# Patient Record
Sex: Male | Born: 1945
Health system: Southern US, Community
[De-identification: ages and names within clinical notes are randomized; demographics above are authoritative.]

## PROBLEM LIST (undated history)

## (undated) DIAGNOSIS — I619 Nontraumatic intracerebral hemorrhage, unspecified: Secondary | ICD-10-CM

## (undated) DIAGNOSIS — N183 Chronic kidney disease, stage 3 unspecified: Secondary | ICD-10-CM

## (undated) DIAGNOSIS — E785 Hyperlipidemia, unspecified: Secondary | ICD-10-CM

## (undated) DIAGNOSIS — D696 Thrombocytopenia, unspecified: Secondary | ICD-10-CM

## (undated) DIAGNOSIS — I10 Essential (primary) hypertension: Secondary | ICD-10-CM

## (undated) DIAGNOSIS — R002 Palpitations: Secondary | ICD-10-CM

## (undated) DIAGNOSIS — R011 Cardiac murmur, unspecified: Secondary | ICD-10-CM

## (undated) HISTORY — DX: Hyperlipidemia, unspecified: E78.5

## (undated) HISTORY — DX: Thrombocytopenia, unspecified: D69.6

## (undated) HISTORY — DX: Palpitations: R00.2

## (undated) HISTORY — DX: Chronic kidney disease, stage 3 unspecified: N18.30

## (undated) HISTORY — DX: Cardiac murmur, unspecified: R01.1

## (undated) HISTORY — DX: Essential (primary) hypertension: I10

## (undated) HISTORY — DX: Nontraumatic intracerebral hemorrhage, unspecified: I61.9

## (undated) HISTORY — PX: COLONOSCOPY: SHX174

---

## 1981-03-22 HISTORY — PX: LAPAROSCOPIC APPENDECTOMY: SUR753

## 1996-08-19 ENCOUNTER — Encounter: Payer: Self-pay | Admitting: Family Medicine

## 1996-08-19 LAB — CONVERTED CEMR LAB: PSA: 0.9 ng/mL

## 1998-05-21 ENCOUNTER — Encounter: Payer: Self-pay | Admitting: Family Medicine

## 1998-05-21 LAB — CONVERTED CEMR LAB: PSA: 1 ng/mL

## 1999-09-20 ENCOUNTER — Encounter: Payer: Self-pay | Admitting: Family Medicine

## 1999-09-20 LAB — CONVERTED CEMR LAB: PSA: 0.7 ng/mL

## 2000-09-19 ENCOUNTER — Encounter: Payer: Self-pay | Admitting: Family Medicine

## 2000-09-19 LAB — CONVERTED CEMR LAB: PSA: 0.6 ng/mL

## 2001-01-10 ENCOUNTER — Emergency Department (HOSPITAL_COMMUNITY): Admission: EM | Admit: 2001-01-10 | Discharge: 2001-01-10 | Payer: Self-pay | Admitting: Emergency Medicine

## 2001-01-10 ENCOUNTER — Encounter: Payer: Self-pay | Admitting: Emergency Medicine

## 2001-03-20 ENCOUNTER — Encounter: Payer: Self-pay | Admitting: Cardiology

## 2001-03-20 ENCOUNTER — Ambulatory Visit (HOSPITAL_COMMUNITY): Admission: RE | Admit: 2001-03-20 | Discharge: 2001-03-20 | Payer: Self-pay | Admitting: Cardiology

## 2001-03-20 HISTORY — PX: CARDIAC CATHETERIZATION: SHX172

## 2002-11-20 ENCOUNTER — Encounter: Payer: Self-pay | Admitting: Family Medicine

## 2002-11-20 LAB — CONVERTED CEMR LAB: PSA: 0.5 ng/mL

## 2003-07-13 LAB — HM COLONOSCOPY

## 2005-08-02 ENCOUNTER — Ambulatory Visit: Payer: Self-pay | Admitting: Family Medicine

## 2005-12-26 ENCOUNTER — Ambulatory Visit: Payer: Self-pay | Admitting: Family Medicine

## 2005-12-26 LAB — CONVERTED CEMR LAB: PSA: 0.84 ng/mL

## 2006-01-01 ENCOUNTER — Ambulatory Visit: Payer: Self-pay | Admitting: Family Medicine

## 2006-02-01 ENCOUNTER — Ambulatory Visit: Payer: Self-pay | Admitting: Family Medicine

## 2006-04-09 HISTORY — PX: INGUINAL HERNIA REPAIR: SHX194

## 2006-05-28 ENCOUNTER — Ambulatory Visit: Payer: Self-pay | Admitting: Family Medicine

## 2006-08-27 ENCOUNTER — Ambulatory Visit: Payer: Self-pay | Admitting: Family Medicine

## 2006-10-21 LAB — HM SIGMOIDOSCOPY

## 2007-01-08 ENCOUNTER — Encounter: Payer: Self-pay | Admitting: Family Medicine

## 2007-01-08 DIAGNOSIS — E781 Pure hyperglyceridemia: Secondary | ICD-10-CM

## 2007-01-08 DIAGNOSIS — K5190 Ulcerative colitis, unspecified, without complications: Secondary | ICD-10-CM | POA: Insufficient documentation

## 2007-01-09 ENCOUNTER — Ambulatory Visit: Payer: Self-pay | Admitting: Family Medicine

## 2007-01-09 LAB — CONVERTED CEMR LAB
ALT: 20 units/L (ref 0–53)
AST: 21 units/L (ref 0–37)
Albumin: 3.8 g/dL (ref 3.5–5.2)
Alkaline Phosphatase: 72 units/L (ref 39–117)
BUN: 13 mg/dL (ref 6–23)
Bilirubin, Direct: 0.2 mg/dL (ref 0.0–0.3)
CO2: 28 meq/L (ref 19–32)
Calcium: 8.9 mg/dL (ref 8.4–10.5)
Chloride: 109 meq/L (ref 96–112)
Cholesterol: 140 mg/dL (ref 0–200)
Creatinine, Ser: 1.1 mg/dL (ref 0.4–1.5)
GFR calc Af Amer: 87 mL/min
GFR calc non Af Amer: 72 mL/min
Glucose, Bld: 103 mg/dL — ABNORMAL HIGH (ref 70–99)
HDL: 25.3 mg/dL — ABNORMAL LOW (ref >39.0)
LDL Cholesterol: 83 mg/dL (ref 0–99)
PSA: 2.04 ng/mL (ref 0.10–4.00)
Potassium: 3.8 meq/L (ref 3.5–5.1)
Sodium: 141 meq/L (ref 135–145)
TSH: 4.03 microintl units/mL (ref 0.35–5.50)
Total Bilirubin: 4.2 mg/dL — ABNORMAL HIGH (ref 0.3–1.2)
Total CHOL/HDL Ratio: 5.5
Total Protein: 6.7 g/dL (ref 6.0–8.3)
Triglycerides: 157 mg/dL — ABNORMAL HIGH (ref 0–149)
VLDL: 31 mg/dL (ref 0–40)

## 2007-01-14 ENCOUNTER — Ambulatory Visit: Payer: Self-pay | Admitting: Family Medicine

## 2007-01-24 ENCOUNTER — Ambulatory Visit: Payer: Self-pay | Admitting: Family Medicine

## 2007-01-27 ENCOUNTER — Encounter (INDEPENDENT_AMBULATORY_CARE_PROVIDER_SITE_OTHER): Payer: Self-pay | Admitting: *Deleted

## 2007-02-27 ENCOUNTER — Ambulatory Visit: Payer: Self-pay | Admitting: Family Medicine

## 2007-03-03 ENCOUNTER — Ambulatory Visit: Payer: Self-pay | Admitting: Family Medicine

## 2007-04-08 ENCOUNTER — Ambulatory Visit: Payer: Self-pay | Admitting: Family Medicine

## 2007-04-08 DIAGNOSIS — I10 Essential (primary) hypertension: Secondary | ICD-10-CM | POA: Insufficient documentation

## 2007-05-22 ENCOUNTER — Ambulatory Visit: Payer: Self-pay | Admitting: Family Medicine

## 2007-06-02 ENCOUNTER — Encounter: Payer: Self-pay | Admitting: Family Medicine

## 2007-12-23 ENCOUNTER — Ambulatory Visit: Payer: Self-pay | Admitting: Family Medicine

## 2007-12-23 LAB — CONVERTED CEMR LAB
ALT: 18 units/L (ref 0–53)
AST: 21 units/L (ref 0–37)
Albumin: 3.7 g/dL (ref 3.5–5.2)
Alkaline Phosphatase: 60 units/L (ref 39–117)
BUN: 12 mg/dL (ref 6–23)
Basophils Absolute: 0 10*3/uL (ref 0.0–0.1)
Basophils Relative: 0.1 % (ref 0.0–3.0)
Bilirubin, Direct: 0.2 mg/dL (ref 0.0–0.3)
CO2: 31 meq/L (ref 19–32)
Calcium: 8.5 mg/dL (ref 8.4–10.5)
Chloride: 104 meq/L (ref 96–112)
Cholesterol: 151 mg/dL (ref 0–200)
Creatinine, Ser: 1.1 mg/dL (ref 0.4–1.5)
Direct LDL: 66.2 mg/dL
Eosinophils Absolute: 0.2 10*3/uL (ref 0.0–0.7)
Eosinophils Relative: 2.8 % (ref 0.0–5.0)
GFR calc Af Amer: 87 mL/min
GFR calc non Af Amer: 72 mL/min
Glucose, Bld: 82 mg/dL (ref 70–99)
HCT: 44.4 % (ref 39.0–52.0)
HDL: 25.4 mg/dL — ABNORMAL LOW (ref >39.0)
Hemoglobin: 15.8 g/dL (ref 13.0–17.0)
Lymphocytes Relative: 17.1 % (ref 12.0–46.0)
MCHC: 35.7 g/dL (ref 30.0–36.0)
MCV: 90.2 fL (ref 78.0–100.0)
Monocytes Absolute: 0.6 10*3/uL (ref 0.1–1.0)
Monocytes Relative: 9.3 % (ref 3.0–12.0)
Neutro Abs: 4.3 10*3/uL (ref 1.4–7.7)
Neutrophils Relative %: 70.7 % (ref 43.0–77.0)
PSA: 1.17 ng/mL (ref 0.10–4.00)
Platelets: 136 10*3/uL — ABNORMAL LOW (ref 150–400)
Potassium: 3.9 meq/L (ref 3.5–5.1)
RBC: 4.92 M/uL (ref 4.22–5.81)
RDW: 12.8 % (ref 11.5–14.6)
Sodium: 140 meq/L (ref 135–145)
TSH: 5.45 microintl units/mL (ref 0.35–5.50)
Total Bilirubin: 2.7 mg/dL — ABNORMAL HIGH (ref 0.3–1.2)
Total CHOL/HDL Ratio: 5.9
Total Protein: 6.6 g/dL (ref 6.0–8.3)
Triglycerides: 289 mg/dL (ref 0–149)
VLDL: 58 mg/dL — ABNORMAL HIGH (ref 0–40)
WBC: 6.2 10*3/uL (ref 4.5–10.5)

## 2007-12-29 ENCOUNTER — Ambulatory Visit: Payer: Self-pay | Admitting: Family Medicine

## 2008-02-10 ENCOUNTER — Encounter: Payer: Self-pay | Admitting: Family Medicine

## 2008-11-18 ENCOUNTER — Telehealth: Payer: Self-pay | Admitting: Family Medicine

## 2008-12-29 ENCOUNTER — Ambulatory Visit: Payer: Self-pay | Admitting: Family Medicine

## 2008-12-30 LAB — CONVERTED CEMR LAB
ALT: 28 units/L (ref 0–53)
BUN: 12 mg/dL (ref 6–23)
CO2: 28 meq/L (ref 19–32)
Chloride: 105 meq/L (ref 96–112)
Cholesterol: 135 mg/dL (ref 0–200)
Creatinine, Ser: 1 mg/dL (ref 0.4–1.5)
Direct LDL: 64.2 mg/dL
Eosinophils Absolute: 0.2 10*3/uL (ref 0.0–0.7)
Eosinophils Relative: 4.1 % (ref 0.0–5.0)
Glucose, Bld: 98 mg/dL (ref 70–99)
HCT: 42.6 % (ref 39.0–52.0)
Lymphs Abs: 1.1 10*3/uL (ref 0.7–4.0)
MCHC: 34.7 g/dL (ref 30.0–36.0)
MCV: 92 fL (ref 78.0–100.0)
Monocytes Absolute: 0.4 10*3/uL (ref 0.1–1.0)
PSA: 0.94 ng/mL (ref 0.10–4.00)
Platelets: 135 10*3/uL — ABNORMAL LOW (ref 150.0–400.0)
Potassium: 4.1 meq/L (ref 3.5–5.1)
TSH: 2.55 microintl units/mL (ref 0.35–5.50)
Total Bilirubin: 1.9 mg/dL — ABNORMAL HIGH (ref 0.3–1.2)
Total Protein: 6.3 g/dL (ref 6.0–8.3)
WBC: 5.1 10*3/uL (ref 4.5–10.5)

## 2009-01-21 ENCOUNTER — Ambulatory Visit: Payer: Self-pay | Admitting: Family Medicine

## 2009-01-21 LAB — CONVERTED CEMR LAB: OCCULT 2: NEGATIVE

## 2009-01-24 ENCOUNTER — Encounter (INDEPENDENT_AMBULATORY_CARE_PROVIDER_SITE_OTHER): Payer: Self-pay | Admitting: *Deleted

## 2009-09-27 ENCOUNTER — Encounter (INDEPENDENT_AMBULATORY_CARE_PROVIDER_SITE_OTHER): Payer: Self-pay | Admitting: *Deleted

## 2009-12-23 ENCOUNTER — Telehealth: Payer: Self-pay | Admitting: Family Medicine

## 2009-12-30 ENCOUNTER — Encounter: Payer: Self-pay | Admitting: Family Medicine

## 2010-01-05 ENCOUNTER — Encounter: Payer: Self-pay | Admitting: Family Medicine

## 2010-01-05 ENCOUNTER — Ambulatory Visit: Payer: Self-pay | Admitting: Family Medicine

## 2010-01-05 DIAGNOSIS — R0789 Other chest pain: Secondary | ICD-10-CM

## 2010-01-05 DIAGNOSIS — R079 Chest pain, unspecified: Secondary | ICD-10-CM | POA: Insufficient documentation

## 2010-02-09 ENCOUNTER — Ambulatory Visit: Payer: Self-pay | Admitting: Family Medicine

## 2010-03-21 NOTE — Assessment & Plan Note (Signed)
Summary: CPX/CLE   Vital Signs:  Patient profile:   65 year old male Height:      67.5 inches Weight:      176.25 pounds BMI:     27.30 Temp:     97.5 degrees F oral Pulse rate:   60 / minute Pulse rhythm:   regular BP sitting:   122 / 82  (left arm) Cuff size:   regular  Vitals Entered By: Sydell Axon LPN (January 05, 2010 8:26 AM) CC: 30 Minute checkup   History of Present Illness: Pt here for Comp Exam. He feels well except his breathing is painful. It is not particularly associated with exertion , he does not think altho he hasn't been doing much lately, and is definitely worse with lying down. He denies wheezing, coughing. he feels some discomfort sitting in front of me now. He ran into the office this morning due tpo being late and had no apparent symptoms. he has rhinitis in the AM, otherwise no sneezing, itchiness, cough. These sxs have been going on for a few months. He had stopoped his regular exercise earlier due to being busy with work, home, remodelling, marriage, etc. Chest discomfort was noticed later and again more intense at night while laying down.  Preventive Screening-Counseling & Management  Alcohol-Tobacco     Alcohol drinks/day: 0     Smoking Status: never     Passive Smoke Exposure: no  Caffeine-Diet-Exercise     Caffeine use/day: 0     Does Patient Exercise: no     Type of exercise: walks, exercises.     Times/week: 1  Problems Prior to Update: 1)  Hypertension, Benign Essential  (ICD-401.1) 2)  Special Screening Malig Neoplasms Other Sites  (ICD-V76.49) 3)  Health Maintenance Exam  (ICD-V70.0) 4)  Cancer, Family Hx  (ICD-V16.9) 5)  Hypertriglyceridemia  (ICD-272.1) 6)  Psa, Increased  (ICD-790.93) 7)  Prostatitis, Hx Of, 03/84 --02/83  (ICD-V13.09) 8)  Ulcerative Colitis  (ICD-556.9) 9)  Gilbert's Syndrome  (ICD-277.4) 10)  Liver Function Tests, Abnormal, Hx of  (ICD-V12.2)  Medications Prior to Update: 1)  Coreg 6.25 Mg Tabs (Carvedilol)  .... One Tab By Mouth Two Times A Day 2)  Niaspan 500 Mg Cr-Tabs (Niacin (Antihyperlipidemic)) .... One Tab By Mouth At Night With 81mg  Easa 1/2 Hr Prior  Current Medications (verified): 1)  Coreg 6.25 Mg Tabs (Carvedilol) .... One Tab By Mouth Two Times A Day 2)  Excedrin Extra Strength 250-250-65 Mg Tabs (Aspirin-Acetaminophen-Caffeine) .... As Needed 3)  Coq10 100 Mg Caps (Coenzyme Q10) .... Take One By Mouth Daily 4)  Vitamin C 1000 Mg Tabs (Ascorbic Acid) .... Take One By Mouth Daily  Allergies: 1)  ! Sulfa 2)  ! Amoxicillin (Amoxicillin) 3)  ! Morphine Sulfate Cr (Morphine Sulfate)  Past History:  Past Surgical History: Last updated: 12/29/2007 Episode of Ulcerative Colitis 5/81 Remission for years Appendectomy (exp. Lap) 03/1981 Flex sig. 09/98, 05/81. 10/97 Cardiolite EF 55% 02/10/02 CATH  03/20/01 Colonoscopy, int & Ext hemms  (Dr Corinda Gubler) 07/13/03     5 years Bilat Lap inguinal repair 04/09/06  Family History: Last updated: Jan 13, 2007 Father: Died at age 1 of a stroke Mother: Died at age of 52 with gynecological surgery, presumably cancer Siblings: 5 brothers; the oldest has had a catheterization, but otherwise is alive and well.  Two sisters are alive and well.  Social History: Last updated: 01/05/2010 Marital Status: Wife left him...  Remarried Jun 18, 2008 Children: One son Occupation: Is  in the heating and air conditioning business  Risk Factors: Alcohol Use: 0 (01/05/2010) Caffeine Use: 0 (01/05/2010) Exercise: no (01/05/2010)  Risk Factors: Smoking Status: never (01/05/2010) Passive Smoke Exposure: no (01/05/2010)  Social History: Marital Status: Wife left him...  Remarried Jun 18, 2008 Children: One son Occupation: Is in the heating and air conditioning business  Review of Systems General:  Denies chills, fatigue, fever, sweats, weakness, and weight loss. Eyes:  Denies blurring, discharge, eye irritation, and eye pain. ENT:  Denies decreased  hearing, ear discharge, earache, and ringing in ears. CV:  Complains of chest pain or discomfort and difficulty breathing at night; denies fainting, fatigue, leg cramps with exertion, lightheadness, near fainting, palpitations, shortness of breath with exertion, swelling of feet, and swelling of hands. Resp:  Complains of chest discomfort and pleuritic; denies chest pain with inspiration, cough, coughing up blood, excessive snoring, hypersomnolence, morning headaches, shortness of breath, sputum productive, and wheezing. GI:  Complains of constipation and hemorrhoids; denies abdominal pain, bloody stools, change in bowel habits, dark tarry stools, diarrhea, indigestion, loss of appetite, nausea, vomiting, vomiting blood, and yellowish skin color. GU:  Denies discharge, dysuria, incontinence, nocturia, and urinary frequency. MS:  Denies joint pain, low back pain, muscle aches, cramps, and stiffness. Derm:  Denies dryness, itching, and rash. Neuro:  Denies numbness, poor balance, tingling, and tremors.  Physical Exam  General:  Well-developed,well-nourished,in no acute distress; alert,appropriate and cooperative throughout examination, relaxed and comfortable. Head:  Normocephalic and atraumatic without obvious abnormalities. No apparent alopecia but classic male pattern  balding. Eyes:  Conjunctiva clear bilaterally.  Ears:  External ear exam shows no significant lesions or deformities.  Otoscopic examination reveals clear canals, tympanic membranes are intact bilaterally without bulging, retraction, inflammation or discharge. Hearing is grossly normal bilaterally. Nose:  External nasal examination shows no deformity or inflammation. Nasal mucosa are pink and moist without lesions or exudates. Mouth:  Oral mucosa and oropharynx without lesions or exudates.  Teeth in good repair. Neck:  No deformities, masses, or tenderness noted. Chest Wall:  No deformities, masses, tenderness or gynecomastia  noted. Breasts:  No masses or gynecomastia noted Lungs:  Normal respiratory effort, chest expands symmetrically. Lungs are clear to auscultation, no crackles or wheezes. Good air flow apparent. Heart:  Normal rate and regular rhythm. S1 and S2 normal without gallop, murmur, click, rub or other extra sounds. Abdomen:  Bowel sounds positive,abdomen soft and non-tender without masses, organomegaly or hernias noted. Rectal:  No external abnormalities noted. Normal sphincter tone. No rectal masses or tenderness. Deflated ext hemms circumf. G neg. Genitalia:  Testes bilaterally descended without nodularity, tenderness or masses. No scrotal masses or lesions. No penis lesions or urethral discharge. Prostate:  Prostate gland firm and smooth, no enlargement, nodularity, tenderness, mass, asymmetry or induration. 10-20 gms. Msk:  No deformity or scoliosis noted of thoracic or lumbar spine.   Pulses:  R and L carotid,radial,femoral,dorsalis pedis and posterior tibial pulses are full and equal bilaterally Extremities:  No clubbing, cyanosis, edema, or deformity noted with normal full range of motion of all joints.   Neurologic:  No cranial nerve deficits noted. Station and gait are normal. Sensory, motor and coordinative functions appear intact. Skin:  Intact without suspicious lesions or rashes. multiple benign moles and hemangiomas on trunk. Cervical Nodes:  No lymphadenopathy noted Inguinal Nodes:  No significant adenopathy Psych:  Cognition and judgment appear intact. Alert and cooperative with normal attention span and concentration. No apparent delusions, illusions, hallucinations   Impression &  Recommendations:  Problem # 1:  HEALTH MAINTENANCE EXAM (ICD-V70.0)  Reviewed preventive care protocols, scheduled due services, and updated immunizations. Will give pneumovax today and discussed Zostavax.  Problem # 2:  CHEST DISCOMFORT (GUY-403.47) Assessment: New Sounds pulmonary initially not  cardiac. EKG signif for bradycardia and inverted T in III, unable to compare with old EKG as it is faded. Lungs sound perfectly nml with good air exchange. CXR does not seem important at this point. Possibly get one in the future. Will decrease Coreg to 1/2 two times a day to see if increasing heartrate may help. Will see back in one month. Orders: EKG w/ Interpretation (93000)  Problem # 3:  HYPERTENSION, BENIGN ESSENTIAL (ICD-401.1) Assessment: Improved Cont curr meds. His updated medication list for this problem includes:    Coreg 6.25 Mg Tabs (Carvedilol) ..... One tab by mouth two times a day  BP today: 122/82 Prior BP: 150/84 (12/29/2008)  Labs Reviewed: K+: 4.1 (12/29/2008) Creat: : 1.0 (12/29/2008)   Chol: 135 (12/29/2008)   HDL: 26.40 (12/29/2008)   LDL: DEL (12/23/2007)   TG: 219.0 (12/29/2008)  Problem # 4:  HYPERTRIGLYCERIDEMIA (ICD-272.1) Assessment: Unchanged  Continues...discussed avoiding sweets and carbs. Hasn't been doing this lately. The following medications were removed from the medication list:    Niaspan 500 Mg Cr-tabs (Niacin (antihyperlipidemic)) ..... One tab by mouth at night with 81mg  easa 1/2 hr prior  Labs Reviewed: SGOT: 25 (12/29/2008)   SGPT: 28 (12/29/2008)   HDL:26.40 (12/29/2008), 25.4 (12/23/2007)  LDL:DEL (12/23/2007), 83 (42/59/5638)  Chol:135 (12/29/2008), 151 (12/23/2007)  Trig:219.0 (12/29/2008), 289 (12/23/2007)  Problem # 5:  PSA, INCREASED (ICD-790.93) Assessment: Improved Nml this year.  Problem # 6:  ULCERATIVE COLITIS (ICD-556.9) Assessment: Unchanged Stable, actually constipated at this point. Discussed intake.  Problem # 7:  GILBERT'S SYNDROME (ICD-277.4) Assessment: Unchanged Stable.  Complete Medication List: 1)  Coreg 6.25 Mg Tabs (Carvedilol) .... One tab by mouth two times a day 2)  Excedrin Extra Strength 250-250-65 Mg Tabs (Aspirin-acetaminophen-caffeine) .... As needed 3)  Coq10 100 Mg Caps (Coenzyme q10) .... Take  one by mouth daily 4)  Vitamin C 1000 Mg Tabs (Ascorbic acid) .... Take one by mouth daily  Patient Instructions: 1)  Take 1/2 Coreg for one month and then return for reassessment. 2)  RTC one month. Check breathing and chest pressure.  3)  Also check bowel function. Prescriptions: COREG 6.25 MG TABS (CARVEDILOL) one tab by mouth two times a day  #180 x 3   Entered and Authorized by:   Shaune Leeks MD   Signed by:   Shaune Leeks MD on 01/05/2010   Method used:   Print then Give to Patient   RxID:   [id number] COREG 6.25 MG TABS (CARVEDILOL) one tab by mouth two times a day  #180 x 3   Entered and Authorized by:   Shaune Leeks MD   Signed by:   Shaune Leeks MD on 01/05/2010   Method used:   Electronically to        [facility] [id number]       [address]       [place], [place]  [address]       Ph: [phone]       Fax: [phone]   RxID:   [id number]    Orders Added: 1)  EKG w/ Interpretation [93000] 2)  Est. Patient 65& > [99397] 3)  Est. Patient Level III [[address]]    Current Allergies (reviewed today): ! SULFA ! AMOXICILLIN (AMOXICILLIN) ! MORPHINE SULFATE CR (MORPHINE SULFATE)  Appended Document: CPX/CLE Flu Vaccine Consent Questions     Do you have a history of severe allergic reactions to this vaccine? no    Any prior history of allergic reactions to egg and/or gelatin? no    Do you have a sensitivity to the preservative Thimersol? no    Do you have a past history of Guillan-Barre Syndrome? no    Do you currently have an acute febrile illness? no    Have you ever had a severe reaction to latex? no    Vaccine information given and explained to patient? yes    Are you currently pregnant? no    Lot Number:[id number]   Exp Date:08/19/2010   Site Given  Left Deltoid IM   Pneumovax Vaccine    Vaccine Type: Pneumovax    Site: right deltoid    Mfr: Merck    Dose: 0.5 ml    Route:  IM    Given by: Sydell Axon LPN    Exp. Date: 06/16/2011    Lot #: [id number]    VIS given: 01/24/09 version given January 05, 2010.

## 2010-03-21 NOTE — Letter (Signed)
Summary: Results Follow up Letter  [facility]  [address] [place], [place] [address]   Phone: [phone]  Fax: [phone]    01/24/2009 MRN: [id number]    [name] [address] [place], [place]  [address]    Dear Billy Kim,  The following are the results of your recent test(s):  Test         Result    Pap Smear:        Normal _____  Not Normal _____ Comments: ______________________________________________________ Cholesterol: LDL(Bad cholesterol):         Your goal is less than:         HDL (Good cholesterol):       Your goal is more than: Comments:  ______________________________________________________ Mammogram:        Normal _____  Not Normal _____ Comments:  ___________________________________________________________________ Hemoccult:        Normal __X___  Not normal _______ Comments:  Yearly follow up is recommended.   _____________________________________________________________________ Other Tests:    We routinely do not discuss normal results over the telephone.  If you desire a copy of the results, or you have any questions about this information we can discuss them at your next office visit.   Sincerely,    Arta Silence, M.D.  RNS:lsf

## 2010-03-21 NOTE — Letter (Signed)
Summary: Nadara Eaton letter  [facility]  [address] [place], [place] [address]   Phone: [phone]  Fax: [phone]       09/27/2009 MRN: [id number]  [facility] [address] [place], [place]  [address]  Dear Mr. PALMERI,  [facility] [place], and [facility] announce the retirement of Arta Silence, M.D., from full-time practice at the [facility] office effective August 18, 2009 and his plans of returning part-time.  It is important to Dr. Hetty Ely and to our practice that you understand that [facility] has seven physicians in our office for your health care needs.  We will continue to offer the same exceptional care that you have today.    Dr. Hetty Ely has spoken to many of you about his plans for retirement and returning part-time in the fall.   We will continue to work with you through the transition to schedule appointments for you in the office and meet the high standards that [facility] is committed to.   Again, it is with great pleasure that we share the news that Dr. Hetty Ely will return to [facility] in October of 2011 with a reduced schedule.    If you have any questions, or would like to request an appointment with one of our physicians, please call us at [phone] and press the option for Scheduling an appointment.  We take pleasure in providing you with excellent patient care and look forward to seeing you at your next office visit.  Our [facility] Physicians are:  Tillman Abide, M.D. Laurita Quint, M.D. Roxy Manns, M.D. Kerby Nora, M.D. Hannah Beat, M.D. Ruthe Mannan, M.D. We proudly welcomed Raechel Ache, M.D. and Eustaquio Boyden, M.D. to the practice in July/August 2011.  Sincerely,  [facility]

## 2010-03-21 NOTE — Progress Notes (Signed)
Summary: wants written lab order for [facility]  Phone Note Call from Patient Call back at [phone]   Caller: Patient Call For: Shaune Leeks MD Summary of Call: Pt has CPX appt already scheduled for 01/05/10 with Dr Hetty Ely. Pt wants lab order written to take to a [facility] draw station prior to visit. Pt understands Dr Hetty Ely will not be back in office until 12/28/09. When lab order is ready for pick up please call pt at [phone]. Thank you.Lewanda Rife LPN  December 23, 2009 11:22 AM   Follow-up for Phone Call        In my out box. Follow-up by: Shaune Leeks MD,  December 28, 2009 7:11 AM  Additional Follow-up for Phone Call Additional follow up Details #1::        Left message on machine for patient to call back. Sydell Axon LPN  December 28, 2009 8:43 AM  Patient notified that rx is up front and ready for pickup. Additional Follow-up by: Sydell Axon LPN,  December 28, 2009 9:26 AM

## 2010-03-23 NOTE — Assessment & Plan Note (Signed)
Summary: 1 MONTH FOLLOW UP/[facility]   Vital Signs:  Patient profile:   65 year old male Weight:      177.75 pounds Temp:     98.7 degrees F oral Pulse rate:   64 / minute Pulse rhythm:   regular BP sitting:   122 / 80 Cuff size:   regular  Vitals Entered By: Sydell Axon LPN (February 09, 2010 8:20 AM) CC: One month follow-up   History of Present Illness: Pt here to recheck breathing which he complained last time being labored with laying down...his pulse was 60 when seen and I thought we were probably overcontrolling him so decreased his Coreg. Then wanted to see him to check BP as well. He is feeling better with some improvement in the breathing and chest pressure in bed. He also relates today again having some difficulty with regular bowel movements with needing a stool softener fairly often. He denies abd pain, N/V, etc.  Problems Prior to Update: 1)  Chest Discomfort  (ICD-786.59) 2)  Hypertension, Benign Essential  (ICD-401.1) 3)  Special Screening Malig Neoplasms Other Sites  (ICD-V76.49) 4)  Health Maintenance Exam  (ICD-V70.0) 5)  Cancer, Family Hx  (ICD-V16.9) 6)  Hypertriglyceridemia  (ICD-272.1) 7)  Ulcerative Colitis  (ICD-556.9) 8)  Gilbert's Syndrome  (ICD-277.4)  Medications Prior to Update: 1)  Coreg 6.25 Mg Tabs (Carvedilol) .... One Tab By Mouth Two Times A Day 2)  Excedrin Extra Strength 250-250-65 Mg Tabs (Aspirin-Acetaminophen-Caffeine) .... As Needed 3)  Coq10 100 Mg Caps (Coenzyme Q10) .... Take One By Mouth Daily 4)  Vitamin C 1000 Mg Tabs (Ascorbic Acid) .... Take One By Mouth Daily  Allergies: 1)  ! Sulfa 2)  ! Amoxicillin (Amoxicillin) 3)  ! Morphine Sulfate Cr (Morphine Sulfate)  Physical Exam  General:  Well-developed,well-nourished,in no acute distress; alert,appropriate and cooperative throughout examination, relaxed and comfortable. Head:  Normocephalic and atraumatic without obvious abnormalities. No apparent alopecia but classic male pattern   balding. Eyes:  Conjunctiva clear bilaterally.  Ears:  External ear exam shows no significant lesions or deformities.  Otoscopic examination reveals clear canals, tympanic membranes are intact bilaterally without bulging, retraction, inflammation or discharge. Hearing is grossly normal bilaterally. Nose:  External nasal examination shows no deformity or inflammation. Nasal mucosa are pink and moist without lesions or exudates. Mouth:  Oral mucosa and oropharynx without lesions or exudates.  Teeth in good repair. Neck:  No deformities, masses, or tenderness noted. Lungs:  Normal respiratory effort, chest expands symmetrically. Lungs are clear to auscultation, no crackles or wheezes. Good air flow apparent. Heart:  Normal rate and regular rhythm. S1 and S2 normal without gallop, murmur, click, rub or other extra sounds.   Impression & Recommendations:  Problem # 1:  CHEST DISCOMFORT (ICD-786.59) Assessment Improved Better, cont lower dose Coreg.  Problem # 2:  HYPERTENSION, BENIGN ESSENTIAL (ICD-401.1) Assessment: Unchanged Stable on lower dose. His updated medication list for this problem includes:    Coreg 3.125 Mg Tabs (Carvedilol) ..... One tab by mouth two times a day  BP today: 122/80 Prior BP: 122/82 (01/05/2010)  Labs Reviewed: K+: 4.1 (12/29/2008) Creat: : 1.0 (12/29/2008)   Chol: 135 (12/29/2008)   HDL: 26.40 (12/29/2008)   LDL: DEL (12/23/2007)   TG: 219.0 (12/29/2008)  Problem # 3:  ULCERATIVE COLITIS (ICD-556.9) Assessment: Unchanged Start Citrucel regularly.  Complete Medication List: 1)  Coreg 3.125 Mg Tabs (Carvedilol) .... One tab by mouth two times a day 2)  Excedrin Extra Strength [phone] Mg  Tabs (Aspirin-acetaminophen-caffeine) .... As needed 3)  Coq10 100 Mg Caps (Coenzyme q10) .... Take one by mouth daily 4)  Vitamin C 1000 Mg Tabs (Ascorbic acid) .... Take one by mouth daily  Patient Instructions: 1)  RTC 3 mos for BP, breathing  and bowel check 2)   Cnx [address] script for Coreg. 3)  Start Citrucel 1 tsp in 8 oz water every AM, first thing. Prescriptions: COREG 3.125 MG TABS (CARVEDILOL) one tab by mouth two times a day  #180 x 3   Entered and Authorized by:   Shaune Leeks MD   Signed by:   Shaune Leeks MD on 02/09/2010   Method used:   Print then Give to Patient   RxID:   [id number] COREG 3.125 MG TABS (CARVEDILOL) one tab by mouth two times a day  #60 x 12   Entered and Authorized by:   Shaune Leeks MD   Signed by:   Shaune Leeks MD on 02/09/2010   Method used:   [facility]  [address] [id number]       [address]       [place], [place]  [address]       Ph: [phone]       Fax: [phone]   RxID:   [id number]    Orders Added: 1)  Est. Patient Level III [956[address]]    Current Allergies (reviewed today): ! SULFA ! AMOXICILLIN (AMOXICILLIN) ! MORPHINE SULFATE CR (MORPHINE SULFATE)

## 2010-03-28 ENCOUNTER — Encounter: Payer: Self-pay | Admitting: Family Medicine

## 2010-05-17 ENCOUNTER — Ambulatory Visit (INDEPENDENT_AMBULATORY_CARE_PROVIDER_SITE_OTHER): Payer: 59 | Admitting: Family Medicine

## 2010-05-17 ENCOUNTER — Encounter: Payer: Self-pay | Admitting: Family Medicine

## 2010-05-17 VITALS — BP 150/100 | HR 60 | Temp 98.4°F | Ht 67.75 in | Wt 179.8 lb

## 2010-05-17 DIAGNOSIS — K5190 Ulcerative colitis, unspecified, without complications: Secondary | ICD-10-CM

## 2010-05-17 DIAGNOSIS — I10 Essential (primary) hypertension: Secondary | ICD-10-CM

## 2010-05-17 MED ORDER — LISINOPRIL 10 MG PO TABS: 10.0000 mg | ORAL_TABLET | Freq: Every day | ORAL | Status: DC

## 2010-05-17 NOTE — Patient Instructions (Signed)
RTC 3 mos for BP check.  Labs 6 weeks and 3 mos, BMET 401.9

## 2010-05-17 NOTE — Assessment & Plan Note (Signed)
Pt's pressure high today. Will add Lisinopril 10mg , 1/2 tab daily for two weeks and then one a day.

## 2010-05-17 NOTE — Assessment & Plan Note (Signed)
BMs and constipation better. Not taking Citrucel daily because of forgetfulness. Encouraged to take daily.

## 2010-05-17 NOTE — Progress Notes (Signed)
  Subjective:    Patient ID: Billy Kim, male    DOB: 1945-12-31, 65 y.o.   MRN: [id number]  HPI Pt here to recheck  BP and breathing. He also was counseled to start Citrucel regularly in the AM to improve regularity and BMs. His breathing is better. He has forgotten some doses of Citrucel but sees improvement with what he has remembered.    Review of Systems  Constitutional: Negative for fever, chills, diaphoresis, activity change, appetite change and fatigue.  HENT: Negative for hearing loss, ear pain, congestion, sore throat, rhinorrhea, neck pain, neck stiffness, postnasal drip, sinus pressure, tinnitus and ear discharge.   Eyes: Negative for pain, discharge and visual disturbance.  Respiratory: Negative for cough, shortness of breath and wheezing.   Cardiovascular: Negative for chest pain and palpitations.       [No SOB w/ exertion Gastrointestinal: Negative for constipation (improved.) and abdominal distention.  [all other systems reviewed and are negative       Objective:   Physical Exam  Constitutional: He appears well-developed and well-nourished. No distress.  HENT:  Head: Normocephalic and atraumatic.  Right Ear: External ear normal.  Left Ear: External ear normal.  Nose: Nose normal.  Mouth/Throat: Oropharynx is clear and moist.  Eyes: Conjunctivae and EOM are normal. Pupils are equal, round, and reactive to light. Right eye exhibits no discharge. Left eye exhibits no discharge.  Neck: Normal range of motion. Neck supple.  Cardiovascular: Normal rate and regular rhythm.   Pulmonary/Chest: Effort normal and breath sounds normal. He has no wheezes.  Lymphadenopathy:    He has no cervical adenopathy.  Skin: He is not diaphoretic.          Assessment & Plan:

## 2010-07-07 NOTE — Letter (Signed)
January 01, 2006    Billy Sportsman, MD  [address] [place] [address]   RE:  Billy Kim, Billy Kim  MRN:  [id number]  /  DOB:  [birth year]   Dear Billy Kim:   This is to introduce Billy Kim, a 65 year old, very healthy white male  on no medications, who has what appears to be a left direct inguinal hernia  through the abdominal wall just to the superior medial side of the inguinal  ring. The patient has had discomfort for some time now. He feels a bulge  fairly routinely which he is able to reduce on his own after seeing me  previously. It used to happen very infrequently and now happens one or two  times a week, no matter what he is doing. He gets discomfort and then  reduces it.  I felt that he probably ought to be evaluated and possibly  rectified.   His past medical history is significant only for elevated triglycerides,  which he has controlled reasonably well at this point, an elevated PSA of 94  which has normalized now, and a history of elevated liver functions which  now are shown only as a bilirubin with Gilbert's syndrome. He also has a  history of ulcerative colitis followed by Dr. Victorino Kim.   He is allergic to SULFA, AMOXICILLIN and MORPHINE.   On exam today, his physical exam today was done with no problems, consistent  with a mild bulge in the left inguinal area as above.   I appreciate your seeing him and look forward to your evaluation.    Sincerely,      Billy Silence, MD  Electronically Signed    RNS/MedQ  DD: 01/01/2006  DT: 01/02/2006  Job #: [id number]

## 2010-07-07 NOTE — Cardiovascular Report (Signed)
[place]  Patient:    Billy Kim, Billy Kim Visit Number: [id number] MRN: [id number]          Service Type: CAT Location: [facility] 2857 01 Attending Physician:  Silvestre Mesi Dictated by:   Aram Candela Aleen Campi, M.D. Proc. Date: 03/20/01 Admit Date:  03/20/2001 Discharge Date: 03/20/2001   CC:         Laurita Quint, M.D.   Cardiac Catheterization  REFERRING PHYSICIAN:  Laurita Quint, M.D.  PROCEDURE: 1. Left heart catheterization. 2. Coronary cineangiography. 3. Left ventricular cineangiography. 4. Abdominal aortogram. 5. Perclose of the right femoral artery.  INDICATIONS:  This 65 year old male was referred for evaluation of recent onset of chest pain and a treadmill done in the office caused pain in his left elbow and a Cardiolite study done showed evidence for ischemia in his inferior myocardium.  He was then scheduled for cardiac catheterization.  He also has a remote history of elevated blood pressure.  DESCRIPTION OF PROCEDURE:  After signing an informed consent, the patient premedicated with 50 mg of Benadryl intravenously and brought to the cardiac catheterization lab.  His right groin was prepped and draped in the usual sterile fashion and anesthetized locally with 1% lidocaine.  A 6 French introducer sheath was inserted percutaneously into the right femoral artery. A 6 French #4 Judkins coronary catheters were used to make injections into the coronary arteries.  A 6 French pigtail catheter was used to measure pressures in the left ventricle and aorta and to make midstream injections into the left ventricle and abdominal aorta.  The patient tolerated the procedure well and no complications were noted.  At the end of the procedure, the catheter and sheath were removed from the right femoral artery and hemostasis was easily obtained with a Perclose closure system.  MEDICATIONS:  None.  HEMODYNAMIC DATA:  Left ventricular pressure  154/5-17, aortic pressure 153/81 with a mean of 108.  Left ventricular ejection fraction was measured at 74%.  CINE FINDINGS:  Left coronary artery.  The ostium and left main appear normal.  Left anterior descending appears normal.  Circumflex coronary artery is a large, dominant vessel which supplies the posterior descending and posterlateral branches to the left ventricle.  It appears normal throughout its proximal and middle segment.  There is a short area of ectasia in the distal segment just prior to the takeoff of the posterolateral branches and causes a mild decrease in antegrade flow with slower flow in the posterolateral and posterior descending branches.  There is no evidence for atherosclerotic plaque or narrowing in the vessel.  Right coronary artery.  The right coronary artery is a small nondominant vessel which terminates with right ventricular branches.  The right coronary artery appears normal.  Left ventricular cineangiogram.  The left ventricular chamber size, wall thickness, and contractility appear normal.  The ejection fraction was measured at 74%.  The mitral and aortic valves appear normal.  Abdominal aortogram.  The abdominal aorta and renal arteries appear normal.  FINAL DIAGNOSES: 1. 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 2. Normal coronary arteries otherwise with no evidence for significant    atherosclerotic plaque. 3. Normal left ventricular function. 4. Normal mitral and aortic valves. 5. Normal abdominal aorta and renal arteries. 6. Successful Perclose of the right femoral artery.  DISPOSITION:  Will transfer back  to the shortstay unit for further monitoring. Prior to discharge today, will give instructions to remain on aspirin as a preventative from clotting due to his mild ectasia in his distal  circumflex. Will follow up in the office in several weeks. Dictated by:   Aram Candela. Aleen Campi, M.D. Attending Physician:  Silvestre Mesi DD:  03/20/01 TD:  03/20/01 Job: [id number]

## 2010-07-21 ENCOUNTER — Encounter: Payer: Self-pay | Admitting: Family Medicine

## 2010-08-24 ENCOUNTER — Encounter: Payer: Self-pay | Admitting: Family Medicine

## 2010-08-24 ENCOUNTER — Ambulatory Visit (INDEPENDENT_AMBULATORY_CARE_PROVIDER_SITE_OTHER): Payer: 59 | Admitting: Family Medicine

## 2010-08-24 VITALS — BP 122/84 | HR 60 | Temp 97.7°F | Ht 67.75 in | Wt 174.8 lb

## 2010-08-24 DIAGNOSIS — I10 Essential (primary) hypertension: Secondary | ICD-10-CM

## 2010-08-24 MED ORDER — LISINOPRIL 10 MG PO TABS: 10.0000 mg | ORAL_TABLET | Freq: Every day | ORAL | Status: DC

## 2010-08-24 NOTE — Patient Instructions (Signed)
RTC 11/12 for Comp Exam, labs prior.

## 2010-08-24 NOTE — Progress Notes (Signed)
  Subjective:    Patient ID: Billy Kim, male    DOB: 02-02-1946, 65 y.o.   MRN: [id number]  HPI Pt here for followup of BP after adding Lisinopril. He has tolerated it welkl and seen no changes physically. He has no complaints today.     Review of Systems  Constitutional: Negative for fever, chills, diaphoresis, activity change, appetite change and fatigue.  HENT: Negative for hearing loss, ear pain, congestion, sore throat, rhinorrhea, neck pain, neck stiffness, postnasal drip, sinus pressure, tinnitus and ear discharge.   Eyes: Negative for pain, discharge and visual disturbance.  Respiratory: Negative for cough, shortness of breath and wheezing.   Cardiovascular: Negative for chest pain and palpitations.       No SOB w/ exertion  Gastrointestinal:       No heartburn or swallowing problems.  Genitourinary:       No nocturia  Skin:       No itching or dryness.  Neurological:       No tingling or balance problems.  All other systems reviewed and are negative.       Objective:   Physical Exam  Constitutional: He appears well-developed and well-nourished. No distress.  HENT:  Head: Normocephalic and atraumatic.  Right Ear: External ear normal.  Left Ear: External ear normal.  Nose: Nose normal.  Mouth/Throat: Oropharynx is clear and moist.  Eyes: Conjunctivae and EOM are normal. Pupils are equal, round, and reactive to light. Right eye exhibits no discharge. Left eye exhibits no discharge.  Neck: Normal range of motion. Neck supple.  Cardiovascular: Normal rate and regular rhythm.   Pulmonary/Chest: Effort normal and breath sounds normal. He has no wheezes.  Lymphadenopathy:    He has no cervical adenopathy.  Skin: He is not diaphoretic.          Assessment & Plan:

## 2010-08-24 NOTE — Assessment & Plan Note (Signed)
Good response. Cont curr meds.  Scripts written,

## 2010-12-27 ENCOUNTER — Ambulatory Visit (INDEPENDENT_AMBULATORY_CARE_PROVIDER_SITE_OTHER): Payer: 59 | Admitting: Family Medicine

## 2010-12-27 ENCOUNTER — Encounter: Payer: Self-pay | Admitting: Family Medicine

## 2010-12-27 DIAGNOSIS — D485 Neoplasm of uncertain behavior of skin: Secondary | ICD-10-CM | POA: Insufficient documentation

## 2010-12-27 DIAGNOSIS — R0789 Other chest pain: Secondary | ICD-10-CM

## 2010-12-27 DIAGNOSIS — K5190 Ulcerative colitis, unspecified, without complications: Secondary | ICD-10-CM

## 2010-12-27 DIAGNOSIS — E781 Pure hyperglyceridemia: Secondary | ICD-10-CM

## 2010-12-27 DIAGNOSIS — I10 Essential (primary) hypertension: Secondary | ICD-10-CM

## 2010-12-27 MED ORDER — TADALAFIL 5 MG PO TABS: 5.0000 mg | ORAL_TABLET | Freq: Every day | ORAL | Status: AC | PRN

## 2010-12-27 NOTE — Assessment & Plan Note (Signed)
Adequate in the pasty. Not checked this year. Needs check again next year. Lab Results  Component Value Date   CHOL 135 12/29/2008   HDL 26.40* 12/29/2008   LDLCALC 83 01/09/2007   LDLDIRECT 64.2 12/29/2008   TRIG 219.0* 12/29/2008   CHOLHDL 5 12/29/2008

## 2010-12-27 NOTE — Assessment & Plan Note (Signed)
Left ear lesion needs attention of dermatologist. He'll schedule.

## 2010-12-27 NOTE — Patient Instructions (Addendum)
RTC one year for Comp Exam, labs prior with Dr Para March.  Possibly needs colonoscopy next year. Look into Zostavax.  See dermatology for left ear lesion.

## 2010-12-27 NOTE — Assessment & Plan Note (Signed)
Will need GI followup next year and probable colonoscopy. Is stable and has been for some time.

## 2010-12-27 NOTE — Progress Notes (Signed)
  Subjective:    Patient ID: Billy Kim, male    DOB: 21-Jun-1945, 65 y.o.   MRN: [id number]  HPI Pt here for Comp Exam. He had lab work done in followup in May which was nml after starting ACEI. Hew had not had other lab work done since last PE last year. He has nml [facility] healthcare.  He feels well and has no complaints except his belly is getting bigger.     Review of Systems  Constitutional: Negative for fever, chills, diaphoresis, appetite change, fatigue and unexpected weight change.  HENT: Negative for hearing loss, ear pain, tinnitus and ear discharge.   Eyes: Negative for pain, discharge and visual disturbance.  Respiratory: Negative for cough, shortness of breath and wheezing.   Cardiovascular: Negative for chest pain and palpitations.       No SOB w/ exertion  Gastrointestinal: Positive for abdominal pain (minimal and doesn't long.). Negative for nausea, vomiting, diarrhea, constipation and blood in stool.       No heartburn or swallowing problems.  Genitourinary: Negative for dysuria, frequency and difficulty urinating.       No nocturia  Musculoskeletal: Negative for myalgias, back pain and arthralgias.  Skin: Negative for rash.       No itching or dryness.  Neurological: Negative for tremors and numbness.       No tingling or balance problems.  Hematological: Negative for adenopathy. Does not bruise/bleed easily.  Psychiatric/Behavioral: Negative for dysphoric mood and agitation.       Objective:   Physical Exam  Constitutional: He is oriented to person, place, and time. He appears well-developed and well-nourished. No distress.  HENT:  Head: Normocephalic and atraumatic.  Right Ear: External ear normal.  Left Ear: External ear normal.  Nose: Nose normal.  Mouth/Throat: Oropharynx is clear and moist.  Eyes: Conjunctivae and EOM are normal. Pupils are equal, round, and reactive to light. Right eye exhibits no discharge. Left eye exhibits no discharge. No scleral  icterus.  Neck: Normal range of motion. Neck supple. No thyromegaly present.  Cardiovascular: Normal rate, regular rhythm, normal heart sounds and intact distal pulses.   No murmur heard. Pulmonary/Chest: Effort normal and breath sounds normal. No respiratory distress. He has no wheezes.  Abdominal: Soft. Bowel sounds are normal. He exhibits no distension and no mass. There is no tenderness. There is no rebound and no guarding.  Genitourinary: Rectum normal, prostate normal and penis normal. Guaiac negative stool.       Rectal nml tone, no no mass. Stool soft and regular.  Musculoskeletal: Normal range of motion. He exhibits no edema.  Lymphadenopathy:    He has no cervical adenopathy.  Neurological: He is alert and oriented to person, place, and time. Coordination normal.  Skin: Skin is warm and dry. No rash noted. He is not diaphoretic.       Small 5mm umbilicated lesion on apex of left ear lobe.  Psychiatric: He has a normal mood and affect. His behavior is normal. Judgment and thought content normal.          Assessment & Plan:  HMPE

## 2010-12-27 NOTE — Assessment & Plan Note (Signed)
Has been stable. Cont to monitor.

## 2010-12-27 NOTE — Assessment & Plan Note (Signed)
Good control. Cont curr meds. BP: 124/74 mmHg

## 2010-12-27 NOTE — Assessment & Plan Note (Signed)
Non recently. Cont to follow.

## 2011-04-17 ENCOUNTER — Other Ambulatory Visit: Payer: Self-pay | Admitting: *Deleted

## 2011-04-17 MED ORDER — CARVEDILOL 3.125 MG PO TABS: 3.1250 mg | ORAL_TABLET | Freq: Two times a day (BID) | ORAL | Status: DC

## 2011-04-17 NOTE — Telephone Encounter (Signed)
Rx in your IN box. 

## 2011-04-17 NOTE — Telephone Encounter (Signed)
Patient would like Rx for 90 day supply mailed to his home address.

## 2011-04-17 NOTE — Telephone Encounter (Signed)
Signed, please send to pt.  Thanks.

## 2011-04-18 NOTE — Telephone Encounter (Signed)
Patient notified that script is ready and will be sent to his address. Patient stated that he would rather come by the office and pick it up.

## 2011-05-17 ENCOUNTER — Other Ambulatory Visit: Payer: Self-pay

## 2011-05-17 MED ORDER — CARVEDILOL 3.125 MG PO TABS: 3.1250 mg | ORAL_TABLET | Freq: Two times a day (BID) | ORAL | Status: DC

## 2011-05-17 NOTE — Telephone Encounter (Signed)
Pt request Carvedilol 3.125 mg #60 x 0 sent to [facility] while pt waits on mail order rx. Rx sent to [facility].

## 2011-05-21 ENCOUNTER — Telehealth: Payer: Self-pay | Admitting: Family Medicine

## 2011-05-21 NOTE — Telephone Encounter (Signed)
Call pt. Make sure this was resolved. Thanks.

## 2011-05-21 NOTE — Telephone Encounter (Signed)
Patient says issues are resolved.

## 2011-05-21 NOTE — Telephone Encounter (Signed)
Triage Record Num: [id number] Operator: Karenann Cai Patient Name: [facility] Call Date & Time: 05/18/2011 1:11:37PM Patient Phone: [phone] PCP: Crawford Givens Patient Gender: Male PCP Fax : Patient DOB: 01/15/1946 Practice Name: [facility] Reason for Call: Caller: Vinay/Pharmacist; PCP: Crawford Givens Clelia Croft); CB#: [phone]; call regarding medication issue; medication(s): BP med: Coreg. RN accessed EMR: Coreg 3.125 mg 1 po twice a day (#60 with NR). This prescription information shared with Pharmacist. Protocol(s) Used: Medication Questions - Adult Recommended Outcome per Protocol: Call Dispensing Pharmacy or Provider Immediately Reason for Outcome: Unable to obtain prescribed medication related to available resources AND situation poses immediate clinical risk Care Advice: ~ 05/18/2011 1:21:00PM Page 1 of 1 [email or web address]

## 2011-11-13 ENCOUNTER — Other Ambulatory Visit: Payer: Self-pay | Admitting: *Deleted

## 2011-11-13 DIAGNOSIS — I10 Essential (primary) hypertension: Secondary | ICD-10-CM

## 2011-11-13 MED ORDER — LISINOPRIL 10 MG PO TABS: 10.0000 mg | ORAL_TABLET | Freq: Every day | ORAL | Status: DC

## 2011-11-13 NOTE — Telephone Encounter (Signed)
Requesting Lisinopril Rx but it looks like it had been removed from the meds list.  Please advise.

## 2011-11-13 NOTE — Telephone Encounter (Signed)
LMOVM to call for CPE and labs.

## 2011-11-13 NOTE — Telephone Encounter (Signed)
Hetty Ely noted to continue.  Needs CPE this fall, labs ahead.  rx sent.

## 2011-11-15 ENCOUNTER — Other Ambulatory Visit: Payer: Self-pay | Admitting: *Deleted

## 2011-11-15 DIAGNOSIS — I10 Essential (primary) hypertension: Secondary | ICD-10-CM

## 2011-11-15 MED ORDER — LISINOPRIL 10 MG PO TABS: 10.0000 mg | ORAL_TABLET | Freq: Every day | ORAL | Status: DC

## 2011-11-21 ENCOUNTER — Telehealth: Payer: Self-pay | Admitting: Family Medicine

## 2011-11-21 NOTE — Telephone Encounter (Signed)
Patient has an appointment for a cpx on 12/28/11.  Patient has his labs done at [facility].  Can an order be faxed to patient,so he can have his lab work done before his appointment?

## 2011-11-21 NOTE — Telephone Encounter (Signed)
Print a copy of this note and send to patient.  That should cover the order.  Thanks.   Cmet/lipid- dx 401.1 psa- dx v76.44

## 2011-11-22 ENCOUNTER — Encounter: Payer: Self-pay | Admitting: *Deleted

## 2011-12-21 ENCOUNTER — Other Ambulatory Visit: Payer: 59

## 2011-12-25 ENCOUNTER — Encounter: Payer: Self-pay | Admitting: Family Medicine

## 2011-12-28 ENCOUNTER — Ambulatory Visit (INDEPENDENT_AMBULATORY_CARE_PROVIDER_SITE_OTHER): Payer: 59 | Admitting: Family Medicine

## 2011-12-28 ENCOUNTER — Encounter: Payer: Self-pay | Admitting: Family Medicine

## 2011-12-28 VITALS — BP 126/70 | HR 70 | Temp 98.3°F | Ht 68.0 in | Wt 177.0 lb

## 2011-12-28 DIAGNOSIS — Z23 Encounter for immunization: Secondary | ICD-10-CM

## 2011-12-28 DIAGNOSIS — E781 Pure hyperglyceridemia: Secondary | ICD-10-CM

## 2011-12-28 DIAGNOSIS — Z0000 Encounter for general adult medical examination without abnormal findings: Secondary | ICD-10-CM | POA: Insufficient documentation

## 2011-12-28 DIAGNOSIS — R109 Unspecified abdominal pain: Secondary | ICD-10-CM

## 2011-12-28 DIAGNOSIS — N529 Male erectile dysfunction, unspecified: Secondary | ICD-10-CM | POA: Insufficient documentation

## 2011-12-28 DIAGNOSIS — I10 Essential (primary) hypertension: Secondary | ICD-10-CM

## 2011-12-28 MED ORDER — SILDENAFIL CITRATE 100 MG PO TABS: 50.0000 mg | ORAL_TABLET | Freq: Every day | ORAL | Status: DC | PRN

## 2011-12-28 MED ORDER — CARVEDILOL 3.125 MG PO TABS: 3.1250 mg | ORAL_TABLET | Freq: Two times a day (BID) | ORAL | Status: DC

## 2011-12-28 MED ORDER — LISINOPRIL 10 MG PO TABS: 10.0000 mg | ORAL_TABLET | Freq: Every day | ORAL | Status: DC

## 2011-12-28 NOTE — Assessment & Plan Note (Signed)
Will try viagra with routine cautions and f/u prn.  He agrees.

## 2011-12-28 NOTE — Assessment & Plan Note (Signed)
Controlled, continue to work on diet and exercise.

## 2011-12-28 NOTE — Assessment & Plan Note (Signed)
Going on for 10 years, episodic, no blood in stool.  Reassuring exam.  Could be due to fasting status at time of sx.  Would follow, if worse notify clinic.  He agrees.

## 2011-12-28 NOTE — Assessment & Plan Note (Signed)
D/w pt, continue to work on diet and exercise.  

## 2011-12-28 NOTE — Progress Notes (Signed)
I have personally reviewed the Medicare Annual Wellness questionnaire and have noted 1. The patient's medical and social history 2. Their use of alcohol, tobacco or illicit drugs 3. Their current medications and supplements 4. The patient's functional ability including ADL's, fall risks, home safety risks and hearing or visual             impairment. 5. Diet and physical activities 6. Evidence for depression or mood disorders  The patients weight, height, BMI have been recorded in the chart and visual acuity is per eye clinic.  I have made referrals, counseling and provided education to the patient based review of the above and I have provided the pt with a written personalized care plan for preventive services.  See scanned forms.  Routine anticipatory guidance given to patient.  See health maintenance. Tetanus  2008 Flu 2013 Shingles encouraged PNA 2011 Colonoscopy 2005 Prostate cancer screening- PSA wnl Advance directive- as a living will.  Wife would be designated if incapacitated.   D/w pt about exercise for elevated TGs.   Occ pain under the L ribs.  Will happen and then remit, brief duration.  Worse if standing up.  More likely to happen if fasting. Better when he eats.  Happens about 1x/month.  Going on for 10 years w/o worsening.   ED.  Longstanding.  occ AM erections.  D/w pt about med options.   Hypertension:    Using medication without problems or lightheadedness: yes Chest pain with exertion:no Edema:no Short of breath:no  PMH and SH reviewed  Meds, vitals, and allergies reviewed.   ROS: See HPI.  Otherwise negative.    GEN: nad, alert and oriented HEENT: mucous membranes moist NECK: supple w/o LA CV: rrr. PULM: ctab, no inc wob ABD: soft, +bs EXT: no edema SKIN: no acute rash

## 2011-12-28 NOTE — Patient Instructions (Addendum)
Try the viagra, start with 1/2 pill at a time.   Notify the clinic with concerns.  Don't change your BP medicine.  Take care.  Glad to see you.  Try to walk more for exercise.  Physical in 1 year.

## 2012-05-21 ENCOUNTER — Other Ambulatory Visit: Payer: Self-pay | Admitting: Family Medicine

## 2012-06-16 ENCOUNTER — Other Ambulatory Visit: Payer: Self-pay | Admitting: Family Medicine

## 2013-03-05 ENCOUNTER — Ambulatory Visit (INDEPENDENT_AMBULATORY_CARE_PROVIDER_SITE_OTHER): Payer: 59 | Admitting: Internal Medicine

## 2013-03-05 ENCOUNTER — Encounter: Payer: Self-pay | Admitting: Internal Medicine

## 2013-03-05 VITALS — BP 126/80 | HR 63 | Temp 97.8°F | Wt 182.8 lb

## 2013-03-05 DIAGNOSIS — I10 Essential (primary) hypertension: Secondary | ICD-10-CM

## 2013-03-05 DIAGNOSIS — Z13220 Encounter for screening for lipoid disorders: Secondary | ICD-10-CM

## 2013-03-05 DIAGNOSIS — Z125 Encounter for screening for malignant neoplasm of prostate: Secondary | ICD-10-CM

## 2013-03-05 DIAGNOSIS — J209 Acute bronchitis, unspecified: Secondary | ICD-10-CM

## 2013-03-05 MED ORDER — AZITHROMYCIN 250 MG PO TABS: ORAL_TABLET | ORAL | Status: DC

## 2013-03-05 NOTE — Progress Notes (Signed)
HPI  Pt presents to the clinic today with c/o cough, chest congestion and sore throat. This started about 4 days ago. The cough is productive is thick chalky/burgandy mucous. He denies fevers, but has had chills and body aches. He has tried Mucinex D and nyquil. He did not get his flu shot. He has had sick contacts. He does not smoke. He denies history of allergies or breathing problems.  Review of Systems      Past Medical History  Diagnosis Date  . Ulcerative colitis 5/81    Remission for years    Family History  Problem Relation Age of Onset  . Cancer Mother     ? GYN, died during hysterectomy  . Stroke Father   . Cancer Brother 70    Bladder  . Colon cancer Neg Hx   . Prostate cancer Neg Hx     History   Social History  . Marital Status: Married    Spouse Name: N/A    Number of Children: 1  . Years of Education: N/A   Occupational History  . OWNER     Information systems manager business   Social History Main Topics  . Smoking status: Never Smoker   . Smokeless tobacco: Never Used  . Alcohol Use: No  . Drug Use: No  . Sexual Activity: No   Other Topics Concern  . 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      Allergies  Allergen Reactions  . Amoxicillin     REACTION: Headache  . Morphine Sulfate     REACTION: Headache  . Sulfonamide Derivatives     REACTION: Headache     Constitutional: Positive headache, fatigue and fever. Denies fever or abrupt weight changes.  HEENT:  Positive sore throat. Denies eye redness, eye pain, pressure behind the eyes, facial pain, nasal congestion, ear pain, ringing in the ears, wax buildup, runny nose or bloody nose. Respiratory: Positive cough. Denies difficulty breathing or shortness of breath.  Cardiovascular: Denies chest pain, chest tightness, palpitations or swelling in the hands or feet.   No other specific complaints in a  complete review of systems (except as listed in HPI above).  Objective:   BP 126/80  Pulse 63  Temp(Src) 97.8 F (36.6 C) (Oral)  Wt 182 lb 12 oz (82.895 kg)  SpO2 99% Wt Readings from Last 3 Encounters:  03/05/13 182 lb 12 oz (82.895 kg)  12/28/11 177 lb (80.287 kg)  12/27/10 181 lb 12 oz (82.441 kg)     General: Appears his stated age, well developed, well nourished in NAD. HEENT: Head: normal shape and size; Eyes: sclera white, no icterus, conjunctiva pink, PERRLA and EOMs intact; Ears: Tm's gray and intact, normal light reflex; Nose: mucosa pink and moist, septum midline; Throat/Mouth: + PND. Teeth present, mucosa erythematous and moist, no exudate noted, no lesions or ulcerations noted.  Neck: Mild cervical lymphadenopathy. Neck supple, trachea midline. No massses, lumps or thyromegaly present.  Cardiovascular: Normal rate and rhythm. S1,S2 noted.  No murmur, rubs or gallops noted. No JVD or BLE edema. No carotid bruits noted. Pulmonary/Chest: Normal effort and scattered rhonchi throughout. No respiratory distress. No wheezes, rales noted.      Assessment & Plan:   Acute Bronchtis:  Get some rest and drink plenty of water Do salt water gargles for the sore throat eRx for Azithromax x 5 days  Continue Nyquil  RTC as needed or if symptoms persist.

## 2013-03-05 NOTE — Progress Notes (Signed)
Pre-visit discussion using our clinic review tool. No additional management support is needed unless otherwise documented below in the visit note.  

## 2013-03-05 NOTE — Patient Instructions (Signed)
Acute Bronchitis Bronchitis is inflammation of the airways that extend from the windpipe into the lungs (bronchi). The inflammation often causes mucus to develop. This leads to a cough, which is the most common symptom of bronchitis.  In acute bronchitis, the condition usually develops suddenly and goes away over time, usually in a couple weeks. Smoking, allergies, and asthma can make bronchitis worse. Repeated episodes of bronchitis may cause further lung problems.  CAUSES Acute bronchitis is most often caused by the same virus that causes a cold. The virus can spread from person to person (contagious).  SIGNS AND SYMPTOMS   Cough.   Fever.   Coughing up mucus.   Body aches.   Chest congestion.   Chills.   Shortness of breath.   Sore throat.  DIAGNOSIS  Acute bronchitis is usually diagnosed through a physical exam. Tests, such as chest X-rays, are sometimes done to rule out other conditions.  TREATMENT  Acute bronchitis usually goes away in a couple weeks. Often times, no medical treatment is necessary. Medicines are sometimes given for relief of fever or cough. Antibiotics are usually not needed but may be prescribed in certain situations. In some cases, an inhaler may be recommended to help reduce shortness of breath and control the cough. A cool mist vaporizer may also be used to help thin bronchial secretions and make it easier to clear the chest.  HOME CARE INSTRUCTIONS  Get plenty of rest.   Drink enough fluids to keep your urine clear or pale yellow (unless you have a medical condition that requires fluid restriction). Increasing fluids may help thin your secretions and will prevent dehydration.   Only take over-the-counter or prescription medicines as directed by your health care provider.   Avoid smoking and secondhand smoke. Exposure to cigarette smoke or irritating chemicals will make bronchitis worse. If you are a smoker, consider using nicotine gum or skin  patches to help control withdrawal symptoms. Quitting smoking will help your lungs heal faster.   Reduce the chances of another bout of acute bronchitis by washing your hands frequently, avoiding people with cold symptoms, and trying not to touch your hands to your mouth, nose, or eyes.   Follow up with your health care provider as directed.  SEEK MEDICAL CARE IF: Your symptoms do not improve after 1 week of treatment.  SEEK IMMEDIATE MEDICAL CARE IF:  You develop an increased fever or chills.   You have chest pain.   You have severe shortness of breath.  You have bloody sputum.   You develop dehydration.  You develop fainting.  You develop repeated vomiting.  You develop a severe headache. MAKE SURE YOU:   Understand these instructions.  Will watch your condition.  Will get help right away if you are not doing well or get worse. Document Released: 03/15/2004 Document Revised: 10/08/2012 Document Reviewed: 07/29/2012 ExitCare Patient Information 2014 [employer].  

## 2013-03-06 ENCOUNTER — Telehealth: Payer: Self-pay | Admitting: Family Medicine

## 2013-03-06 NOTE — Telephone Encounter (Signed)
Relevant patient education assigned to patient using Emmi. ° °

## 2013-03-11 ENCOUNTER — Telehealth: Payer: Self-pay

## 2013-03-11 NOTE — Telephone Encounter (Signed)
Pt is aware and will keep appt for tomorrow

## 2013-03-11 NOTE — Telephone Encounter (Signed)
[name] pts wife called; pt was seen 03/05/13 and pt continues with prod cough with small amt of phlegm( wife not sue of color of phlegm), chest congestion and ? Fever ( has not taken temp). [name] request refill on antibiotic to [facility]. Advised pt from 03/05/13 note if symptoms continue  Pt to be rechecked; Scheduled appt with Dr Damita Dunnings 03/12/13 but Vickii Chafe wanted note sent to Webb Silversmith NP to see if she would refill antibiotic. [name] request cb.

## 2013-03-11 NOTE — Telephone Encounter (Signed)
She needs to be seen first. We typically don't refill abx

## 2013-03-12 ENCOUNTER — Ambulatory Visit (INDEPENDENT_AMBULATORY_CARE_PROVIDER_SITE_OTHER): Payer: 59 | Admitting: Family Medicine

## 2013-03-12 ENCOUNTER — Encounter: Payer: Self-pay | Admitting: Family Medicine

## 2013-03-12 VITALS — BP 122/74 | HR 71 | Temp 98.0°F | Wt 181.5 lb

## 2013-03-12 DIAGNOSIS — R059 Cough, unspecified: Secondary | ICD-10-CM

## 2013-03-12 DIAGNOSIS — R05 Cough: Secondary | ICD-10-CM | POA: Insufficient documentation

## 2013-03-12 DIAGNOSIS — R051 Acute cough: Secondary | ICD-10-CM | POA: Insufficient documentation

## 2013-03-12 NOTE — Assessment & Plan Note (Signed)
Likely post infectious, should resolve slowly.  D/w pt.  See instructions.

## 2013-03-12 NOTE — Patient Instructions (Signed)
Drink plenty of fluids and take delsym.  The cough should slowly improve.  If you get worse then let me know.  Take care.

## 2013-03-12 NOTE — Progress Notes (Signed)
Pre-visit discussion using our clinic review tool. No additional management support is needed unless otherwise documented below in the visit note.  Last OV note reviewed. Done with abx.  Feels better. Still with cough and "rattle but it's not as deep as it was."  No fevers.  Aches are better.  Sputum is clear or white now.  No vomiting, no diarrhea.  No rash.  Some ST, likely from the cough.  No other URI sx, ie rhinorrhea.  Taking delsym for the cough, with some help.    Meds, vitals, and allergies reviewed.   ROS: See HPI.  Otherwise, noncontributory.  GEN: nad, alert and oriented HEENT: mucous membranes moist, tm w/o erythema, nasal exam w/o erythema, scant clear discharge noted,  OP wnl NECK: supple w/o LA CV: rrr.   PULM: ctab except for faint intermittent rhonchi that clear with a cough, no inc wob EXT: no edema SKIN: no acute rash

## 2013-03-25 ENCOUNTER — Encounter: Payer: Self-pay | Admitting: Family Medicine

## 2013-03-26 ENCOUNTER — Encounter: Payer: Self-pay | Admitting: Family Medicine

## 2013-03-26 ENCOUNTER — Ambulatory Visit (INDEPENDENT_AMBULATORY_CARE_PROVIDER_SITE_OTHER): Payer: 59 | Admitting: Family Medicine

## 2013-03-26 VITALS — BP 130/80 | HR 70 | Temp 98.1°F | Ht 68.0 in | Wt 181.2 lb

## 2013-03-26 DIAGNOSIS — E781 Pure hyperglyceridemia: Secondary | ICD-10-CM

## 2013-03-26 DIAGNOSIS — R972 Elevated prostate specific antigen [PSA]: Secondary | ICD-10-CM

## 2013-03-26 DIAGNOSIS — M79609 Pain in unspecified limb: Secondary | ICD-10-CM

## 2013-03-26 DIAGNOSIS — Z0000 Encounter for general adult medical examination without abnormal findings: Secondary | ICD-10-CM

## 2013-03-26 DIAGNOSIS — D696 Thrombocytopenia, unspecified: Secondary | ICD-10-CM

## 2013-03-26 DIAGNOSIS — Z1211 Encounter for screening for malignant neoplasm of colon: Secondary | ICD-10-CM

## 2013-03-26 DIAGNOSIS — I10 Essential (primary) hypertension: Secondary | ICD-10-CM

## 2013-03-26 DIAGNOSIS — M79602 Pain in left arm: Secondary | ICD-10-CM

## 2013-03-26 NOTE — Patient Instructions (Addendum)
Check with your insurance to see if they will cover the shingles shot. Billy Kim will call about your referral. Work on your diet and try to get more exercise.   Recheck PSA in about 6 months.  If you have more left arm pain, then let me know.  Take care.  Glad to see you.

## 2013-03-26 NOTE — Progress Notes (Signed)
Pre-visit discussion using our clinic review tool. No additional management support is needed unless otherwise documented below in the visit note.  CPE- See plan.  Routine anticipatory guidance given to patient.  See health maintenance. Tetanus 2008 PNA 2011 Shingles shot d/w pt.   Flu shot encouraged.  Colonoscopy due this year.  D/w pt.  Will refer.  Diet and exercise d/w pt.  Encouraged both. "Not good on either one."  TG up, d/w pt.  PSA d/w pt.  Prev variable level. occ with LUTS, not every time.  Some caffeine.  D/w pt.  LUTS not bothersome enough to want to take meds.  Living will d/w pt.  Would have wife designated if incapacitated.    Mild dec in platelets noted.  No bleeding.  D/w pt.    Hypertension:    Using medication without problems or lightheadedness: yes Chest pain with exertion:no Edema:no Short of breath:no  L arm pain. Can happen at rest.  Can be positional.  Flexor side. No exertional sx.  No R sided sx.  Going on intermittently for unclear length of time.    PMH and SH reviewed  Meds, vitals, and allergies reviewed.   ROS: See HPI.  Otherwise negative.    GEN: nad, alert and oriented HEENT: mucous membranes moist NECK: supple w/o LA CV: rrr. PULM: ctab, no inc wob ABD: soft, +bs EXT: no edema SKIN: no acute rash Normal L shoulder, arm, elbow ROM. Not ttp, no rash. No bruising or edema.  Distally nv intact.  Grip wnl.  Prostate gland firm and smooth, no enlargement, nodularity, tenderness, mass, asymmetry or induration.

## 2013-03-27 ENCOUNTER — Telehealth: Payer: Self-pay | Admitting: Family Medicine

## 2013-03-27 DIAGNOSIS — R972 Elevated prostate specific antigen [PSA]: Secondary | ICD-10-CM | POA: Insufficient documentation

## 2013-03-27 DIAGNOSIS — D696 Thrombocytopenia, unspecified: Secondary | ICD-10-CM | POA: Insufficient documentation

## 2013-03-27 DIAGNOSIS — M79602 Pain in left arm: Secondary | ICD-10-CM | POA: Insufficient documentation

## 2013-03-27 NOTE — Assessment & Plan Note (Signed)
D/w pt.  Needs to work on diet. He agrees and he'll start. Discussed.

## 2013-03-27 NOTE — Assessment & Plan Note (Signed)
Minimally low, no bleeding, likely not significant, d/w pt.  Will hold off w/u for now.

## 2013-03-27 NOTE — Assessment & Plan Note (Signed)
Controlled, continue current meds. D/w pt about diet and exercise.

## 2013-03-27 NOTE — Telephone Encounter (Signed)
Relevant patient education assigned to patient using Emmi. ° °

## 2013-03-27 NOTE — Assessment & Plan Note (Signed)
Unclear source, not exertional, would follow for now. Normal cardiac exam and no alarming sx.  He'll notify me if continued, worse. He agrees.

## 2013-03-27 NOTE — Assessment & Plan Note (Signed)
Routine anticipatory guidance given to patient.  See health maintenance. Tetanus 2008 PNA 2011 Shingles shot d/w pt.   Flu shot encouraged.  Colonoscopy due this year.  D/w pt.  Will refer.  Diet and exercise d/w pt.  Encouraged both. "Not good on either one."  TG up, d/w pt.  PSA d/w pt.  Prev variable level. occ with LUTS, not every time.  Some caffeine.  D/w pt.  LUTS not bothersome enough to want to take meds.  Living will d/w pt.  Would have wife designated if incapacitated.

## 2013-03-27 NOTE — Assessment & Plan Note (Signed)
Still less than 4, h/o prev rise (also <4) that resolved.  Will recheck in 6 months.  Order written and given to patient. Benign DRE.

## 2013-04-08 ENCOUNTER — Encounter: Payer: Self-pay | Admitting: Internal Medicine

## 2013-05-18 ENCOUNTER — Other Ambulatory Visit: Payer: Self-pay | Admitting: Family Medicine

## 2013-05-26 ENCOUNTER — Ambulatory Visit (AMBULATORY_SURGERY_CENTER): Payer: Self-pay | Admitting: *Deleted

## 2013-05-26 VITALS — Ht 68.0 in | Wt 178.6 lb

## 2013-05-26 DIAGNOSIS — Z1211 Encounter for screening for malignant neoplasm of colon: Secondary | ICD-10-CM

## 2013-05-26 MED ORDER — NA SULFATE-K SULFATE-MG SULF 17.5-3.13-1.6 GM/177ML PO SOLN: 1.0000 | Freq: Once | ORAL | Status: DC

## 2013-05-26 NOTE — Progress Notes (Signed)
Denies allergies to eggs or soy products. Denies complications with sedation or anesthesia. 

## 2013-06-01 ENCOUNTER — Encounter: Payer: Self-pay | Admitting: Internal Medicine

## 2013-06-09 ENCOUNTER — Encounter: Payer: Self-pay | Admitting: Internal Medicine

## 2013-06-09 ENCOUNTER — Ambulatory Visit (AMBULATORY_SURGERY_CENTER): Payer: 59 | Admitting: Internal Medicine

## 2013-06-09 VITALS — BP 128/78 | HR 55 | Temp 98.6°F | Resp 14 | Ht 68.0 in | Wt 178.0 lb

## 2013-06-09 DIAGNOSIS — K5730 Diverticulosis of large intestine without perforation or abscess without bleeding: Secondary | ICD-10-CM

## 2013-06-09 DIAGNOSIS — K648 Other hemorrhoids: Secondary | ICD-10-CM

## 2013-06-09 DIAGNOSIS — Z1211 Encounter for screening for malignant neoplasm of colon: Secondary | ICD-10-CM

## 2013-06-09 MED ORDER — SODIUM CHLORIDE 0.9 % IV SOLN: 500.0000 mL | INTRAVENOUS | Status: DC

## 2013-06-09 NOTE — Patient Instructions (Addendum)
You have diverticulosis - thickened muscle rings and pouches in the colon wall. Please read the handout about this condition. I also saw hemorrhoids. If you have hemorrhoid problems (swelling, itching, bleeding) I am able to treat those with an in-office procedure. If you like, please call my office at [phone] to schedule an appointment and I can evaluate you further.  Next routine colonoscopy in 10 years - 2025 - if still going strong at 67. Will let you and your primary doctor decide then.  I appreciate the opportunity to care for you. Gatha Mayer, MD, FACG  YOU HAD AN ENDOSCOPIC PROCEDURE TODAY AT [facility]: Refer to the procedure report that was given to you for any specific questions about what was found during the examination.  If the procedure report does not answer your questions, please call your gastroenterologist to clarify.  If you requested that your care partner not be given the details of your procedure findings, then the procedure report has been included in a sealed envelope for you to review at your convenience later.  YOU SHOULD EXPECT: Some feelings of bloating in the abdomen. Passage of more gas than usual.  Walking can help get rid of the air that was put into your GI tract during the procedure and reduce the bloating. If you had a lower endoscopy (such as a colonoscopy or flexible sigmoidoscopy) you may notice spotting of blood in your stool or on the toilet paper. If you underwent a bowel prep for your procedure, then you may not have a normal bowel movement for a few days.  DIET: Your first meal following the procedure should be a light meal and then it is ok to progress to your normal diet.  A half-sandwich or bowl of soup is an example of a good first meal.  Heavy or fried foods are harder to digest and may make you feel nauseous or bloated.  Likewise meals heavy in dairy and vegetables can cause extra gas to form and this can also increase the bloating.   Drink plenty of fluids but you should avoid alcoholic beverages for 24 hours.  ACTIVITY: Your care partner should take you home directly after the procedure.  You should plan to take it easy, moving slowly for the rest of the day.  You can resume normal activity the day after the procedure however you should NOT DRIVE or use heavy machinery for 24 hours (because of the sedation medicines used during the test).    SYMPTOMS TO REPORT IMMEDIATELY: A gastroenterologist can be reached at any hour.  During normal business hours, 8:30 AM to 5:00 PM Monday through Friday, call [phone].  After hours and on weekends, please call the GI answering service at [phone] who will take a message and have the physician on call contact you.   Following lower endoscopy (colonoscopy or flexible sigmoidoscopy):  Excessive amounts of blood in the stool  Significant tenderness or worsening of abdominal pains  Swelling of the abdomen that is new, acute  Fever of 100F or higher  FOLLOW UP: If any biopsies were taken you will be contacted by phone or by letter within the next 1-3 weeks.  Call your gastroenterologist if you have not heard about the biopsies in 3 weeks.  Our staff will call the home number listed on your records the next business day following your procedure to check on you and address any questions or concerns that you may have at that time regarding  the information given to you following your procedure. This is a courtesy call and so if there is no answer at the home number and we have not heard from you through the emergency physician on call, we will assume that you have returned to your regular daily activities without incident.  SIGNATURES/CONFIDENTIALITY: You and/or your care partner have signed paperwork which will be entered into your electronic medical record.  These signatures attest to the fact that that the information above on your After Visit Summary has been reviewed and is  understood.  Full responsibility of the confidentiality of this discharge information lies with you and/or your care-partner.

## 2013-06-09 NOTE — Op Note (Signed)
[place]  [address] [place], [address]   COLONOSCOPY PROCEDURE REPORT  PATIENT: Billy Kim, Billy Kim  MR#: [id number] BIRTHDATE: August 16, 1945 , 68  yrs. old GENDER: Male ENDOSCOPIST: Gatha Mayer, MD, [facility] PROCEDURE DATE:  06/09/2013 PROCEDURE:   Colonoscopy, screening First Screening Colonoscopy - Avg.  risk and is 50 yrs.  old or older - No.  Prior Negative Screening - Now for repeat screening. 10 or more years since last screening  History of Adenoma - Now for follow-up colonoscopy & has been > or = to 3 yrs.  N/A  Polyps Removed Today? No.  Recommend repeat exam, <10 yrs? No. ASA CLASS:   Class II INDICATIONS:average risk screening and Last colonoscopy performed 10 years ago. MEDICATIONS: propofol (Diprivan) 150mg  IV, MAC sedation, administered by CRNA, and These medications were titrated to patient response per physician's verbal order  DESCRIPTION OF PROCEDURE:   After the risks benefits and alternatives of the procedure were thoroughly explained, informed consent was obtained.  A digital rectal exam revealed no abnormalities of the rectum, A digital rectal exam revealed no prostatic nodules, and A digital rectal exam revealed the prostate was not enlarged.   The LB [id number]  endoscope was introduced through the anus and advanced to the cecum, which was identified by both the appendix and ileocecal valve. No adverse events experienced.   The quality of the prep was excellent using Suprep  The instrument was then slowly withdrawn as the colon was fully examined.  COLON FINDINGS: Moderate diverticulosis was noted in the sigmoid colon.   The colon mucosa was otherwise normal.   A right colon retroflexion was performed.   Moderate sized internal hemorrhoids were found.  Retroflexed views revealed internal hemorrhoids. The time to cecum=4 minutes 02 seconds.  Withdrawal time=8 minutes 22 seconds.  The scope was withdrawn and the procedure  completed. COMPLICATIONS: There were no complications.  ENDOSCOPIC IMPRESSION: 1.   Moderate diverticulosis was noted in the sigmoid colon 2.   The colon mucosa was otherwise normal - excellent prep 3.   Moderate sized internal hemorrhoids  RECOMMENDATIONS: consider repeating colonoscopy at age 36 - per patient and PCP may schedule office follow-up for hemorrhoids if desired by calling   eSigned:  Gatha Mayer, MD, [facility] 06/09/2013 10:39 AM  cc: Elsie Stain, MD and The Patient

## 2013-06-09 NOTE — Progress Notes (Signed)
Lidocaine-40mg IV prior to Propofol InductionPropofol given over incremental dosages 

## 2013-06-10 ENCOUNTER — Telehealth: Payer: Self-pay

## 2013-06-10 NOTE — Telephone Encounter (Signed)
  Follow up Call-  Call back number 06/09/2013  Post procedure Call Back phone  # [phone]  Permission to leave phone message Yes     Patient questions:  Do you have a fever, pain , or abdominal swelling? no Pain Score  0 *  Have you tolerated food without any problems? yes  Have you been able to return to your normal activities? yes  Do you have any questions about your discharge instructions: Diet   no Medications  no Follow up visit  no  Do you have questions or concerns about your Care? no  Actions: * If pain score is 4 or above: No action needed, pain <4.  Per the pt no problems noted.  "Everyone did a great job", per the pt. Maw

## 2013-10-15 ENCOUNTER — Encounter: Payer: Self-pay | Admitting: Family Medicine

## 2014-02-10 ENCOUNTER — Ambulatory Visit (INDEPENDENT_AMBULATORY_CARE_PROVIDER_SITE_OTHER): Payer: 59 | Admitting: Family Medicine

## 2014-02-10 ENCOUNTER — Encounter: Payer: Self-pay | Admitting: Family Medicine

## 2014-02-10 VITALS — BP 110/78 | HR 67 | Temp 98.6°F | Ht 68.0 in | Wt 182.8 lb

## 2014-02-10 DIAGNOSIS — R059 Cough, unspecified: Secondary | ICD-10-CM

## 2014-02-10 DIAGNOSIS — C44209 Unspecified malignant neoplasm of skin of left ear and external auricular canal: Secondary | ICD-10-CM

## 2014-02-10 DIAGNOSIS — R05 Cough: Secondary | ICD-10-CM

## 2014-02-10 MED ORDER — AZITHROMYCIN 250 MG PO TABS: ORAL_TABLET | ORAL | Status: DC

## 2014-02-10 NOTE — Progress Notes (Signed)
Pre visit review using our clinic review tool, if applicable. No additional management support is needed unless otherwise documented below in the visit note. 

## 2014-02-10 NOTE — Progress Notes (Signed)
Dr. Frederico Hamman T. Stephanee Barcomb, MD, [facility] [place], [address] Phone: [phone] Fax: [phone]  02/10/2014  Patient: Billy Kim, MRN: [id number], DOB: December 12, 1945, 68 y.o.  Primary Physician:  Elsie Stain, MD  Chief Complaint: Cough  Subjective:   Billy Kim is a 68 y.o. very pleasant male patient who presents with the following:  Cough - 6-8 weeks of sometimes will get a productive cough. Never had lung problems. Been on ace for 10 years.  The patient has never been a smoker.  He has never had any asthma, emphysema, or other lung conditions.  His wife did have a illness that lasted for a month or 2, ultimately requiring some antibiotics, and she resolved.  Other significant history is being on lisinopril, which he has been on for approximately 10 years without any significant problems.  ?ace, treat with zpak first, wife had  ? L ear. To derm - sees Dr> Allyson Sabal. Repetitively bleeding lesion approximately 5 mm across, this is previously been frozen with cryotherapy, but has been worsening according to the patient  Past Medical History, Surgical History, Social History, Family History, Problem List, Medications, and Allergies have been reviewed and updated if relevant.   GEN: as above GI: No n/v/d, eating normally Pulm: No SOB Interactive and getting along well at home.  Otherwise, ROS is as per the HPI.  Objective:   BP 110/78 mmHg  Pulse 67  Temp(Src) 98.6 F (37 C) (Oral)  Ht 5\' 8"  (1.727 m)  Wt 182 lb 12 oz (82.895 kg)  BMI 27.79 kg/m2  SpO2 97%  GEN: WDWN, NAD, Non-toxic, A & O x 3 HEENT: Atraumatic, Normocephalic. Neck supple. No masses, No LAD. Ears and Nose: 5 mm ulcerated lesion, LEFT ear CV: RRR, No M/G/R. No JVD. No thrill. No extra heart sounds. PULM: CTA B, no wheezes, crackles, rhonchi. No retractions. No resp. distress. No accessory muscle use. EXTR: No c/c/e NEURO Normal gait.  PSYCH:  Normally interactive. Conversant. Not depressed or anxious appearing.  Calm demeanor.   Laboratory and Imaging Data:  Assessment and Plan:   Cough  Cancer of skin of left ear  Nontoxic in appearance.  Initially, I think it makes sense to treat the patient for atypical pneumonia, so I am going to give him a course of Zithromax.  If he does not improve after this, I would 1st think of an ACE inhibitor cough and discontinue this medication.  I asked him to call us if he is not improved in 3 or 4 weeks.  I am worried about the lesion on his ear, and I think it is likely a basal cell carcinoma versus squamous cell carcinoma.  He has good routine dermatological care, and he is going to follow-up with Dr. Allyson Sabal about this.  New Prescriptions   AZITHROMYCIN (ZITHROMAX) 250 MG TABLET    2 tabs po on day 1, then 1 tab po for 4 days   No orders of the defined types were placed in this encounter.    Signed,  Maud Deed. Clete Kuch, MD   Patient's Medications  New Prescriptions   AZITHROMYCIN (ZITHROMAX) 250 MG TABLET    2 tabs po on day 1, then 1 tab po for 4 days  Previous Medications   ASCORBIC ACID (VITAMIN C) 1000 MG TABLET    Take 1,000 mg by mouth daily.     ASPIRIN-ACETAMINOPHEN-CAFFEINE (EXCEDRIN EXTRA STRENGTH) 250-250-65 MG PER TABLET  Take 1 tablet by mouth as directed. As needed    CARVEDILOL (COREG) 3.125 MG TABLET    Take 1 tablet by mouth    twice a day   COENZYME Q10 (COQ10) 100 MG CAPSULE    Take 100 mg by mouth daily.     GARLIC PO    Take by mouth daily.     LISINOPRIL (PRINIVIL,ZESTRIL) 10 MG TABLET    Take 1 tablet by mouth   daily  Modified Medications   No medications on file  Discontinued Medications   No medications on file

## 2014-03-04 ENCOUNTER — Encounter: Payer: Self-pay | Admitting: *Deleted

## 2014-03-04 ENCOUNTER — Ambulatory Visit (INDEPENDENT_AMBULATORY_CARE_PROVIDER_SITE_OTHER): Payer: Self-pay | Admitting: Family Medicine

## 2014-03-04 ENCOUNTER — Encounter: Payer: Self-pay | Admitting: Family Medicine

## 2014-03-04 VITALS — BP 140/80 | HR 79 | Temp 98.7°F | Wt 179.5 lb

## 2014-03-04 DIAGNOSIS — H66002 Acute suppurative otitis media without spontaneous rupture of ear drum, left ear: Secondary | ICD-10-CM

## 2014-03-04 MED ORDER — CEFDINIR 300 MG PO CAPS: 300.0000 mg | ORAL_CAPSULE | Freq: Two times a day (BID) | ORAL | Status: DC

## 2014-03-04 NOTE — Progress Notes (Signed)
Pre visit review using our clinic review tool, if applicable. No additional management support is needed unless otherwise documented below in the visit note.  Sx started back in the fall.  Had a cough initially, was seen 02/10/14.  Given zmax per Dr. Lorelei Pont. Was improved.  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  Sx restarted at that point.  That was about 10 days ago.  In meantime, cough, sputum, L sided nose bleeding with rhinorrhea.  L max sinus and L ear pain.  No R sided sx.  Eyes watering.  Some ST.  No fevers but some chills noted.   L ear lesion noted, he'll f/u with Dr. Allyson Sabal.   Going to [place] in early 03/2014.  I told him I would check on recs and notify him.   Meds, vitals, and allergies reviewed.   ROS: See HPI.  Otherwise, noncontributory.  GEN: nad, alert and oriented HEENT: mucous membranes moist, R tm w/o erythema, L TM red and bulging, nasal exam w/o erythema, clear discharge noted,  OP with cobblestoning, L max sinus ttp NECK: supple w/o LA CV: rrr.   PULM: ctab, no inc wob EXT: no edema SKIN: no acute rash but small ulcerated lesion on L pinna noted.

## 2014-03-04 NOTE — Patient Instructions (Signed)
Start cefdinir and that should help your ear.  You should be able to tolerate that but it is partially similar to amoxil.   If not better, then notify me.  I'll check on travel recs for [place].  Glad to see you.

## 2014-03-05 DIAGNOSIS — H6690 Otitis media, unspecified, unspecified ear: Secondary | ICD-10-CM | POA: Insufficient documentation

## 2014-03-05 NOTE — Assessment & Plan Note (Signed)
Should be able to tolerate cefdinir, d/w pt.  Start that now and I'll check on travel recs in the meantime. He agrees.  Okay for outpatient f/u.

## 2014-03-07 ENCOUNTER — Telehealth: Payer: Self-pay | Admitting: Family Medicine

## 2014-03-07 NOTE — Telephone Encounter (Signed)
Please call pt.   Travelling to [place] in 2/16.  If he'll be in [place] only, then he doesn't need malaria proph meds.  If he'll be outside of those towns, then he'll need rx.  Let me know if he needs the rx and the duration of his trip.  Should get a flu shot and should start the HAV/HAB series before leaving.  Can get flu/HAV/HBV done at RN visit when done when done with antibiotics.  He would ideally get the typhoid vaccine but will likely not have time to get that done before the trip- the abx he is current on and the abx he'll likely need for malaria proph would prevent that vaccine (but not the HAV/HBV/flu) from working well.   Thanks.

## 2014-03-08 NOTE — Telephone Encounter (Signed)
Patient advised.   Patient states he will get the flu shot as soon as he finishes the ABX.  His trip may be postponed and once he finds out more about it, he will schedule the HAV/HBV if needed.  He also doesn't know yet how long he would be gone or if he would be outside of those areas listed.

## 2014-03-08 NOTE — Telephone Encounter (Signed)
Left message on patient's voicemail to return call

## 2014-03-08 NOTE — Telephone Encounter (Signed)
Patient returned your call.  Please call him back at [phone].

## 2014-03-09 NOTE — Telephone Encounter (Signed)
Noted, I'll await update from patient.

## 2014-03-29 ENCOUNTER — Encounter: Payer: Self-pay | Admitting: Family Medicine

## 2014-03-29 ENCOUNTER — Ambulatory Visit (INDEPENDENT_AMBULATORY_CARE_PROVIDER_SITE_OTHER): Payer: 59 | Admitting: Family Medicine

## 2014-03-29 VITALS — BP 126/78 | HR 67 | Temp 98.5°F | Ht 68.0 in | Wt 182.8 lb

## 2014-03-29 DIAGNOSIS — I10 Essential (primary) hypertension: Secondary | ICD-10-CM

## 2014-03-29 DIAGNOSIS — Z0000 Encounter for general adult medical examination without abnormal findings: Secondary | ICD-10-CM

## 2014-03-29 DIAGNOSIS — Z23 Encounter for immunization: Secondary | ICD-10-CM

## 2014-03-29 DIAGNOSIS — Z7189 Other specified counseling: Secondary | ICD-10-CM

## 2014-03-29 MED ORDER — LISINOPRIL 10 MG PO TABS: ORAL_TABLET | ORAL | Status: DC

## 2014-03-29 MED ORDER — CARVEDILOL 3.125 MG PO TABS: ORAL_TABLET | ORAL | Status: DC

## 2014-03-29 NOTE — Progress Notes (Signed)
CPE.  Routine anticipatory guidance given to patient.  See health maintenance.  Flu today Shingles d/ wpt.  PNA 2011 Tetanus 2008 Colonoscopy 2015 Prostate cancer screening- PSA wnl 2015 Advance directive- wife designated if patient were incapacitated.  Cognitive function addressed- see scanned forms- and if abnormal then additional documentation follows.   Hypertension:    Using medication without problems or lightheadedness: yes Chest pain with exertion:no Edema:no Short of breath:no  PMH and SH reviewed  Meds, vitals, and allergies reviewed.   ROS: See HPI.  Otherwise negative.    GEN: nad, alert and oriented HEENT: mucous membranes moist NECK: supple w/o LA CV: rrr. PULM: ctab, no inc wob ABD: soft, +bs EXT: no edema SKIN: no acute rash

## 2014-03-29 NOTE — Patient Instructions (Addendum)
Check with your insurance to see if they will cover the shingles shot. We'll contact you with your lab report. Glad to see you.  Take care.  Don't change your meds.

## 2014-03-30 LAB — COMPREHENSIVE METABOLIC PANEL
ALBUMIN: 3.7 g/dL (ref 3.6–4.8)
ALT: 17 IU/L (ref 0–44)
AST: 24 IU/L (ref 0–40)
Albumin/Globulin Ratio: 1.4 (ref 1.1–2.5)
Alkaline Phosphatase: 72 IU/L (ref 39–117)
BUN / CREAT RATIO: 16 (ref 10–22)
BUN: 16 mg/dL (ref 8–27)
Bilirubin Total: 1 mg/dL (ref 0.0–1.2)
CHLORIDE: 106 mmol/L (ref 97–108)
CO2: 22 mmol/L (ref 18–29)
Calcium: 9 mg/dL (ref 8.6–10.2)
Creatinine, Ser: 0.99 mg/dL (ref 0.76–1.27)
GFR calc Af Amer: 89 mL/min/{1.73_m2} (ref >59)
GFR calc non Af Amer: 77 mL/min/{1.73_m2} (ref >59)
Globulin, Total: 2.6 g/dL (ref 1.5–4.5)
Glucose: 116 mg/dL — ABNORMAL HIGH (ref 65–99)
Potassium: 4.7 mmol/L (ref 3.5–5.2)
Sodium: 143 mmol/L (ref 134–144)
Total Protein: 6.3 g/dL (ref 6.0–8.5)

## 2014-03-30 LAB — LIPID PANEL
CHOLESTEROL TOTAL: 130 mg/dL (ref 100–199)
Chol/HDL Ratio: 4.5 ratio units (ref 0.0–5.0)
HDL: 29 mg/dL — ABNORMAL LOW (ref >39)
LDL Calculated: 48 mg/dL (ref 0–99)
TRIGLYCERIDES: 266 mg/dL — AB (ref 0–149)
VLDL Cholesterol Cal: 53 mg/dL — ABNORMAL HIGH (ref 5–40)

## 2014-03-31 DIAGNOSIS — Z7189 Other specified counseling: Secondary | ICD-10-CM | POA: Insufficient documentation

## 2014-03-31 NOTE — Assessment & Plan Note (Signed)
Controlled, see notes on labs.  No change in meds.

## 2014-03-31 NOTE — Assessment & Plan Note (Signed)
Flu today Shingles d/ wpt.  PNA 2011 Tetanus 2008 Colonoscopy 2015 Prostate cancer screening- PSA wnl 2015 Advance directive- wife designated if patient were incapacitated.  Cognitive function addressed- see scanned forms- and if abnormal then additional documentation follows.

## 2014-04-02 ENCOUNTER — Encounter: Payer: Self-pay | Admitting: *Deleted

## 2014-06-17 ENCOUNTER — Other Ambulatory Visit: Payer: Self-pay | Admitting: Dermatology

## 2014-10-18 ENCOUNTER — Other Ambulatory Visit: Payer: Self-pay | Admitting: Family Medicine

## 2015-03-24 ENCOUNTER — Telehealth: Payer: Self-pay | Admitting: Family Medicine

## 2015-03-24 NOTE — Telephone Encounter (Signed)
Patient's wife called to ask for a lab order for patient's physical on 04/01/15.  Patient's wife works for [employer].  Patient's wife said patient will pick up order tomorrow.

## 2015-03-24 NOTE — Telephone Encounter (Signed)
Order for cmet lipid psa cbc done.  Thanks.

## 2015-03-25 NOTE — Telephone Encounter (Signed)
Order left at front desk for pick up.

## 2015-03-28 ENCOUNTER — Other Ambulatory Visit: Payer: Self-pay | Admitting: Family Medicine

## 2015-03-29 LAB — COMPREHENSIVE METABOLIC PANEL
ALBUMIN: 3.9 g/dL (ref 3.5–4.8)
ALT: 20 IU/L (ref 0–44)
AST: 22 IU/L (ref 0–40)
Albumin/Globulin Ratio: 1.7 (ref 1.1–2.5)
Alkaline Phosphatase: 74 IU/L (ref 39–117)
BUN / CREAT RATIO: 18 (ref 10–22)
BUN: 17 mg/dL (ref 8–27)
Bilirubin Total: 1.6 mg/dL — ABNORMAL HIGH (ref 0.0–1.2)
CALCIUM: 8.3 mg/dL — AB (ref 8.6–10.2)
CO2: 22 mmol/L (ref 18–29)
CREATININE: 0.93 mg/dL (ref 0.76–1.27)
Chloride: 108 mmol/L — ABNORMAL HIGH (ref 96–106)
GFR calc Af Amer: 96 mL/min/{1.73_m2} (ref >59)
GFR, EST NON AFRICAN AMERICAN: 83 mL/min/{1.73_m2} (ref >59)
GLOBULIN, TOTAL: 2.3 g/dL (ref 1.5–4.5)
GLUCOSE: 104 mg/dL — AB (ref 65–99)
Potassium: 4.4 mmol/L (ref 3.5–5.2)
Sodium: 145 mmol/L — ABNORMAL HIGH (ref 134–144)
Total Protein: 6.2 g/dL (ref 6.0–8.5)

## 2015-03-29 LAB — CBC WITH DIFFERENTIAL/PLATELET
BASOS ABS: 0 10*3/uL (ref 0.0–0.2)
Basos: 0 %
EOS (ABSOLUTE): 0.2 10*3/uL (ref 0.0–0.4)
EOS: 4 %
HEMATOCRIT: 44.1 % (ref 37.5–51.0)
HEMOGLOBIN: 15.4 g/dL (ref 12.6–17.7)
IMMATURE GRANS (ABS): 0 10*3/uL (ref 0.0–0.1)
IMMATURE GRANULOCYTES: 0 %
LYMPHS: 22 %
Lymphocytes Absolute: 1.1 10*3/uL (ref 0.7–3.1)
MCH: 30.9 pg (ref 26.6–33.0)
MCHC: 34.9 g/dL (ref 31.5–35.7)
MCV: 89 fL (ref 79–97)
MONOCYTES: 8 %
Monocytes Absolute: 0.4 10*3/uL (ref 0.1–0.9)
NEUTROS PCT: 66 %
Neutrophils Absolute: 3.3 10*3/uL (ref 1.4–7.0)
Platelets: 114 10*3/uL — ABNORMAL LOW (ref 150–379)
RBC: 4.98 x10E6/uL (ref 4.14–5.80)
RDW: 13.9 % (ref 12.3–15.4)
WBC: 5.1 10*3/uL (ref 3.4–10.8)

## 2015-03-29 LAB — LIPID PANEL W/O CHOL/HDL RATIO
Cholesterol, Total: 139 mg/dL (ref 100–199)
HDL: 27 mg/dL — AB (ref >39)
LDL Calculated: 51 mg/dL (ref 0–99)
TRIGLYCERIDES: 304 mg/dL — AB (ref 0–149)
VLDL CHOLESTEROL CAL: 61 mg/dL — AB (ref 5–40)

## 2015-03-29 LAB — PSA: PROSTATE SPECIFIC AG, SERUM: 1.5 ng/mL (ref 0.0–4.0)

## 2015-04-01 ENCOUNTER — Ambulatory Visit (INDEPENDENT_AMBULATORY_CARE_PROVIDER_SITE_OTHER): Payer: 59 | Admitting: Family Medicine

## 2015-04-01 ENCOUNTER — Encounter: Payer: Self-pay | Admitting: Family Medicine

## 2015-04-01 VITALS — BP 138/74 | HR 57 | Temp 97.8°F | Ht 67.0 in | Wt 180.8 lb

## 2015-04-01 DIAGNOSIS — E781 Pure hyperglyceridemia: Secondary | ICD-10-CM

## 2015-04-01 DIAGNOSIS — I10 Essential (primary) hypertension: Secondary | ICD-10-CM

## 2015-04-01 DIAGNOSIS — Z0000 Encounter for general adult medical examination without abnormal findings: Secondary | ICD-10-CM | POA: Diagnosis not present

## 2015-04-01 DIAGNOSIS — Z23 Encounter for immunization: Secondary | ICD-10-CM | POA: Diagnosis not present

## 2015-04-01 DIAGNOSIS — Z119 Encounter for screening for infectious and parasitic diseases, unspecified: Secondary | ICD-10-CM

## 2015-04-01 DIAGNOSIS — D696 Thrombocytopenia, unspecified: Secondary | ICD-10-CM

## 2015-04-01 MED ORDER — CVS FISH OIL 1000 MG PO CAPS: 1.0000 | ORAL_CAPSULE | Freq: Every day | ORAL | Status: DC

## 2015-04-01 MED ORDER — CARVEDILOL 3.125 MG PO TABS: ORAL_TABLET | ORAL | Status: DC

## 2015-04-01 NOTE — Progress Notes (Signed)
Pre visit review using our clinic review tool, if applicable. No additional management support is needed unless otherwise documented below in the visit note.  CPE- See plan.  Routine anticipatory guidance given to patient.  See health maintenance. Flu today 2017 Shingles d/w pt.  PNA 2011 Tetanus 2008 Colonoscopy 2015 Prostate cancer screening- PSA wnl 2017 Advance directive- wife designated if patient were incapacitated.  Pt opts in for HCV screening.  D/w pt re: routine screening.    He noted some memory changes.  Wife has noted some changes.  Unclear if from his work demands, which are high.  He can still do his job but he'll occ forget a needed tool.  He'll have to use reminders some at work.  He can still do all of the work, and it is noted that the work is complicated.  See testing below.   He has noted some fatigue.  He can take exceedrin with caffeine and he'll feel better.  This has been going on for months, but not needing the med daily.  He's been working more hours recently- his nephew has cancer and has been out of work.  I offered my condolences.  Fortunately his nephew is doing better in the meantime.  No FCNAVD.    HLD.  TG elevation d/w pt.  Old finding.     Low PLT, d/w pt.  Stable.  No bleeding.    Hypertension:    Using medication without problems or lightheadedness: yes Chest pain with exertion:no Edema:no Short of breath:no  He had some indigestion and upper abd sx that came on with eating peanuts and resolved off peanuts.  No sx in the meantime, back to baseline for several months now.    PMH and SH reviewed  Meds, vitals, and allergies reviewed.   ROS: See HPI.  Otherwise negative.    GEN: nad, alert and oriented HEENT: mucous membranes moist NECK: supple w/o LA CV: rrr. PULM: ctab, no inc wob ABD: soft, +bs EXT: no edema SKIN: no acute rash  Year month and day normal 3/3 attention 3/3 recall EARTH--->HTRAE Normal clock reading and math

## 2015-04-01 NOTE — Patient Instructions (Addendum)
Check with your insurance to see if they will cover the shingles shot. I would get another pneumonia shot later one.  Prevnar 13.   You can call for a nurse visit.   Your memory testing was normal today.  If you have more trouble, then let me know.  We can recheck you later in the spring (you may need some extra labs or imaging at that point).  Add on fish oil (OTC) to help with your triglycerides.  Take care.  Glad to see you.

## 2015-04-03 NOTE — Assessment & Plan Note (Signed)
Stable, continue as is.  He agrees.   

## 2015-04-03 NOTE — Assessment & Plan Note (Signed)
He'll add on fish oil.  No other change in meds.  He'll work on diet and exercise.

## 2015-04-03 NOTE — Assessment & Plan Note (Signed)
Stable

## 2015-04-03 NOTE — Assessment & Plan Note (Addendum)
Flu today 2017 Shingles d/w pt.  PNA 2011 Tetanus 2008 Colonoscopy 2015 Prostate cancer screening- PSA wnl 2017 Advance directive- wife designated if patient were incapacitated.  Pt opts in for HCV screening.  D/w pt re: routine screening.   He'll monitor memory changes and fatigue and update me as needed.  Normal testing today.  See AVS.

## 2015-05-19 ENCOUNTER — Ambulatory Visit: Payer: 59 | Admitting: Family Medicine

## 2015-05-20 ENCOUNTER — Ambulatory Visit
Admission: RE | Admit: 2015-05-20 | Discharge: 2015-05-20 | Disposition: A | Discharge status: Home / Self Care | Payer: 59 | Source: Ambulatory Visit | Attending: Internal Medicine | Admitting: Internal Medicine

## 2015-05-20 ENCOUNTER — Ambulatory Visit: Payer: 59 | Admitting: Family Medicine

## 2015-05-20 ENCOUNTER — Encounter: Payer: Self-pay | Admitting: Internal Medicine

## 2015-05-20 ENCOUNTER — Ambulatory Visit (INDEPENDENT_AMBULATORY_CARE_PROVIDER_SITE_OTHER): Payer: 59 | Admitting: Internal Medicine

## 2015-05-20 VITALS — BP 132/76 | HR 62 | Temp 98.7°F | Wt 183.0 lb

## 2015-05-20 DIAGNOSIS — J189 Pneumonia, unspecified organism: Secondary | ICD-10-CM

## 2015-05-20 DIAGNOSIS — R05 Cough: Secondary | ICD-10-CM | POA: Diagnosis not present

## 2015-05-20 DIAGNOSIS — J984 Other disorders of lung: Secondary | ICD-10-CM | POA: Insufficient documentation

## 2015-05-20 MED ORDER — HYDROCODONE-HOMATROPINE 5-1.5 MG/5ML PO SYRP: 5.0000 mL | ORAL_SOLUTION | Freq: Three times a day (TID) | ORAL | Status: DC | PRN

## 2015-05-20 NOTE — Progress Notes (Signed)
Subjective:    Patient ID: Minard Borjas Offenberger, male    DOB: 04/18/1945, 70 y.o.   MRN: [id number]  HPI  70YO male presents for acute visit.  Cough - Started last Friday when he had burning broken light bulb in his home and inhaled some of the fumes. He developed sore throat and chest tightness at that time. He did not seek care. The bulb was an energy efficient bulb, unsure brand. Since that time, he has had mostly dry cough. Small amount of nasal drainage.  Occasional dyspnea. No fever. Few chills.   Wt Readings from Last 3 Encounters:  05/20/15 183 lb (83.008 kg)  04/01/15 180 lb 12 oz (81.988 kg)  03/29/14 182 lb 12 oz (82.895 kg)   BP Readings from Last 3 Encounters:  05/20/15 132/76  04/01/15 138/74  03/29/14 126/78    Past Medical History  Diagnosis Date  . Ulcerative colitis 5/81    Remission for years  . Hypertension    Family History  Problem Relation Age of Onset  . Cancer Mother     ? GYN, died during hysterectomy  . Stroke Father   . Cancer Brother 70    Bladder  . Colon cancer Neg Hx   . Prostate cancer Neg Hx   . Esophageal cancer Neg Hx   . Rectal cancer Neg Hx   . Stomach cancer Neg Hx    Past Surgical History  Procedure Laterality Date  . Laparoscopic appendectomy  03/1981  . Inguinal hernia repair  04/09/06    Bilateral  . Cardiac catheterization  03/20/01    Cardiolite EF 55% 02/10/02  . Colonoscopy  multiple   Social History   Social History  . Marital Status: Married    Spouse Name: N/A  . Number of Children: 1  . Years of Education: N/A   Occupational History  . OWNER     Information systems manager business   Social History Main Topics  . Smoking status: Never Smoker   . Smokeless tobacco: Never Used  . Alcohol Use: No  . Drug Use: No  . Sexual Activity: No   Other Topics Concern  . 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      Review of  Systems  Constitutional: Positive for chills. Negative for fever, activity change and fatigue.  HENT: Negative for congestion, ear discharge, ear pain, hearing loss, nosebleeds, postnasal drip, rhinorrhea, sinus pressure, sneezing, sore throat, tinnitus, trouble swallowing and voice change.   Eyes: Negative for discharge, redness, itching and visual disturbance.  Respiratory: Positive for cough, chest tightness and shortness of breath. Negative for wheezing and stridor.   Cardiovascular: Negative for chest pain and leg swelling.  Musculoskeletal: Negative for myalgias, arthralgias, neck pain and neck stiffness.  Skin: Negative for color change and rash.  Neurological: Negative for dizziness, facial asymmetry and headaches.  Psychiatric/Behavioral: Negative for sleep disturbance.       Objective:    BP 132/76 mmHg  Pulse 62  Temp(Src) 98.7 F (37.1 C) (Oral)  Wt 183 lb (83.008 kg)  SpO2 98% Physical Exam  Constitutional: He is oriented to person, place, and time. He appears well-developed and well-nourished. No distress.  HENT:  Head: Normocephalic and atraumatic.  Right Ear: External ear normal.  Left Ear: External ear normal.  Nose: Nose normal.  Mouth/Throat: Oropharynx is clear and moist. No oropharyngeal exudate.  Eyes:  Conjunctivae and EOM are normal. Pupils are equal, round, and reactive to light. Right eye exhibits no discharge. Left eye exhibits no discharge. No scleral icterus.  Neck: Normal range of motion. Neck supple. No tracheal deviation present. No thyromegaly present.  Cardiovascular: Normal rate, regular rhythm and normal heart sounds.  Exam reveals no gallop and no friction rub.   No murmur heard. Pulmonary/Chest: Effort normal and breath sounds normal. No accessory muscle usage. No tachypnea. No respiratory distress. He has no decreased breath sounds. He has no wheezes. He has no rhonchi. He has no rales. He exhibits no tenderness.  Musculoskeletal: Normal range  of motion. He exhibits no edema.  Lymphadenopathy:    He has no cervical adenopathy.  Neurological: He is alert and oriented to person, place, and time. No cranial nerve deficit. Coordination normal.  Skin: Skin is warm and dry. No rash noted. He is not diaphoretic. No erythema. No pallor.  Psychiatric: He has a normal mood and affect. His behavior is normal. Judgment and thought content normal.          Assessment & Plan:   Problem List Items Addressed This Visit      Unprioritized   Pneumonitis - Primary    Symptoms and exam consistent with pneumonitis from inhalation exposure. Called Poison Control. They agreed with plan for CXR. CXR showed no acute injury. Per Poison Control, no need for lab monitoring given amount of potential mercury exposure. Follow up recheck on Monday.      Relevant Orders   DG Chest 2 View (Completed)       Return in about 3 days (around 05/23/2015) for Recheck.  Ronette Deter, MD Internal Medicine [facility]

## 2015-05-20 NOTE — Assessment & Plan Note (Signed)
Symptoms and exam consistent with pneumonitis from inhalation exposure. Called Poison Control. They agreed with plan for CXR. CXR showed no acute injury. Per Poison Control, no need for lab monitoring given amount of potential mercury exposure. Follow up recheck on Monday.

## 2015-05-20 NOTE — Progress Notes (Signed)
Pre visit review using our clinic review tool, if applicable. No additional management support is needed unless otherwise documented below in the visit note. 

## 2015-05-20 NOTE — Patient Instructions (Signed)
Please go to [facility] for a chest xray.  Follow up for recheck on Monday.

## 2015-05-23 ENCOUNTER — Encounter: Payer: Self-pay | Admitting: Internal Medicine

## 2015-05-23 ENCOUNTER — Ambulatory Visit (INDEPENDENT_AMBULATORY_CARE_PROVIDER_SITE_OTHER): Payer: 59 | Admitting: Internal Medicine

## 2015-05-23 VITALS — BP 135/77 | HR 60 | Temp 98.2°F | Ht 67.0 in | Wt 186.5 lb

## 2015-05-23 DIAGNOSIS — J189 Pneumonia, unspecified organism: Secondary | ICD-10-CM | POA: Diagnosis not present

## 2015-05-23 MED ORDER — AZITHROMYCIN 250 MG PO TABS: ORAL_TABLET | ORAL | Status: DC

## 2015-05-23 NOTE — Progress Notes (Signed)
Pre visit review using our clinic review tool, if applicable. No additional management support is needed unless otherwise documented below in the visit note. 

## 2015-05-23 NOTE — Progress Notes (Signed)
Subjective:    Patient ID: Billy Kim, male    DOB: Jul 01, 1945, 70 y.o.   MRN: [id number]  HPI  70YO male presents for follow up.  Seen Friday for cough after exposure to broken burning lightbulb.  Cough improved some, however continues to have some productive cough with green sputum. Some persistent chest tightness and irritation. No dyspnea.    Wt Readings from Last 3 Encounters:  05/23/15 186 lb 8 oz (84.596 kg)  05/20/15 183 lb (83.008 kg)  04/01/15 180 lb 12 oz (81.988 kg)   BP Readings from Last 3 Encounters:  05/23/15 135/77  05/20/15 132/76  04/01/15 138/74    Past Medical History  Diagnosis Date  . Ulcerative colitis 5/81    Remission for years  . Hypertension    Family History  Problem Relation Age of Onset  . Cancer Mother     ? GYN, died during hysterectomy  . Stroke Father   . Cancer Brother 70    Bladder  . Colon cancer Neg Hx   . Prostate cancer Neg Hx   . Esophageal cancer Neg Hx   . Rectal cancer Neg Hx   . Stomach cancer Neg Hx    Past Surgical History  Procedure Laterality Date  . Laparoscopic appendectomy  03/1981  . Inguinal hernia repair  04/09/06    Bilateral  . Cardiac catheterization  03/20/01    Cardiolite EF 55% 02/10/02  . Colonoscopy  multiple   Social History   Social History  . Marital Status: Married    Spouse Name: N/A  . Number of Children: 1  . Years of Education: N/A   Occupational History  . OWNER     Information systems manager business   Social History Main Topics  . Smoking status: Never Smoker   . Smokeless tobacco: Never Used  . Alcohol Use: No  . Drug Use: No  . Sexual Activity: No   Other Topics Concern  . 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      Review of Systems  Constitutional: Negative for fever, chills, activity change and fatigue.  HENT: Negative for congestion, ear discharge, ear pain, hearing loss,  nosebleeds, postnasal drip, rhinorrhea, sinus pressure, sneezing, sore throat, tinnitus, trouble swallowing and voice change.   Eyes: Negative for discharge, redness, itching and visual disturbance.  Respiratory: Positive for cough and chest tightness. Negative for shortness of breath, wheezing and stridor.   Cardiovascular: Negative for chest pain and leg swelling.  Musculoskeletal: Negative for myalgias, arthralgias, neck pain and neck stiffness.  Skin: Negative for color change and rash.  Neurological: Negative for dizziness, facial asymmetry and headaches.  Psychiatric/Behavioral: Negative for sleep disturbance.       Objective:    BP 135/77 mmHg  Pulse 60  Temp(Src) 98.2 F (36.8 C) (Oral)  Ht 5\' 7"  (1.702 m)  Wt 186 lb 8 oz (84.596 kg)  BMI 29.20 kg/m2  SpO2 98% Physical Exam  Constitutional: He is oriented to person, place, and time. He appears well-developed and well-nourished. No distress.  HENT:  Head: Normocephalic and atraumatic.  Right Ear: External ear normal.  Left Ear: External ear normal.  Nose: Nose normal.  Mouth/Throat: Oropharynx is clear and moist. No oropharyngeal exudate.  Eyes: Conjunctivae and EOM are normal. Pupils are equal, round, and reactive to light. Right eye exhibits no discharge. Left eye exhibits no discharge.  No scleral icterus.  Neck: Normal range of motion. Neck supple. No tracheal deviation present. No thyromegaly present.  Cardiovascular: Normal rate, regular rhythm and normal heart sounds.  Exam reveals no gallop and no friction rub.   No murmur heard. Pulmonary/Chest: Effort normal and breath sounds normal. No accessory muscle usage. No tachypnea. No respiratory distress. He has no decreased breath sounds. He has no wheezes. He has no rhonchi. He has no rales. He exhibits no tenderness.  Musculoskeletal: Normal range of motion. He exhibits no edema.  Lymphadenopathy:    He has no cervical adenopathy.  Neurological: He is alert and  oriented to person, place, and time. No cranial nerve deficit. Coordination normal.  Skin: Skin is warm and dry. No rash noted. He is not diaphoretic. No erythema. No pallor.  Psychiatric: He has a normal mood and affect. His behavior is normal. Judgment and thought content normal.          Assessment & Plan:   Problem List Items Addressed This Visit      Unprioritized   Pneumonitis - Primary    Recent inhalation injury after mercury exposure. Exam normal today, however having some productive cough. Discussed that this may be secondary to inhalation injury or other cause, such as viral infection with secondary bronchitis. Start Azithromycin. Continue prn Hydrocodone. Follow up if symptoms do not continue to improve this week.          Return if symptoms worsen or fail to improve.  Ronette Deter, MD Internal Medicine [facility]

## 2015-05-23 NOTE — Assessment & Plan Note (Signed)
Recent inhalation injury after mercury exposure. Exam normal today, however having some productive cough. Discussed that this may be secondary to inhalation injury or other cause, such as viral infection with secondary bronchitis. Start Azithromycin. Continue prn Hydrocodone. Follow up if symptoms do not continue to improve this week.

## 2015-05-23 NOTE — Patient Instructions (Signed)
Start Azithromycin.  Follow up if symptoms not improving this week.

## 2015-07-14 ENCOUNTER — Encounter: Payer: Self-pay | Admitting: Family Medicine

## 2015-07-14 ENCOUNTER — Ambulatory Visit (INDEPENDENT_AMBULATORY_CARE_PROVIDER_SITE_OTHER): Payer: 59 | Admitting: Family Medicine

## 2015-07-14 VITALS — BP 124/80 | HR 72 | Temp 98.5°F | Wt 180.5 lb

## 2015-07-14 DIAGNOSIS — J0100 Acute maxillary sinusitis, unspecified: Secondary | ICD-10-CM | POA: Diagnosis not present

## 2015-07-14 MED ORDER — DOXYCYCLINE HYCLATE 100 MG PO TABS: 100.0000 mg | ORAL_TABLET | Freq: Two times a day (BID) | ORAL | Status: DC

## 2015-07-14 NOTE — Assessment & Plan Note (Signed)
Nontoxic, doxy, supportive care o/w.  F/u prn.  He agrees.  Ctab. Okay for outpatient f/u.

## 2015-07-14 NOTE — Progress Notes (Signed)
Pre visit review using our clinic review tool, if applicable. No additional management support is needed unless otherwise documented below in the visit note.  Initially with sinus congestion and upper tooth pain.  Now with chest congestion.  Some cough.  Not much sputum production usually, some occ rare sputum.   Sx started about 3 weeks ago.  Not better in the meantime with otc meds, cough medicines, etc.  Fatigue.  No fevers.  B upper tooth pain last night.  Some B ear pain, no ST.  No neck pain.  No rash.  No wheeze. No vomiting, no diarrhea.    Meds, vitals, and allergies reviewed.   ROS: Per HPI unless specifically indicated in ROS section   GEN: nad, alert and oriented HEENT: mucous membranes moist, tm w/o erythema, nasal exam w/o erythema, clear discharge noted,  OP with cobblestoning, max sinuses ttp B NECK: supple w/o LA CV: rrr.   PULM: ctab, no inc wob EXT: no edema

## 2015-07-14 NOTE — Patient Instructions (Signed)
Start doxycycline today.  Caution for sunburns.  Drink plenty of fluids, take tylenol as needed, and this should gradually improve.  Take care.  Let us know if you have other concerns.

## 2015-09-27 ENCOUNTER — Ambulatory Visit (INDEPENDENT_AMBULATORY_CARE_PROVIDER_SITE_OTHER): Payer: 59 | Admitting: Family Medicine

## 2015-09-27 ENCOUNTER — Encounter: Payer: Self-pay | Admitting: Family Medicine

## 2015-09-27 DIAGNOSIS — M546 Pain in thoracic spine: Secondary | ICD-10-CM

## 2015-09-27 MED ORDER — IBUPROFEN 200 MG PO TABS: 400.0000 mg | ORAL_TABLET | Freq: Three times a day (TID) | ORAL | Status: DC | PRN

## 2015-09-27 MED ORDER — CYCLOBENZAPRINE HCL 10 MG PO TABS: 5.0000 mg | ORAL_TABLET | Freq: Three times a day (TID) | ORAL | 0 refills | Status: DC | PRN

## 2015-09-27 NOTE — Patient Instructions (Signed)
Try flexeril.  Stop excedrin and use ibuprofen with food.  Continue using heat.  If not better, then let me know.  Take care.  Glad to see you.

## 2015-09-27 NOTE — Progress Notes (Signed)
Pre visit review using our clinic review tool, if applicable. No additional management support is needed unless otherwise documented below in the visit note. 

## 2015-09-27 NOTE — Progress Notes (Signed)
Back pain.  Started about 6 months ago.  Would "get by" some better with excedrin.  Pain starts high in the R side of the back.  No midline or L sided pain.  Pain radiates through to the front of the chest, constant, to the R lower anterior ribs near the lower sternum.  Some better with some positions, worse with movement.  No rash.  No pain with deep breath.  Anterior lower chest wall on R side can be sore to the touch.  No specific injury.  Has been busy at work, some lifting, some twisting at work.  Twisting clearly makes the pain worse.  No FCNAVD.  No leg weakness. No numbness.  Prev x rayed at chiropractor clinic, but I don't have those films today.   R hip pain better with chiropractor tx.    Meds, vitals, and allergies reviewed.   ROS: Per HPI unless specifically indicated in ROS section   GEN: nad, alert and oriented HEENT: mucous membranes moist NECK: supple w/o LA CV: rrr.  Chest wall slightly ttp at lower anterior medial ribs, just to R of sternum.  No bruising.  Pain in mid thoracic back with twisting motion, clearly reproducible  PULM: ctab, no inc wob ABD: soft, +bs EXT: no edema SKIN: no acute rash

## 2015-09-28 DIAGNOSIS — M549 Dorsalgia, unspecified: Secondary | ICD-10-CM | POA: Insufficient documentation

## 2015-09-28 NOTE — Assessment & Plan Note (Signed)
D/w pt.  Likely MSK strain.  Try flexeril.  Stop excedrin and use ibuprofen with food.  Continue using heat.  If not better, then he'll let me know.  This doesn't appear to be an intrathoracic or intraabdominal or ominous process.  He doesn't have exertional sx.  Okay for outpatient f/u.

## 2015-11-23 ENCOUNTER — Other Ambulatory Visit: Payer: Self-pay | Admitting: Family Medicine

## 2015-11-23 NOTE — Telephone Encounter (Signed)
Received refill electronically Last office visit 09/27/15/acute Medication is no longer on med list. Okay to refill?

## 2015-11-24 NOTE — Telephone Encounter (Signed)
Check with pharmacy or patient.  Per EMR had been off for a while now.  Thanks.

## 2016-02-13 ENCOUNTER — Other Ambulatory Visit: Payer: Self-pay | Admitting: Family Medicine

## 2016-04-09 ENCOUNTER — Telehealth: Payer: Self-pay | Admitting: *Deleted

## 2016-04-09 NOTE — Telephone Encounter (Signed)
PT needs orders for labs to be drawn at [facility]. Please call him when ready [phone].

## 2016-04-10 ENCOUNTER — Other Ambulatory Visit: Payer: Self-pay | Admitting: Family Medicine

## 2016-04-10 NOTE — Telephone Encounter (Signed)
Order done for PSA, hepatitis C screening, lipid panel, CMET.  Written.  Thanks.

## 2016-04-10 NOTE — Telephone Encounter (Signed)
Patient notified by telephone that order is up front ready for pickup.

## 2016-04-11 LAB — HEPATITIS C ANTIBODY: Hep C Virus Ab: <0.1 s/co ratio (ref 0.0–0.9)

## 2016-04-11 LAB — COMPREHENSIVE METABOLIC PANEL
ALK PHOS: 82 IU/L (ref 39–117)
ALT: 23 IU/L (ref 0–44)
AST: 28 IU/L (ref 0–40)
Albumin/Globulin Ratio: 1.8 (ref 1.2–2.2)
Albumin: 4.3 g/dL (ref 3.5–4.8)
BUN/Creatinine Ratio: 15 (ref 10–24)
BUN: 17 mg/dL (ref 8–27)
Bilirubin Total: 2.8 mg/dL — ABNORMAL HIGH (ref 0.0–1.2)
CALCIUM: 9.3 mg/dL (ref 8.6–10.2)
CO2: 22 mmol/L (ref 18–29)
CREATININE: 1.12 mg/dL (ref 0.76–1.27)
Chloride: 103 mmol/L (ref 96–106)
GFR calc Af Amer: 76 (ref >59)
GFR calc non Af Amer: 66 (ref >59)
GLUCOSE: 88 mg/dL (ref 65–99)
Globulin, Total: 2.4 (ref 1.5–4.5)
Potassium: 4.5 mmol/L (ref 3.5–5.2)
SODIUM: 144 mmol/L (ref 134–144)
Total Protein: 6.7 g/dL (ref 6.0–8.5)

## 2016-04-11 LAB — LIPID PANEL W/O CHOL/HDL RATIO
Cholesterol, Total: 137 mg/dL (ref 100–199)
HDL: 30 mg/dL — AB (ref >39)
LDL CALC: 48 (ref 0–99)
TRIGLYCERIDES: 296 mg/dL — AB (ref 0–149)
VLDL CHOLESTEROL CAL: 59 — AB (ref 5–40)

## 2016-04-11 LAB — PSA: Prostate Specific Ag, Serum: 2.1 ng/mL (ref 0.0–4.0)

## 2016-04-12 ENCOUNTER — Encounter: Payer: 59 | Admitting: Family Medicine

## 2016-04-23 ENCOUNTER — Encounter (INDEPENDENT_AMBULATORY_CARE_PROVIDER_SITE_OTHER): Payer: Self-pay

## 2016-04-23 ENCOUNTER — Ambulatory Visit (INDEPENDENT_AMBULATORY_CARE_PROVIDER_SITE_OTHER): Payer: 59 | Admitting: Family Medicine

## 2016-04-23 ENCOUNTER — Encounter: Payer: Self-pay | Admitting: Family Medicine

## 2016-04-23 VITALS — BP 122/80 | HR 65 | Temp 98.7°F | Ht 67.5 in | Wt 182.0 lb

## 2016-04-23 DIAGNOSIS — I10 Essential (primary) hypertension: Secondary | ICD-10-CM

## 2016-04-23 DIAGNOSIS — Z7189 Other specified counseling: Secondary | ICD-10-CM

## 2016-04-23 DIAGNOSIS — M25512 Pain in left shoulder: Secondary | ICD-10-CM | POA: Insufficient documentation

## 2016-04-23 DIAGNOSIS — E781 Pure hyperglyceridemia: Secondary | ICD-10-CM

## 2016-04-23 DIAGNOSIS — Z0000 Encounter for general adult medical examination without abnormal findings: Secondary | ICD-10-CM

## 2016-04-23 MED ORDER — CARVEDILOL 3.125 MG PO TABS: 3.1250 mg | ORAL_TABLET | Freq: Two times a day (BID) | ORAL | 3 refills | Status: DC

## 2016-04-23 NOTE — Assessment & Plan Note (Signed)
Likely cuff irritation, d/w pt about home exercises and handout given, may improve with home exercises.

## 2016-04-23 NOTE — Assessment & Plan Note (Signed)
Flu encouraged Shingles d/w pt.  PNA encouraged  Tetanus 2008 Colonoscopy 2015 Prostate cancer screening- PSA wnl 2018, d/w pt.  Advance directive- wife designated if patient were incapacitated.  HCV screening neg, d/w pt.   Memory status d/w pt.  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  He has trouble with recalling names of people.  He can still function with work.  "I know what to do and how to do it."  He wants to transition some, start slowing down with work.  He is considering options with work.  His nephew is still out of work, dealing with cancer dx.  This is the nephew that prev helped patient at work.  Knows the year, month, date.  3/3 attention.  Can do math.  3/3 recall.  Normal testing today.   See AVS.

## 2016-04-23 NOTE — Assessment & Plan Note (Signed)
Advance directive- wife designated if patient were incapacitated.  

## 2016-04-23 NOTE — Progress Notes (Signed)
CPE- See plan.  Routine anticipatory guidance given to patient.  See health maintenance.  Flu encouraged Shingles d/w pt.  PNA encouraged  Tetanus 2008 Colonoscopy 2015 Prostate cancer screening- PSA wnl 2018, d/w pt.  Advance directive- wife designated if patient were incapacitated.  HCV screening neg, d/w pt.   Memory status d/w pt.  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  He has trouble with recalling names of people.  He can still function with work.  "I know what to do and how to do it."  He wants to transition some, start slowing down with work.  He is considering options with work.  His nephew is still out of work, dealing with cancer dx.  This is the nephew that prev helped patient at work.  Knows the year, month, date.  3/3 attention.  Can do math.  3/3 recall.  Normal testing today.     HLD.  TG elevation d/w pt.  Old finding, similar to prev.  D/w pt about diet and exercise, d/w pt about labs in general.    Hypertension:               Using medication without problems or lightheadedness: yes Chest pain with exertion:no Edema:no Short of breath:no  L shoulder pain at night and with ext rotation.  May be disrupting sleep, which may affect his memory.  D/w pt.  No injury.    PMH and SH reviewed  Meds, vitals, and allergies reviewed.   ROS: Per HPI.  Unless specifically indicated otherwise in HPI, the patient denies:  General: fever. Eyes: acute vision changes ENT: sore throat Cardiovascular: chest pain Respiratory: SOB GI: vomiting GU: dysuria Musculoskeletal: acute back pain Derm: acute rash Neuro: acute motor dysfunction Psych: worsening mood Endocrine: polydipsia Heme: bleeding Allergy: hayfever  GEN: nad, alert and oriented HEENT: mucous membranes moist NECK: supple w/o LA CV: rrr. PULM: ctab, no inc wob ABD: soft, +bs EXT: no edema SKIN: no acute rash, SKs noted.  L shoulder with normal ROM, no arm drop, pain with ext rotation but not int  rotation, + scap assist.  AC not ttp.  Normal abduction.

## 2016-04-23 NOTE — Assessment & Plan Note (Signed)
D/w pt about labs and diet and exercise.

## 2016-04-23 NOTE — Patient Instructions (Addendum)
Check with your insurance to see if they will cover the shingles shot. Check to see if the tetanus shot is cheaper at the pharmacy.   If you or you wife think your memory is getting worse, then please have both of you come in for a 30 minute visit.   Take care.  Glad to see you.

## 2016-04-23 NOTE — Progress Notes (Signed)
Pre visit review using our clinic review tool, if applicable. No additional management support is needed unless otherwise documented below in the visit note. 

## 2016-04-23 NOTE — Assessment & Plan Note (Signed)
Controlled, continue as is.  Labs d/w pt.  

## 2016-06-13 ENCOUNTER — Emergency Department
Admission: EM | Admit: 2016-06-13 | Discharge: 2016-06-13 | Disposition: A | Discharge status: Home / Self Care | Payer: 59 | Attending: Emergency Medicine | Admitting: Emergency Medicine

## 2016-06-13 DIAGNOSIS — I10 Essential (primary) hypertension: Secondary | ICD-10-CM | POA: Insufficient documentation

## 2016-06-13 DIAGNOSIS — R040 Epistaxis: Secondary | ICD-10-CM | POA: Insufficient documentation

## 2016-06-13 DIAGNOSIS — Z79899 Other long term (current) drug therapy: Secondary | ICD-10-CM | POA: Diagnosis not present

## 2016-06-13 LAB — CBC WITH DIFFERENTIAL/PLATELET
BASOS ABS: 0.1 10*3/uL (ref 0–0.1)
BASOS PCT: 1 %
EOS PCT: 3 %
Eosinophils Absolute: 0.3 10*3/uL (ref 0–0.7)
HCT: 43.6 % (ref 40.0–52.0)
Hemoglobin: 15.5 g/dL (ref 13.0–18.0)
LYMPHS PCT: 15 %
Lymphs Abs: 1.5 10*3/uL (ref 1.0–3.6)
MCH: 30.8 pg (ref 26.0–34.0)
MCHC: 35.5 g/dL (ref 32.0–36.0)
MCV: 86.7 fL (ref 80.0–100.0)
MONO ABS: 0.9 10*3/uL (ref 0.2–1.0)
MONOS PCT: 8 %
Neutro Abs: 7.5 10*3/uL — ABNORMAL HIGH (ref 1.4–6.5)
Neutrophils Relative %: 73 %
PLATELETS: 155 10*3/uL (ref 150–440)
RBC: 5.03 MIL/uL (ref 4.40–5.90)
RDW: 13.8 % (ref 11.5–14.5)
WBC: 10.2 10*3/uL (ref 3.8–10.6)

## 2016-06-13 LAB — BASIC METABOLIC PANEL
Anion gap: 7 (ref 5–15)
BUN: 23 mg/dL — ABNORMAL HIGH (ref 6–20)
CALCIUM: 8.5 mg/dL — AB (ref 8.9–10.3)
CO2: 24 mmol/L (ref 22–32)
Chloride: 106 mmol/L (ref 101–111)
Creatinine, Ser: 0.97 mg/dL (ref 0.61–1.24)
GFR calc Af Amer: >60 mL/min (ref >60)
GLUCOSE: 151 mg/dL — AB (ref 65–99)
Potassium: 4 mmol/L (ref 3.5–5.1)
Sodium: 137 mmol/L (ref 135–145)

## 2016-06-13 LAB — APTT: aPTT: 30 seconds (ref 24–36)

## 2016-06-13 LAB — PROTIME-INR
INR: 1.22
Prothrombin Time: 15.5 seconds — ABNORMAL HIGH (ref 11.4–15.2)

## 2016-06-13 MED ORDER — ONDANSETRON 4 MG PO TBDP
ORAL_TABLET | ORAL | Status: AC
  Filled 2016-06-13: qty 1

## 2016-06-13 MED ORDER — CLONIDINE HCL 0.1 MG PO TABS
0.1000 mg | ORAL_TABLET | Freq: Once | ORAL | Status: AC
  Administered 2016-06-13: 0.1 mg via ORAL
  Filled 2016-06-13: qty 1

## 2016-06-13 MED ORDER — OXYMETAZOLINE HCL 0.05 % NA SOLN
2.0000 | Freq: Once | NASAL | Status: AC
  Administered 2016-06-13: 2 via NASAL
  Filled 2016-06-13: qty 15

## 2016-06-13 MED ORDER — TRANEXAMIC ACID 1000 MG/10ML IV SOLN
500.0000 mg | INTRAVENOUS | Status: AC
  Administered 2016-06-13: 500 mg via TOPICAL
  Filled 2016-06-13: qty 10

## 2016-06-13 MED ORDER — ONDANSETRON HCL 4 MG/2ML IJ SOLN
4.0000 mg | Freq: Once | INTRAMUSCULAR | Status: AC
  Administered 2016-06-13: 4 mg via INTRAVENOUS
  Filled 2016-06-13: qty 2

## 2016-06-13 MED ORDER — SODIUM CHLORIDE 0.9 % IV BOLUS (SEPSIS)
1000.0000 mL | Freq: Once | INTRAVENOUS | Status: AC
  Administered 2016-06-13: 1000 mL via INTRAVENOUS

## 2016-06-13 NOTE — ED Triage Notes (Signed)
Patient ambulatory to triage with steady gait, without difficulty or distress noted; pt reports left sided nosebleed since 430am with no accomp symptoms; denies hx of same

## 2016-06-13 NOTE — ED Provider Notes (Signed)
[facility] Emergency Department Provider Note  ____________________________________________  Time seen: Approximately 7:49 AM  I have reviewed the triage vital signs and the nursing notes.   HISTORY  Chief Complaint Epistaxis    HPI [name] is a 71 y.o. male woke up at 4:30 AM with nose bleed. No trauma. No headache. No anticoagulant use. No history of recurrent nosebleeds. No recent illness. Denies any other complaints.  In addition to bleeding out of the left nostril, which she has packed with toilet paper, he is also noticing some blood running down the back of his throat. No hematemesis.     Past Medical History:  Diagnosis Date  . Hypertension   . Ulcerative colitis 5/81   Remission for years     Patient Active Problem List   Diagnosis Date Noted  . Left shoulder pain 04/23/2016  . Pneumonitis 05/20/2015  . Advance care planning 03/31/2014  . Thrombocytopenia, unspecified ([place]) 03/27/2013  . Left arm pain 03/27/2013  . Routine general medical examination at a health care facility 12/28/2011  . ED (erectile dysfunction) 12/28/2011  . Neoplasm of uncertain behavior of skin 12/27/2010  . HYPERTENSION, BENIGN ESSENTIAL 04/08/2007  . HYPERTRIGLYCERIDEMIA 01/08/2007  . GILBERT'S SYNDROME 01/08/2007  . ULCERATIVE COLITIS 01/08/2007     Past Surgical History:  Procedure Laterality Date  . CARDIAC CATHETERIZATION  03/20/01   Cardiolite EF 55% 02/10/02  . COLONOSCOPY  multiple  . INGUINAL HERNIA REPAIR  04/09/06   Bilateral  . LAPAROSCOPIC APPENDECTOMY  03/1981     Prior to Admission medications   Medication Sig Start Date End Date Taking? Authorizing Provider  Ascorbic Acid (VITAMIN C) 1000 MG tablet Take 1,000 mg by mouth daily.      Historical Provider, MD  aspirin-acetaminophen-caffeine ([place]) [phone] MG per tablet Take 1 tablet by mouth as directed. As needed     Historical Provider, MD  carvedilol  (COREG) 3.125 MG tablet Take 1 tablet (3.125 mg total) by mouth 2 (two) times daily. 04/23/16   Tonia Ghent, MD  Coenzyme Q10 (COQ10) 100 MG capsule Take 100 mg by mouth as needed.     Historical Provider, MD  GARLIC PO Take by mouth daily.      Historical Provider, MD  ibuprofen (ADVIL) 200 MG tablet Take 2 tablets (400 mg total) by mouth every 8 (eight) hours as needed (with food). 09/27/15   Tonia Ghent, MD  Omega-3 Fatty Acids ([facility] FISH OIL) 1000 MG CAPS Take 1 capsule by mouth daily. Patient taking differently: Take 1 capsule by mouth as needed.  04/01/15   Tonia Ghent, MD     Allergies Amoxicillin; Morphine sulfate; and Sulfonamide derivatives   Family History  Problem Relation Age of Onset  . Cancer Mother     ? GYN, died after hysterectomy  . Stroke Father   . Cancer Brother 70    Bladder  . Prostate cancer Brother   . Prostate cancer Brother   . Colon cancer Neg Hx   . Esophageal cancer Neg Hx   . Rectal cancer Neg Hx   . Stomach cancer Neg Hx     Social History Social History  Substance Use Topics  . Smoking status: Never Smoker  . Smokeless tobacco: Never Used  . Alcohol use No    Review of Systems Constitutional:   No fever or chills.  ENT:   No sore throat. No rhinorrhea. +epistaxis Lymphatic: No swollen glands, No extremity swelling Endocrine:  No hot/cold flashes. No significant weight change. No neck swelling. Cardiovascular:   No chest pain or syncope. Respiratory:   No dyspnea or cough. Gastrointestinal:   Negative for abdominal pain, vomiting and diarrhea.  Genitourinary:   Negative for dysuria or difficulty urinating. Musculoskeletal:   Negative for focal pain or swelling Neurological:   Negative for headaches or weakness. All other systems reviewed and are negative except as documented above in ROS and HPI.  ____________________________________________   PHYSICAL EXAM:  VITAL SIGNS: ED Triage Vitals  Enc Vitals Group     BP 06/13/16  0646 (!) 192/103     Pulse Rate 06/13/16 0646 76     Resp 06/13/16 0646 18     Temp --      Temp src --      SpO2 06/13/16 0646 97 %     Weight 06/13/16 0647 178 lb (80.7 kg)     Height 06/13/16 0647 5\' 8"  (1.727 m)     Head Circumference --      Peak Flow --      Pain Score --      Pain Loc --      Pain Edu? --      Excl. in [place]? --     Vital signs reviewed, nursing assessments reviewed.   Constitutional:   Alert and oriented. Well appearing and in no distress. Eyes:   No scleral icterus. No conjunctival pallor. PERRL. EOMI.  No nystagmus. ENT   Head:   Normocephalic and atraumatic.   Nose:   Epistaxis from left naris. Right naris is clean.   Mouth/Throat:   MMM, fresh blood in the oropharynx    Neck:   No stridor. No SubQ emphysema. No meningismus. Hematological/Lymphatic/Immunilogical:   No cervical lymphadenopathy. Cardiovascular:   RRR. Symmetric bilateral radial and DP pulses.  No murmurs.  Respiratory:   Normal respiratory effort without tachypnea nor retractions. Breath sounds are clear and equal bilaterally. No wheezes/rales/rhonchi. Gastrointestinal:   Soft and nontender. Non distended. There is no CVA tenderness.  No rebound, rigidity, or guarding. Genitourinary:   deferred Musculoskeletal:   Normal range of motion in all extremities. No joint effusions.  No lower extremity tenderness.  No edema. Neurologic:   Normal speech and language.  CN 2-10 normal. Motor grossly intact. No gross focal neurologic deficits are appreciated.  Skin:    Skin is warm, dry and intact. No rash noted.  No petechiae, purpura, or bullae.  ____________________________________________    LABS (pertinent positives/negatives) (all labs ordered are listed, but only abnormal results are displayed) Labs Reviewed  BASIC METABOLIC PANEL - Abnormal; Notable for the following:       Result Value   Glucose, Bld 151 (*)    BUN 23 (*)    Calcium 8.5 (*)    All other components  within normal limits  CBC WITH DIFFERENTIAL/PLATELET - Abnormal; Notable for the following:    Neutro Abs 7.5 (*)    All other components within normal limits  PROTIME-INR - Abnormal; Notable for the following:    Prothrombin Time 15.5 (*)    All other components within normal limits  APTT   ____________________________________________   EKG    ____________________________________________    RADIOLOGY  No results found.  ____________________________________________   PROCEDURES .Epistaxis Management Date/Time: 06/13/2016 8:06 AM Performed by: Carrie Mew Authorized by: Carrie Mew   Consent:    Consent obtained:  Verbal   Consent given by:  Patient   Risks discussed:  Bleeding, infection, nasal injury and pain   Alternatives discussed:  Observation and no treatment Anesthesia (see MAR for exact dosages):    Anesthesia method:  None Procedure details:    Treatment site:  L anterior   Repair method: clamping for >31minutes, then Afrin + TXA topical with clamping.   Treatment complexity:  Extensive   Treatment episode: initial   Post-procedure details:    Assessment:  Bleeding stopped   Patient tolerance of procedure:  Tolerated well, no immediate complications Comments:     See progress updates below     ____________________________________________   INITIAL IMPRESSION / ASSESSMENT AND PLAN / ED COURSE  Pertinent labs & imaging results that were available during my care of the patient were reviewed by me and considered in my medical decision making (see chart for details).    Clinical Course as of Jun 13 1041  Wed Jun 13, 2016  0735 P/w epistaxis.  Likely anterior/venous. Failed 20+ minutes of clamping. Will give afrin + TXA  [PS]  0833 Bleeding stopped. Removed all gauze. Reexamined, finding clear area, about 33mm diameter, of mucosal abrasion in left anterolateral naris. Likely nose picking injury.  [PS]  1040 Bleeding still stopped. Will  DC.   [PS]    Clinical Course User Index [PS] Carrie Mew, MD     ____________________________________________   FINAL CLINICAL IMPRESSION(S) / ED DIAGNOSES  Final diagnoses:  Anterior epistaxis      New Prescriptions   No medications on file     Portions of this note were generated with dragon dictation software. Dictation errors may occur despite best attempts at proofreading.    Carrie Mew, MD 06/13/16 1043

## 2016-06-13 NOTE — ED Notes (Signed)
Pt verbalized understanding of discharge instructions. NAD at this time. 

## 2017-02-20 ENCOUNTER — Telehealth: Payer: Self-pay | Admitting: Family Medicine

## 2017-02-20 DIAGNOSIS — Z8042 Family history of malignant neoplasm of prostate: Secondary | ICD-10-CM

## 2017-02-20 NOTE — Telephone Encounter (Signed)
Copied from [place] [id number]. Topic: Quick Communication - See Telephone Encounter >> Feb 20, 2017 11:45 AM Ivar Drape wrote: CRM for notification. See Telephone encounter for:  02/20/17. Patient is having a CPE on 04/25/17 and he needs a prescription for his labs.

## 2017-02-21 ENCOUNTER — Encounter: Payer: Self-pay | Admitting: *Deleted

## 2017-02-21 DIAGNOSIS — Z8042 Family history of malignant neoplasm of prostate: Secondary | ICD-10-CM | POA: Insufficient documentation

## 2017-02-21 NOTE — Telephone Encounter (Signed)
rx done for cmet/lipid and PSA (dx E78.1 and Z12.5 respectively).  Thanks.

## 2017-02-21 NOTE — Telephone Encounter (Signed)
Written and mailed to patient.

## 2017-04-18 ENCOUNTER — Telehealth: Payer: Self-pay | Admitting: Family Medicine

## 2017-04-18 NOTE — Telephone Encounter (Signed)
Copied from [place]. Topic: Inquiry >> Apr 18, 2017 10:51 AM Cecelia Byars, NT wrote: Reason for CRM: Patient called and said he  has misplaced his paperwork for getting his labs done prior to his 04/25/17 visit and would like a copy for ready fo pickup

## 2017-04-18 NOTE — Telephone Encounter (Signed)
Wrong office

## 2017-04-19 ENCOUNTER — Telehealth: Payer: Self-pay | Admitting: Family Medicine

## 2017-04-19 NOTE — Telephone Encounter (Signed)
Spoke to pt and apologized for the inconvenience this may have caused him. He asked that I fax the letter to [phone].

## 2017-04-19 NOTE — Telephone Encounter (Signed)
[place]- Letter printed.    Erin- please check as to why this was routed to [employer] and why it wasn't routed back to me from [employer].  Thanks.

## 2017-04-19 NOTE — Telephone Encounter (Signed)
Pt called on 2/28 requesting a copy of the labs he needs to have done. Per the telephone encounter with the [facility] the request was routed to the [place] office instead of here. Pt came by to pick up the papers this morning and informed him that they were not ready, and we would call him when it is.

## 2017-04-23 ENCOUNTER — Other Ambulatory Visit: Payer: Self-pay | Admitting: Family Medicine

## 2017-04-24 LAB — COMPREHENSIVE METABOLIC PANEL
A/G RATIO: 1.7 (ref 1.2–2.2)
ALK PHOS: 78 IU/L (ref 39–117)
ALT: 23 IU/L (ref 0–44)
AST: 27 IU/L (ref 0–40)
Albumin: 3.8 g/dL (ref 3.5–4.8)
BILIRUBIN TOTAL: 1.8 mg/dL — AB (ref 0.0–1.2)
BUN/Creatinine Ratio: 12 (ref 10–24)
BUN: 13 mg/dL (ref 8–27)
CHLORIDE: 105 mmol/L (ref 96–106)
CO2: 24 mmol/L (ref 20–29)
Calcium: 8.5 mg/dL — ABNORMAL LOW (ref 8.6–10.2)
Creatinine, Ser: 1.07 mg/dL (ref 0.76–1.27)
GFR calc Af Amer: 80 mL/min/{1.73_m2} (ref >59)
GFR calc non Af Amer: 69 mL/min/{1.73_m2} (ref >59)
GLOBULIN, TOTAL: 2.3 g/dL (ref 1.5–4.5)
Glucose: 99 mg/dL (ref 65–99)
POTASSIUM: 4 mmol/L (ref 3.5–5.2)
SODIUM: 142 mmol/L (ref 134–144)
Total Protein: 6.1 g/dL (ref 6.0–8.5)

## 2017-04-24 LAB — PSA: Prostate Specific Ag, Serum: 1.8 ng/mL (ref 0.0–4.0)

## 2017-04-24 LAB — LIPID PANEL W/O CHOL/HDL RATIO
CHOLESTEROL TOTAL: 137 mg/dL (ref 100–199)
HDL: 30 mg/dL — AB (ref >39)
LDL Calculated: 48 mg/dL (ref 0–99)
TRIGLYCERIDES: 294 mg/dL — AB (ref 0–149)
VLDL Cholesterol Cal: 59 mg/dL — ABNORMAL HIGH (ref 5–40)

## 2017-04-24 LAB — SPECIMEN STATUS REPORT

## 2017-04-25 ENCOUNTER — Ambulatory Visit (INDEPENDENT_AMBULATORY_CARE_PROVIDER_SITE_OTHER): Payer: Managed Care, Other (non HMO) | Admitting: Family Medicine

## 2017-04-25 ENCOUNTER — Encounter: Payer: Self-pay | Admitting: Family Medicine

## 2017-04-25 VITALS — BP 142/76 | HR 66 | Temp 98.6°F | Ht 68.0 in | Wt 182.0 lb

## 2017-04-25 DIAGNOSIS — Z0000 Encounter for general adult medical examination without abnormal findings: Secondary | ICD-10-CM | POA: Diagnosis not present

## 2017-04-25 DIAGNOSIS — E781 Pure hyperglyceridemia: Secondary | ICD-10-CM

## 2017-04-25 DIAGNOSIS — I10 Essential (primary) hypertension: Secondary | ICD-10-CM

## 2017-04-25 DIAGNOSIS — Z7189 Other specified counseling: Secondary | ICD-10-CM

## 2017-04-25 MED ORDER — CVS FISH OIL 1000 MG PO CAPS: 1.0000 | ORAL_CAPSULE | Freq: Every day | ORAL | Status: DC

## 2017-04-25 MED ORDER — CARVEDILOL 3.125 MG PO TABS: 3.1250 mg | ORAL_TABLET | Freq: Two times a day (BID) | ORAL | 3 refills | Status: DC

## 2017-04-25 NOTE — Patient Instructions (Addendum)
Ask your wife about your memory and if she notes a change then update me.  Get back on the fish oil.   Don't change your other meds.  Take care.  Glad to see you.  I would get a flu and pneumonia shot.  The tetanus shot may be cheaper at the pharmacy.

## 2017-04-25 NOTE — Progress Notes (Signed)
CPE- See plan.  Routine anticipatory guidance given to patient.  See health maintenance.  The possibility exists that previously documented standard health maintenance information may have been brought forward from a previous encounter into this note.  If needed, that same information has been updated to reflect the current situation based on today's encounter.    Flu encouraged Shingles d/w pt.  PNA encouraged  Tetanus 2008.   Colonoscopy 2015 Prostate cancer screening- PSA wnl 2019, d/w pt.  Advance directive- wife designated if patient were incapacitated.  HCV screening prev neg, d/w pt.   Hypertension:    Using medication without problems or lightheadedness: yes Chest pain with exertion:no Edema:no Short of breath:no  D/w pt about getting back on fish oil for TG. He had been off med.  No ADE on med prev. Labs d/w pt.    Memory status d/w pt.  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  He has had trouble with recalling names of people.  He can still function with work.  He wants to slow down with work.  He is considering options. Knows the year, month, date.  3/3 attention.  Can do math.  3/3 recall.  WORLD---->DLROW.  Normal testing today. He doesn't have red flag events or sx.  His wife thought the changes were not severe, according to patient report.    No falls.    PMH and SH reviewed  Meds, vitals, and allergies reviewed.   ROS: Per HPI.  Unless specifically indicated otherwise in HPI, the patient denies:  General: fever. Eyes: acute vision changes ENT: sore throat Cardiovascular: chest pain Respiratory: SOB GI: vomiting GU: dysuria Musculoskeletal: acute back pain Derm: acute rash Neuro: acute motor dysfunction Psych: worsening mood Endocrine: polydipsia Heme: bleeding Allergy: hayfever  GEN: nad, alert and oriented HEENT: mucous membranes moist NECK: supple w/o LA CV: rrr. PULM: ctab, no inc wob ABD: soft, +bs EXT: no edema SKIN: no acute rash

## 2017-04-28 NOTE — Assessment & Plan Note (Signed)
Reasonable control.  Continue as is.  Labs discussed with patient.  He agrees.

## 2017-04-28 NOTE — Assessment & Plan Note (Signed)
D/w pt about getting back on fish oil for TG. Labs d/w pt.

## 2017-04-28 NOTE — Assessment & Plan Note (Signed)
Advance directive- wife designated if patient were incapacitated.  

## 2017-04-28 NOTE — Assessment & Plan Note (Addendum)
Flu encouraged Shingles d/w pt.  PNA encouraged  Tetanus 2008.   Colonoscopy 2015 Prostate cancer screening- PSA wnl 2019, d/w pt.  Advance directive- wife designated if patient were incapacitated.  HCV screening prev neg, d/w pt.   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  He has had trouble with recalling names of people.  He can still function with work.  He wants to slow down with work.  He is considering options. Knows the year, month, date.  3/3 attention.  Can do math.  3/3 recall.  WORLD---->[place].  Normal testing today. He doesn't have red flag events or sx.  His wife thought the changes were not severe, according to patient report.   He'll monitor and update me as needed.  See avs.

## 2017-06-21 ENCOUNTER — Encounter: Payer: Self-pay | Admitting: *Deleted

## 2017-06-21 NOTE — Telephone Encounter (Signed)
This encounter was created in error - please disregard.

## 2017-06-26 ENCOUNTER — Encounter: Payer: Self-pay | Admitting: Family Medicine

## 2017-06-26 ENCOUNTER — Ambulatory Visit (INDEPENDENT_AMBULATORY_CARE_PROVIDER_SITE_OTHER): Payer: Managed Care, Other (non HMO) | Admitting: Family Medicine

## 2017-06-26 ENCOUNTER — Telehealth: Payer: Self-pay

## 2017-06-26 VITALS — BP 128/84 | HR 62 | Temp 98.7°F | Ht 68.0 in | Wt 185.8 lb

## 2017-06-26 DIAGNOSIS — R1013 Epigastric pain: Secondary | ICD-10-CM

## 2017-06-26 DIAGNOSIS — R1084 Generalized abdominal pain: Secondary | ICD-10-CM | POA: Diagnosis not present

## 2017-06-26 LAB — POC URINALSYSI DIPSTICK (AUTOMATED)
BILIRUBIN UA: NEGATIVE
Blood, UA: NEGATIVE
GLUCOSE UA: NEGATIVE
Ketones, UA: NEGATIVE
Leukocytes, UA: NEGATIVE
NITRITE UA: NEGATIVE
PH UA: 6 (ref 5.0–8.0)
Protein, UA: NEGATIVE
Spec Grav, UA: 1.025 (ref 1.010–1.025)
Urobilinogen, UA: 0.2 E.U./dL

## 2017-06-26 LAB — CBC WITH DIFFERENTIAL/PLATELET
BASOS PCT: 0.8 % (ref 0.0–3.0)
Basophils Absolute: 0 10*3/uL (ref 0.0–0.1)
EOS PCT: 4.5 % (ref 0.0–5.0)
Eosinophils Absolute: 0.3 10*3/uL (ref 0.0–0.7)
HEMATOCRIT: 43 % (ref 39.0–52.0)
HEMOGLOBIN: 15.4 g/dL (ref 13.0–17.0)
Lymphocytes Relative: 18.9 % (ref 12.0–46.0)
Lymphs Abs: 1.1 10*3/uL (ref 0.7–4.0)
MCHC: 35.7 g/dL (ref 30.0–36.0)
MCV: 86.7 fl (ref 78.0–100.0)
MONOS PCT: 9.4 % (ref 3.0–12.0)
Monocytes Absolute: 0.5 10*3/uL (ref 0.1–1.0)
Neutro Abs: 3.8 10*3/uL (ref 1.4–7.7)
Neutrophils Relative %: 66.4 % (ref 43.0–77.0)
Platelets: 135 10*3/uL — ABNORMAL LOW (ref 150.0–400.0)
RBC: 4.96 Mil/uL (ref 4.22–5.81)
RDW: 13.7 % (ref 11.5–15.5)
WBC: 5.7 10*3/uL (ref 4.0–10.5)

## 2017-06-26 LAB — COMPREHENSIVE METABOLIC PANEL
ALBUMIN: 3.9 g/dL (ref 3.5–5.2)
ALK PHOS: 69 U/L (ref 39–117)
ALT: 16 U/L (ref 0–53)
AST: 21 U/L (ref 0–37)
BUN: 15 mg/dL (ref 6–23)
CHLORIDE: 105 meq/L (ref 96–112)
CO2: 31 mEq/L (ref 19–32)
Calcium: 9.1 mg/dL (ref 8.4–10.5)
Creatinine, Ser: 1.05 mg/dL (ref 0.40–1.50)
GFR: 73.55 mL/min (ref >60.00)
Glucose, Bld: 91 mg/dL (ref 70–99)
POTASSIUM: 4.3 meq/L (ref 3.5–5.1)
Sodium: 141 mEq/L (ref 135–145)
TOTAL PROTEIN: 6.6 g/dL (ref 6.0–8.3)
Total Bilirubin: 2 mg/dL — ABNORMAL HIGH (ref 0.2–1.2)

## 2017-06-26 LAB — LIPASE: Lipase: 43 U/L (ref 11.0–59.0)

## 2017-06-26 NOTE — Telephone Encounter (Signed)
Copied from [place] [phone]. Topic: General - Other >> Jun 26, 2017  3:26 PM Neva Seat wrote: Pt wife call regarding he husband's lab that was done for him today.  She states the test samples will need to go to [facility] because there is no charge for labs through her insurance.  [name] Edison at [phone]

## 2017-06-26 NOTE — Assessment & Plan Note (Addendum)
That can radiate to the RUQ.  Benign exam now.  Prev colonoscopy d/w pt.  H/o appendectomy.  D/w pt about relevant anatomy.  Check labs, check u/s re: gall bladder.  May end up needing HIDA scan vs GI eval.  Okay for outpatient f/u.  He agrees.  No concern for cardiac source.  He can have sx at rest and then he can exert w/o any sx at all.  >25 minutes spent in face to face time with patient, >50% spent in counselling or coordination of care.

## 2017-06-26 NOTE — Progress Notes (Signed)
abd pain. Center of upper abd to RUQ.  Episodic pain.  No position or med helps.  Last 4-5 hours.  Has been going on for a few months.  Sig pain when it happens.  Happens once about every 2 weeks.  No clear triggers with foods.  No fevers or chills.  He'll get nauseated and vomit with the episodes.  Bloated during the episodes.  No blood in urine or stools.  He noted urinary frequency during the episodes but not o/w.  No other rash, no bleeding.  Some fatigue, less motivated recently.  Still sleeping well.  He feels well right now.  This didn't feel like prev UC flare, d/w pt. No smoking, no etoh. Episodes happen at rest- he clearly doesn't have exertional sx.    H/o appendectomy.    Prev colonoscopy 2015.  1. Moderate diverticulosis was noted in the sigmoid colon 2. The colon mucosa was otherwise normal - excellent prep 3. Moderate sized internal hemorrhoids  PMH and SH reviewed  ROS: Per HPI unless specifically indicated in ROS section   Meds, vitals, and allergies reviewed.   GEN: nad, alert and oriented HEENT: mucous membranes moist NECK: supple w/o LA CV: rrr. PULM: ctab, no inc wob ABD: soft, +bs, not ttp, no rebound.  EXT: no edema SKIN: no acute rash, no jaundice.

## 2017-06-26 NOTE — Telephone Encounter (Signed)
Labs have already been done.

## 2017-06-26 NOTE — Patient Instructions (Signed)
Go to the lab on the way out.  We'll contact you with your lab report. We will call about your referral.  Rosaria Ferries or Azalee Course will call you if you don't see one of them on the way out.  Take care.  Glad to see you.  Update me as needed.

## 2017-07-03 ENCOUNTER — Ambulatory Visit (HOSPITAL_COMMUNITY)
Admission: RE | Admit: 2017-07-03 | Discharge: 2017-07-03 | Disposition: A | Discharge status: Home / Self Care | Payer: Managed Care, Other (non HMO) | Source: Ambulatory Visit | Attending: Family Medicine | Admitting: Family Medicine

## 2017-07-03 DIAGNOSIS — K8020 Calculus of gallbladder without cholecystitis without obstruction: Secondary | ICD-10-CM

## 2017-07-03 DIAGNOSIS — K828 Other specified diseases of gallbladder: Secondary | ICD-10-CM | POA: Insufficient documentation

## 2017-07-03 DIAGNOSIS — K760 Fatty (change of) liver, not elsewhere classified: Secondary | ICD-10-CM | POA: Insufficient documentation

## 2017-07-03 DIAGNOSIS — N281 Cyst of kidney, acquired: Secondary | ICD-10-CM

## 2017-07-03 DIAGNOSIS — K8066 Calculus of gallbladder and bile duct with acute and chronic cholecystitis without obstruction: Secondary | ICD-10-CM | POA: Diagnosis not present

## 2017-07-03 DIAGNOSIS — R1013 Epigastric pain: Secondary | ICD-10-CM

## 2017-07-03 DIAGNOSIS — K8080 Other cholelithiasis without obstruction: Secondary | ICD-10-CM | POA: Diagnosis not present

## 2017-07-04 ENCOUNTER — Other Ambulatory Visit: Payer: Self-pay | Admitting: Family Medicine

## 2017-07-04 ENCOUNTER — Telehealth: Payer: Self-pay

## 2017-07-04 ENCOUNTER — Telehealth: Payer: Self-pay | Admitting: Family Medicine

## 2017-07-04 DIAGNOSIS — K8020 Calculus of gallbladder without cholecystitis without obstruction: Secondary | ICD-10-CM

## 2017-07-04 NOTE — Telephone Encounter (Signed)
Patient advised.

## 2017-07-04 NOTE — Telephone Encounter (Signed)
ER.  This is a stoic gentleman with a likely high pain tolerance.  If it is bad enough to need meds other than OTC meds, then I would have him go to the ER.  If his GB/situation is in the process of getting worse, then I wouldn't try to mask it with pain meds at home.

## 2017-07-04 NOTE — Telephone Encounter (Signed)
Left detailed message on voicemail.  

## 2017-07-04 NOTE — Telephone Encounter (Signed)
Copied from [place] [phone]. Topic: General - Other >> Jul 04, 2017  9:18 AM Billy Kim wrote: Reason for CRM: patient wife Billy Kim [phone] calling stating that all labs need to go to [facility] because they get it done for free they do not want any in house labs done in the office  also pt doesn't have [facility]  >> Jul 04, 2017  9:27 AM Billy Kim wrote: Pt wife Billy Kim also states that her husband been having abd pain still and would like something for pain called into his pharmacy [facility] [phone]

## 2017-07-04 NOTE — Telephone Encounter (Signed)
Patient needs to tell the lab tech at the time of draw that lab work needs to go to [facility]. Nowhere to store that information.

## 2017-07-04 NOTE — Telephone Encounter (Signed)
Patient states that he needs something for pain while awaiting the appointment with surgery.  Patient states the OTC meds are doing nothing for it.

## 2017-07-04 NOTE — Telephone Encounter (Signed)
If sig pain, then to ER.  See u/s report notes.  Thanks.

## 2017-07-04 NOTE — Telephone Encounter (Signed)
Copied from [place] [id number]. Topic: General - Other >> Jul 04, 2017  9:23 AM Yvette Rack wrote: Reason for CRM: pt wife Vickii Chafe calling about U/S results

## 2017-07-05 ENCOUNTER — Inpatient Hospital Stay (HOSPITAL_COMMUNITY)
Admission: EM | Admit: 2017-07-05 | Discharge: 2017-07-10 | DRG: 418 | Disposition: A | Discharge status: Home / Self Care | Payer: Managed Care, Other (non HMO) | Attending: Surgery | Admitting: Surgery

## 2017-07-05 ENCOUNTER — Encounter (HOSPITAL_COMMUNITY): Payer: Self-pay

## 2017-07-05 ENCOUNTER — Emergency Department (HOSPITAL_COMMUNITY): Payer: Managed Care, Other (non HMO)

## 2017-07-05 ENCOUNTER — Other Ambulatory Visit: Payer: Self-pay

## 2017-07-05 DIAGNOSIS — Z823 Family history of stroke: Secondary | ICD-10-CM

## 2017-07-05 DIAGNOSIS — K8050 Calculus of bile duct without cholangitis or cholecystitis without obstruction: Secondary | ICD-10-CM | POA: Diagnosis not present

## 2017-07-05 DIAGNOSIS — K8066 Calculus of gallbladder and bile duct with acute and chronic cholecystitis without obstruction: Secondary | ICD-10-CM | POA: Diagnosis present

## 2017-07-05 DIAGNOSIS — Z885 Allergy status to narcotic agent status: Secondary | ICD-10-CM | POA: Diagnosis not present

## 2017-07-05 DIAGNOSIS — K8080 Other cholelithiasis without obstruction: Secondary | ICD-10-CM

## 2017-07-05 DIAGNOSIS — I10 Essential (primary) hypertension: Secondary | ICD-10-CM | POA: Diagnosis present

## 2017-07-05 DIAGNOSIS — E781 Pure hyperglyceridemia: Secondary | ICD-10-CM | POA: Diagnosis present

## 2017-07-05 DIAGNOSIS — R945 Abnormal results of liver function studies: Secondary | ICD-10-CM

## 2017-07-05 DIAGNOSIS — K660 Peritoneal adhesions (postprocedural) (postinfection): Secondary | ICD-10-CM | POA: Diagnosis present

## 2017-07-05 DIAGNOSIS — Z882 Allergy status to sulfonamides status: Secondary | ICD-10-CM

## 2017-07-05 DIAGNOSIS — Z8042 Family history of malignant neoplasm of prostate: Secondary | ICD-10-CM | POA: Diagnosis not present

## 2017-07-05 DIAGNOSIS — R7989 Other specified abnormal findings of blood chemistry: Secondary | ICD-10-CM

## 2017-07-05 DIAGNOSIS — Z881 Allergy status to other antibiotic agents status: Secondary | ICD-10-CM

## 2017-07-05 DIAGNOSIS — N179 Acute kidney failure, unspecified: Secondary | ICD-10-CM | POA: Diagnosis present

## 2017-07-05 DIAGNOSIS — Z8249 Family history of ischemic heart disease and other diseases of the circulatory system: Secondary | ICD-10-CM

## 2017-07-05 DIAGNOSIS — R402412 Glasgow coma scale score 13-15, at arrival to emergency department: Secondary | ICD-10-CM | POA: Diagnosis present

## 2017-07-05 DIAGNOSIS — K8020 Calculus of gallbladder without cholecystitis without obstruction: Secondary | ICD-10-CM

## 2017-07-05 LAB — CBC WITH DIFFERENTIAL/PLATELET
Abs Immature Granulocytes: 0 10*3/uL (ref 0.0–0.1)
BASOS ABS: 0.1 10*3/uL (ref 0.0–0.1)
Basophils Relative: 1 %
EOS ABS: 0.3 10*3/uL (ref 0.0–0.7)
EOS PCT: 3 %
HCT: 45 % (ref 39.0–52.0)
Hemoglobin: 16 g/dL (ref 13.0–17.0)
Immature Granulocytes: 0 %
Lymphocytes Relative: 10 %
Lymphs Abs: 1 10*3/uL (ref 0.7–4.0)
MCH: 30.4 pg (ref 26.0–34.0)
MCHC: 35.6 g/dL (ref 30.0–36.0)
MCV: 85.6 fL (ref 78.0–100.0)
Monocytes Absolute: 0.8 10*3/uL (ref 0.1–1.0)
Monocytes Relative: 8 %
Neutro Abs: 7.9 10*3/uL — ABNORMAL HIGH (ref 1.7–7.7)
Neutrophils Relative %: 78 %
Platelets: 138 10*3/uL — ABNORMAL LOW (ref 150–400)
RBC: 5.26 MIL/uL (ref 4.22–5.81)
RDW: 13.2 % (ref 11.5–15.5)
WBC: 10 10*3/uL (ref 4.0–10.5)

## 2017-07-05 LAB — COMPREHENSIVE METABOLIC PANEL
ALT: 40 U/L (ref 17–63)
AST: 78 U/L — AB (ref 15–41)
Albumin: 3.9 g/dL (ref 3.5–5.0)
Alkaline Phosphatase: 95 U/L (ref 38–126)
Anion gap: 7 (ref 5–15)
BUN: 18 mg/dL (ref 6–20)
CALCIUM: 9.1 mg/dL (ref 8.9–10.3)
CHLORIDE: 109 mmol/L (ref 101–111)
CO2: 26 mmol/L (ref 22–32)
CREATININE: 1.29 mg/dL — AB (ref 0.61–1.24)
GFR calc Af Amer: >60 mL/min (ref >60)
GFR, EST NON AFRICAN AMERICAN: 53 mL/min — AB (ref >60)
Glucose, Bld: 122 mg/dL — ABNORMAL HIGH (ref 65–99)
POTASSIUM: 4.2 mmol/L (ref 3.5–5.1)
SODIUM: 142 mmol/L (ref 135–145)
Total Bilirubin: 2.7 mg/dL — ABNORMAL HIGH (ref 0.3–1.2)
Total Protein: 7 g/dL (ref 6.5–8.1)

## 2017-07-05 LAB — URINALYSIS, ROUTINE W REFLEX MICROSCOPIC
BILIRUBIN URINE: NEGATIVE
Glucose, UA: NEGATIVE mg/dL
HGB URINE DIPSTICK: NEGATIVE
KETONES UR: NEGATIVE mg/dL
Leukocytes, UA: NEGATIVE
NITRITE: NEGATIVE
PH: 7 (ref 5.0–8.0)
Protein, ur: NEGATIVE mg/dL
Specific Gravity, Urine: 1.012 (ref 1.005–1.030)

## 2017-07-05 LAB — LIPASE, BLOOD: LIPASE: 46 U/L (ref 11–51)

## 2017-07-05 MED ORDER — PIPERACILLIN-TAZOBACTAM 3.375 G IVPB 30 MIN
3.3750 g | Freq: Once | INTRAVENOUS | Status: AC
  Administered 2017-07-05: 3.375 g via INTRAVENOUS
  Filled 2017-07-05: qty 50

## 2017-07-05 MED ORDER — FENTANYL CITRATE (PF) 100 MCG/2ML IJ SOLN
50.0000 ug | Freq: Once | INTRAMUSCULAR | Status: AC
  Administered 2017-07-05: 50 ug via NASAL
  Filled 2017-07-05: qty 2

## 2017-07-05 MED ORDER — FENTANYL CITRATE (PF) 100 MCG/2ML IJ SOLN
50.0000 ug | Freq: Once | INTRAMUSCULAR | Status: AC
Start: 2017-07-05 — End: 2017-07-05
  Administered 2017-07-05: 50 ug via INTRAVENOUS
  Filled 2017-07-05: qty 2

## 2017-07-05 MED ORDER — PIPERACILLIN-TAZOBACTAM 3.375 G IVPB
3.3750 g | Freq: Three times a day (TID) | INTRAVENOUS | Status: DC
  Administered 2017-07-06 – 2017-07-10 (×13): 3.375 g via INTRAVENOUS
  Filled 2017-07-05 (×17): qty 50

## 2017-07-05 NOTE — Progress Notes (Signed)
Pharmacy Antibiotic Note  Elchanan Bob Isensee is a 72 y.o. male admitted on 07/05/2017 with abdominal pain.  Pharmacy has been consulted for zosyn dosing. Pt is afebrile and WBC Is WNL. Noted amoxicillin allergy but reaction is only an intolerance.   Plan: Zosyn 3.375gm IV Q8H (4 hr inf) F/u renal fxn, C&S, clinical status *Pharmacy will sign off as no dose adjustments are anticipated. Thank you for the consult!  Height: 5\' 8"  (172.7 cm) Weight: 185 lb (83.9 kg) IBW/kg (Calculated) : 68.4  Temp (24hrs), Avg:97.5 F (36.4 C), Min:97.5 F (36.4 C), Max:97.5 F (36.4 C)  Recent Labs  Lab 07/05/17 1905  WBC 10.0  CREATININE 1.29*    Estimated Creatinine Clearance: 53.8 mL/min (A) (by C-G formula based on SCr of 1.29 mg/dL (H)).    Allergies  Allergen Reactions  . Amoxicillin Other (See Comments)    Caused headache Has patient had a PCN reaction causing immediate rash, facial/tongue/throat swelling, SOB or lightheadedness with hypotension: No Has patient had a PCN reaction causing severe rash involving mucus membranes or skin necrosis: No Has patient had a PCN reaction that required hospitalization: No Has patient had a PCN reaction occurring within the last 10 years: No If all of the above answers are "NO", then may proceed with Cephalosporin use.  Marland Kitchen Morphine Sulfate Other (See Comments)    headache  . Sulfonamide Derivatives Other (See Comments)    headache    Arif Amendola, Rande Lawman 07/05/2017 10:18 PM

## 2017-07-05 NOTE — ED Provider Notes (Signed)
[place]anes of intermittent right upper quadrant pain for the past 2 months.  He is feeling well since treatment here.  Patient alert Glasgow Coma Score 15 and appears in no distress.   Billy Dakin, MD 07/05/17 [phone]

## 2017-07-05 NOTE — ED Triage Notes (Signed)
Pt reports he is having gallstones. ED PA at bedside.

## 2017-07-05 NOTE — ED Notes (Signed)
Pt complaining of pain  

## 2017-07-05 NOTE — ED Provider Notes (Signed)
[facility] EMERGENCY DEPARTMENT Provider Note   CSN: [id number] Arrival date & time: 07/05/17  1742     History   Chief Complaint Chief Complaint  Patient presents with  . Abdominal Pain    HPI Billy Kim is a 72 y.o. male.  HPI  Patient is a 72 year old male with known cholelithiasis via ultrasound conducted 2 days ago who comes today complaining of right upper quadrant abdominal pain that is intermittent and cramping in nature.  Patient denies any fevers or chills endorses some nausea without vomiting.  Patient was told that if his pain was to recur he was to come to the emergency department for further evaluation.  Previous ultrasound showed gallbladder wall thickening with sludge and stones but no evidence of cholecystitis.  Patient has been afebrile.  Patient was scheduled for outpatient evaluation for surgery later this week.  Past Medical History:  Diagnosis Date  . Hypertension   . Ulcerative colitis 5/81   Remission for years    Patient Active Problem List   Diagnosis Date Noted  . Choledocholithiasis 07/05/2017  . Epigastric pain 06/26/2017  . FH: prostate cancer 02/21/2017  . Left shoulder pain 04/23/2016  . Pneumonitis 05/20/2015  . Advance care planning 03/31/2014  . Thrombocytopenia, unspecified ([place]) 03/27/2013  . Left arm pain 03/27/2013  . Routine general medical examination at a health care facility 12/28/2011  . ED (erectile dysfunction) 12/28/2011  . Neoplasm of uncertain behavior of skin 12/27/2010  . HYPERTENSION, BENIGN ESSENTIAL 04/08/2007  . HYPERTRIGLYCERIDEMIA 01/08/2007  . GILBERT'S SYNDROME 01/08/2007  . ULCERATIVE COLITIS 01/08/2007    Past Surgical History:  Procedure Laterality Date  . CARDIAC CATHETERIZATION  03/20/01   Cardiolite EF 55% 02/10/02  . COLONOSCOPY  multiple  . INGUINAL HERNIA REPAIR  04/09/06   Bilateral  . LAPAROSCOPIC APPENDECTOMY  03/1981        Home Medications    Prior to Admission  medications   Medication Sig Start Date End Date Taking? Authorizing Provider  Ascorbic Acid (VITAMIN C) 1000 MG tablet Take 1,000 mg by mouth daily.     Yes [provider]  aspirin-acetaminophen-caffeine (EXCEDRIN EXTRA STRENGTH) [id number] MG per tablet Take 1 tablet by mouth daily as needed (pain). As needed    Yes [provider]  carvedilol (COREG) 3.125 MG tablet Take 1 tablet (3.125 mg total) by mouth 2 (two) times daily. 04/25/17  Yes Tonia Ghent, MD  Coenzyme Q10 (COQ10) 100 MG capsule Take 100 mg by mouth daily.    Yes [provider]  Garlic 149 MG TABS Take 500 mg by mouth daily.   Yes [provider]  ibuprofen (ADVIL) 200 MG tablet Take 2 tablets (400 mg total) by mouth every 8 (eight) hours as needed (with food). Patient taking differently: Take 400 mg by mouth daily as needed (pain (take with food)).  09/27/15  Yes Tonia Ghent, MD  Omega-3 Fatty Acids ([employer] FISH OIL) 1000 MG CAPS Take 1 capsule by mouth daily. Patient taking differently: Take 1,000 mg by mouth daily.  04/25/17  Yes Tonia Ghent, MD    Family History Family History  Problem Relation Age of Onset  . Cancer Mother        ? GYN, died after hysterectomy  . Stroke Father   . Cancer Brother 70       Bladder  . Prostate cancer Brother   . Prostate cancer Brother   . Colon cancer Neg Hx   .  Esophageal cancer Neg Hx   . Rectal cancer Neg Hx   . Stomach cancer Neg Hx     Social History Social History   Tobacco Use  . Smoking status: Never Smoker  . Smokeless tobacco: Never Used  Substance Use Topics  . Alcohol use: No  . Drug use: No     Allergies   Amoxicillin; Morphine sulfate; and Sulfonamide derivatives   Review of Systems Review of Systems  Constitutional: Negative for chills, fatigue and fever.  Gastrointestinal: Positive for abdominal pain and nausea. Negative for blood in stool, diarrhea and vomiting.  All other systems reviewed and are  negative.    Physical Exam Updated Vital Signs BP (!) 156/84   Pulse 72   Temp (!) 97.5 F (36.4 C) (Oral)   Resp 15   Ht 5\' 8"  (1.727 m)   Wt 83.9 kg (185 lb)   SpO2 99%   BMI 28.13 kg/m   Physical Exam  Constitutional: He appears well-developed and well-nourished.  HENT:  Head: Normocephalic and atraumatic.  Eyes: Conjunctivae are normal.  Neck: Neck supple.  Cardiovascular: Normal rate and regular rhythm.  No murmur heard. Pulmonary/Chest: Effort normal and breath sounds normal. No respiratory distress.  Abdominal: Soft. There is tenderness in the right upper quadrant. There is positive Murphy's sign.  Musculoskeletal: He exhibits no edema.  Neurological: He is alert.  Skin: Skin is warm and dry.  Psychiatric: He has a normal mood and affect.  Nursing note and vitals reviewed.    ED Treatments / Results  Labs (all labs ordered are listed, but only abnormal results are displayed) Labs Reviewed  COMPREHENSIVE METABOLIC PANEL - Abnormal; Notable for the following components:      Result Value   Glucose, Bld 122 (*)    Creatinine, Ser 1.29 (*)    AST 78 (*)    Total Bilirubin 2.7 (*)    GFR calc non Af Amer 53 (*)    All other components within normal limits  CBC WITH DIFFERENTIAL/PLATELET - Abnormal; Notable for the following components:   Platelets 138 (*)    Neutro Abs 7.9 (*)    All other components within normal limits  LIPASE, BLOOD  URINALYSIS, ROUTINE W REFLEX MICROSCOPIC    EKG None  Radiology Dg Chest 2 View  Result Date: 07/05/2017 CLINICAL DATA:  Preoperative chest x-ray. EXAM: CHEST - 2 VIEW COMPARISON:  Chest x-ray dated May 20, 2015. FINDINGS: The heart size and mediastinal contours are within normal limits. Normal pulmonary vascularity. New nodular density in the left lower lobe. No focal consolidation, pleural effusion, or pneumothorax. No acute osseous abnormality. IMPRESSION: 1. New nodular density in the left lower lobe may represent  focal atelectasis, however a lung nodule is not excluded. Recommend noncontrast chest CT for further evaluation. Electronically Signed   By: Titus Dubin M.D.   On: 07/05/2017 19:47    Procedures Procedures (including critical care time)  Medications Ordered in ED Medications  piperacillin-tazobactam (ZOSYN) IVPB 3.375 g (has no administration in time range)  piperacillin-tazobactam (ZOSYN) IVPB 3.375 g (has no administration in time range)  fentaNYL (SUBLIMAZE) injection 50 mcg (50 mcg Nasal Given 07/05/17 1855)  fentaNYL (SUBLIMAZE) injection 50 mcg (50 mcg Intravenous Given 07/05/17 2151)     Initial Impression / Assessment and Plan / ED Course  I have reviewed the triage vital signs and the nursing notes.  Pertinent labs & imaging results that were available during my care of the patient were reviewed by  me and considered in my medical decision making (see chart for details).     Laboratory work-up pertinent for minor AKI as well as elevated AST and T bili.  I have spoken to general surgery who have agreed to come and evaluate the patient further.  General surgery stated they will keep the patient overnight for observation and possible ERCP.  Final Clinical Impressions(s) / ED Diagnoses   Final diagnoses:  None    ED Discharge Orders    None       Chapman Moss, MD 07/05/17 0093    Orlie Dakin, MD 07/06/17 [phone]

## 2017-07-05 NOTE — H&P (Signed)
CC: RUQ pain; consult by Chapman Moss  HPI: Billy Kim is an 72 y.o. male with hx of HTN and UC presents for evaluation of recurrent episodes of RUQ pain. The pain is episodic but this episode seems constant. It is sharp and unremitting. He has associated n/v with the pain attacks. Pain doesn't radiate. Nothing makes it better or worse. Pain is 8-9/10. Denies f/c.   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 Hypertension   . Ulcerative colitis 5/81   Remission for years    Past Surgical History:  Procedure Laterality Date  . CARDIAC CATHETERIZATION  03/20/01   Cardiolite EF 55% 02/10/02  . COLONOSCOPY  multiple  . INGUINAL HERNIA REPAIR  04/09/06   Bilateral  . LAPAROSCOPIC APPENDECTOMY  03/1981   Bilateral laparoscopic inguinal hernia repair, Dr. Johney Maine 2008 Diagnostic laparoscopy, appendectomy many years ago  Family History  Problem Relation Age of Onset  . Cancer Mother        ? GYN, died after hysterectomy  . Stroke Father   . Cancer Brother 70       Bladder  . Prostate cancer Brother   . Prostate cancer Brother   . Colon cancer Neg Hx   . Esophageal cancer Neg Hx   . Rectal cancer Neg Hx   . Stomach cancer Neg Hx     Social:  reports that he has never smoked. He has never used smokeless tobacco. He reports that he does not drink alcohol or use drugs.  Allergies:  Allergies  Allergen Reactions  . Amoxicillin Other (See Comments)    Caused headache Has patient had a PCN reaction causing immediate rash, facial/tongue/throat swelling, SOB or lightheadedness with hypotension: No Has patient had a PCN reaction causing severe rash involving mucus membranes or skin necrosis: No Has patient had a PCN reaction that required hospitalization: No Has patient had a PCN reaction occurring within the last 10 years: No If all of the above answers are "NO", then may  proceed with Cephalosporin use.  Marland Kitchen Morphine Sulfate Other (See Comments)    headache  . Sulfonamide Derivatives Other (See Comments)    headache    Medications: I have reviewed the patient's current medications.  Results for orders placed or performed during the hospital encounter of 07/05/17 (from the past 48 hour(s))  Comprehensive metabolic panel     Status: Abnormal   Collection Time: 07/05/17  7:05 PM  Result Value Ref Range   Sodium 142 135 - 145 mmol/L   Potassium 4.2 3.5 - 5.1 mmol/L   Chloride 109 101 - 111 mmol/L   CO2 26 22 - 32 mmol/L   Glucose, Bld 122 (H) 65 - 99 mg/dL   BUN 18 6 - 20 mg/dL   Creatinine, Ser 1.29 (H) 0.61 - 1.24 mg/dL   Calcium 9.1 8.9 - 10.3 mg/dL   Total Protein 7.0 6.5 - 8.1 g/dL   Albumin 3.9 3.5 - 5.0 g/dL   AST 78 (H) 15 - 41 U/L   ALT 40 17 - 63 U/L   Alkaline Phosphatase 95 38 - 126 U/L   Total Bilirubin 2.7 (H) 0.3 - 1.2 mg/dL   GFR calc non Af Amer 53 (L) >60 mL/min   GFR calc Af Amer >60 >60 mL/min    Comment: (NOTE) The eGFR has been calculated using the CKD EPI equation. This calculation has not  been validated in all clinical situations. eGFR's persistently <60 mL/min signify possible Chronic Kidney Disease.    Anion gap 7 5 - 15    Comment: Performed at [facility] [address] [place], [place] [address]  Lipase, blood     Status: None   Collection Time: 07/05/17  7:05 PM  Result Value Ref Range   Lipase 46 11 - 51 U/L    Comment: Performed at [facility] [address], [place], [place] [address]  CBC with Differential     Status: Abnormal   Collection Time: 07/05/17  7:05 PM  Result Value Ref Range   WBC 10.0 4.0 - 10.5 K/uL   RBC 5.26 4.22 - 5.81 MIL/uL   Hemoglobin 16.0 13.0 - 17.0 g/dL   HCT 45.0 39.0 - 52.0 %   MCV 85.6 78.0 - 100.0 fL   MCH 30.4 26.0 - 34.0 pg   MCHC 35.6 30.0 - 36.0 g/dL   RDW 13.2 11.5 - 15.5 %   Platelets 138 (L) 150 - 400 K/uL   Neutrophils Relative % 78 %   Neutro Abs  7.9 (H) 1.7 - 7.7 K/uL   Lymphocytes Relative 10 %   Lymphs Abs 1.0 0.7 - 4.0 K/uL   Monocytes Relative 8 %   Monocytes Absolute 0.8 0.1 - 1.0 K/uL   Eosinophils Relative 3 %   Eosinophils Absolute 0.3 0.0 - 0.7 K/uL   Basophils Relative 1 %   Basophils Absolute 0.1 0.0 - 0.1 K/uL   Immature Granulocytes 0 %   Abs Immature Granulocytes 0.0 0.0 - 0.1 K/uL    Comment: Performed at [facility] [address], [place],  [address]    Dg Chest 2 View  Result Date: 07/05/2017 CLINICAL DATA:  Preoperative chest x-ray. EXAM: CHEST - 2 VIEW COMPARISON:  Chest x-ray dated May 20, 2015. FINDINGS: The heart size and mediastinal contours are within normal limits. Normal pulmonary vascularity. New nodular density in the left lower lobe. No focal consolidation, pleural effusion, or pneumothorax. No acute osseous abnormality. IMPRESSION: 1. New nodular density in the left lower lobe may represent focal atelectasis, however a lung nodule is not excluded. Recommend noncontrast chest CT for further evaluation. Electronically Signed   By: Titus Dubin M.D.   On: 07/05/2017 19:47    ROS - all of the below systems have been reviewed with the patient and positives are indicated with bold text General: chills, fever or night sweats Eyes: blurry vision or double vision ENT: epistaxis or sore throat Allergy/Immunology: itchy/watery eyes or nasal congestion Hematologic/Lymphatic: bleeding problems, blood clots or swollen lymph nodes Endocrine: temperature intolerance or unexpected weight changes Breast: new or changing breast lumps or nipple discharge Resp: cough, shortness of breath, or wheezing CV: chest pain or dyspnea on exertion GI: as per HPI GU: dysuria, trouble voiding, or hematuria MSK: joint pain or joint stiffness Neuro: TIA or stroke symptoms Derm: pruritus and skin lesion changes Psych: anxiety and depression  PE Blood pressure (!) 170/90, pulse 73, temperature (!) 97.5 F  (36.4 C), temperature source Oral, resp. rate 17, height '5\' 8"'  (1.727 m), weight 83.9 kg (185 lb), SpO2 99 %. Constitutional: NAD; conversant; no deformities Eyes: Moist conjunctiva; no lid lag; anicteric; PERRL Neck: Trachea midline; no thyromegaly Lungs: Normal respiratory effort; no tactile fremitus CV: RRR; no palpable thrills; no pitting edema GI: Abd soft, ttp in RUQ; no rebound/guarding; no palpable hepatosplenomegaly MSK: Normal gait; no clubbing/cyanosis Psychiatric:  Appropriate affect; alert and oriented x3 Lymphatic: No palpable cervical or axillary lymphadenopathy  Results for orders placed or performed during the hospital encounter of 07/05/17 (from the past 48 hour(s))  Comprehensive metabolic panel     Status: Abnormal   Collection Time: 07/05/17  7:05 PM  Result Value Ref Range   Sodium 142 135 - 145 mmol/L   Potassium 4.2 3.5 - 5.1 mmol/L   Chloride 109 101 - 111 mmol/L   CO2 26 22 - 32 mmol/L   Glucose, Bld 122 (H) 65 - 99 mg/dL   BUN 18 6 - 20 mg/dL   Creatinine, Ser 1.29 (H) 0.61 - 1.24 mg/dL   Calcium 9.1 8.9 - 10.3 mg/dL   Total Protein 7.0 6.5 - 8.1 g/dL   Albumin 3.9 3.5 - 5.0 g/dL   AST 78 (H) 15 - 41 U/L   ALT 40 17 - 63 U/L   Alkaline Phosphatase 95 38 - 126 U/L   Total Bilirubin 2.7 (H) 0.3 - 1.2 mg/dL   GFR calc non Af Amer 53 (L) >60 mL/min   GFR calc Af Amer >60 >60 mL/min    Comment: (NOTE) The eGFR has been calculated using the CKD EPI equation. This calculation has not been validated in all clinical situations. eGFR's persistently <60 mL/min signify possible Chronic Kidney Disease.    Anion gap 7 5 - 15    Comment: Performed at [facility] [address], [place], [place] [address]  Lipase, blood     Status: None   Collection Time: 07/05/17  7:05 PM  Result Value Ref Range   Lipase 46 11 - 51 U/L    Comment: Performed at [facility] [address], [place], [place] [address]  CBC with Differential     Status: Abnormal    Collection Time: 07/05/17  7:05 PM  Result Value Ref Range   WBC 10.0 4.0 - 10.5 K/uL   RBC 5.26 4.22 - 5.81 MIL/uL   Hemoglobin 16.0 13.0 - 17.0 g/dL   HCT 45.0 39.0 - 52.0 %   MCV 85.6 78.0 - 100.0 fL   MCH 30.4 26.0 - 34.0 pg   MCHC 35.6 30.0 - 36.0 g/dL   RDW 13.2 11.5 - 15.5 %   Platelets 138 (L) 150 - 400 K/uL   Neutrophils Relative % 78 %   Neutro Abs 7.9 (H) 1.7 - 7.7 K/uL   Lymphocytes Relative 10 %   Lymphs Abs 1.0 0.7 - 4.0 K/uL   Monocytes Relative 8 %   Monocytes Absolute 0.8 0.1 - 1.0 K/uL   Eosinophils Relative 3 %   Eosinophils Absolute 0.3 0.0 - 0.7 K/uL   Basophils Relative 1 %   Basophils Absolute 0.1 0.0 - 0.1 K/uL   Immature Granulocytes 0 %   Abs Immature Granulocytes 0.0 0.0 - 0.1 K/uL    Comment: Performed at [facility] [address], [place], [place] [address]    Dg Chest 2 View  Result Date: 07/05/2017 CLINICAL DATA:  Preoperative chest x-ray. EXAM: CHEST - 2 VIEW COMPARISON:  Chest x-ray dated May 20, 2015. FINDINGS: The heart size and mediastinal contours are within normal limits. Normal pulmonary vascularity. New nodular density in the left lower lobe. No focal consolidation, pleural effusion, or pneumothorax. No acute osseous abnormality. IMPRESSION: 1. New nodular density in the left lower lobe may represent focal atelectasis, however a lung nodule is not excluded. Recommend noncontrast chest CT for further evaluation.  Electronically Signed   By: Titus Dubin M.D.   On: 07/05/2017 19:47   A/P: Billy Kim is an 72 y.o. male with possible acute on chronic cholecystitis and possibly choledocholithiasis  -Admit to surgery -NPO after midnight -Repeat LFTs in AM -Empiric IV zosyn as GBW was mildly thickened and he has persistent RUQ pain -If LFTs are not down-trending, will plan GI consultation; if they are down-trending, will plan OR for laparoscopic vs open cholecystectomy, possible IOC - timing this admission -The anatomy and  physiology of the hepatobiliary system was discussed at length with the patient with associated pictures. The pathophysiology of gallbladder disease was discussed at length with associated pictures. -The options for treatment were discussed including ongoing observation which may result in subsequent gallbladder complications (infection, pancreatitis, recurrent choledocholithiasis, etc). -The planned procedure, material risks (including, but not limited to, pain, bleeding, infection, scarring, need for blood transfusion, damage to surrounding structures- blood vessels/nerves/viscus/organs, damage to bile duct, bile leak, need for additional procedures, hernia, pancreatitis, worsening of pre-existing medical conditions, pneumonia, heart attack, stroke, death) benefits and alternatives to surgery were discussed at length. I noted a good probability that the procedure would help improve his symptoms. The patient's questions were answered to his satisfaction, he voiced understanding and he elected to proceed with surgery. Additionally, we discussed typical postoperative expectations and the recovery process.  Sharon Mt. Dema Severin, M.D. [facility]

## 2017-07-06 ENCOUNTER — Inpatient Hospital Stay (HOSPITAL_COMMUNITY): Payer: Managed Care, Other (non HMO)

## 2017-07-06 LAB — COMPREHENSIVE METABOLIC PANEL
ALK PHOS: 131 U/L — AB (ref 38–126)
ALT: 370 U/L — AB (ref 17–63)
AST: 625 U/L — ABNORMAL HIGH (ref 15–41)
Albumin: 3.2 g/dL — ABNORMAL LOW (ref 3.5–5.0)
Anion gap: 8 (ref 5–15)
BILIRUBIN TOTAL: 4.3 mg/dL — AB (ref 0.3–1.2)
BUN: 15 mg/dL (ref 6–20)
CO2: 26 mmol/L (ref 22–32)
CREATININE: 1.12 mg/dL (ref 0.61–1.24)
Calcium: 8.2 mg/dL — ABNORMAL LOW (ref 8.9–10.3)
Chloride: 108 mmol/L (ref 101–111)
GFR calc Af Amer: >60 mL/min (ref >60)
GLUCOSE: 90 mg/dL (ref 65–99)
Potassium: 4 mmol/L (ref 3.5–5.1)
SODIUM: 142 mmol/L (ref 135–145)
TOTAL PROTEIN: 6.1 g/dL — AB (ref 6.5–8.1)

## 2017-07-06 LAB — CBC WITH DIFFERENTIAL/PLATELET
Abs Immature Granulocytes: 0 10*3/uL (ref 0.0–0.1)
BASOS ABS: 0 10*3/uL (ref 0.0–0.1)
BASOS PCT: 1 %
EOS PCT: 2 %
Eosinophils Absolute: 0.1 10*3/uL (ref 0.0–0.7)
HCT: 40.9 % (ref 39.0–52.0)
Hemoglobin: 14.2 g/dL (ref 13.0–17.0)
Immature Granulocytes: 0 %
Lymphocytes Relative: 17 %
Lymphs Abs: 0.8 10*3/uL (ref 0.7–4.0)
MCH: 29.8 pg (ref 26.0–34.0)
MCHC: 34.7 g/dL (ref 30.0–36.0)
MCV: 85.7 fL (ref 78.0–100.0)
MONO ABS: 0.7 10*3/uL (ref 0.1–1.0)
Monocytes Relative: 13 %
Neutro Abs: 3.3 10*3/uL (ref 1.7–7.7)
Neutrophils Relative %: 67 %
Platelets: 109 10*3/uL — ABNORMAL LOW (ref 150–400)
RBC: 4.77 MIL/uL (ref 4.22–5.81)
RDW: 13.2 % (ref 11.5–15.5)
WBC: 5 10*3/uL (ref 4.0–10.5)

## 2017-07-06 LAB — SURGICAL PCR SCREEN
MRSA, PCR: NEGATIVE
STAPHYLOCOCCUS AUREUS: NEGATIVE

## 2017-07-06 MED ORDER — DOCUSATE SODIUM 100 MG PO CAPS
200.0000 mg | ORAL_CAPSULE | Freq: Two times a day (BID) | ORAL | Status: DC
  Administered 2017-07-06 – 2017-07-07 (×4): 200 mg via ORAL
  Filled 2017-07-06 (×5): qty 2

## 2017-07-06 MED ORDER — DIPHENHYDRAMINE HCL 50 MG/ML IJ SOLN: 12.5000 mg | Freq: Four times a day (QID) | INTRAMUSCULAR | Status: DC | PRN

## 2017-07-06 MED ORDER — CARVEDILOL 3.125 MG PO TABS
3.1250 mg | ORAL_TABLET | Freq: Two times a day (BID) | ORAL | Status: DC
  Administered 2017-07-06 – 2017-07-08 (×5): 3.125 mg via ORAL
  Filled 2017-07-06 (×5): qty 1

## 2017-07-06 MED ORDER — ONDANSETRON HCL 4 MG/2ML IJ SOLN: 4.0000 mg | Freq: Four times a day (QID) | INTRAMUSCULAR | Status: DC | PRN

## 2017-07-06 MED ORDER — HYDROMORPHONE HCL 2 MG/ML IJ SOLN: 0.5000 mg | INTRAMUSCULAR | Status: DC | PRN

## 2017-07-06 MED ORDER — GADOBENATE DIMEGLUMINE 529 MG/ML IV SOLN
18.0000 mL | Freq: Once | INTRAVENOUS | Status: AC
  Administered 2017-07-06: 18 mL via INTRAVENOUS

## 2017-07-06 MED ORDER — LACTATED RINGERS IV SOLN
INTRAVENOUS | Status: DC
  Administered 2017-07-06 – 2017-07-07 (×3): via INTRAVENOUS

## 2017-07-06 MED ORDER — ACETAMINOPHEN 500 MG PO TABS
1000.0000 mg | ORAL_TABLET | Freq: Four times a day (QID) | ORAL | Status: DC
  Administered 2017-07-06 – 2017-07-07 (×3): 1000 mg via ORAL
  Filled 2017-07-06 (×3): qty 2

## 2017-07-06 MED ORDER — TRAMADOL HCL 50 MG PO TABS: 50.0000 mg | ORAL_TABLET | Freq: Four times a day (QID) | ORAL | Status: DC | PRN

## 2017-07-06 MED ORDER — HEPARIN SODIUM (PORCINE) 5000 UNIT/ML IJ SOLN
5000.0000 [IU] | Freq: Three times a day (TID) | INTRAMUSCULAR | Status: DC
  Administered 2017-07-06 – 2017-07-07 (×3): 5000 [IU] via SUBCUTANEOUS
  Filled 2017-07-06 (×3): qty 1

## 2017-07-06 MED ORDER — ONDANSETRON 4 MG PO TBDP: 4.0000 mg | ORAL_TABLET | Freq: Four times a day (QID) | ORAL | Status: DC | PRN

## 2017-07-06 MED ORDER — SIMETHICONE 80 MG PO CHEW: 40.0000 mg | CHEWABLE_TABLET | Freq: Four times a day (QID) | ORAL | Status: DC | PRN

## 2017-07-06 MED ORDER — HYDRALAZINE HCL 20 MG/ML IJ SOLN: 10.0000 mg | INTRAMUSCULAR | Status: DC | PRN

## 2017-07-06 MED ORDER — DIPHENHYDRAMINE HCL 12.5 MG/5ML PO ELIX: 12.5000 mg | ORAL_SOLUTION | Freq: Four times a day (QID) | ORAL | Status: DC | PRN

## 2017-07-06 NOTE — Progress Notes (Signed)
Received patient from ED accompanied by wife and son.  Patient AOx4, ambulatory, VS stable with slightly elevated BP at 154/89 d/t epigastric pain at 4/10 and O2Sat at 99% on RA.  Explained plan of care with patient and family members and they verbalized understanding.  Administered scheduled tylenol 1G per order and preop procedure done.  Clarified with Dr. Dema Severin patient's current diet, NPO vs Regular since reconciled release orders indicated "regular diet" but progress notes indicated NPO after midnight.  Dr. Dema Severin called back and confirmed NPO diet and diet order changed accordingly. Patient now resting comfortably with both eyes closed.  Will monitor.Marland Kitchen

## 2017-07-06 NOTE — Progress Notes (Signed)
[facility] Progress Note     Subjective: CC- epigastric pain Patient states that he is feeling much better than last night. He is having mild RUQ/epigastric discomfort but no true pain. Denies n/v. No appetite. Bilirubin up to 4.3 today from 2.7.  Objective: Vital signs in last 24 hours: Temp:  [97.5 F (36.4 C)-98 F (36.7 C)] 98 F (36.7 C) (05/18 0904) Pulse Rate:  [53-82] 53 (05/18 0904) Resp:  [11-19] 18 (05/18 0536) BP: (142-231)/(81-131) 147/89 (05/18 0904) SpO2:  [96 %-100 %] 100 % (05/18 0904) Weight:  [83.9 kg (185 lb)] 83.9 kg (185 lb) (05/18 0232) Last BM Date: 07/05/17  Intake/Output from previous day: 05/17 0701 - 05/18 0700 In: 675 [P.O.:120; I.V.:405; IV Piggyback:150] Out: -  Intake/Output this shift: No intake/output data recorded.  PE: Gen:  Alert, NAD, pleasant HEENT: EOM's intact, pupils equal and round Card:  RRR, no M/G/R heard Pulm:  CTAB, no W/R/R, effort normal Abd: well healed midline incision, soft, NT/ND, +BS, no HSM, no hernia Ext:  Calves soft and nontender Psych: A&Ox3  Skin: no rashes noted, warm and dry  Lab Results:  Recent Labs    07/05/17 1905 07/06/17 0642  WBC 10.0 5.0  HGB 16.0 14.2  HCT 45.0 40.9  PLT 138* 109*   BMET Recent Labs    07/05/17 1905 07/06/17 0642  NA 142 142  K 4.2 4.0  CL 109 108  CO2 26 26  GLUCOSE 122* 90  BUN 18 15  CREATININE 1.29* 1.12  CALCIUM 9.1 8.2*   PT/INR No results for input(s): LABPROT, INR in the last 72 hours. CMP     Component Value Date/Time   NA 142 07/06/2017 0642   NA 142 04/23/2017 0747   K 4.0 07/06/2017 0642   CL 108 07/06/2017 0642   CO2 26 07/06/2017 0642   GLUCOSE 90 07/06/2017 0642   BUN 15 07/06/2017 0642   BUN 13 04/23/2017 0747   CREATININE 1.12 07/06/2017 0642   CALCIUM 8.2 (L) 07/06/2017 0642   PROT 6.1 (L) 07/06/2017 0642   PROT 6.1 04/23/2017 0747   ALBUMIN 3.2 (L) 07/06/2017 0642   ALBUMIN 3.8 04/23/2017 0747   AST 625 (H)  07/06/2017 0642   ALT 370 (H) 07/06/2017 0642   ALKPHOS 131 (H) 07/06/2017 0642   BILITOT 4.3 (H) 07/06/2017 0642   BILITOT 1.8 (H) 04/23/2017 0747   GFRNONAA >60 07/06/2017 0642   GFRAA >60 07/06/2017 0642   Lipase     Component Value Date/Time   LIPASE 46 07/05/2017 1905       Studies/Results: Dg Chest 2 View  Result Date: 07/05/2017 CLINICAL DATA:  Preoperative chest x-ray. EXAM: CHEST - 2 VIEW COMPARISON:  Chest x-ray dated May 20, 2015. FINDINGS: The heart size and mediastinal contours are within normal limits. Normal pulmonary vascularity. New nodular density in the left lower lobe. No focal consolidation, pleural effusion, or pneumothorax. No acute osseous abnormality. IMPRESSION: 1. New nodular density in the left lower lobe may represent focal atelectasis, however a lung nodule is not excluded. Recommend noncontrast chest CT for further evaluation. Electronically Signed   By: Titus Dubin M.D.   On: 07/05/2017 19:47    Anti-infectives: Anti-infectives (From admission, onward)   Start     Dose/Rate Route Frequency Ordered Stop   07/06/17 0500  piperacillin-tazobactam (ZOSYN) IVPB 3.375 g     3.375 g 12.5 mL/hr over 240 Minutes Intravenous Every 8 hours 07/05/17 2217     07/05/17 2230  piperacillin-tazobactam (ZOSYN) IVPB 3.375 g     3.375 g 100 mL/hr over 30 Minutes Intravenous  Once 07/05/17 2216 07/06/17 0102       Assessment/Plan HTN - home med UC  Acute on chronic cholecystitis ?Choledocholithiasis - recurrent RUQ/epigastric pain - u/s 5/15 showed gallbladder distention with mild wall thickening and gallbladder sludge and gallstones - elevated LFTs on admission, WBC WNL  ID - zosyn 5/17>> FEN - IVF, NPO VTE - SCDs, heparin Foley - none Follow up - TBD  Plan - LFTs trending up. Will order MRCP, plan to consult GI if positive for choledocholithiasis. Ok to have clear liquids after MRCP, NPO after MN.   LOS: 1 day    Wellington Hampshire ,  [facility] 07/06/2017, 10:59 AM Pager: [phone] Consults: [phone] Mon-Fri 7:00 am-4:30 pm Sat-Sun 7:00 am-11:30 am

## 2017-07-07 DIAGNOSIS — K8050 Calculus of bile duct without cholangitis or cholecystitis without obstruction: Secondary | ICD-10-CM

## 2017-07-07 LAB — CBC
HEMATOCRIT: 41.7 % (ref 39.0–52.0)
Hemoglobin: 14.5 g/dL (ref 13.0–17.0)
MCH: 29.8 pg (ref 26.0–34.0)
MCHC: 34.8 g/dL (ref 30.0–36.0)
MCV: 85.6 fL (ref 78.0–100.0)
Platelets: 118 10*3/uL — ABNORMAL LOW (ref 150–400)
RBC: 4.87 MIL/uL (ref 4.22–5.81)
RDW: 13.4 % (ref 11.5–15.5)
WBC: 5.2 10*3/uL (ref 4.0–10.5)

## 2017-07-07 LAB — COMPREHENSIVE METABOLIC PANEL
ALT: 281 U/L — ABNORMAL HIGH (ref 17–63)
AST: 199 U/L — ABNORMAL HIGH (ref 15–41)
Albumin: 3.3 g/dL — ABNORMAL LOW (ref 3.5–5.0)
Alkaline Phosphatase: 137 U/L — ABNORMAL HIGH (ref 38–126)
Anion gap: 9 (ref 5–15)
BUN: 11 mg/dL (ref 6–20)
CO2: 25 mmol/L (ref 22–32)
Calcium: 8.4 mg/dL — ABNORMAL LOW (ref 8.9–10.3)
Chloride: 106 mmol/L (ref 101–111)
Creatinine, Ser: 1.28 mg/dL — ABNORMAL HIGH (ref 0.61–1.24)
GFR calc Af Amer: >60 mL/min (ref >60)
GFR calc non Af Amer: 54 mL/min — ABNORMAL LOW (ref >60)
Glucose, Bld: 84 mg/dL (ref 65–99)
Potassium: 3.7 mmol/L (ref 3.5–5.1)
Sodium: 140 mmol/L (ref 135–145)
Total Bilirubin: 3.7 mg/dL — ABNORMAL HIGH (ref 0.3–1.2)
Total Protein: 6.1 g/dL — ABNORMAL LOW (ref 6.5–8.1)

## 2017-07-07 MED ORDER — SODIUM CHLORIDE 0.45 % IV SOLN
INTRAVENOUS | Status: DC
  Administered 2017-07-07 – 2017-07-09 (×5): via INTRAVENOUS
  Filled 2017-07-07 (×11): qty 1000

## 2017-07-07 NOTE — Consult Note (Signed)
[facility] Referring Provider: Dr. Brantley Stage Primary Care Physician:  Tonia Ghent, MD Primary Gastroenterologist:  Dr. Carlean Purl  Reason for Consultation: Bile duct stone on MRI   HPI:  Billy Kim is a 72 y.o. male who has had intermittent epigastric pains for about 3 months.  These would be a severe pain, lasting 3-4 hours, radiating a bit to the right, associated with mild nausea. Eventually he sought medical attention for the pains, had out patient [place] last week and was scheduled to meet a surgery next week to consider elective cholecystectomy for symptomatic gallstones, chronic cholecystitis. Unfortunately the pains worsened and he presented to the ER this weekend.    He has been afebrile and WBC was normal at admission. LFTs a bit elevated and he underwent MRI last night that showed CBD stone.     Lab review (Epic many years): Chronic elevation of TBili dating back at least 10 years ago (usually around 2.0, no direct bili vs indirect bili fractionation).  ?underlying Gilbert's  [place] 06/2017: GB distention with mild wall thickening, sludge and gallstones, neg murphy's.  MRI with MRCP 06/2017: CBD 39mm, + CBD stone measuring 13mm, GB thickened with minimal pericholecystic fluid  Colonoscopy Dr. Carlean Purl 05/2013 for routine risk screening: diverticulosis, hemorrhoids; no polyps or cancers.    Past Medical History:  Diagnosis Date  . Hypertension   . Ulcerative colitis 5/81   Remission for years    Past Surgical History:  Procedure Laterality Date  . CARDIAC CATHETERIZATION  03/20/01   Cardiolite EF 55% 02/10/02  . COLONOSCOPY  multiple  . INGUINAL HERNIA REPAIR  04/09/06   Bilateral  . LAPAROSCOPIC APPENDECTOMY  03/1981    Prior to Admission medications   Medication Sig Start Date End Date Taking? Authorizing Provider  Ascorbic Acid (VITAMIN C) 1000 MG tablet Take 1,000 mg by mouth daily.     Yes [provider]  aspirin-acetaminophen-caffeine (EXCEDRIN  EXTRA STRENGTH) [phone] MG per tablet Take 1 tablet by mouth daily as needed (pain). As needed    Yes [provider]  carvedilol (COREG) 3.125 MG tablet Take 1 tablet (3.125 mg total) by mouth 2 (two) times daily. 04/25/17  Yes Tonia Ghent, MD  Coenzyme Q10 (COQ10) 100 MG capsule Take 100 mg by mouth daily.    Yes [provider]  Garlic 154 MG TABS Take 500 mg by mouth daily.   Yes [provider]  ibuprofen (ADVIL) 200 MG tablet Take 2 tablets (400 mg total) by mouth every 8 (eight) hours as needed (with food). Patient taking differently: Take 400 mg by mouth daily as needed (pain (take with food)).  09/27/15  Yes Tonia Ghent, MD  Omega-3 Fatty Acids ([employer] FISH OIL) 1000 MG CAPS Take 1 capsule by mouth daily. Patient taking differently: Take 1,000 mg by mouth daily.  04/25/17  Yes Tonia Ghent, MD    Current Facility-Administered Medications  Medication Dose Route Frequency Provider Last Rate Last Dose  . acetaminophen (TYLENOL) tablet 1,000 mg  1,000 mg Oral Q6H Ileana Roup, MD   1,000 mg at 07/06/17 0232  . carvedilol (COREG) tablet 3.125 mg  3.125 mg Oral BID WC Ileana Roup, MD   3.125 mg at 07/07/17 0835  . diphenhydrAMINE (BENADRYL) 12.5 MG/5ML elixir 12.5 mg  12.5 mg Oral Q6H PRN Ileana Roup, MD       Or  . diphenhydrAMINE (BENADRYL) injection 12.5 mg  12.5 mg Intravenous Q6H PRN Ileana Roup,  MD      . docusate sodium (COLACE) capsule 200 mg  200 mg Oral BID Ileana Roup, MD   200 mg at 07/07/17 0835  . heparin injection 5,000 Units  5,000 Units Subcutaneous Q8H Ileana Roup, MD   5,000 Units at 07/07/17 0556  . hydrALAZINE (APRESOLINE) injection 10 mg  10 mg Intravenous Q2H PRN Ileana Roup, MD      . HYDROmorphone (DILAUDID) injection 0.5 mg  0.5 mg Intravenous Q3H PRN Ileana Roup, MD      . ondansetron (ZOFRAN-ODT) disintegrating tablet 4 mg  4 mg Oral Q6H PRN Ileana Roup, MD       Or  . ondansetron [facility]) injection 4 mg  4 mg Intravenous Q6H PRN Ileana Roup, MD      . piperacillin-tazobactam (ZOSYN) IVPB 3.375 g  3.375 g Intravenous Q8H Rumbarger, Valeda Malm, RPH   Stopped at 07/07/17 0956  . simethicone (MYLICON) chewable tablet 40 mg  40 mg Oral Q6H PRN Ileana Roup, MD      . sodium chloride 0.45 % 1,000 mL with potassium chloride 10 mEq infusion   Intravenous Continuous Meuth, Brooke A, PA-C      . traMADol (ULTRAM) tablet 50 mg  50 mg Oral Q6H PRN Ileana Roup, MD        Allergies as of 07/05/2017 - Review Complete 07/05/2017  Allergen Reaction Noted  . Amoxicillin Other (See Comments)   . Morphine sulfate Other (See Comments)   . Sulfonamide derivatives Other (See Comments)     Family History  Problem Relation Age of Onset  . Cancer Mother        ? GYN, died after hysterectomy  . Stroke Father   . Cancer Brother 70       Bladder  . Prostate cancer Brother   . Prostate cancer Brother   . Colon cancer Neg Hx   . Esophageal cancer Neg Hx   . Rectal cancer Neg Hx   . Stomach cancer Neg Hx     Social History   Socioeconomic History  . Marital status: Married    Spouse name: Not on file  . Number of children: 1  . Years of education: Not on file  . Highest education level: Not on file  Occupational History  . Occupation: Information systems manager: SELF    Comment: Information systems manager business  Social Needs  . Financial resource strain: Not on file  . Food insecurity:    Worry: Not on file    Inability: Not on file  . Transportation needs:    Medical: Not on file    Non-medical: Not on file  Tobacco Use  . Smoking status: Never Smoker  . Smokeless tobacco: Never Used  Substance and Sexual Activity  . Alcohol use: No  . Drug use: No  . Sexual activity: Never  Lifestyle  . Physical activity:    Days per week: Not on file    Minutes per session: Not on file  . Stress: Not on file  Relationships   . Social connections:    Talks on phone: Not on file    Gets together: Not on file    Attends religious service: Not on file    Active member of club or organization: Not on file    Attends meetings of clubs or organizations: Not on file    Relationship status: Not on file  . Intimate partner violence:  Fear of current or ex partner: Not on file    Emotionally abused: Not on file    Physically abused: Not on file    Forced sexual activity: Not on file  Other Topics Concern  . 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    Review of Systems: Pertinent positive and negative review of systems were noted in the above HPI section. Complete review of systems was performed and was otherwise normal.   Physical Exam: Vital signs in last 24 hours: Temp:  [97.7 F (36.5 C)-98.2 F (36.8 C)] 98.2 F (36.8 C) (05/19 0557) Pulse Rate:  [50-62] 62 (05/19 0557) Resp:  [14] 14 (05/18 1259) BP: (153-166)/(84-104) 153/84 (05/19 0557) SpO2:  [97 %-100 %] 97 % (05/19 0557) Last BM Date: 07/06/17 Constitutional: generally well-appearing Psychiatric: alert and oriented x3 Eyes: extraocular movements intact Mouth: oral pharynx moist, no lesions Neck: supple no lymphadenopathy Cardiovascular: heart regular rate and rhythm Lungs: clear to auscultation bilaterally Abdomen: soft, nontender, nondistended, no obvious ascites, no peritoneal signs, normal bowel sounds Extremities: no lower extremity edema bilaterally Skin: no lesions on visible extremities   Lab Results: Recent Labs    07/05/17 1905 07/06/17 0642 07/07/17 0521  WBC 10.0 5.0 5.2  HGB 16.0 14.2 14.5  HCT 45.0 40.9 41.7  PLT 138* 109* 118*  MCV 85.6 85.7 85.6   BMET Recent Labs    07/05/17 1905 07/06/17 0642 07/07/17 0521  NA 142 142 140  K 4.2 4.0 3.7  CL 109 108 106  CO2 26 26 25   GLUCOSE 122* 90 84  BUN 18 15 11   CREATININE  1.29* 1.12 1.28*  CALCIUM 9.1 8.2* 8.4*   LFT Recent Labs    07/07/17 0521  BILITOT 3.7*  AST 199*  ALT 281*  ALKPHOS 137*  PROT 6.1*  ALBUMIN 3.3*     Impression/Plan: 72 y.o. male with gallstone disease  Cholelithiasis, chronic cholecystitis and choledocholithiasis confirmed by MRI last night.  We discussed risks, benefits to ERCP (including pancreatitis, perforation, bleeding) and he and his wife understand, agree to proceed.  ERCP tomorrow with Dr.Gupta, NPO after MN tonight, hold [place] heparin for now (lower risk for DVT given that he ambulates and has SCDs in place)   Milus Banister, MD  07/07/2017, 10:40 AM [facility] Pager [phone]

## 2017-07-07 NOTE — Progress Notes (Signed)
[facility] Progress Note     Subjective: CC- hungry No complaints this morning. Abdominal pain resolved. Denies n/v. Ambulated yesterday without any issues. States that he is starting to get his appetite back. MRCP last night showed choledocholithiasis.  Objective: Vital signs in last 24 hours: Temp:  [97.7 F (36.5 C)-98.2 F (36.8 C)] 98.2 F (36.8 C) (05/19 0557) Pulse Rate:  [50-62] 62 (05/19 0557) Resp:  [14] 14 (05/18 1259) BP: (153-166)/(84-104) 153/84 (05/19 0557) SpO2:  [97 %-100 %] 97 % (05/19 0557) Last BM Date: 07/05/17  Intake/Output from previous day: No intake/output data recorded. Intake/Output this shift: No intake/output data recorded.  PE: Gen:  Alert, NAD, pleasant HEENT: EOM's intact, pupils equal and round Card:  RRR, no M/G/R heard Pulm:  CTAB, no W/R/R, effort normal Abd: well healed midline incision, soft, NT/ND, +BS, no HSM, no hernia Ext:  Calves soft and nontender Psych: A&Ox3  Skin: no rashes noted, warm and dry  Lab Results:  Recent Labs    07/06/17 0642 07/07/17 0521  WBC 5.0 5.2  HGB 14.2 14.5  HCT 40.9 41.7  PLT 109* 118*   BMET Recent Labs    07/06/17 0642 07/07/17 0521  NA 142 140  K 4.0 3.7  CL 108 106  CO2 26 25  GLUCOSE 90 84  BUN 15 11  CREATININE 1.12 1.28*  CALCIUM 8.2* 8.4*   PT/INR No results for input(s): LABPROT, INR in the last 72 hours. CMP     Component Value Date/Time   NA 140 07/07/2017 0521   NA 142 04/23/2017 0747   K 3.7 07/07/2017 0521   CL 106 07/07/2017 0521   CO2 25 07/07/2017 0521   GLUCOSE 84 07/07/2017 0521   BUN 11 07/07/2017 0521   BUN 13 04/23/2017 0747   CREATININE 1.28 (H) 07/07/2017 0521   CALCIUM 8.4 (L) 07/07/2017 0521   PROT 6.1 (L) 07/07/2017 0521   PROT 6.1 04/23/2017 0747   ALBUMIN 3.3 (L) 07/07/2017 0521   ALBUMIN 3.8 04/23/2017 0747   AST 199 (H) 07/07/2017 0521   ALT 281 (H) 07/07/2017 0521   ALKPHOS 137 (H) 07/07/2017 0521   BILITOT 3.7 (H)  07/07/2017 0521   BILITOT 1.8 (H) 04/23/2017 0747   GFRNONAA 54 (L) 07/07/2017 0521   GFRAA >60 07/07/2017 0521   Lipase     Component Value Date/Time   LIPASE 46 07/05/2017 1905       Studies/Results: Dg Chest 2 View  Result Date: 07/05/2017 CLINICAL DATA:  Preoperative chest x-ray. EXAM: CHEST - 2 VIEW COMPARISON:  Chest x-ray dated May 20, 2015. FINDINGS: The heart size and mediastinal contours are within normal limits. Normal pulmonary vascularity. New nodular density in the left lower lobe. No focal consolidation, pleural effusion, or pneumothorax. No acute osseous abnormality. IMPRESSION: 1. New nodular density in the left lower lobe may represent focal atelectasis, however a lung nodule is not excluded. Recommend noncontrast chest CT for further evaluation. Electronically Signed   By: Titus Dubin M.D.   On: 07/05/2017 19:47   Mr 3d Recon At Scanner  Result Date: 07/07/2017 CLINICAL DATA:  Abnormal liver function tests.  Cholelithiasis. EXAM: MRI ABDOMEN WITHOUT AND WITH CONTRAST (INCLUDING MRCP) TECHNIQUE: Multiplanar multisequence MR imaging of the abdomen was performed both before and after the administration of intravenous contrast. Heavily T2-weighted images of the biliary and pancreatic ducts were obtained, and three-dimensional MRCP images were rendered by post processing. CONTRAST:  70mL MULTIHANCE GADOBENATE DIMEGLUMINE 529 MG/ML IV  SOLN COMPARISON:  Ultrasound 07/03/2017 FINDINGS: Lower chest:  Lung bases are clear. Hepatobiliary: No intrahepatic biliary duct dilatation. Common bile duct is normal caliber. There is however, a filling defect within distal common bile duct measuring 5 mm (image 27/8). This filling defect/stone is also seen on axial image 28/7 The gallbladder wall is thickened. Minimal pericholecystic fluid. There is sludge within the the thickened gallbladder. At least 1 stone is noted. The gallbladder is not distended measuring 3.7 cm which is similar to  ultrasound. Within the RIGHT hepatic lobe there is a oblong lesion which is mixed signal intensity on T2 weighted imaging and low signal intensity on T1 weighted imaging measuring 4.7 by 2.6 cm (image 37/1300). This lesion demonstrates avid postcontrast enhancement and communicates with the RIGHT hepatic vein (series 1302) and consistent with a portal venous shunt. A small hemangioma in the more superior RIGHT hepatic lobe measures 9 mm (image 16/01/04/1998 Pancreas: Normal pancreatic parenchymal intensity. No ductal dilatation or inflammation. Spleen: Several small enhancing lesions in the spleen likely represent hemangiomas. Adrenals/urinary tract: Adrenal glands and kidneys are normal. Stomach/Bowel: Stomach and limited of the small bowel is unremarkable Vascular/Lymphatic: Abdominal aortic normal caliber. No retroperitoneal periportal lymphadenopathy. Musculoskeletal: No aggressive osseous lesion IMPRESSION: 1. Distal common bile duct stone (choledocholithiasis). Minimal duct dilatation. 2. Gallbladder wall thickening, sludge, and gallstone. Findings are consistent with acute or chronic cholecystitis. 3. Portal venous shunt in the RIGHT hepatic lobe with large venous varix. 4. Small liver and splenic hemangiomas. Electronically Signed   By: Suzy Bouchard M.D.   On: 07/07/2017 07:22   Mr Abdomen Mrcp Moise Boring Contast  Result Date: 07/07/2017 CLINICAL DATA:  Abnormal liver function tests.  Cholelithiasis. EXAM: MRI ABDOMEN WITHOUT AND WITH CONTRAST (INCLUDING MRCP) TECHNIQUE: Multiplanar multisequence MR imaging of the abdomen was performed both before and after the administration of intravenous contrast. Heavily T2-weighted images of the biliary and pancreatic ducts were obtained, and three-dimensional MRCP images were rendered by post processing. CONTRAST:  29mL MULTIHANCE GADOBENATE DIMEGLUMINE 529 MG/ML IV SOLN COMPARISON:  Ultrasound 07/03/2017 FINDINGS: Lower chest:  Lung bases are clear. Hepatobiliary:  No intrahepatic biliary duct dilatation. Common bile duct is normal caliber. There is however, a filling defect within distal common bile duct measuring 5 mm (image 27/8). This filling defect/stone is also seen on axial image 28/7 The gallbladder wall is thickened. Minimal pericholecystic fluid. There is sludge within the the thickened gallbladder. At least 1 stone is noted. The gallbladder is not distended measuring 3.7 cm which is similar to ultrasound. Within the RIGHT hepatic lobe there is a oblong lesion which is mixed signal intensity on T2 weighted imaging and low signal intensity on T1 weighted imaging measuring 4.7 by 2.6 cm (image 37/1300). This lesion demonstrates avid postcontrast enhancement and communicates with the RIGHT hepatic vein (series 1302) and consistent with a portal venous shunt. A small hemangioma in the more superior RIGHT hepatic lobe measures 9 mm (image 16/01/04/1998 Pancreas: Normal pancreatic parenchymal intensity. No ductal dilatation or inflammation. Spleen: Several small enhancing lesions in the spleen likely represent hemangiomas. Adrenals/urinary tract: Adrenal glands and kidneys are normal. Stomach/Bowel: Stomach and limited of the small bowel is unremarkable Vascular/Lymphatic: Abdominal aortic normal caliber. No retroperitoneal periportal lymphadenopathy. Musculoskeletal: No aggressive osseous lesion IMPRESSION: 1. Distal common bile duct stone (choledocholithiasis). Minimal duct dilatation. 2. Gallbladder wall thickening, sludge, and gallstone. Findings are consistent with acute or chronic cholecystitis. 3. Portal venous shunt in the RIGHT hepatic lobe with large venous varix. 4. Small  liver and splenic hemangiomas. Electronically Signed   By: Suzy Bouchard M.D.   On: 07/07/2017 07:22    Anti-infectives: Anti-infectives (From admission, onward)   Start     Dose/Rate Route Frequency Ordered Stop   07/06/17 0500  piperacillin-tazobactam (ZOSYN) IVPB 3.375 g     3.375  g 12.5 mL/hr over 240 Minutes Intravenous Every 8 hours 07/05/17 2217     07/05/17 2230  piperacillin-tazobactam (ZOSYN) IVPB 3.375 g     3.375 g 100 mL/hr over 30 Minutes Intravenous  Once 07/05/17 2216 07/06/17 0102       Assessment/Plan HTN - home med UC AKI - Cr up to 1.28, increase IVF and recheck in AM  Acute on chronic cholecystitis Choledocholithiasis - recurrent RUQ/epigastric pain - u/s 5/15 showed gallbladder distention with mild wall thickening and gallbladder sludge and gallstones - elevated LFTs on admission, WBC WNL - MRCP positive for choledocholithiasis  ID - zosyn 5/17>> FEN - IVF, NPO VTE - SCDs, heparin Foley - none Follow up - TBD  Plan - MRCP+ for choledocholithiasis, GI consult called. Keep NPO for now; if he is not having a procedure today for clear liquids and NPO after midnight.   LOS: 2 days    Wellington Hampshire , [facility] 07/07/2017, 9:21 AM Pager: [phone] Consults: [phone] Mon-Fri 7:00 am-4:30 pm Sat-Sun 7:00 am-11:30 am

## 2017-07-07 NOTE — H&P (View-Only) (Signed)
[facility] Referring Provider: Dr. Brantley Stage Primary Care Physician:  Tonia Ghent, MD Primary Gastroenterologist:  Dr. Carlean Purl  Reason for Consultation: Bile duct stone on MRI   HPI:  Billy Kim is a 72 y.o. male who has had intermittent epigastric pains for about 3 months.  These would be a severe pain, lasting 3-4 hours, radiating a bit to the right, associated with mild nausea. Eventually he sought medical attention for the pains, had out patient [place] last week and was scheduled to meet a surgery next week to consider elective cholecystectomy for symptomatic gallstones, chronic cholecystitis. Unfortunately the pains worsened and he presented to the ER this weekend.    He has been afebrile and WBC was normal at admission. LFTs a bit elevated and he underwent MRI last night that showed CBD stone.     Lab review (Epic many years): Chronic elevation of TBili dating back at least 10 years ago (usually around 2.0, no direct bili vs indirect bili fractionation).  ?underlying Gilbert's  [place] 06/2017: GB distention with mild wall thickening, sludge and gallstones, neg murphy's.  MRI with MRCP 06/2017: CBD 44mm, + CBD stone measuring 93mm, GB thickened with minimal pericholecystic fluid  Colonoscopy Dr. Carlean Purl 05/2013 for routine risk screening: diverticulosis, hemorrhoids; no polyps or cancers.    Past Medical History:  Diagnosis Date  . Hypertension   . Ulcerative colitis 5/81   Remission for years    Past Surgical History:  Procedure Laterality Date  . CARDIAC CATHETERIZATION  03/20/01   Cardiolite EF 55% 02/10/02  . COLONOSCOPY  multiple  . INGUINAL HERNIA REPAIR  04/09/06   Bilateral  . LAPAROSCOPIC APPENDECTOMY  03/1981    Prior to Admission medications   Medication Sig Start Date End Date Taking? Authorizing Provider  Ascorbic Acid (VITAMIN C) 1000 MG tablet Take 1,000 mg by mouth daily.     Yes [provider]  aspirin-acetaminophen-caffeine (EXCEDRIN  EXTRA STRENGTH) 6206902571 MG per tablet Take 1 tablet by mouth daily as needed (pain). As needed    Yes [provider]  carvedilol (COREG) 3.125 MG tablet Take 1 tablet (3.125 mg total) by mouth 2 (two) times daily. 04/25/17  Yes Tonia Ghent, MD  Coenzyme Q10 (COQ10) 100 MG capsule Take 100 mg by mouth daily.    Yes [provider]  Garlic 725 MG TABS Take 500 mg by mouth daily.   Yes [provider]  ibuprofen (ADVIL) 200 MG tablet Take 2 tablets (400 mg total) by mouth every 8 (eight) hours as needed (with food). Patient taking differently: Take 400 mg by mouth daily as needed (pain (take with food)).  09/27/15  Yes Tonia Ghent, MD  Omega-3 Fatty Acids ([employer] FISH OIL) 1000 MG CAPS Take 1 capsule by mouth daily. Patient taking differently: Take 1,000 mg by mouth daily.  04/25/17  Yes Tonia Ghent, MD    Current Facility-Administered Medications  Medication Dose Route Frequency Provider Last Rate Last Dose  . acetaminophen (TYLENOL) tablet 1,000 mg  1,000 mg Oral Q6H Ileana Roup, MD   1,000 mg at 07/06/17 0232  . carvedilol (COREG) tablet 3.125 mg  3.125 mg Oral BID WC Ileana Roup, MD   3.125 mg at 07/07/17 0835  . diphenhydrAMINE (BENADRYL) 12.5 MG/5ML elixir 12.5 mg  12.5 mg Oral Q6H PRN Ileana Roup, MD       Or  . diphenhydrAMINE (BENADRYL) injection 12.5 mg  12.5 mg Intravenous Q6H PRN Ileana Roup,  MD      . docusate sodium (COLACE) capsule 200 mg  200 mg Oral BID Ileana Roup, MD   200 mg at 07/07/17 0835  . heparin injection 5,000 Units  5,000 Units Subcutaneous Q8H Ileana Roup, MD   5,000 Units at 07/07/17 0556  . hydrALAZINE (APRESOLINE) injection 10 mg  10 mg Intravenous Q2H PRN Ileana Roup, MD      . HYDROmorphone (DILAUDID) injection 0.5 mg  0.5 mg Intravenous Q3H PRN Ileana Roup, MD      . ondansetron (ZOFRAN-ODT) disintegrating tablet 4 mg  4 mg Oral Q6H PRN Ileana Roup, MD       Or  . ondansetron [facility]) injection 4 mg  4 mg Intravenous Q6H PRN Ileana Roup, MD      . piperacillin-tazobactam (ZOSYN) IVPB 3.375 g  3.375 g Intravenous Q8H Rumbarger, Valeda Malm, RPH   Stopped at 07/07/17 0956  . simethicone (MYLICON) chewable tablet 40 mg  40 mg Oral Q6H PRN Ileana Roup, MD      . sodium chloride 0.45 % 1,000 mL with potassium chloride 10 mEq infusion   Intravenous Continuous Meuth, Brooke A, PA-C      . traMADol (ULTRAM) tablet 50 mg  50 mg Oral Q6H PRN Ileana Roup, MD        Allergies as of 07/05/2017 - Review Complete 07/05/2017  Allergen Reaction Noted  . Amoxicillin Other (See Comments)   . Morphine sulfate Other (See Comments)   . Sulfonamide derivatives Other (See Comments)     Family History  Problem Relation Age of Onset  . Cancer Mother        ? GYN, died after hysterectomy  . Stroke Father   . Cancer Brother 70       Bladder  . Prostate cancer Brother   . Prostate cancer Brother   . Colon cancer Neg Hx   . Esophageal cancer Neg Hx   . Rectal cancer Neg Hx   . Stomach cancer Neg Hx     Social History   Socioeconomic History  . Marital status: Married    Spouse name: Not on file  . Number of children: 1  . Years of education: Not on file  . Highest education level: Not on file  Occupational History  . Occupation: Information systems manager: SELF    Comment: Information systems manager business  Social Needs  . Financial resource strain: Not on file  . Food insecurity:    Worry: Not on file    Inability: Not on file  . Transportation needs:    Medical: Not on file    Non-medical: Not on file  Tobacco Use  . Smoking status: Never Smoker  . Smokeless tobacco: Never Used  Substance and Sexual Activity  . Alcohol use: No  . Drug use: No  . Sexual activity: Never  Lifestyle  . Physical activity:    Days per week: Not on file    Minutes per session: Not on file  . Stress: Not on file  Relationships   . Social connections:    Talks on phone: Not on file    Gets together: Not on file    Attends religious service: Not on file    Active member of club or organization: Not on file    Attends meetings of clubs or organizations: Not on file    Relationship status: Not on file  . Intimate partner violence:  Fear of current or ex partner: Not on file    Emotionally abused: Not on file    Physically abused: Not on file    Forced sexual activity: Not on file  Other Topics Concern  . 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    Review of Systems: Pertinent positive and negative review of systems were noted in the above HPI section. Complete review of systems was performed and was otherwise normal.   Physical Exam: Vital signs in last 24 hours: Temp:  [97.7 F (36.5 C)-98.2 F (36.8 C)] 98.2 F (36.8 C) (05/19 0557) Pulse Rate:  [50-62] 62 (05/19 0557) Resp:  [14] 14 (05/18 1259) BP: (153-166)/(84-104) 153/84 (05/19 0557) SpO2:  [97 %-100 %] 97 % (05/19 0557) Last BM Date: 07/06/17 Constitutional: generally well-appearing Psychiatric: alert and oriented x3 Eyes: extraocular movements intact Mouth: oral pharynx moist, no lesions Neck: supple no lymphadenopathy Cardiovascular: heart regular rate and rhythm Lungs: clear to auscultation bilaterally Abdomen: soft, nontender, nondistended, no obvious ascites, no peritoneal signs, normal bowel sounds Extremities: no lower extremity edema bilaterally Skin: no lesions on visible extremities   Lab Results: Recent Labs    07/05/17 1905 07/06/17 0642 07/07/17 0521  WBC 10.0 5.0 5.2  HGB 16.0 14.2 14.5  HCT 45.0 40.9 41.7  PLT 138* 109* 118*  MCV 85.6 85.7 85.6   BMET Recent Labs    07/05/17 1905 07/06/17 0642 07/07/17 0521  NA 142 142 140  K 4.2 4.0 3.7  CL 109 108 106  CO2 26 26 25   GLUCOSE 122* 90 84  BUN 18 15 11   CREATININE  1.29* 1.12 1.28*  CALCIUM 9.1 8.2* 8.4*   LFT Recent Labs    07/07/17 0521  BILITOT 3.7*  AST 199*  ALT 281*  ALKPHOS 137*  PROT 6.1*  ALBUMIN 3.3*     Impression/Plan: 72 y.o. male with gallstone disease  Cholelithiasis, chronic cholecystitis and choledocholithiasis confirmed by MRI last night.  We discussed risks, benefits to ERCP (including pancreatitis, perforation, bleeding) and he and his wife understand, agree to proceed.  ERCP tomorrow with Dr.Gupta, NPO after MN tonight, hold [place] heparin for now (lower risk for DVT given that he ambulates and has SCDs in place)   Milus Banister, MD  07/07/2017, 10:40 AM [facility] Pager [phone]

## 2017-07-08 ENCOUNTER — Inpatient Hospital Stay (HOSPITAL_COMMUNITY): Payer: Managed Care, Other (non HMO) | Admitting: Anesthesiology

## 2017-07-08 ENCOUNTER — Inpatient Hospital Stay (HOSPITAL_COMMUNITY): Payer: Managed Care, Other (non HMO)

## 2017-07-08 ENCOUNTER — Encounter (HOSPITAL_COMMUNITY): Admission: EM | Disposition: A | Discharge status: Home / Self Care | Payer: Self-pay | Source: Home / Self Care

## 2017-07-08 ENCOUNTER — Encounter (HOSPITAL_COMMUNITY): Payer: Self-pay

## 2017-07-08 DIAGNOSIS — K8020 Calculus of gallbladder without cholecystitis without obstruction: Secondary | ICD-10-CM

## 2017-07-08 DIAGNOSIS — R945 Abnormal results of liver function studies: Secondary | ICD-10-CM

## 2017-07-08 DIAGNOSIS — R7989 Other specified abnormal findings of blood chemistry: Secondary | ICD-10-CM

## 2017-07-08 HISTORY — PX: REMOVAL OF STONES: SHX5545

## 2017-07-08 HISTORY — PX: ENDOSCOPIC RETROGRADE CHOLANGIOPANCREATOGRAPHY (ERCP) WITH PROPOFOL: SHX5810

## 2017-07-08 HISTORY — PX: SPHINCTEROTOMY: SHX5544

## 2017-07-08 LAB — COMPREHENSIVE METABOLIC PANEL
ALT: 179 U/L — ABNORMAL HIGH (ref 17–63)
ANION GAP: 8 (ref 5–15)
AST: 85 U/L — ABNORMAL HIGH (ref 15–41)
Albumin: 3 g/dL — ABNORMAL LOW (ref 3.5–5.0)
Alkaline Phosphatase: 120 U/L (ref 38–126)
BUN: 10 mg/dL (ref 6–20)
CO2: 24 mmol/L (ref 22–32)
Calcium: 8.1 mg/dL — ABNORMAL LOW (ref 8.9–10.3)
Chloride: 108 mmol/L (ref 101–111)
Creatinine, Ser: 1.29 mg/dL — ABNORMAL HIGH (ref 0.61–1.24)
GFR calc Af Amer: >60 mL/min (ref >60)
GFR calc non Af Amer: 53 mL/min — ABNORMAL LOW (ref >60)
Glucose, Bld: 92 mg/dL (ref 65–99)
POTASSIUM: 3.8 mmol/L (ref 3.5–5.1)
Sodium: 140 mmol/L (ref 135–145)
TOTAL PROTEIN: 5.8 g/dL — AB (ref 6.5–8.1)
Total Bilirubin: 2.8 mg/dL — ABNORMAL HIGH (ref 0.3–1.2)

## 2017-07-08 LAB — CBC
HEMATOCRIT: 40.7 % (ref 39.0–52.0)
HEMOGLOBIN: 14.4 g/dL (ref 13.0–17.0)
MCH: 30.7 pg (ref 26.0–34.0)
MCHC: 35.4 g/dL (ref 30.0–36.0)
MCV: 86.8 fL (ref 78.0–100.0)
Platelets: 115 10*3/uL — ABNORMAL LOW (ref 150–400)
RBC: 4.69 MIL/uL (ref 4.22–5.81)
RDW: 13.5 % (ref 11.5–15.5)
WBC: 5 10*3/uL (ref 4.0–10.5)

## 2017-07-08 SURGERY — ENDOSCOPIC RETROGRADE CHOLANGIOPANCREATOGRAPHY (ERCP) WITH PROPOFOL
Anesthesia: General

## 2017-07-08 MED ORDER — LIDOCAINE 2% (20 MG/ML) 5 ML SYRINGE
INTRAMUSCULAR | Status: DC | PRN
  Administered 2017-07-08: 100 mg via INTRAVENOUS

## 2017-07-08 MED ORDER — ROCURONIUM BROMIDE 10 MG/ML (PF) SYRINGE
PREFILLED_SYRINGE | INTRAVENOUS | Status: DC | PRN
  Administered 2017-07-08: 50 mg via INTRAVENOUS

## 2017-07-08 MED ORDER — FENTANYL CITRATE (PF) 250 MCG/5ML IJ SOLN
INTRAMUSCULAR | Status: DC | PRN
  Administered 2017-07-08: 100 ug via INTRAVENOUS

## 2017-07-08 MED ORDER — LACTATED RINGERS IV SOLN
INTRAVENOUS | Status: DC | PRN
  Administered 2017-07-08: 11:00:00 via INTRAVENOUS

## 2017-07-08 MED ORDER — GLUCAGON HCL RDNA (DIAGNOSTIC) 1 MG IJ SOLR
INTRAMUSCULAR | Status: DC | PRN
  Administered 2017-07-08 (×2): .2 mg via INTRAVENOUS

## 2017-07-08 MED ORDER — ONDANSETRON HCL 4 MG/2ML IJ SOLN
INTRAMUSCULAR | Status: DC | PRN
  Administered 2017-07-08: 4 mg via INTRAVENOUS

## 2017-07-08 MED ORDER — SODIUM CHLORIDE 0.9 % IV SOLN
INTRAVENOUS | Status: DC | PRN
  Administered 2017-07-08: 10 mL

## 2017-07-08 MED ORDER — IOPAMIDOL (ISOVUE-300) INJECTION 61%
INTRAVENOUS | Status: AC
  Filled 2017-07-08: qty 50

## 2017-07-08 MED ORDER — FENTANYL CITRATE (PF) 100 MCG/2ML IJ SOLN
INTRAMUSCULAR | Status: AC
  Filled 2017-07-08: qty 2

## 2017-07-08 MED ORDER — GLUCAGON HCL RDNA (DIAGNOSTIC) 1 MG IJ SOLR
INTRAMUSCULAR | Status: AC
  Filled 2017-07-08: qty 1

## 2017-07-08 MED ORDER — SUGAMMADEX SODIUM 200 MG/2ML IV SOLN
INTRAVENOUS | Status: DC | PRN
  Administered 2017-07-08: 335.6 mg via INTRAVENOUS

## 2017-07-08 MED ORDER — INDOMETHACIN 50 MG RE SUPP
RECTAL | Status: AC
  Filled 2017-07-08: qty 2

## 2017-07-08 MED ORDER — GLYCOPYRROLATE 0.2 MG/ML IV SOSY
PREFILLED_SYRINGE | INTRAVENOUS | Status: DC | PRN
  Administered 2017-07-08: .2 mg via INTRAVENOUS

## 2017-07-08 MED ORDER — MENTHOL 3 MG MT LOZG: 1.0000 | LOZENGE | OROMUCOSAL | Status: DC | PRN

## 2017-07-08 MED ORDER — INDOMETHACIN 50 MG RE SUPP
RECTAL | Status: DC | PRN
  Administered 2017-07-08: 100 mg via RECTAL

## 2017-07-08 MED ORDER — PHENOL 1.4 % MT LIQD
1.0000 | OROMUCOSAL | Status: DC | PRN
  Filled 2017-07-08: qty 177

## 2017-07-08 MED ORDER — DEXAMETHASONE SODIUM PHOSPHATE 10 MG/ML IJ SOLN
INTRAMUSCULAR | Status: DC | PRN
  Administered 2017-07-08: 4 mg via INTRAVENOUS

## 2017-07-08 MED ORDER — PROPOFOL 10 MG/ML IV BOLUS
INTRAVENOUS | Status: DC | PRN
  Administered 2017-07-08: 100 mg via INTRAVENOUS

## 2017-07-08 MED ORDER — PHENYLEPHRINE 40 MCG/ML (10ML) SYRINGE FOR IV PUSH (FOR BLOOD PRESSURE SUPPORT)
PREFILLED_SYRINGE | INTRAVENOUS | Status: DC | PRN
  Administered 2017-07-08: 40 ug via INTRAVENOUS
  Administered 2017-07-08 (×2): 80 ug via INTRAVENOUS

## 2017-07-08 NOTE — Progress Notes (Signed)
[facility] Progress Note  Day of Surgery  Subjective: CC: no complaints Patient states abdominal pain is no longer present. Denies nausea or vomiting. No flatus, but having bowel movements. ERCP planned for today. Discussed laparoscopic cholecystectomy planned for tomorrow provided labs are ok.  Mildly HTN  Objective: Vital signs in last 24 hours: Temp:  [97.5 F (36.4 C)-97.8 F (36.6 C)] 97.5 F (36.4 C) (05/20 0432) Pulse Rate:  [56-59] 57 (05/20 0432) Resp:  [18] 18 (05/20 0432) BP: (142-165)/(80-91) 152/80 (05/20 0432) SpO2:  [95 %-98 %] 97 % (05/20 0432) Last BM Date: 07/06/17  Intake/Output from previous day: 05/19 0701 - 05/20 0700 In: 1500 [I.V.:1250; IV Piggyback:250] Out: -  Intake/Output this shift: No intake/output data recorded.  PE: Gen:  Alert, NAD, pleasant Card:  Regular rate and rhythm, pedal pulses 2+ BL Pulm:  Normal effort, clear to auscultation bilaterally Abd: Soft, non-tender, non-distended, bowel sounds present, no HSM Skin: warm and dry, no rashes  Psych: A&Ox3   Lab Results:  Recent Labs    07/07/17 0521 07/08/17 0429  WBC 5.2 5.0  HGB 14.5 14.4  HCT 41.7 40.7  PLT 118* 115*   BMET Recent Labs    07/07/17 0521 07/08/17 0429  NA 140 140  K 3.7 3.8  CL 106 108  CO2 25 24  GLUCOSE 84 92  BUN 11 10  CREATININE 1.28* 1.29*  CALCIUM 8.4* 8.1*   PT/INR No results for input(s): LABPROT, INR in the last 72 hours. CMP     Component Value Date/Time   NA 140 07/08/2017 0429   NA 142 04/23/2017 0747   K 3.8 07/08/2017 0429   CL 108 07/08/2017 0429   CO2 24 07/08/2017 0429   GLUCOSE 92 07/08/2017 0429   BUN 10 07/08/2017 0429   BUN 13 04/23/2017 0747   CREATININE 1.29 (H) 07/08/2017 0429   CALCIUM 8.1 (L) 07/08/2017 0429   PROT 5.8 (L) 07/08/2017 0429   PROT 6.1 04/23/2017 0747   ALBUMIN 3.0 (L) 07/08/2017 0429   ALBUMIN 3.8 04/23/2017 0747   AST 85 (H) 07/08/2017 0429   ALT 179 (H) 07/08/2017 0429   ALKPHOS  120 07/08/2017 0429   BILITOT 2.8 (H) 07/08/2017 0429   BILITOT 1.8 (H) 04/23/2017 0747   GFRNONAA 53 (L) 07/08/2017 0429   GFRAA >60 07/08/2017 0429   Lipase     Component Value Date/Time   LIPASE 46 07/05/2017 1905       Studies/Results: Mr 3d Recon At Scanner  Result Date: 07/07/2017 CLINICAL DATA:  Abnormal liver function tests.  Cholelithiasis. EXAM: MRI ABDOMEN WITHOUT AND WITH CONTRAST (INCLUDING MRCP) TECHNIQUE: Multiplanar multisequence MR imaging of the abdomen was performed both before and after the administration of intravenous contrast. Heavily T2-weighted images of the biliary and pancreatic ducts were obtained, and three-dimensional MRCP images were rendered by post processing. CONTRAST:  77mL MULTIHANCE GADOBENATE DIMEGLUMINE 529 MG/ML IV SOLN COMPARISON:  Ultrasound 07/03/2017 FINDINGS: Lower chest:  Lung bases are clear. Hepatobiliary: No intrahepatic biliary duct dilatation. Common bile duct is normal caliber. There is however, a filling defect within distal common bile duct measuring 5 mm (image 27/8). This filling defect/stone is also seen on axial image 28/7 The gallbladder wall is thickened. Minimal pericholecystic fluid. There is sludge within the the thickened gallbladder. At least 1 stone is noted. The gallbladder is not distended measuring 3.7 cm which is similar to ultrasound. Within the RIGHT hepatic lobe there is a oblong lesion which is mixed  signal intensity on T2 weighted imaging and low signal intensity on T1 weighted imaging measuring 4.7 by 2.6 cm (image 37/1300). This lesion demonstrates avid postcontrast enhancement and communicates with the RIGHT hepatic vein (series 1302) and consistent with a portal venous shunt. A small hemangioma in the more superior RIGHT hepatic lobe measures 9 mm (image 16/01/04/1998 Pancreas: Normal pancreatic parenchymal intensity. No ductal dilatation or inflammation. Spleen: Several small enhancing lesions in the spleen likely  represent hemangiomas. Adrenals/urinary tract: Adrenal glands and kidneys are normal. Stomach/Bowel: Stomach and limited of the small bowel is unremarkable Vascular/Lymphatic: Abdominal aortic normal caliber. No retroperitoneal periportal lymphadenopathy. Musculoskeletal: No aggressive osseous lesion IMPRESSION: 1. Distal common bile duct stone (choledocholithiasis). Minimal duct dilatation. 2. Gallbladder wall thickening, sludge, and gallstone. Findings are consistent with acute or chronic cholecystitis. 3. Portal venous shunt in the RIGHT hepatic lobe with large venous varix. 4. Small liver and splenic hemangiomas. Electronically Signed   By: Suzy Bouchard M.D.   On: 07/07/2017 07:22   Mr Abdomen Mrcp Moise Boring Contast  Result Date: 07/07/2017 CLINICAL DATA:  Abnormal liver function tests.  Cholelithiasis. EXAM: MRI ABDOMEN WITHOUT AND WITH CONTRAST (INCLUDING MRCP) TECHNIQUE: Multiplanar multisequence MR imaging of the abdomen was performed both before and after the administration of intravenous contrast. Heavily T2-weighted images of the biliary and pancreatic ducts were obtained, and three-dimensional MRCP images were rendered by post processing. CONTRAST:  31mL MULTIHANCE GADOBENATE DIMEGLUMINE 529 MG/ML IV SOLN COMPARISON:  Ultrasound 07/03/2017 FINDINGS: Lower chest:  Lung bases are clear. Hepatobiliary: No intrahepatic biliary duct dilatation. Common bile duct is normal caliber. There is however, a filling defect within distal common bile duct measuring 5 mm (image 27/8). This filling defect/stone is also seen on axial image 28/7 The gallbladder wall is thickened. Minimal pericholecystic fluid. There is sludge within the the thickened gallbladder. At least 1 stone is noted. The gallbladder is not distended measuring 3.7 cm which is similar to ultrasound. Within the RIGHT hepatic lobe there is a oblong lesion which is mixed signal intensity on T2 weighted imaging and low signal intensity on T1 weighted  imaging measuring 4.7 by 2.6 cm (image 37/1300). This lesion demonstrates avid postcontrast enhancement and communicates with the RIGHT hepatic vein (series 1302) and consistent with a portal venous shunt. A small hemangioma in the more superior RIGHT hepatic lobe measures 9 mm (image 16/01/04/1998 Pancreas: Normal pancreatic parenchymal intensity. No ductal dilatation or inflammation. Spleen: Several small enhancing lesions in the spleen likely represent hemangiomas. Adrenals/urinary tract: Adrenal glands and kidneys are normal. Stomach/Bowel: Stomach and limited of the small bowel is unremarkable Vascular/Lymphatic: Abdominal aortic normal caliber. No retroperitoneal periportal lymphadenopathy. Musculoskeletal: No aggressive osseous lesion IMPRESSION: 1. Distal common bile duct stone (choledocholithiasis). Minimal duct dilatation. 2. Gallbladder wall thickening, sludge, and gallstone. Findings are consistent with acute or chronic cholecystitis. 3. Portal venous shunt in the RIGHT hepatic lobe with large venous varix. 4. Small liver and splenic hemangiomas. Electronically Signed   By: Suzy Bouchard M.D.   On: 07/07/2017 07:22    Anti-infectives: Anti-infectives (From admission, onward)   Start     Dose/Rate Route Frequency Ordered Stop   07/06/17 0500  piperacillin-tazobactam (ZOSYN) IVPB 3.375 g     3.375 g 12.5 mL/hr over 240 Minutes Intravenous Every 8 hours 07/05/17 2217     07/05/17 2230  piperacillin-tazobactam (ZOSYN) IVPB 3.375 g     3.375 g 100 mL/hr over 30 Minutes Intravenous  Once 07/05/17 2216 07/06/17 0102  Assessment/Plan HTN - home med UC AKI - Cr 1.29, continue IVF  Acute on chronic cholecystitis Choledocholithiasis -recurrent RUQ/epigastric pain - u/s 5/15 showed gallbladder distention with mild wall thickening and gallbladder sludge and gallstones - elevated LFTs on admission, WBC WNL - MRCP positive for choledocholithiasis - GI consulted and planning ERCP  today   ID -zosyn 5/17>> FEN -IVF, NPO VTE -SCDs Foley -none Follow up -TBD  Plan- MRCP+ for choledocholithiasis, GI planning ERCP today. Will plan for lap chole tomorrow.    LOS: 3 days    Brigid Re , [facility] 07/08/2017, 9:40 AM Pager: [phone] Consults: [phone] Mon-Fri 7:00 am-4:30 pm Sat-Sun 7:00 am-11:30 am

## 2017-07-08 NOTE — Anesthesia Procedure Notes (Signed)
Procedure Name: Intubation Date/Time: 07/08/2017 10:54 AM Performed by: Renato Shin, CRNA Pre-anesthesia Checklist: Patient identified, Emergency Drugs available, Suction available and Patient being monitored Patient Re-evaluated:Patient Re-evaluated prior to induction Oxygen Delivery Method: Circle system utilized Preoxygenation: Pre-oxygenation with 100% oxygen Induction Type: IV induction Ventilation: Mask ventilation without difficulty Laryngoscope Size: Miller and 3 Grade View: Grade I Tube type: Oral Tube size: 7.5 mm Number of attempts: 1 Airway Equipment and Method: Stylet Placement Confirmation: ETT inserted through vocal cords under direct vision,  positive ETCO2,  CO2 detector and breath sounds checked- equal and bilateral Secured at: 21 cm Tube secured with: Tape Dental Injury: Teeth and Oropharynx as per pre-operative assessment

## 2017-07-08 NOTE — Interval H&P Note (Signed)
History and Physical Interval Note:  07/08/2017 10:37 AM  Billy Kim  has presented today for surgery, with the diagnosis of choledocholithiasis  The various methods of treatment have been discussed with the patient and family. After consideration of risks, benefits and other options for treatment, the patient has consented to  Procedure(s): ENDOSCOPIC RETROGRADE CHOLANGIOPANCREATOGRAPHY (ERCP) WITH PROPOFOL (N/A) as a surgical intervention .  The patient's history has been reviewed, patient examined, no change in status, stable for surgery.  I have reviewed the patient's chart and labs.  Questions were answered to the patient's satisfaction.     Jackquline Denmark

## 2017-07-08 NOTE — Anesthesia Postprocedure Evaluation (Signed)
Anesthesia Post Note  Patient: Billy Kim  Procedure(s) Performed: ENDOSCOPIC RETROGRADE CHOLANGIOPANCREATOGRAPHY (ERCP) WITH PROPOFOL (N/A ) SPHINCTEROTOMY REMOVAL OF STONES     Patient location during evaluation: Endoscopy Anesthesia Type: General Level of consciousness: sedated and awake Pain management: pain level controlled Vital Signs Assessment: post-procedure vital signs reviewed and stable Respiratory status: spontaneous breathing, nonlabored ventilation, respiratory function stable and patient connected to nasal cannula oxygen Cardiovascular status: blood pressure returned to baseline and stable Postop Assessment: no apparent nausea or vomiting Anesthetic complications: no    Last Vitals:  Vitals:   07/08/17 1210 07/08/17 1349  BP: (!) 176/93 (!) 190/93  Pulse: 68 (!) 54  Resp: 10 20  Temp:  (!) 36.3 C  SpO2: 96% 100%    Last Pain:  Vitals:   07/08/17 1349  TempSrc: Oral  PainSc:                  Nancie Bocanegra,JAMES TERRILL

## 2017-07-08 NOTE — Anesthesia Preprocedure Evaluation (Signed)
Anesthesia Evaluation  Patient identified by MRN, date of birth, ID band Patient awake    Reviewed: Allergy & Precautions, NPO status , Patient's Chart, lab work & pertinent test results  Airway Mallampati: I  TM Distance: >3 FB Neck ROM: Full    Dental   Pulmonary    Pulmonary exam normal        Cardiovascular hypertension, Pt. on medications Normal cardiovascular exam     Neuro/Psych    GI/Hepatic GERD  Medicated and Controlled,  Endo/Other    Renal/GU      Musculoskeletal   Abdominal   Peds  Hematology   Anesthesia Other Findings   Reproductive/Obstetrics                             Anesthesia Physical Anesthesia Plan  ASA: III  Anesthesia Plan: General   Post-op Pain Management:    Induction:   PONV Risk Score and Plan: 2 and Ondansetron and Treatment may vary due to age or medical condition  Airway Management Planned: Oral ETT  Additional Equipment:   Intra-op Plan:   Post-operative Plan: Extubation in OR  Informed Consent: I have reviewed the patients History and Physical, chart, labs and discussed the procedure including the risks, benefits and alternatives for the proposed anesthesia with the patient or authorized representative who has indicated his/her understanding and acceptance.     Plan Discussed with: CRNA and Surgeon  Anesthesia Plan Comments:         Anesthesia Quick Evaluation

## 2017-07-08 NOTE — Transfer of Care (Signed)
Immediate Anesthesia Transfer of Care Note  Patient: Billy Kim  Procedure(s) Performed: ENDOSCOPIC RETROGRADE CHOLANGIOPANCREATOGRAPHY (ERCP) WITH PROPOFOL (N/A ) SPHINCTEROTOMY  Patient Location: Endoscopy Unit  Anesthesia Type:General  Level of Consciousness: awake, alert , oriented and patient cooperative  Airway & Oxygen Therapy: Patient Spontanous Breathing and Patient connected to nasal cannula oxygen  Post-op Assessment: Report given to RN and Post -op Vital signs reviewed and stable  Post vital signs: Reviewed and stable  Last Vitals:  Vitals Value Taken Time  BP 182/85 07/08/2017 11:48 AM  Temp    Pulse 67 07/08/2017 11:48 AM  Resp 15 07/08/2017 11:48 AM  SpO2 100 % 07/08/2017 11:48 AM  Vitals shown include unvalidated device data.  Last Pain:  Vitals:   07/08/17 1023  TempSrc: Oral  PainSc:          Complications: No apparent anesthesia complications

## 2017-07-08 NOTE — Op Note (Addendum)
[facility] Patient Name: Billy Kim Procedure Date : 07/08/2017 MRN: [id number] Attending MD: Jackquline Denmark , MD Date of Birth: [birth year] CSN: [id number] Age: 72 Admit Type: Inpatient Procedure:                ERCP Indications:              Common bile duct stone(s) on MRCP. With abnormal                            liver function tests Providers:                Jackquline Denmark MD, MD, Angus Seller, Waynette Buttery., Technician, Luane School CRNA Referring MD:              Medicines:                General Anesthesia, patient already on Zosyn,                            Indomethacin 462 mg PR Complications:            No immediate complications. Estimated Blood Loss:     Estimated blood loss: none. Procedure:                Pre-Anesthesia Assessment:                           - Prior to the procedure, a History and Physical                            was performed, and patient medications and                            allergies were reviewed. The patient is competent.                            The risks and benefits of the procedure and the                            sedation options and risks were discussed with the                            patient. The risks including the risks of                            pancreatitis requiring prolonged hospitalization,                            bleeding after sphincterotomy requiring blood                            transfusion, rare risks of perforation requiring                            laparotomy were  discussed. The benefits were also                            discussed. He wishes to proceed. All questions were                            answered and informed consent was obtained. Patient                            identification and proposed procedure were verified                            in the pre-procedure area in the procedure room.                            Mental Status  Examination: alert and oriented. ASA                            Grade Assessment: III - A patient with severe                            systemic disease. After reviewing the risks and                            benefits, the patient was deemed in satisfactory                            condition to undergo the procedure. The anesthesia                            plan was to use general anesthesia. Immediately                            prior to administration of medications, the patient                            was re-assessed for adequacy to receive sedatives.                            The heart rate, respiratory rate, oxygen                            saturations, blood pressure, adequacy of pulmonary                            ventilation, and response to care were monitored                            throughout the procedure. The physical status of                            the patient was re-assessed after the procedure.  After obtaining informed consent, the scope was                            passed under direct vision. Throughout the                            procedure, the patient's blood pressure, pulse, and                            oxygen saturations were monitored continuously. The                            [id number] scope was introduced through the                            mouth, and used to inject contrast into and used to                            inject contrast into the bile duct. The ERCP was                            accomplished without difficulty. The patient                            tolerated the procedure well. Scope In: Scope Out: Findings:      The scout film was normal. The esophagus was successfully intubated       under direct vision. The scope was advanced to a normal major papilla in       the descending duodenum without detailed examination of the pharynx,       larynx and associated structures, and upper GI  tract. The upper GI tract       was grossly normal. The bile duct was deeply cannulated with the       short-nosed traction sphincterotome. Contrast was injected. I personally       interpreted the bile duct images. Image quality was excellent.       Choledocholithiasis was found in a mildly dilated duct measuring 8 mm.       The maximum diameter of the ducts was 10 mm. A 8 mm biliary       sphincterotomy was made with a monofilament traction (standard)       sphincterotome using ERBE electrocautery. There was no       post-sphincterotomy bleeding. The biliary tree was swept with a 12 mm       balloon starting at the bifurcation. One stone and sludge was removed.       No stones remained. Thereafter, post occlusion cholangiogram was       performed. residual stones. The cystic duct was patent. There were       multiple filling defects in the gallbladder suggestive of cholelithiasis. Impression:               - Choledocholithiasis s/p biliary sphincterotomy                            and balloon extraction followed by postop occlusion  cholangiogram.                           - Cholelithiasis                           - Pancreaticogram was intentionally not performed -                            to decrease the risk of pancreatitis. Recommendation:           - Transfer patient to [facility].                           - Clear liquid diet at 3 PM.                           - Can proceed with laparoscopic cholecystectomy for                            tomorrow.                           - Watch for pancreatitis, bleeding, perforation,                            and cholangitis. Procedure Code(s):        --- Professional ---                           [phone], Endoscopic retrograde                            cholangiopancreatography (ERCP); with removal of                            calculi/debris from biliary/pancreatic duct(s)                           43262, Endoscopic  retrograde                            cholangiopancreatography (ERCP); with                            sphincterotomy/papillotomy                           74328, 26, Endoscopic catheterization of the                            biliary ductal system, radiological supervision and                            interpretation Diagnosis Code(s):        --- Professional ---                           K80.50, Calculus of bile duct without cholangitis  or cholecystitis without obstruction                           R93.2, Abnormal findings on diagnostic imaging of                            liver and biliary tract CPT copyright 2017 [employer]. All rights reserved. The codes documented in this report are preliminary and upon coder review may  be revised to meet current compliance requirements. Jackquline Denmark, MD 07/08/2017 11:53:29 AM This report has been signed electronically. Number of Addenda: 0

## 2017-07-09 ENCOUNTER — Inpatient Hospital Stay (HOSPITAL_COMMUNITY): Payer: Managed Care, Other (non HMO) | Admitting: Certified Registered"

## 2017-07-09 ENCOUNTER — Encounter (HOSPITAL_COMMUNITY): Admission: EM | Disposition: A | Discharge status: Home / Self Care | Payer: Self-pay | Source: Home / Self Care

## 2017-07-09 HISTORY — PX: CHOLECYSTECTOMY: SHX55

## 2017-07-09 LAB — CBC
HCT: 46.2 % (ref 39.0–52.0)
Hemoglobin: 16.1 g/dL (ref 13.0–17.0)
MCH: 29.7 pg (ref 26.0–34.0)
MCHC: 34.8 g/dL (ref 30.0–36.0)
MCV: 85.2 fL (ref 78.0–100.0)
PLATELETS: 135 10*3/uL — AB (ref 150–400)
RBC: 5.42 MIL/uL (ref 4.22–5.81)
RDW: 13.5 % (ref 11.5–15.5)
WBC: 9.7 10*3/uL (ref 4.0–10.5)

## 2017-07-09 LAB — CREATININE, SERUM
Creatinine, Ser: 1.25 mg/dL — ABNORMAL HIGH (ref 0.61–1.24)
GFR calc non Af Amer: 55 mL/min — ABNORMAL LOW (ref >60)

## 2017-07-09 SURGERY — LAPAROSCOPIC CHOLECYSTECTOMY
Anesthesia: General | Site: Abdomen

## 2017-07-09 MED ORDER — DEXAMETHASONE SODIUM PHOSPHATE 10 MG/ML IJ SOLN
INTRAMUSCULAR | Status: DC | PRN
  Administered 2017-07-09: 10 mg via INTRAVENOUS

## 2017-07-09 MED ORDER — HEMOSTATIC AGENTS (NO CHARGE) OPTIME
TOPICAL | Status: DC | PRN
  Administered 2017-07-09 (×2): 1 via TOPICAL

## 2017-07-09 MED ORDER — PROPOFOL 10 MG/ML IV BOLUS
INTRAVENOUS | Status: DC | PRN
  Administered 2017-07-09: 130 mg via INTRAVENOUS
  Administered 2017-07-09: 30 mg via INTRAVENOUS

## 2017-07-09 MED ORDER — ONDANSETRON HCL 4 MG/2ML IJ SOLN
INTRAMUSCULAR | Status: DC | PRN
  Administered 2017-07-09: 4 mg via INTRAVENOUS

## 2017-07-09 MED ORDER — ROCURONIUM BROMIDE 100 MG/10ML IV SOLN
INTRAVENOUS | Status: DC | PRN
  Administered 2017-07-09: 10 mg via INTRAVENOUS
  Administered 2017-07-09: 40 mg via INTRAVENOUS

## 2017-07-09 MED ORDER — HYDRALAZINE HCL 20 MG/ML IJ SOLN: 10.0000 mg | Freq: Four times a day (QID) | INTRAMUSCULAR | Status: DC | PRN

## 2017-07-09 MED ORDER — OXYCODONE HCL 5 MG PO TABS
5.0000 mg | ORAL_TABLET | ORAL | Status: DC | PRN
  Administered 2017-07-09: 10 mg via ORAL
  Administered 2017-07-09: 5 mg via ORAL
  Administered 2017-07-10 (×2): 10 mg via ORAL
  Filled 2017-07-09: qty 2
  Filled 2017-07-09: qty 1
  Filled 2017-07-09 (×2): qty 2

## 2017-07-09 MED ORDER — ONDANSETRON 4 MG PO TBDP: 4.0000 mg | ORAL_TABLET | Freq: Four times a day (QID) | ORAL | Status: DC | PRN

## 2017-07-09 MED ORDER — LACTATED RINGERS IV SOLN
INTRAVENOUS | Status: DC
  Administered 2017-07-09 (×2): via INTRAVENOUS

## 2017-07-09 MED ORDER — HYDROMORPHONE HCL 2 MG/ML IJ SOLN: 1.0000 mg | INTRAMUSCULAR | Status: DC | PRN

## 2017-07-09 MED ORDER — STERILE WATER FOR IRRIGATION IR SOLN
Status: DC | PRN
  Administered 2017-07-09: 1000 mL

## 2017-07-09 MED ORDER — CARVEDILOL 3.125 MG PO TABS
3.1250 mg | ORAL_TABLET | Freq: Two times a day (BID) | ORAL | Status: DC
  Administered 2017-07-09 – 2017-07-10 (×2): 3.125 mg via ORAL
  Filled 2017-07-09 (×2): qty 1

## 2017-07-09 MED ORDER — LIDOCAINE HCL (CARDIAC) PF 100 MG/5ML IV SOSY
PREFILLED_SYRINGE | INTRAVENOUS | Status: DC | PRN
  Administered 2017-07-09: 60 mg via INTRAVENOUS

## 2017-07-09 MED ORDER — TRAMADOL HCL 50 MG PO TABS: 50.0000 mg | ORAL_TABLET | Freq: Four times a day (QID) | ORAL | Status: DC | PRN

## 2017-07-09 MED ORDER — OXYCODONE HCL 5 MG/5ML PO SOLN: 5.0000 mg | Freq: Once | ORAL | Status: DC | PRN

## 2017-07-09 MED ORDER — PROPOFOL 10 MG/ML IV BOLUS
INTRAVENOUS | Status: AC
  Filled 2017-07-09: qty 20

## 2017-07-09 MED ORDER — LIDOCAINE 2% (20 MG/ML) 5 ML SYRINGE
INTRAMUSCULAR | Status: AC
  Filled 2017-07-09: qty 5

## 2017-07-09 MED ORDER — FENTANYL CITRATE (PF) 250 MCG/5ML IJ SOLN
INTRAMUSCULAR | Status: AC
  Filled 2017-07-09: qty 5

## 2017-07-09 MED ORDER — BUPIVACAINE-EPINEPHRINE 0.25% -1:200000 IJ SOLN
INTRAMUSCULAR | Status: DC | PRN
  Administered 2017-07-09: 20 mL

## 2017-07-09 MED ORDER — ONDANSETRON HCL 4 MG/2ML IJ SOLN
4.0000 mg | Freq: Four times a day (QID) | INTRAMUSCULAR | Status: DC | PRN
Start: 2017-07-09 — End: 2017-07-10

## 2017-07-09 MED ORDER — HYDROMORPHONE HCL 1 MG/ML IJ SOLN: 1.0000 mg | INTRAMUSCULAR | Status: DC | PRN

## 2017-07-09 MED ORDER — FENTANYL CITRATE (PF) 100 MCG/2ML IJ SOLN
INTRAMUSCULAR | Status: DC | PRN
  Administered 2017-07-09: 50 ug via INTRAVENOUS
  Administered 2017-07-09: 100 ug via INTRAVENOUS
  Administered 2017-07-09 (×7): 50 ug via INTRAVENOUS

## 2017-07-09 MED ORDER — HYDROMORPHONE HCL 2 MG/ML IJ SOLN: 0.2500 mg | INTRAMUSCULAR | Status: DC | PRN

## 2017-07-09 MED ORDER — OXYCODONE HCL 5 MG PO TABS: 5.0000 mg | ORAL_TABLET | Freq: Once | ORAL | Status: DC | PRN

## 2017-07-09 MED ORDER — HEPARIN SODIUM (PORCINE) 5000 UNIT/ML IJ SOLN: 5000.0000 [IU] | Freq: Three times a day (TID) | INTRAMUSCULAR | Status: DC

## 2017-07-09 MED ORDER — HYDRALAZINE HCL 20 MG/ML IJ SOLN
INTRAMUSCULAR | Status: AC
  Administered 2017-07-09: 10 mg
  Filled 2017-07-09: qty 1

## 2017-07-09 MED ORDER — ROCURONIUM BROMIDE 10 MG/ML (PF) SYRINGE
PREFILLED_SYRINGE | INTRAVENOUS | Status: AC
  Filled 2017-07-09: qty 5

## 2017-07-09 MED ORDER — ACETAMINOPHEN 500 MG PO TABS
1000.0000 mg | ORAL_TABLET | Freq: Four times a day (QID) | ORAL | Status: DC
  Administered 2017-07-09 – 2017-07-10 (×4): 1000 mg via ORAL
  Filled 2017-07-09 (×4): qty 2

## 2017-07-09 MED ORDER — 0.9 % SODIUM CHLORIDE (POUR BTL) OPTIME
TOPICAL | Status: DC | PRN
  Administered 2017-07-09: 1000 mL

## 2017-07-09 MED ORDER — BUPIVACAINE-EPINEPHRINE (PF) 0.25% -1:200000 IJ SOLN
INTRAMUSCULAR | Status: AC
  Filled 2017-07-09: qty 30

## 2017-07-09 MED ORDER — SODIUM CHLORIDE 0.9 % IR SOLN
Status: DC | PRN
  Administered 2017-07-09 (×2): 1000 mL

## 2017-07-09 MED ORDER — SUGAMMADEX SODIUM 200 MG/2ML IV SOLN
INTRAVENOUS | Status: DC | PRN
  Administered 2017-07-09: 170 mg via INTRAVENOUS

## 2017-07-09 MED ORDER — POTASSIUM CHLORIDE IN NACL 20-0.9 MEQ/L-% IV SOLN
INTRAVENOUS | Status: DC
  Administered 2017-07-09 – 2017-07-10 (×2): via INTRAVENOUS
  Filled 2017-07-09 (×3): qty 1000

## 2017-07-09 SURGICAL SUPPLY — 54 items
ADH SKN CLS APL DERMABOND .7 (GAUZE/BANDAGES/DRESSINGS) ×1
APPLIER CLIP 5 13 M/L LIGAMAX5 (MISCELLANEOUS) ×3
APR CLP MED LRG 5 ANG JAW (MISCELLANEOUS) ×1
BAG SPEC RTRVL 10 TROC 200 (ENDOMECHANICALS) ×1
BLADE CLIPPER SURG (BLADE) ×2 IMPLANT
CANISTER SUCT 3000ML PPV (MISCELLANEOUS) ×3 IMPLANT
CHLORAPREP W/TINT 26ML (MISCELLANEOUS) ×3 IMPLANT
CLIP APPLIE 5 13 M/L LIGAMAX5 (MISCELLANEOUS) ×1 IMPLANT
CLOSURE WOUND 1/2 X4 (GAUZE/BANDAGES/DRESSINGS) ×1
COVER SURGICAL LIGHT HANDLE (MISCELLANEOUS) ×3 IMPLANT
DERMABOND ADVANCED (GAUZE/BANDAGES/DRESSINGS) ×2
DERMABOND ADVANCED .7 DNX12 (GAUZE/BANDAGES/DRESSINGS) ×1 IMPLANT
DRSG TEGADERM 2-3/8X2-3/4 SM (GAUZE/BANDAGES/DRESSINGS) ×6 IMPLANT
ELECT REM PT RETURN 9FT ADLT (ELECTROSURGICAL) ×3
ELECTRODE REM PT RTRN 9FT ADLT (ELECTROSURGICAL) ×1 IMPLANT
ENDOLOOP SUT PDS II  0 18 (SUTURE) ×4
ENDOLOOP SUT PDS II 0 18 (SUTURE) IMPLANT
GLOVE BIO SURGEON STRL SZ8 (GLOVE) ×2 IMPLANT
GLOVE BIOGEL PI IND STRL 6.5 (GLOVE) IMPLANT
GLOVE BIOGEL PI IND STRL 7.0 (GLOVE) IMPLANT
GLOVE BIOGEL PI IND STRL 8 (GLOVE) ×1 IMPLANT
GLOVE BIOGEL PI INDICATOR 6.5 (GLOVE) ×2
GLOVE BIOGEL PI INDICATOR 7.0 (GLOVE) ×4
GLOVE BIOGEL PI INDICATOR 8 (GLOVE) ×4
GLOVE ECLIPSE 7.5 STRL STRAW (GLOVE) ×3 IMPLANT
GLOVE SURG SS PI 6.0 STRL IVOR (GLOVE) ×2 IMPLANT
GLOVE SURG SS PI 6.5 STRL IVOR (GLOVE) ×2 IMPLANT
GOWN STRL REUS W/ TWL LRG LVL3 (GOWN DISPOSABLE) ×3 IMPLANT
GOWN STRL REUS W/ TWL XL LVL3 (GOWN DISPOSABLE) IMPLANT
GOWN STRL REUS W/TWL LRG LVL3 (GOWN DISPOSABLE) ×6
GOWN STRL REUS W/TWL XL LVL3 (GOWN DISPOSABLE) ×6
GRASPER SUT TROCAR 14GX15 (MISCELLANEOUS) ×2 IMPLANT
HEMOSTAT SNOW SURGICEL 2X4 (HEMOSTASIS) ×4 IMPLANT
KIT BASIN OR (CUSTOM PROCEDURE TRAY) ×3 IMPLANT
KIT TURNOVER KIT B (KITS) ×3 IMPLANT
NS IRRIG 1000ML POUR BTL (IV SOLUTION) ×3 IMPLANT
PAD ARMBOARD 7.5X6 YLW CONV (MISCELLANEOUS) ×3 IMPLANT
PENCIL BUTTON HOLSTER BLD 10FT (ELECTRODE) ×2 IMPLANT
POUCH RETRIEVAL ECOSAC 10 (ENDOMECHANICALS) IMPLANT
POUCH RETRIEVAL ECOSAC 10MM (ENDOMECHANICALS) ×2
SCISSORS LAP 5X35 DISP (ENDOMECHANICALS) ×3 IMPLANT
SET IRRIG TUBING LAPAROSCOPIC (IRRIGATION / IRRIGATOR) ×3 IMPLANT
SLEEVE ENDOPATH XCEL 5M (ENDOMECHANICALS) ×6 IMPLANT
SPECIMEN JAR SMALL (MISCELLANEOUS) ×3 IMPLANT
STRIP CLOSURE SKIN 1/2X4 (GAUZE/BANDAGES/DRESSINGS) ×2 IMPLANT
SUT MNCRL AB 4-0 PS2 18 (SUTURE) ×3 IMPLANT
SUT VICRYL 0 UR6 27IN ABS (SUTURE) ×2 IMPLANT
TOWEL GREEN STERILE (TOWEL DISPOSABLE) ×2 IMPLANT
TRAY LAPAROSCOPIC MC (CUSTOM PROCEDURE TRAY) ×3 IMPLANT
TROCAR XCEL BLUNT TIP 100MML (ENDOMECHANICALS) ×3 IMPLANT
TROCAR XCEL NON-BLD 11X100MML (ENDOMECHANICALS) ×2 IMPLANT
TROCAR XCEL NON-BLD 5MMX100MML (ENDOMECHANICALS) ×3 IMPLANT
TUBING INSUFFLATION (TUBING) ×3 IMPLANT
WATER STERILE IRR 1000ML POUR (IV SOLUTION) ×3 IMPLANT

## 2017-07-09 NOTE — Discharge Instructions (Signed)
[facility] ______[facility] °LAPAROSCOPIC SURGERY: POST OP INSTRUCTIONS °Always review your discharge instruction sheet given to you by the facility where your surgery was performed. °IF YOU HAVE DISABILITY OR FAMILY LEAVE FORMS, YOU MUST BRING THEM TO THE OFFICE FOR PROCESSING.   °DO NOT GIVE THEM TO YOUR DOCTOR. ° °1. A prescription for pain medication may be given to you upon discharge.  Take your pain medication as prescribed, if needed.  If narcotic pain medicine is not needed, then you may take acetaminophen (Tylenol) or ibuprofen (Advil) as needed. °2. Take your usually prescribed medications unless otherwise directed. °3. If you need a refill on your pain medication, please contact your pharmacy.  They will contact our office to request authorization. Prescriptions will not be filled after 5pm or on week-ends. °4. You should follow a light diet the first few days after arrival home, such as soup and crackers, etc.  Be sure to include lots of fluids daily. °5. Most patients will experience some swelling and bruising in the area of the incisions.  Ice packs will help.  Swelling and bruising can take several days to resolve.  °6. It is common to experience some constipation if taking pain medication after surgery.  Increasing fluid intake and taking a stool softener (such as Colace) will usually help or prevent this problem from occurring.  A mild laxative (Milk of Magnesia or Miralax) should be taken according to package instructions if there are no bowel movements after 48 hours. °7. Unless discharge instructions indicate otherwise, you may remove your bandages 24-48 hours after surgery, and you may shower at that time.  You may have steri-strips (small skin tapes) in place directly over the incision.  These strips should be left on the skin for 7-10 days.  If your surgeon used skin glue on the incision, you may shower in 24 hours.  The glue will flake off over the next 2-3 weeks.  Any sutures or  staples will be removed at the office during your follow-up visit. °8. ACTIVITIES:  You may resume regular (light) daily activities beginning the next day--such as daily self-care, walking, climbing stairs--gradually increasing activities as tolerated.  You may have sexual intercourse when it is comfortable.  Refrain from any heavy lifting or straining until approved by your doctor. °a. You may drive when you are no longer taking prescription pain medication, you can comfortably wear a seatbelt, and you can safely maneuver your car and apply brakes. °b. RETURN TO WORK:  __________________________________________________________ °9. You should see your doctor in the office for a follow-up appointment approximately 2-3 weeks after your surgery.  Make sure that you call for this appointment within a day or two after you arrive home to insure a convenient appointment time. °10. OTHER INSTRUCTIONS: __________________________________________________________________________________________________________________________ __________________________________________________________________________________________________________________________ °WHEN TO CALL YOUR DOCTOR: °1. Fever over 101.0 °2. Inability to urinate °3. Continued bleeding from incision. °4. Increased pain, redness, or drainage from the incision. °5. Increasing abdominal pain ° °The clinic staff is available to answer your questions during regular business hours.  Please don’t hesitate to call and ask to speak to one of the nurses for clinical concerns.  If you have a medical emergency, go to the nearest emergency room or call 911.  A surgeon from [facility] is always on call at the hospital. °[address], [address], [place], [place]  [address] ? [address] [address], [place],    [address] °[phone] ? [phone] ? FAX [phone] °Web site:   [email or web address] ° ° °Laparoscopic Cholecystectomy, Care After °This sheet  gives you information about how to care for yourself after your procedure. Your health care provider may also give you more specific instructions. If you have problems or questions, contact your health care provider. °What can I expect after the procedure? °After the procedure, it is common to have: °· Pain at your incision sites. You will be given medicines to control this pain. °· Mild nausea or vomiting. °· Bloating and possible shoulder pain from the air-like gas that was used during the procedure. ° °Follow these instructions at home: °Incision care ° °· Follow instructions from your health care provider about how to take care of your incisions. Make sure you: °? Wash your hands with soap and water before you change your bandage (dressing). If soap and water are not available, use hand sanitizer. °? Change your dressing as told by your health care provider. °? Leave stitches (sutures), skin glue, or adhesive strips in place. These skin closures may need to be in place for 2 weeks or longer. If adhesive strip edges start to loosen and curl up, you may trim the loose edges. Do not remove adhesive strips completely unless your health care provider tells you to do that. °· Do not take baths, swim, or use a hot tub until your health care provider approves. Ask your health care provider if you can take showers. You may only be allowed to take sponge baths for bathing. °· Check your incision area every day for signs of infection. Check for: °? More redness, swelling, or pain. °? More fluid or blood. °? Warmth. °? Pus or a bad smell. °Activity °· Do not drive or use heavy machinery while taking prescription pain medicine. °· Do not lift anything that is heavier than 10 lb (4.5 kg) until your health care provider approves. °· Do not play contact sports until your health care provider approves. °· Do not drive for 24 hours if you were given a medicine to help you relax (sedative). °· Rest as needed. Do not return to work  or school until your health care provider approves. °General instructions °· Take over-the-counter and prescription medicines only as told by your health care provider. °· To prevent or treat constipation while you are taking prescription pain medicine, your health care provider may recommend that you: °? Drink enough fluid to keep your urine clear or pale yellow. °? Take over-the-counter or prescription medicines. °? Eat foods that are high in fiber, such as fresh fruits and vegetables, whole grains, and beans. °? Limit foods that are high in fat and processed sugars, such as fried and sweet foods. °Contact a health care provider if: °· You develop a rash. °· You have more redness, swelling, or pain around your incisions. °· You have more fluid or blood coming from your incisions. °· Your incisions feel warm to the touch. °· You have pus or a bad smell coming from your incisions. °· You have a fever. °· One or more of your incisions breaks open. °Get help right away if: °· You have trouble breathing. °· You have chest pain. °· You have increasing pain in your shoulders. °· You faint or feel dizzy when you stand. °· You have severe pain in your abdomen. °· You have nausea or vomiting that lasts for more than one day. °· You have leg pain. °This information is not intended to replace advice given to you by your health care provider. Make sure you   discuss any questions you have with your health care provider. °Document Released: 02/05/2005 Document Revised: 08/27/2015 Document Reviewed: 07/25/2015 °Elsevier Interactive Patient Education © 2018 [employer] ° °

## 2017-07-09 NOTE — Anesthesia Postprocedure Evaluation (Signed)
Anesthesia Post Note  Patient: Billy Kim  Procedure(s) Performed: LAPAROSCOPIC CHOLECYSTECTOMY (N/A Abdomen)     Patient location during evaluation: PACU Anesthesia Type: General Level of consciousness: awake and alert Pain management: pain level controlled Vital Signs Assessment: post-procedure vital signs reviewed and stable Respiratory status: spontaneous breathing, nonlabored ventilation, respiratory function stable and patient connected to nasal cannula oxygen Cardiovascular status: blood pressure returned to baseline and stable Postop Assessment: no apparent nausea or vomiting Anesthetic complications: no    Last Vitals:  Vitals:   07/09/17 1400 07/09/17 1411  BP: (!) 155/79 (!) 146/80  Pulse: 62 74  Resp: 19 17  Temp:  (!) 36.4 C  SpO2: 97% 96%    Last Pain:  Vitals:   07/09/17 1411  TempSrc:   PainSc: Asleep                 Pinchas Reither,JAMES TERRILL

## 2017-07-09 NOTE — Op Note (Signed)
OPERATIVE REPORT  DATE OF OPERATION: 07/09/2017  PATIENT:  Billy Kim  71 y.o. male  PRE-OPERATIVE DIAGNOSIS:  CHOLEDOCHOLITHIASIS  POST-OPERATIVE DIAGNOSIS:  CHOLEDOCHOLITHIASIS, CHOLELITHIASIS AND CHOLECYSTITIS, EXTENSIVE ABDOMINAL ADHESIONS  INDICATION(S) FOR OPERATION:  Status post ERCP, symptomatic gallstones  FINDINGS:  Acute inflammation of the gallbladder and dense postoperative adhesions  PROCEDURE:  Procedure(s): LAPAROSCOPIC CHOLECYSTECTOMY EXTENSIVE ADHESIOLYSIS  SURGEON:  Surgeon(s): Judeth Horn, MD  ASSISTANT: NOne  ANESTHESIA:   general  COMPLICATIONS:  None  EBL: 100 ml  BLOOD ADMINISTERED: none  DRAINS: none   SPECIMEN:  Source of Specimen:  Gallbladder and contents  COUNTS CORRECT:  YES  PROCEDURE DETAILS: The patient was taken to the operating room and placed on the table in the supine position.  After an adequate endotracheal anesthetic was administered, the patient was prepped with ChloroPrep, and then draped in the usual manner exposing the entire abdomen laterally, inferiorly and up  to the costal margins.  After a proper timeout was performed including identifying the patient and the procedure to be performed, because of prior abdominal surgery entrance into the peritoneal cavity was dependent upon getting into the peritoneum in the right upper quadrant after insufflating with the varies needle.  We then used an Optiview 5 mm cannula to enter the peritoneal cavity in the right upper quadrant.  It was noted that the patient had extensive adhesions and therefore a second 5 mm cannula was placed in the right lower quadrant through which laparoscopic scissors, and electrocautery was used in order to take down these adhesions toward the midline.  Once this was done we had the midline free we used a regular trocar 11 mm cannula to come into the peritoneal cavity under direct vision.  We then passed a 5 mm subxiphoid cannula into the peritoneal cavity and  started our dissection.  The gallbladder with some densely adhesed with scar tissue from the acute inflammation.  Should be noted that it took at least 45 minutes to an hour to take down the adhesions order to get adequate entrance into the peritoneal cavity.  With the patient in reverse Trendelenburg the right side down it took a while to dissect out the extensively inflamed gallbladder.  We did get down to a cystic duct pictures which are included on the chart.  A critical window was identified and taken advantage of for the resection safely not injuring the common bile duct.  This is a large cystic duct measuring approximately 10 mm in size.  There is large enough that he did noted to closure.  No cholangiogram was performed.  There was spillage of bile during the dissection however we did get out the complete gallbladder and retrieved using an EcoPouch retrieval bag.  Once this was done we immigrated the gallbladder and then and then used electrocautery to obtain adequate hemostasis.  Surgicel snow was also placed in the gallbladder fossa.  No drains were left in place.  We removed the midline 11 mm cannula and then closed the fascial site using Endo suture device.  All gas and fluid was allowed to escape through the aspirating device is and subsequently closed the incisions.  0.25% Marcaine with epinephrine was injected at all sites.  We then closed the supraumbilical skin site using a running subcuticular stitch of 4-0 Monocryl.  We then used Dermabond Steri-Strips and Tegaderm used to complete all dressings.  Needle counts, sponge counts, and instrument counts were correct.  The patient was awakened from anesthesia and taken to the  PACU in stable condition.  Kathryne Eriksson. Dahlia Bailiff, MD, [place] [phone] [facility]  PATIENT DISPOSITION:  PACU - hemodynamically stable.   Judeth Horn 5/21/20191:36 PM

## 2017-07-09 NOTE — Transfer of Care (Signed)
Immediate Anesthesia Transfer of Care Note  Patient: Billy Kim  Procedure(s) Performed: LAPAROSCOPIC CHOLECYSTECTOMY (N/A Abdomen)  Patient Location: PACU  Anesthesia Type:General  Level of Consciousness: awake, alert , oriented and patient cooperative  Airway & Oxygen Therapy: Patient Spontanous Breathing  Post-op Assessment: Report given to RN, Post -op Vital signs reviewed and stable and Patient moving all extremities X 4  Post vital signs: Reviewed and stable  Last Vitals:  Vitals Value Taken Time  BP 190/100 07/09/2017  1:42 PM  Temp    Pulse 59 07/09/2017  1:44 PM  Resp 21 07/09/2017  1:44 PM  SpO2 98 % 07/09/2017  1:44 PM  Vitals shown include unvalidated device data.  Last Pain:  Vitals:   07/09/17 1012  TempSrc: Oral  PainSc:          Complications: No apparent anesthesia complications

## 2017-07-09 NOTE — Anesthesia Procedure Notes (Signed)
Procedure Name: Intubation Date/Time: 07/09/2017 11:38 AM Performed by: Lance Coon, CRNA Pre-anesthesia Checklist: Emergency Drugs available, Suction available, Patient identified, Patient being monitored and Timeout performed Patient Re-evaluated:Patient Re-evaluated prior to induction Oxygen Delivery Method: Circle system utilized Preoxygenation: Pre-oxygenation with 100% oxygen Induction Type: IV induction Ventilation: Mask ventilation without difficulty and Oral airway inserted - appropriate to patient size Laryngoscope Size: Miller and 3 Grade View: Grade I Tube type: Oral Tube size: 7.5 mm Number of attempts: 1 Airway Equipment and Method: Stylet Placement Confirmation: ETT inserted through vocal cords under direct vision,  positive ETCO2 and breath sounds checked- equal and bilateral Secured at: 21 cm Tube secured with: Tape Dental Injury: Teeth and Oropharynx as per pre-operative assessment

## 2017-07-09 NOTE — Anesthesia Preprocedure Evaluation (Addendum)
Anesthesia Evaluation  Patient identified by MRN, date of birth, ID band Patient awake    Reviewed: Allergy & Precautions, NPO status , Patient's Chart, lab work & pertinent test results  History of Anesthesia Complications Negative for: history of anesthetic complications  Airway Mallampati: II  TM Distance: >3 FB Neck ROM: Full    Dental  (+) Edentulous Upper, Edentulous Lower   Pulmonary neg pulmonary ROS,    breath sounds clear to auscultation       Cardiovascular hypertension,  Rhythm:Regular Rate:Normal     Neuro/Psych negative neurological ROS     GI/Hepatic Neg liver ROS, PUD,   Endo/Other  negative endocrine ROS  Renal/GU negative Renal ROS     Musculoskeletal   Abdominal   Peds  Hematology negative hematology ROS (+)   Anesthesia Other Findings   Reproductive/Obstetrics                            Anesthesia Physical Anesthesia Plan  ASA: II  Anesthesia Plan: General   Post-op Pain Management:    Induction: Intravenous  PONV Risk Score and Plan: 3 and Ondansetron and Dexamethasone  Airway Management Planned: Oral ETT  Additional Equipment:   Intra-op Plan:   Post-operative Plan: Extubation in OR  Informed Consent: I have reviewed the patients History and Physical, chart, labs and discussed the procedure including the risks, benefits and alternatives for the proposed anesthesia with the patient or authorized representative who has indicated his/her understanding and acceptance.   Dental advisory given  Plan Discussed with: CRNA  Anesthesia Plan Comments:        Anesthesia Quick Evaluation

## 2017-07-10 ENCOUNTER — Encounter (HOSPITAL_COMMUNITY): Payer: Self-pay | Admitting: General Surgery

## 2017-07-10 LAB — COMPREHENSIVE METABOLIC PANEL
ALT: 184 U/L — AB (ref 17–63)
AST: 153 U/L — AB (ref 15–41)
Albumin: 2.9 g/dL — ABNORMAL LOW (ref 3.5–5.0)
Alkaline Phosphatase: 94 U/L (ref 38–126)
Anion gap: 9 (ref 5–15)
BUN: 13 mg/dL (ref 6–20)
CALCIUM: 7.9 mg/dL — AB (ref 8.9–10.3)
CO2: 24 mmol/L (ref 22–32)
Chloride: 107 mmol/L (ref 101–111)
Creatinine, Ser: 1.27 mg/dL — ABNORMAL HIGH (ref 0.61–1.24)
GFR calc Af Amer: >60 mL/min (ref >60)
GFR calc non Af Amer: 54 mL/min — ABNORMAL LOW (ref >60)
GLUCOSE: 106 mg/dL — AB (ref 65–99)
POTASSIUM: 4.1 mmol/L (ref 3.5–5.1)
SODIUM: 140 mmol/L (ref 135–145)
TOTAL PROTEIN: 5.5 g/dL — AB (ref 6.5–8.1)
Total Bilirubin: 2.3 mg/dL — ABNORMAL HIGH (ref 0.3–1.2)

## 2017-07-10 LAB — CBC
HCT: 38.7 % — ABNORMAL LOW (ref 39.0–52.0)
HEMOGLOBIN: 13.3 g/dL (ref 13.0–17.0)
MCH: 30 pg (ref 26.0–34.0)
MCHC: 34.4 g/dL (ref 30.0–36.0)
MCV: 87.4 fL (ref 78.0–100.0)
Platelets: 135 10*3/uL — ABNORMAL LOW (ref 150–400)
RBC: 4.43 MIL/uL (ref 4.22–5.81)
RDW: 13.6 % (ref 11.5–15.5)
WBC: 9.3 10*3/uL (ref 4.0–10.5)

## 2017-07-10 MED ORDER — ACETAMINOPHEN 500 MG PO TABS: 500.0000 mg | ORAL_TABLET | Freq: Three times a day (TID) | ORAL | 0 refills | Status: DC | PRN

## 2017-07-10 MED ORDER — OXYCODONE HCL 5 MG PO TABS: 5.0000 mg | ORAL_TABLET | Freq: Four times a day (QID) | ORAL | 0 refills | Status: DC | PRN

## 2017-07-10 NOTE — Discharge Summary (Signed)
[facility] Discharge Summary   Patient ID: Billy Kim MRN: [id number] DOB/AGE: [birth year] 72 y.o.  Admit date: 07/05/2017 Discharge date: 07/10/2017  Admitting Diagnosis: Choledocholithiasis  Discharge Diagnosis Choledocholithiasis  Consultants Gastroenterology  Imaging: Dg Ercp Biliary & Pancreatic Ducts  Result Date: 07/08/2017 CLINICAL DATA:  72 year old male with a history of choledocholithiasis EXAM: ERCP TECHNIQUE: Multiple spot images obtained with the fluoroscopic device and submitted for interpretation post-procedure. FLUOROSCOPY TIME:  Fluoroscopy Time: 1 minutes 9 seconds COMPARISON:  MR 07/06/2017 FINDINGS: Limited intraoperative fluoroscopic spot images during ERCP. Initial image demonstrates endoscope projecting over the upper abdomen with cannulation of the ampulla and retrograde infusion of contrast. There is vague filling defect within the common bile duct. Subsequently there is deployment of a retrieval balloon. IMPRESSION: Limited images during ERCP demonstrates treatment of choledocholithiasis with deployment of a retrieval balloon. Please refer to the dictated operative report for full details of intraoperative findings and procedure. Electronically Signed   By: Corrie Mckusick D.O.   On: 07/08/2017 12:59    Procedures Dr. Hulen Skains (07/09/17) - Laparoscopic Cholecystectomy  Dr. Lyndel Safe (07/08/17) - ERCP  Hospital Course:  Patient is a 72 year old male who presented to [facility] with abdominal pain.  Workup showed acute cholecystitis with concern for choledocholithiasis.  Patient was admitted and GI was consulted. GI recommended ERCP which was done 5/20. Patient taken to OR for laparoscopic cholecystectomy the following day. Tolerated procedure well and was transferred to the floor.  Diet was advanced as tolerated.  On POD#1, the patient was voiding well, tolerating diet, ambulating well, pain well controlled, vital signs stable, incisions c/d/i and felt stable for  discharge home.  Patient will follow up in our office in 2 weeks and knows to call with questions or concerns.  He will call to confirm appointment date/time.    Physical Exam: General:  Alert, NAD, pleasant, comfortable Abd:  Soft, ND, mild tenderness, incisions C/D/I  I attempted to look this patient up in the controlled substances database, but no matching record was found.   Allergies as of 07/10/2017      Reactions   Amoxicillin Other (See Comments)   Caused headache Has patient had a PCN reaction causing immediate rash, facial/tongue/throat swelling, SOB or lightheadedness with hypotension: No Has patient had a PCN reaction causing severe rash involving mucus membranes or skin necrosis: No Has patient had a PCN reaction that required hospitalization: No Has patient had a PCN reaction occurring within the last 10 years: No If all of the above answers are "NO", then may proceed with Cephalosporin use.   Morphine Sulfate Other (See Comments)   headache   Sulfonamide Derivatives Other (See Comments)   headache      Medication List    TAKE these medications   acetaminophen 500 MG tablet Commonly known as:  TYLENOL Take 1 tablet (500 mg total) by mouth every 8 (eight) hours as needed for mild pain.   carvedilol 3.125 MG tablet Commonly known as:  COREG Take 1 tablet (3.125 mg total) by mouth 2 (two) times daily.   Coenzyme Q10 100 MG capsule Take 100 mg by mouth daily.   [employer] FISH OIL 1000 MG Caps Take 1 capsule by mouth daily. What changed:  how much to take   [place] 250-250-65 MG tablet Generic drug:  aspirin-acetaminophen-caffeine Take 1 tablet by mouth daily as needed (pain). As needed   Garlic 694 MG Tabs Take 500 mg by mouth daily.   ibuprofen 200  MG tablet Commonly known as:  ADVIL Take 2 tablets (400 mg total) by mouth every 8 (eight) hours as needed (with food). What changed:    when to take this  reasons to take this   oxyCODONE 5 MG  immediate release tablet Commonly known as:  Oxy IR/ROXICODONE Take 1-2 tablets (5-10 mg total) by mouth every 6 (six) hours as needed for moderate pain.   vitamin C 1000 MG tablet Take 1,000 mg by mouth daily.        Follow-up Information    [facility], [place] Follow up on 07/23/2017.   Specialty:  General Surgery Why:  your appointment is at 11:45AM.  Bring photo ID and insurance information with you.   Be at the office 30 minutes early for check in.   Contact information: [address]  [place] [address] [phone]           Signed: Brigid Re, [facility] 07/10/2017, 9:16 AM Pager: [phone] Consults: [phone] Mon-Fri 7:00 am-4:30 pm Sat-Sun 7:00 am-11:30 am

## 2017-07-10 NOTE — Progress Notes (Addendum)
1600 Pt feeling a lot better today. Tolerating soft diet. Ambulating in the hall, pain is controlled. Discharge instructions given, discharged to home accompanied by spouse.

## 2017-07-17 ENCOUNTER — Other Ambulatory Visit: Payer: Self-pay | Admitting: *Deleted

## 2017-07-17 NOTE — Patient Outreach (Signed)
[facility]) Care Management  07/17/2017  Billy Kim 1945/04/06 [id number]   Subjective: Telephone call to patient's home  / work  number, no answer, left HIPAA compliant voicemail message, and requested call back.     Objective: Per KPN (Knowledge Performance Now, point of care tool), [employer] iCollaborate, and chart review, patient hospitalized 07/05/17 -07/10/17 for Choledocholithiasis,  status post Laparoscopic Cholecystectomy on 07/09/17, and ERCP on 07/08/17.    Patient has a history of hypertension and Ulcerative colitis (remission).    Assessment: Received [employer] Transition of care referral on 07/16/17.   Transition of care follow up pending patient contact.      Plan: RNCM will send unsuccessful outreach  letter, [facility] pamphlet, will call patient for 2nd telephone outreach attempt, transition of care follow up, and proceed with case closure, within 10 business days if no return call.       Billy Kim, BSN, [place] Management [facility] Telephonic CM Phone: [phone] Fax: [phone]

## 2017-07-19 ENCOUNTER — Other Ambulatory Visit: Payer: Self-pay | Admitting: *Deleted

## 2017-07-19 NOTE — Patient Outreach (Signed)
[facility]) Care Management  07/19/2017  Billy Kim 1945/06/15 [id number]   Subjective: Telephone call to patient's home  / work  number, no answer, left HIPAA compliant voicemail message, and requested call back.     Objective: Per KPN (Knowledge Performance Now, point of care tool), [employer] iCollaborate, and chart review, patient hospitalized 07/05/17 -07/10/17 for Choledocholithiasis,  status post Laparoscopic Cholecystectomyon 07/09/17, and ERCP on 07/08/17.    Patient has a history of hypertension and Ulcerative colitis (remission).    Assessment: Received [employer] Transition of care referral on 07/16/17.   Transition of care follow up pending patient contact.      Plan: RNCM has sent unsuccessful outreach  letter, [facility] pamphlet, will call patient for 3rd telephone outreach attempt, transition of care follow up, and proceed with case closure, within 10 business days if no return call.      [name] H. Annia Friendly, BSN, [place] Management [facility] Telephonic CM Phone: [phone] Fax: [phone]

## 2017-07-22 ENCOUNTER — Other Ambulatory Visit: Payer: Self-pay | Admitting: *Deleted

## 2017-07-22 NOTE — Patient Outreach (Signed)
[facility]) Care Management  07/22/2017  Billy Kim 27-Nov-1945 [id number]   Subjective: Telephone call to patient's mobile number, spoke with patient, and HIPAA verified.  Discussed [facility] Care Management [employer] Transition of care follow up, patient voiced understanding, and is in agreement to follow up.   Patient states he is doing pretty, had excellent care throughout his hospital stay, has follow up appointment with surgeon on 07/23/17, and has returned to work with a light duty / modified schedule.   RNCM provided verbal education regarding diet status post LAPAROSCOPIC CHOLECYSTECTOMY, states he will  review with surgeon during follow up appointment, will review discharge instructions again, patient voices understanding, and is very appreciation of this RNCM reviewing information with him.   Patient states he is able to manage self care and has assistance as needed with activities of daily living / home management.  Patient voices understanding of medical diagnosis, surgery, and treatment plan.   States he is accessing her Christella Scheuermann benefits as needed via member services number on back of card or through [email or web address]   Patient states he does not have any education material, transition of care, care coordination, disease management, disease monitoring, transportation, community resource, or pharmacy needs at this time.   States he is very appreciative of the follow up and is in agreement to receive [place] Management information.     Objective:Per KPN (Knowledge Performance Now, point of care tool), [employer] iCollaborate, and chart review, patient hospitalized5/17/19 -07/10/17 forCholedocholithiasis, status post Laparoscopic Cholecystectomyon 07/09/17, andERCP on 07/08/17. Patient has a history of hypertension and Ulcerative colitis(remission).    Assessment:Received [employer] Transition of care referral on 07/16/17. Transition of care follow up completed, no care management needs,  and will proceed with case closure.      Plan:RNCM will send patient successful outreach letter, [facility] pamphlet, and magnet. RNCM will complete case closure due to follow up completed / no care management needs.        Shayde Gervacio H. Annia Friendly, BSN, [place] Management [facility] Telephonic CM Phone: [phone] Fax: [phone]

## 2017-07-23 ENCOUNTER — Telehealth: Payer: Self-pay | Admitting: *Deleted

## 2017-07-23 NOTE — Telephone Encounter (Signed)
Copied from [place] [id number]. Topic: Appointment Scheduling - Scheduling Inquiry for Clinic >> Jul 23, 2017 10:48 AM Synthia Innocent wrote: Reason for CRM: Had gallbladder surgery on 07/09/17, would like to know if Dr Damita Dunnings would like to see him? Or does he just need to follow up with surgeon.

## 2017-07-23 NOTE — Telephone Encounter (Signed)
Left detailed message on voicemail.  

## 2017-07-23 NOTE — Telephone Encounter (Signed)
I want him to follow up with the surgeon.   I am glad to see him and if he wants to come in about this, then please schedule.  I hate that he had trouble/needed surgery in the first place.  Dr. Hulen Skains is a great doc and I'm glad he saw the patient.  Thanks.

## 2018-03-05 ENCOUNTER — Telehealth: Payer: Self-pay | Admitting: Family Medicine

## 2018-03-05 NOTE — Telephone Encounter (Signed)
Best number [phone] Pt schedule cpx  For 3/10 wants to pick up labs order so he can have this [facility]

## 2018-03-07 ENCOUNTER — Encounter: Payer: Self-pay | Admitting: *Deleted

## 2018-03-07 NOTE — Telephone Encounter (Signed)
Letter done for:  CMET, lipid, CBC with diff.  Dx I10.  PSA Z12.5.   Printed.  Thanks.

## 2018-04-24 ENCOUNTER — Other Ambulatory Visit: Payer: Self-pay | Admitting: Family Medicine

## 2018-04-25 LAB — CBC WITH DIFFERENTIAL/PLATELET
Basophils Absolute: 0.1 10*3/uL (ref 0.0–0.2)
Basos: 1 %
EOS (ABSOLUTE): 0.3 10*3/uL (ref 0.0–0.4)
EOS: 5 %
HEMATOCRIT: 47.3 % (ref 37.5–51.0)
Hemoglobin: 16.5 g/dL (ref 13.0–17.7)
Immature Grans (Abs): 0 10*3/uL (ref 0.0–0.1)
Immature Granulocytes: 0 %
Lymphocytes Absolute: 1.2 10*3/uL (ref 0.7–3.1)
Lymphs: 17 %
MCH: 30.2 pg (ref 26.6–33.0)
MCHC: 34.9 g/dL (ref 31.5–35.7)
MCV: 87 fL (ref 79–97)
MONOS ABS: 0.7 10*3/uL (ref 0.1–0.9)
Monocytes: 10 %
NEUTROS ABS: 4.8 10*3/uL (ref 1.4–7.0)
Neutrophils: 67 %
Platelets: 143 10*3/uL — ABNORMAL LOW (ref 150–450)
RBC: 5.46 x10E6/uL (ref 4.14–5.80)
RDW: 13.6 % (ref 11.6–15.4)
WBC: 7.1 10*3/uL (ref 3.4–10.8)

## 2018-04-25 LAB — COMPREHENSIVE METABOLIC PANEL
ALBUMIN: 4.3 g/dL (ref 3.7–4.7)
ALT: 18 IU/L (ref 0–44)
AST: 22 IU/L (ref 0–40)
Albumin/Globulin Ratio: 2 (ref 1.2–2.2)
Alkaline Phosphatase: 90 IU/L (ref 39–117)
BUN/Creatinine Ratio: 15 (ref 10–24)
BUN: 16 mg/dL (ref 8–27)
Bilirubin Total: 2.1 mg/dL — ABNORMAL HIGH (ref 0.0–1.2)
CALCIUM: 9.1 mg/dL (ref 8.6–10.2)
CO2: 25 mmol/L (ref 20–29)
Chloride: 105 mmol/L (ref 96–106)
Creatinine, Ser: 1.05 mg/dL (ref 0.76–1.27)
GFR calc Af Amer: 81 mL/min/{1.73_m2} (ref >59)
GFR, EST NON AFRICAN AMERICAN: 70 mL/min/{1.73_m2} (ref >59)
Globulin, Total: 2.2 g/dL (ref 1.5–4.5)
Glucose: 105 mg/dL — ABNORMAL HIGH (ref 65–99)
Potassium: 4.2 mmol/L (ref 3.5–5.2)
Sodium: 145 mmol/L — ABNORMAL HIGH (ref 134–144)
TOTAL PROTEIN: 6.5 g/dL (ref 6.0–8.5)

## 2018-04-25 LAB — LIPID PANEL W/O CHOL/HDL RATIO
Cholesterol, Total: 135 mg/dL (ref 100–199)
HDL: 31 mg/dL — AB (ref >39)
LDL CALC: 61 mg/dL (ref 0–99)
Triglycerides: 216 mg/dL — ABNORMAL HIGH (ref 0–149)
VLDL CHOLESTEROL CAL: 43 mg/dL — AB (ref 5–40)

## 2018-04-25 LAB — SPECIMEN STATUS REPORT

## 2018-04-25 LAB — PSA: PROSTATE SPECIFIC AG, SERUM: 4.6 ng/mL — AB (ref 0.0–4.0)

## 2018-04-29 ENCOUNTER — Ambulatory Visit (INDEPENDENT_AMBULATORY_CARE_PROVIDER_SITE_OTHER): Payer: Managed Care, Other (non HMO) | Admitting: Family Medicine

## 2018-04-29 ENCOUNTER — Encounter: Payer: Self-pay | Admitting: Family Medicine

## 2018-04-29 VITALS — BP 146/84 | HR 67 | Temp 98.6°F | Ht 68.0 in | Wt 183.4 lb

## 2018-04-29 DIAGNOSIS — Z0000 Encounter for general adult medical examination without abnormal findings: Secondary | ICD-10-CM

## 2018-04-29 DIAGNOSIS — D696 Thrombocytopenia, unspecified: Secondary | ICD-10-CM

## 2018-04-29 DIAGNOSIS — I10 Essential (primary) hypertension: Secondary | ICD-10-CM

## 2018-04-29 DIAGNOSIS — R972 Elevated prostate specific antigen [PSA]: Secondary | ICD-10-CM

## 2018-04-29 DIAGNOSIS — R0789 Other chest pain: Secondary | ICD-10-CM

## 2018-04-29 DIAGNOSIS — Z7189 Other specified counseling: Secondary | ICD-10-CM

## 2018-04-29 MED ORDER — CARVEDILOL 3.125 MG PO TABS: 3.1250 mg | ORAL_TABLET | Freq: Two times a day (BID) | ORAL | 3 refills | Status: DC

## 2018-04-29 MED ORDER — FAMOTIDINE 20 MG PO TABS: 20.0000 mg | ORAL_TABLET | Freq: Two times a day (BID) | ORAL | Status: DC | PRN

## 2018-04-29 NOTE — Progress Notes (Signed)
CPE- See plan.  Routine anticipatory guidance given to patient.  See health maintenance.  The possibility exists that previously documented standard health maintenance information may have been brought forward from a previous encounter into this note.  If needed, that same information has been updated to reflect the current situation based on today's encounter.    Flu encouraged Shingles d/w pt.  PNA encouraged  Tetanus 2008.   Colonoscopy 2015 PSA elevated.  He has some change with intermittent stream/stopping and starting with voiding.  No burning.  No blood in urine.  Referred to urology 04/2018 Advance directive- wife designated if patient were incapacitated.  HCV screening prev neg, d/w pt.  Diet and exercise discussed with patient.  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 He still has some trouble with recalling names of people. He can still function with work. Knows the year, month, date. 3/3 attention. Can do math. 3/3 recall. WORLD---->DLROW.  Normal testing today. He doesn't have red flag events or sx.  We agreed to monitor for now.    History of thrombocytopenia noted.  No bleeding.  Labs discussed with patient.  Platelet level near baseline.  Hypertension:    Using medication without problems or lightheadedness:  Chest pain with exertion: no exertional sx.  He has had two episodes of discomfort while driving, both resolved after taking Excedrin, brief episodes.   He can walk a mile w/o troubles.  He has heartburn occ, with clear triggers from spicy foods.   Edema: only if prolonged sitting, long drives.   Short of breath: no  PMH and SH reviewed  Meds, vitals, and allergies reviewed.   ROS: Per HPI.  Unless specifically indicated otherwise in HPI, the patient denies:  General: fever. Eyes: acute vision changes ENT: sore throat Cardiovascular: chest pain Respiratory: SOB GI: vomiting GU: dysuria Musculoskeletal: acute back pain Derm: acute rash Neuro: acute  motor dysfunction Psych: worsening mood Endocrine: polydipsia Heme: bleeding Allergy: hayfever  GEN: nad, alert and oriented HEENT: mucous membranes moist NECK: supple w/o LA CV: rrr. PULM: ctab, no inc wob ABD: soft, +bs EXT: no edema SKIN: well perfused.  Likely lipoma on the L upper arm.

## 2018-04-29 NOTE — Patient Instructions (Addendum)
We'll call about setting you up with urology.    Take pepcid if needed and update me as needed.   Check with your insurance to see if they will cover the tetanus shot. I would get a flu shot each fall.   Take care.  Glad to see you.

## 2018-04-30 NOTE — Assessment & Plan Note (Signed)
History of thrombocytopenia noted.  No bleeding.  Labs discussed with patient.  Platelet level near baseline.

## 2018-04-30 NOTE — Assessment & Plan Note (Signed)
He has an old right bundle branch block but no acute changes on EKG otherwise.  He has no exertional chest pain.  The pain described above is atypical and sounds to be low risk.  He can walk up to a mile without troubles.  He does have occasional heartburn and that seems to be a more likely issue with episodic symptoms at rest.  Routine cautions given to patient.  He can try taking Pepcid.  If he has worsening of symptoms I want him to let me know/seek evaluation.  He agrees.

## 2018-04-30 NOTE — Assessment & Plan Note (Signed)
PSA elevated discussed with patient.  He has some change with intermittent stream/stopping and starting with voiding.  No burning.  No blood in urine.  Referred to urology 04/2018.  He agrees with plan.  Defer rectal exam at this point as I am going to send him to urology regardless of the findings on rectal exam.  He agrees.

## 2018-04-30 NOTE — Assessment & Plan Note (Signed)
Advance directive- wife designated if patient were incapacitated.  

## 2018-04-30 NOTE — Assessment & Plan Note (Signed)
Flu encouraged Shingles d/w pt.  PNA encouraged  Tetanus 2008.   Colonoscopy 2015 PSA elevated.  He has some change with intermittent stream/stopping and starting with voiding.  No burning.  No blood in urine.  Referred to urology 04/2018 Advance directive- wife designated if patient were incapacitated.  HCV screening prev neg, d/w pt.   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 He still has some trouble with recalling names of people. He can still function with work. Knows the year, month, date. 3/3 attention. Can do math. 3/3 recall. WORLD---->DLROW.  Normal testing today. He doesn't have red flag events or sx.  We agreed to monitor for now.

## 2019-02-27 ENCOUNTER — Ambulatory Visit (INDEPENDENT_AMBULATORY_CARE_PROVIDER_SITE_OTHER): Payer: Managed Care, Other (non HMO) | Admitting: Family Medicine

## 2019-02-27 DIAGNOSIS — B0230 Zoster ocular disease, unspecified: Secondary | ICD-10-CM | POA: Diagnosis not present

## 2019-02-27 MED ORDER — GABAPENTIN 300 MG PO CAPS: 300.0000 mg | ORAL_CAPSULE | Freq: Three times a day (TID) | ORAL | 3 refills | Status: DC

## 2019-02-27 NOTE — Progress Notes (Signed)
Interactive audio and video telecommunications were attempted between this provider and patient, however failed, due to patient having technical difficulties OR patient did not have access to video capability.  We continued and completed visit with audio only.   Virtual Visit via Telephone Note  I connected with patient on 02/27/19  at 3:41 PM  by telephone and verified that I am speaking with the correct person using two identifiers.  Location of patient: home  Location of MD: [place] Name of referring provider (if blank then none associated): Names per persons and role in encounter:  MD: Earlyne Iba, Patient: name listed above.    I discussed the limitations, risks, security and privacy concerns of performing an evaluation and management service by telephone and the availability of in person appointments. I also discussed with the patient that there may be a patient responsible charge related to this service. The patient expressed understanding and agreed to proceed.  CC: shingles  History of Present Illness: dx'd with shingles, "felt like something was in my left eye."  Seen at eye clinic.  Rash along the eyebrow/lid.  dx'd about 4 weeks ago.  He took valtrex, recently completed.  He isn't having new spots erupt.  He still has L sided facial pain, V1/V2 distribution per his description.  He is taking gabapentin 300mg  BID with minimal help.  No ADE on med.  Rash has resolved in the meantime after crusting but skin sensitivity remains.  He has about 10 gabapentin pills left.    He still has gritty sensation in the L eye with blurry vision but "the eye looked good" per patient report about most recent eye exam.   Observations/Objective:  Speech normal. No apparent distress.  Assessment and Plan: Shingles, facial, previous eye clinic evaluation done.  Status post Valtrex.  Discussed options.  He has likely been taking insufficient amounts of gabapentin.  In the meantime, would  increase gabapentin gradually with routine cautions and he'll update me next week.  He agrees with plan.  Follow Up Instructions: See above.   I discussed the assessment and treatment plan with the patient. The patient was provided an opportunity to ask questions and all were answered. The patient agreed with the plan and demonstrated an understanding of the instructions.   The patient was advised to call back or seek an in-person evaluation if the symptoms worsen or if the condition fails to improve as anticipated.  I provided 17 minutes of non-face-to-face time during this encounter.   Elsie Stain, MD

## 2019-03-01 DIAGNOSIS — B029 Zoster without complications: Secondary | ICD-10-CM | POA: Insufficient documentation

## 2019-03-01 NOTE — Addendum Note (Signed)
Addended by: Tonia Ghent on: 03/01/2019 09:47 PM   Modules accepted: Level of Service

## 2019-03-01 NOTE — Assessment & Plan Note (Signed)
Shingles, facial, previous eye clinic evaluation done.  Status post Valtrex.  Discussed options.  He has likely been taking insufficient amounts of gabapentin.  In the meantime, would increase gabapentin gradually with routine cautions and he'll update me next week.  He agrees with plan.

## 2019-03-03 ENCOUNTER — Telehealth: Payer: Self-pay | Admitting: Family Medicine

## 2019-03-03 MED ORDER — PREGABALIN 25 MG PO CAPS: 25.0000 mg | ORAL_CAPSULE | Freq: Two times a day (BID) | ORAL | 1 refills | Status: DC

## 2019-03-03 NOTE — Telephone Encounter (Signed)
Called Pt back with information about new medication Lyrica. Pt stated he understood with No questions.

## 2019-03-03 NOTE — Telephone Encounter (Signed)
Pt called stating he thinks he is allergic to gabapentin.  After taking this he get a really back headache.  Stopped taking this on Friday 1/8.  Regina l wanted me to ask  No sob chest pains.  [facility] [address]

## 2019-03-03 NOTE — Telephone Encounter (Signed)
Noted, med list updated.  Please have him try lyrica with sedation caution.  Start with 25mg  twice a day, see if tolerated and see if that helps with the pain. Please update me as in a few days, sooner if needed.  Thanks.

## 2019-03-05 MED ORDER — PREGABALIN 25 MG PO CAPS: 50.0000 mg | ORAL_CAPSULE | Freq: Two times a day (BID) | ORAL | Status: DC

## 2019-03-05 NOTE — Telephone Encounter (Signed)
Patient called back to give update.  He stated that he is still having contentious headaches  He said he took the new medication as prescribed and still having headaches  Please advise

## 2019-03-05 NOTE — Telephone Encounter (Signed)
If he has tolerated lyrica w/o ADE, but without relief of pain, I would gradually increase the dose to 50mg  twice a day.  If he doesn't tolerate or get relief then let me know.  We may have to gradually increase the dose up to 75mg  BID.  Thanks.

## 2019-03-05 NOTE — Telephone Encounter (Signed)
Called and left a message for the patient to return a call back to discuss medication.  Galilee Pierron,cma

## 2019-03-06 MED ORDER — HYDROCODONE-ACETAMINOPHEN 5-325 MG PO TABS: 1.0000 | ORAL_TABLET | Freq: Four times a day (QID) | ORAL | 0 refills | Status: DC | PRN

## 2019-03-06 NOTE — Telephone Encounter (Signed)
Would stop lyrica.  The next option is hydrocodone.  He didn't tolerate morphine but maybe able to tolerate hydrocodone.  Sedation caution.  rx sent.  Update me after trying that.  Thanks.

## 2019-03-06 NOTE — Telephone Encounter (Signed)
Notified pt Rx Hydrocodone sent to pharmcy and with instructions by Dr. Damita Dunnings.

## 2019-03-06 NOTE — Telephone Encounter (Signed)
Called pt and stated --stop taking Rx Lyrica 50mg  BID last taken yesterday morning due to headache is getting worse. Taking tylenol as needed. Denied vomiting/fever.

## 2019-03-09 ENCOUNTER — Telehealth: Payer: Self-pay

## 2019-03-09 NOTE — Telephone Encounter (Signed)
Pt's wife contacted office to see if prescription was updated. Advised this nurse talked to pharmacist this morning and the script should be ready. Pt's wife appreciative. Advised if the medication does not help to contact this office. Pt's wife verbalized understanding.

## 2019-03-09 NOTE — Telephone Encounter (Signed)
[place] Night - Client TELEPHONE ADVICE RECORD [facility] Patient Name: Billy Kim Gender: Male DOB: 09-03-1945 Age: 74 Y 10 M 7 D Return Phone Number: Address: City/State/Zip: [place] Client [place] Night - Client Client Site [place] Physician Renford Dills - MD Contact Type Call Who Is Calling Pharmacy Call Type Pharmacy Send to RN Chief Complaint Prescription Refill or Medication Request (non symptomatic) Reason for Call Request to clarify medication order Initial Comment Caller states that their is not strength on the Script for Norco that was sent over. Additional Comment Pharmacy Name [facility] Pharmacist Name [place] Number [phone] Translation No Nurse Assessment Guidelines Guideline Title Affirmed Question Affirmed Notes Nurse Date/Time Eilene Ghazi Time) Disp. Time Eilene Ghazi Time) Disposition Final User 03/06/2019 6:08:38 PM Send To RN Personal Pearletha Alfred, RN, Kaila 03/06/2019 6:48:04 PM Pharmacy Call Zayas, RN, [place] Reason: Call to [facility] ph# [phone] with pharmasitt- Informed nurses unable to call for dosages of controlled substances after hours--ppharmacist states she will inform pt if he calls or comes by pharmacy-advised to please inform pt to call Mon during office hours to discuss med issue. Pharmacist verbalizes understanding/ appr of info/pt instruction. 03/06/2019 6:48:10 PM Clinical Call Yes Zayas, RN, Lenna Sciara

## 2019-03-09 NOTE — Telephone Encounter (Signed)
Shireen Quan, pharmacist at [facility], and advised of strength on script. She reports it did not show on the script they received electronically. Advised of dose. Colletta [place] read back for verification.

## 2019-03-09 NOTE — Telephone Encounter (Signed)
[place] Night - Client TELEPHONE ADVICE RECORD [facility] Patient Name: Billy Kim Gender: Male DOB: 1945-07-11 Age: 74 Y 10 M 8 D Return Phone Number: [phone] (Primary), [phone] (Secondary) Address[place] [address] Client [place] Night - Client Client Site [place] Physician Renford Dills - MD Contact Type Call Who Is Calling Patient / Member / Family / Caregiver Call Type Triage / Clinical Caller Name [name] Relationship To Patient Spouse Return Phone Number [phone] (Primary) Chief Complaint Rash - Widespread Reason for Call Medication Question / Request Initial Comment Office called in Rx for her spouse, but no strength listed. Pt has shingles. [facility] [place] Translation No Nurse Assessment Nurse: Chesley Noon, RN, Lattie Haw Date/Time (Eastern Time): 03/06/2019 9:08:15 PM Confirm and document reason for call. If symptomatic, describe symptoms. ---Caller states her husband has shingles, MD has called in the medication but forgot to add the strength. Has the patient had close contact with a person known or suspected to have the novel coronavirus illness OR traveled / lives in area with major community spread (including international travel) in the last 14 days from the onset of symptoms? * If Asymptomatic, screen for exposure and travel within the last 14 days. ---No Does the patient have any new or worsening symptoms? ---No Please document clinical information provided and list any resource used. ---Caller advised to contact the office when open for medication needs for controlled substances. Guidelines Guideline Title Affirmed Question Affirmed Notes Nurse Date/Time (Eastern Time) Disp. Time Eilene Ghazi Time) Disposition Final User 03/06/2019 9:18:07 PM Clinical Call Yes Chesley Noon, RN, Lattie Haw

## 2019-04-24 ENCOUNTER — Telehealth: Payer: Self-pay | Admitting: Family Medicine

## 2019-04-24 NOTE — Telephone Encounter (Signed)
Pt has cpx scheduled 3/12 and would like an rx for lab work so he can get this done at [facility]  Please advise when ready for pick up

## 2019-04-26 NOTE — Telephone Encounter (Signed)
Hard copy done, please notify pt for pick up or send to patient.  Thanks.

## 2019-04-27 NOTE — Telephone Encounter (Signed)
Patient advised. Script for labs placed up front for pick up.

## 2019-04-27 NOTE — Telephone Encounter (Signed)
lugene  Do you have hard copy for rx for labs

## 2019-04-29 ENCOUNTER — Other Ambulatory Visit: Payer: Self-pay | Admitting: Family Medicine

## 2019-04-30 LAB — COMPREHENSIVE METABOLIC PANEL
ALT: 16 IU/L (ref 0–44)
AST: 23 IU/L (ref 0–40)
Albumin/Globulin Ratio: 1.6 (ref 1.2–2.2)
Albumin: 3.9 g/dL (ref 3.7–4.7)
Alkaline Phosphatase: 85 IU/L (ref 39–117)
BUN/Creatinine Ratio: 14 (ref 10–24)
BUN: 15 mg/dL (ref 8–27)
Bilirubin Total: 2.1 mg/dL — ABNORMAL HIGH (ref 0.0–1.2)
CO2: 23 mmol/L (ref 20–29)
Calcium: 8.8 mg/dL (ref 8.6–10.2)
Chloride: 106 mmol/L (ref 96–106)
Creatinine, Ser: 1.07 mg/dL (ref 0.76–1.27)
GFR calc Af Amer: 78 mL/min/{1.73_m2} (ref >59)
GFR calc non Af Amer: 68 mL/min/{1.73_m2} (ref >59)
Globulin, Total: 2.4 g/dL (ref 1.5–4.5)
Glucose: 98 mg/dL (ref 65–99)
Potassium: 3.9 mmol/L (ref 3.5–5.2)
Sodium: 143 mmol/L (ref 134–144)
Total Protein: 6.3 g/dL (ref 6.0–8.5)

## 2019-04-30 LAB — CBC WITH DIFFERENTIAL/PLATELET
Basophils Absolute: 0.1 10*3/uL (ref 0.0–0.2)
Basos: 1 %
EOS (ABSOLUTE): 0.4 10*3/uL (ref 0.0–0.4)
Eos: 6 %
Hematocrit: 44.9 % (ref 37.5–51.0)
Hemoglobin: 16.2 g/dL (ref 13.0–17.7)
Immature Grans (Abs): 0 10*3/uL (ref 0.0–0.1)
Immature Granulocytes: 0 %
Lymphocytes Absolute: 1 10*3/uL (ref 0.7–3.1)
Lymphs: 15 %
MCH: 32.5 pg (ref 26.6–33.0)
MCHC: 36.1 g/dL — ABNORMAL HIGH (ref 31.5–35.7)
MCV: 90 fL (ref 79–97)
Monocytes Absolute: 0.7 10*3/uL (ref 0.1–0.9)
Monocytes: 11 %
Neutrophils Absolute: 4.3 10*3/uL (ref 1.4–7.0)
Neutrophils: 67 %
Platelets: 129 10*3/uL — ABNORMAL LOW (ref 150–450)
RBC: 4.99 x10E6/uL (ref 4.14–5.80)
RDW: 13.2 % (ref 11.6–15.4)
WBC: 6.4 10*3/uL (ref 3.4–10.8)

## 2019-04-30 LAB — SPECIMEN STATUS REPORT

## 2019-04-30 LAB — LIPID PANEL W/O CHOL/HDL RATIO
Cholesterol, Total: 144 mg/dL (ref 100–199)
HDL: 33 mg/dL — ABNORMAL LOW (ref >39)
LDL Chol Calc (NIH): 76 mg/dL (ref 0–99)
Triglycerides: 207 mg/dL — ABNORMAL HIGH (ref 0–149)
VLDL Cholesterol Cal: 35 mg/dL (ref 5–40)

## 2019-05-01 ENCOUNTER — Ambulatory Visit (INDEPENDENT_AMBULATORY_CARE_PROVIDER_SITE_OTHER): Payer: Medicare Other | Admitting: Family Medicine

## 2019-05-01 ENCOUNTER — Other Ambulatory Visit: Payer: Self-pay

## 2019-05-01 ENCOUNTER — Encounter: Payer: Self-pay | Admitting: Family Medicine

## 2019-05-01 VITALS — BP 152/82 | HR 71 | Temp 97.2°F | Ht 68.0 in | Wt 183.0 lb

## 2019-05-01 DIAGNOSIS — D696 Thrombocytopenia, unspecified: Secondary | ICD-10-CM

## 2019-05-01 DIAGNOSIS — Z0000 Encounter for general adult medical examination without abnormal findings: Secondary | ICD-10-CM | POA: Diagnosis not present

## 2019-05-01 DIAGNOSIS — I10 Essential (primary) hypertension: Secondary | ICD-10-CM

## 2019-05-01 DIAGNOSIS — N529 Male erectile dysfunction, unspecified: Secondary | ICD-10-CM

## 2019-05-01 DIAGNOSIS — B0230 Zoster ocular disease, unspecified: Secondary | ICD-10-CM

## 2019-05-01 DIAGNOSIS — Z7189 Other specified counseling: Secondary | ICD-10-CM

## 2019-05-01 MED ORDER — CARVEDILOL 3.125 MG PO TABS: 3.1250 mg | ORAL_TABLET | Freq: Two times a day (BID) | ORAL | 3 refills | Status: DC

## 2019-05-01 NOTE — Progress Notes (Signed)
This visit occurred during the SARS-CoV-2 public health emergency.  Safety protocols were in place, including screening questions prior to the visit, additional usage of staff PPE, and extensive cleaning of exam room while observing appropriate contact time as indicated for disinfecting solutions.  CPE- See plan.  Routine anticipatory guidance given to patient.  See health maintenance.  The possibility exists that previously documented standard health maintenance information may have been brought forward from a previous encounter into this note.  If needed, that same information has been updated to reflect the current situation based on today's encounter.    Flu encouraged, defer until after covid shot done.   Shingles d/w pt. defer with recent shingles case 2021.   PNA encouraged, defer until after covid shot done.   Tetanus 2008. defer until after covid shot done.   covid vaccine d/w pt.   Colonoscopy 2015 PSA prev elevated with f/u with urology pending.  I'll defer, pt agrees.  Advance directive- wife designated if patient were incapacitated.  HCV screening prev neg, d/w pt.  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 He still has some trouble with recalling names of people, d/w pt. He can still function with work. Knows the year, month, date. 3/3 attention. Can do math. 3/3 recall. WORLD---->DLROW. Normal testing today. He doesn't have red flag events or sx.  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  We agreed to monitor for now.    Shingles hx d/w pt.  Still with some discomfort L eye.  Gritty feeling in the eye is resolved.  L V1 and V2 still sore to the touch, but better than prev.  No R sided changes.  Vision is back to normal.  He can tolerate as is.  No pain med recently needed.    Hypertension:    Using medication without problems or lightheadedness: yes Chest pain with  exertion:no Edema:no Short of breath:no I asked him to check his BP at home and update me as needed.    ED noted.  I want him to recheck BP at home and he also as urology at follow up.  D/w pt.   History of thrombocytopenia.  Stable dec in platelets.  Not bleeding.  Cbc d/w pt.    PMH and SH reviewed  Meds, vitals, and allergies reviewed.   ROS: Per HPI.  Unless specifically indicated otherwise in HPI, the patient denies:  General: fever. Eyes: acute vision changes ENT: sore throat Cardiovascular: chest pain Respiratory: SOB GI: vomiting GU: dysuria Musculoskeletal: acute back pain Derm: acute rash Neuro: acute motor dysfunction Psych: worsening mood Endocrine: polydipsia Heme: bleeding Allergy: hayfever  GEN: nad, alert and oriented HEENT: ncat NECK: supple w/o LA CV: rrr. PULM: ctab, no inc wob ABD: soft, +bs EXT: no edema SKIN: no acute rash  Recheck blood pressure right arm 130/90, left arm 136/88, meeting similar readings in each arm and reasonable control at the office visit but I want to know about his readings at home.

## 2019-05-01 NOTE — Patient Instructions (Addendum)
Let me know if your BP stays >140/>90.  If you continue to have troubles otherwise then let me know.   Let me know if your memory is worse after your complete the move.   I would get a covid vaccine when possible.   Take care.  Glad to see you.

## 2019-05-03 NOTE — Assessment & Plan Note (Signed)
Advance directive- wife designated if patient were incapacitated.  

## 2019-05-03 NOTE — Assessment & Plan Note (Addendum)
Flu encouraged, defer until after covid shot done.   Shingles d/w pt. defer with recent shingles case 2021.   PNA encouraged, defer until after covid shot done.   Tetanus 2008. defer until after covid shot done.   covid vaccine d/w pt.   Colonoscopy 2015 PSA prev elevated with f/u with urology pending.  I'll defer, pt agrees.  Advance directive- wife designated if patient were incapacitated.  HCV screening prev neg, d/w pt.  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 He still has some trouble with recalling names of people, d/w pt. He can still function with work. Knows the year, month, date. 3/3 attention. Can do math. 3/3 recall. WORLD---->DLROW. Normal testing today. He doesn't have red flag events or sx.  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  We agreed to monitor for now.

## 2019-05-04 NOTE — Assessment & Plan Note (Signed)
  Shingles hx d/w pt.  Still with some discomfort L eye.  Gritty feeling in the eye is resolved.  L V1 and V2 still sore to the touch, but better than prev.  No R sided changes.  Vision is back to normal.  He can tolerate as is.  No pain med recently needed.

## 2019-05-04 NOTE — Assessment & Plan Note (Signed)
I asked him to check his BP at home and update me as needed.   Continue carvedilol.

## 2019-05-04 NOTE — Assessment & Plan Note (Signed)
ED noted.  I want him to recheck BP at home and he also as urology at follow up.  D/w pt.   If his blood pressure is too low on home check then this could contribute and I want him to update me as needed.

## 2019-05-04 NOTE — Assessment & Plan Note (Signed)
Stable dec in platelets.  Not bleeding.  Cbc d/w pt.

## 2019-05-29 ENCOUNTER — Encounter: Payer: Self-pay | Admitting: Family Medicine

## 2019-05-29 LAB — LAB REPORT - SCANNED
ALT: 16 (ref 3–30)
AST: 23
Cholesterol: 144
Creatinine, Ser: 1.07
Glucose: 98
HDL: 33
Hemoglobin: 16.2
LDL (calc): 76
Platelets: 129
Triglycerides: 207 — AB (ref 40–160)
WBC: 6.4

## 2019-10-01 ENCOUNTER — Emergency Department (HOSPITAL_COMMUNITY): Payer: Managed Care, Other (non HMO)

## 2019-10-01 ENCOUNTER — Encounter (HOSPITAL_COMMUNITY): Payer: Self-pay | Admitting: *Deleted

## 2019-10-01 ENCOUNTER — Other Ambulatory Visit: Payer: Self-pay

## 2019-10-01 ENCOUNTER — Observation Stay (HOSPITAL_COMMUNITY)
Admission: EM | Admit: 2019-10-01 | Discharge: 2019-10-02 | Disposition: A | Discharge status: Home / Self Care | Payer: Managed Care, Other (non HMO) | Attending: Internal Medicine | Admitting: Internal Medicine

## 2019-10-01 DIAGNOSIS — R079 Chest pain, unspecified: Principal | ICD-10-CM | POA: Insufficient documentation

## 2019-10-01 DIAGNOSIS — Z7982 Long term (current) use of aspirin: Secondary | ICD-10-CM | POA: Diagnosis not present

## 2019-10-01 DIAGNOSIS — Z20822 Contact with and (suspected) exposure to covid-19: Secondary | ICD-10-CM | POA: Diagnosis not present

## 2019-10-01 DIAGNOSIS — I10 Essential (primary) hypertension: Secondary | ICD-10-CM | POA: Diagnosis not present

## 2019-10-01 LAB — SARS CORONAVIRUS 2 BY RT PCR (HOSPITAL ORDER, PERFORMED IN ~~LOC~~ HOSPITAL LAB): SARS Coronavirus 2: NEGATIVE

## 2019-10-01 LAB — TROPONIN I (HIGH SENSITIVITY)
Troponin I (High Sensitivity): 4 ng/L (ref <18)
Troponin I (High Sensitivity): 5 ng/L (ref <18)

## 2019-10-01 LAB — CBC
HCT: 43.2 % (ref 39.0–52.0)
Hemoglobin: 15 g/dL (ref 13.0–17.0)
MCH: 30.7 pg (ref 26.0–34.0)
MCHC: 34.7 g/dL (ref 30.0–36.0)
MCV: 88.3 fL (ref 80.0–100.0)
Platelets: 116 10*3/uL — ABNORMAL LOW (ref 150–400)
RBC: 4.89 MIL/uL (ref 4.22–5.81)
RDW: 13.1 % (ref 11.5–15.5)
WBC: 6.4 10*3/uL (ref 4.0–10.5)
nRBC: 0 % (ref 0.0–0.2)

## 2019-10-01 LAB — BASIC METABOLIC PANEL
Anion gap: 9 (ref 5–15)
BUN: 20 mg/dL (ref 8–23)
CO2: 24 mmol/L (ref 22–32)
Calcium: 8.4 mg/dL — ABNORMAL LOW (ref 8.9–10.3)
Chloride: 105 mmol/L (ref 98–111)
Creatinine, Ser: 1.33 mg/dL — ABNORMAL HIGH (ref 0.61–1.24)
GFR calc Af Amer: >60 mL/min (ref >60)
GFR calc non Af Amer: 52 mL/min — ABNORMAL LOW (ref >60)
Glucose, Bld: 95 mg/dL (ref 70–99)
Potassium: 4.4 mmol/L (ref 3.5–5.1)
Sodium: 138 mmol/L (ref 135–145)

## 2019-10-01 LAB — LIPID PANEL
Cholesterol: 124 mg/dL (ref 0–200)
HDL: 23 mg/dL — ABNORMAL LOW (ref >40)
LDL Cholesterol: 56 mg/dL (ref 0–99)
Total CHOL/HDL Ratio: 5.4 RATIO
Triglycerides: 224 mg/dL — ABNORMAL HIGH (ref <150)
VLDL: 45 mg/dL — ABNORMAL HIGH (ref 0–40)

## 2019-10-01 MED ORDER — COENZYME Q10 100 MG PO CAPS: 100.0000 mg | ORAL_CAPSULE | Freq: Every day | ORAL | Status: DC

## 2019-10-01 MED ORDER — ASPIRIN-ACETAMINOPHEN-CAFFEINE 250-250-65 MG PO TABS
1.0000 | ORAL_TABLET | Freq: Every day | ORAL | Status: DC | PRN
  Filled 2019-10-01: qty 1

## 2019-10-01 MED ORDER — ENOXAPARIN SODIUM 40 MG/0.4ML ~~LOC~~ SOLN
40.0000 mg | SUBCUTANEOUS | Status: DC
  Administered 2019-10-01 – 2019-10-02 (×2): 40 mg via SUBCUTANEOUS
  Filled 2019-10-01 (×2): qty 0.4

## 2019-10-01 MED ORDER — ONDANSETRON HCL 4 MG/2ML IJ SOLN: 4.0000 mg | Freq: Four times a day (QID) | INTRAMUSCULAR | Status: DC | PRN

## 2019-10-01 MED ORDER — GARLIC 500 MG PO TABS: 500.0000 mg | ORAL_TABLET | Freq: Every day | ORAL | Status: DC

## 2019-10-01 MED ORDER — ASCORBIC ACID 500 MG PO TABS
1000.0000 mg | ORAL_TABLET | Freq: Every day | ORAL | Status: DC
  Administered 2019-10-02: 1000 mg via ORAL
  Filled 2019-10-01: qty 2

## 2019-10-01 MED ORDER — CARVEDILOL 3.125 MG PO TABS
3.1250 mg | ORAL_TABLET | Freq: Two times a day (BID) | ORAL | Status: DC
  Administered 2019-10-01 – 2019-10-02 (×2): 3.125 mg via ORAL
  Filled 2019-10-01 (×2): qty 1

## 2019-10-01 MED ORDER — FAMOTIDINE 20 MG PO TABS: 20.0000 mg | ORAL_TABLET | Freq: Every day | ORAL | Status: DC | PRN

## 2019-10-01 MED ORDER — ACETAMINOPHEN 325 MG PO TABS: 650.0000 mg | ORAL_TABLET | ORAL | Status: DC | PRN

## 2019-10-01 NOTE — ED Provider Notes (Signed)
[facility] EMERGENCY DEPARTMENT Provider Note   CSN: [id number] Arrival date & time: 10/01/19  1135     History Chief Complaint  Patient presents with  . Chest Pain    Billy Kim is a 74 y.o. male.  74 year old male presents with exertional chest pain times several days.  Pain is worse with physical activity and better with rest.  Starts from the right side the left side of his chest and characterizes pressure.  No associated diaphoresis or dyspnea.  No nausea or vomiting.  Had a catheterization according to him back in 2003 which did not show any acute findings.  Patient called EMS due to his symptoms and was given aspirin as well as nitroglycerin which she set made the pain much better.  Does have a strong family history of coronary disease.  He also has a history of hypertension.          Past Medical History:  Diagnosis Date  . Hypertension   . Ulcerative colitis 5/81   Remission for years    Patient Active Problem List   Diagnosis Date Noted  . Shingles 03/01/2019  . FH: prostate cancer 02/21/2017  . Advance care planning 03/31/2014  . PSA elevation 03/27/2013  . Thrombocytopenia, unspecified ([place]) 03/27/2013  . Routine general medical examination at a health care facility 12/28/2011  . ED (erectile dysfunction) 12/28/2011  . Neoplasm of uncertain behavior of skin 12/27/2010  . HYPERTENSION, BENIGN ESSENTIAL 04/08/2007  . HYPERTRIGLYCERIDEMIA 01/08/2007  . GILBERT'S SYNDROME 01/08/2007  . ULCERATIVE COLITIS 01/08/2007    Past Surgical History:  Procedure Laterality Date  . CARDIAC CATHETERIZATION  03/20/01   Cardiolite EF 55% 02/10/02  . CHOLECYSTECTOMY N/A 07/09/2017   Procedure: LAPAROSCOPIC CHOLECYSTECTOMY;  Surgeon: Judeth Horn, MD;  Location: [facility];  Service: General;  Laterality: N/A;  . COLONOSCOPY  multiple  . ENDOSCOPIC RETROGRADE CHOLANGIOPANCREATOGRAPHY (ERCP) WITH PROPOFOL N/A 07/08/2017   Procedure: ENDOSCOPIC RETROGRADE  CHOLANGIOPANCREATOGRAPHY (ERCP) WITH PROPOFOL;  Surgeon: Jackquline Denmark, MD;  [facility];  Service: Endoscopy;  Laterality: N/A;  . INGUINAL HERNIA REPAIR  04/09/06   Bilateral  . LAPAROSCOPIC APPENDECTOMY  03/1981  . REMOVAL OF STONES  07/08/2017   Procedure: REMOVAL OF STONES;  Surgeon: Jackquline Denmark, MD;  Location: [facility] ENDOSCOPY;  Service: Endoscopy;;  . Joan Mayans  07/08/2017   Procedure: Joan Mayans;  Surgeon: Jackquline Denmark, MD;  Location: [facility] ENDOSCOPY;  Service: Endoscopy;;       Family History  Problem Relation Age of Onset  . Cancer Mother        ? GYN, died after hysterectomy  . Stroke Father   . Cancer Brother 70       Bladder  . Prostate cancer Brother   . Prostate cancer Brother   . Colon cancer Neg Hx   . Esophageal cancer Neg Hx   . Rectal cancer Neg Hx   . Stomach cancer Neg Hx     Social History   Tobacco Use  . Smoking status: Never Smoker  . Smokeless tobacco: Never Used  Substance Use Topics  . Alcohol use: No  . Drug use: No    Home Medications Prior to Admission medications   Medication Sig Start Date End Date Taking? Authorizing Provider  Ascorbic Acid (VITAMIN C) 1000 MG tablet Take 1,000 mg by mouth daily.      [provider]  aspirin-acetaminophen-caffeine (EXCEDRIN EXTRA STRENGTH) [phone] MG per tablet Take 1 tablet by mouth daily as needed (pain). As needed  [provider]  carvedilol (COREG) 3.125 MG tablet Take 1 tablet (3.125 mg total) by mouth 2 (two) times daily. 05/01/19   Tonia Ghent, MD  Coenzyme Q10 (COQ10) 100 MG capsule Take 100 mg by mouth daily.     [provider]  famotidine (PEPCID) 20 MG tablet Take 1 tablet (20 mg total) by mouth 2 (two) times daily as needed for heartburn or indigestion. 04/29/18   Tonia Ghent, MD  Garlic 710 MG TABS Take 500 mg by mouth daily.    [provider]    Allergies    Amoxicillin, Gabapentin, Lyrica [pregabalin], Morphine sulfate,  and Sulfonamide derivatives  Review of Systems   Review of Systems  All other systems reviewed and are negative.   Physical Exam Updated Vital Signs BP 139/78   Pulse 62   Temp 98.7 F (37.1 C) (Oral)   Resp (!) 21   Ht 1.727 m (5\' 8" )   Wt 79.8 kg   SpO2 98%   BMI 26.76 kg/m   Physical Exam Vitals and nursing note reviewed.  Constitutional:      General: He is not in acute distress.    Appearance: Normal appearance. He is well-developed. He is not toxic-appearing.  HENT:     Head: Normocephalic and atraumatic.  Eyes:     General: Lids are normal.     Conjunctiva/sclera: Conjunctivae normal.     Pupils: Pupils are equal, round, and reactive to light.  Neck:     Thyroid: No thyroid mass.     Trachea: No tracheal deviation.  Cardiovascular:     Rate and Rhythm: Normal rate and regular rhythm.     Heart sounds: Normal heart sounds. No murmur heard.  No gallop.   Pulmonary:     Effort: Pulmonary effort is normal. No respiratory distress.     Breath sounds: Normal breath sounds. No stridor. No decreased breath sounds, wheezing, rhonchi or rales.  Abdominal:     General: Bowel sounds are normal. There is no distension.     Palpations: Abdomen is soft.     Tenderness: There is no abdominal tenderness. There is no rebound.  Musculoskeletal:        General: No tenderness. Normal range of motion.     Cervical back: Normal range of motion and neck supple.  Skin:    General: Skin is warm and dry.     Findings: No abrasion or rash.  Neurological:     Mental Status: He is alert and oriented to person, place, and time.     GCS: GCS eye subscore is 4. GCS verbal subscore is 5. GCS motor subscore is 6.     Cranial Nerves: No cranial nerve deficit.     Sensory: No sensory deficit.  Psychiatric:        Speech: Speech normal.        Behavior: Behavior normal.     ED Results / Procedures / Treatments   Labs (all labs ordered are listed, but only abnormal results are  displayed) Labs Reviewed  BASIC METABOLIC PANEL  CBC  TROPONIN I (HIGH SENSITIVITY)    EKG EKG Interpretation  Date/Time:  Thursday October 01 2019 11:54:48 EDT Ventricular Rate:  65 PR Interval:    QRS Duration: 128 QT Interval:  428 QTC Calculation: 445 R Axis:   -34 Text Interpretation: Sinus rhythm Right bundle branch block No significant change since last tracing Confirmed by Lacretia Leigh ([id number]) on 10/01/2019 12:42:07 PM  Radiology No results found.  Procedures Procedures (including critical care time)  Medications Ordered in ED Medications - No data to display  ED Course  I have reviewed the triage vital signs and the nursing notes.  Pertinent labs & imaging results that were available during my care of the patient were reviewed by me and considered in my medical decision making (see chart for details).    MDM Rules/Calculators/A&P                         Patient has heart score of 5  patient received aspirin prior to arrival.  First troponin negative here.  His EKG is without acute changes.  But given his concerning symptoms he will require inpatient admission. Final Clinical Impression(s) / ED Diagnoses Final diagnoses:  None    Rx / DC Orders ED Discharge Orders    None       Lacretia Leigh, MD 10/01/19 1432

## 2019-10-01 NOTE — H&P (Signed)
History and Physical    Billy Kim:[id number] DOB: 1945/11/17 DOA: 10/01/2019  PCP: Tonia Ghent, MD (Confirm with patient/family/NH records and if not entered, this has to be entered at [facility] point of entry) Patient coming from: Home  I have personally briefly reviewed patient's old medical records in [place]  Chief Complaint: Chest pain  HPI: Billy Kim is a 74 y.o. male with medical history significant of HTN, remote history of ulcerative colitis, presented with anginal-like chest pain.  First episode started about 1 week ago, in the morning while doing some exercise, 6-7 over 10, sharp like across the chest, associated with short of breath, took aspirin and sat down, and chest pain subsided in about 1 hour.  None chest pain came back almost every day associated with exertion, severity cardiac getting worse, this morning for the first time chest pain radiated to the left upper arm and the pontine patient came to hospital.  Patient had a normal cardiac cath in 2003 after similar chest pain. ED Course: Troponin II sets negative, EKG chronic RBBB without ST changes.  Review of Systems: As per HPI otherwise 14 point review of systems negative.   Past Medical History:  Diagnosis Date  . Hypertension   . Ulcerative colitis 5/81   Remission for years    Past Surgical History:  Procedure Laterality Date  . CARDIAC CATHETERIZATION  03/20/01   Cardiolite EF 55% 02/10/02  . CHOLECYSTECTOMY N/A 07/09/2017   Procedure: LAPAROSCOPIC CHOLECYSTECTOMY;  Surgeon: Judeth Horn, MD;  Location: [facility];  Service: General;  Laterality: N/A;  . COLONOSCOPY  multiple  . ENDOSCOPIC RETROGRADE CHOLANGIOPANCREATOGRAPHY (ERCP) WITH PROPOFOL N/A 07/08/2017   Procedure: ENDOSCOPIC RETROGRADE CHOLANGIOPANCREATOGRAPHY (ERCP) WITH PROPOFOL;  Surgeon: Jackquline Denmark, MD;  Location: [facility] ENDOSCOPY;  Service: Endoscopy;  Laterality: N/A;  . INGUINAL HERNIA REPAIR  04/09/06   Bilateral  . LAPAROSCOPIC  APPENDECTOMY  03/1981  . REMOVAL OF STONES  07/08/2017   Procedure: REMOVAL OF STONES;  Surgeon: Jackquline Denmark, MD;  Location: [facility] ENDOSCOPY;  Service: Endoscopy;;  . Joan Mayans  07/08/2017   Procedure: Joan Mayans;  Surgeon: Jackquline Denmark, MD;  Location: [facility] ENDOSCOPY;  Service: Endoscopy;;     reports that he has never smoked. He has never used smokeless tobacco. He reports that he does not drink alcohol and does not use drugs.  Allergies  Allergen Reactions  . Amoxicillin Other (See Comments)    Caused headache Has patient had a PCN reaction causing immediate rash, facial/tongue/throat swelling, SOB or lightheadedness with hypotension: No Has patient had a PCN reaction causing severe rash involving mucus membranes or skin necrosis: No Has patient had a PCN reaction that required hospitalization: No Has patient had a PCN reaction occurring within the last 10 years: No If all of the above answers are "NO", then may proceed with Cephalosporin use.  . Gabapentin Other (See Comments)    headache  . Lyrica [Pregabalin]     Intolerant, worsening headache  . Morphine Sulfate Other (See Comments)    headache  . Sulfonamide Derivatives Other (See Comments)    headache    Family History  Problem Relation Age of Onset  . Cancer Mother        ? GYN, died after hysterectomy  . Stroke Father   . Cancer Brother 70       Bladder  . Prostate cancer Brother   . Prostate cancer Brother   . Colon cancer Neg Hx   . Esophageal cancer  Neg Hx   . Rectal cancer Neg Hx   . Stomach cancer Neg Hx      Prior to Admission medications   Medication Sig Start Date End Date Taking? Authorizing Provider  Ascorbic Acid (VITAMIN C) 1000 MG tablet Take 1,000 mg by mouth daily.      [provider]  aspirin-acetaminophen-caffeine (EXCEDRIN EXTRA STRENGTH) [phone] MG per tablet Take 1 tablet by mouth daily as needed (pain). As needed     [provider]  carvedilol (COREG) 3.125 MG  tablet Take 1 tablet (3.125 mg total) by mouth 2 (two) times daily. 05/01/19   Tonia Ghent, MD  Coenzyme Q10 (COQ10) 100 MG capsule Take 100 mg by mouth daily.     [provider]  famotidine (PEPCID) 20 MG tablet Take 1 tablet (20 mg total) by mouth 2 (two) times daily as needed for heartburn or indigestion. 04/29/18   Tonia Ghent, MD  Garlic 275 MG TABS Take 500 mg by mouth daily.    [provider]    Physical Exam: Vitals:   10/01/19 1215 10/01/19 1330 10/01/19 1415 10/01/19 1516  BP: 139/78 120/71 123/73 135/77  Pulse: 62 (!) 56 (!) 58 (!) 59  Resp: (!) 21   18  Temp:      TempSrc:      SpO2: 98% 96% 97% 99%  Weight:      Height:        Constitutional: NAD, calm, comfortable Vitals:   10/01/19 1215 10/01/19 1330 10/01/19 1415 10/01/19 1516  BP: 139/78 120/71 123/73 135/77  Pulse: 62 (!) 56 (!) 58 (!) 59  Resp: (!) 21   18  Temp:      TempSrc:      SpO2: 98% 96% 97% 99%  Weight:      Height:       Eyes: PERRL, lids and conjunctivae normal ENMT: Mucous membranes are moist. Posterior pharynx clear of any exudate or lesions.Normal dentition.  Neck: normal, supple, no masses, no thyromegaly Respiratory: clear to auscultation bilaterally, no wheezing, no crackles. Normal respiratory effort. No accessory muscle use.  Cardiovascular: Regular rate and rhythm, no murmurs / rubs / gallops. No extremity edema. 2+ pedal pulses. No carotid bruits.  Abdomen: no tenderness, no masses palpated. No hepatosplenomegaly. Bowel sounds positive.  Musculoskeletal: no clubbing / cyanosis. No joint deformity upper and lower extremities. Good ROM, no contractures. Normal muscle tone.  Skin: no rashes, lesions, ulcers. No induration Neurologic: CN 2-12 grossly intact. Sensation intact, DTR normal. Strength 5/5 in all 4.  Psychiatric: Normal judgment and insight. Alert and oriented x 3. Normal mood.     Labs on Admission: I have personally reviewed following labs and  imaging studies  CBC: Recent Labs  Lab 10/01/19 1248  WBC 6.4  HGB 15.0  HCT 43.2  MCV 88.3  PLT 170*   Basic Metabolic Panel: Recent Labs  Lab 10/01/19 1248  NA 138  K 4.4  CL 105  CO2 24  GLUCOSE 95  BUN 20  CREATININE 1.33*  CALCIUM 8.4*   GFR: Estimated Creatinine Clearance: 46.4 mL/min (A) (by C-G formula based on SCr of 1.33 mg/dL (H)). Liver Function Tests: No results for input(s): AST, ALT, ALKPHOS, BILITOT, PROT, ALBUMIN in the last 168 hours. No results for input(s): LIPASE, AMYLASE in the last 168 hours. No results for input(s): AMMONIA in the last 168 hours. Coagulation Profile: No results for input(s): INR, PROTIME in the last 168 hours. Cardiac Enzymes: No  results for input(s): CKTOTAL, CKMB, CKMBINDEX, TROPONINI in the last 168 hours. BNP (last 3 results) No results for input(s): PROBNP in the last 8760 hours. HbA1C: No results for input(s): HGBA1C in the last 72 hours. CBG: No results for input(s): GLUCAP in the last 168 hours. Lipid Profile: No results for input(s): CHOL, HDL, LDLCALC, TRIG, CHOLHDL, LDLDIRECT in the last 72 hours. Thyroid Function Tests: No results for input(s): TSH, T4TOTAL, FREET4, T3FREE, THYROIDAB in the last 72 hours. Anemia Panel: No results for input(s): VITAMINB12, FOLATE, FERRITIN, TIBC, IRON, RETICCTPCT in the last 72 hours. Urine analysis:    Component Value Date/Time   COLORURINE YELLOW 07/05/2017 2145   APPEARANCEUR CLEAR 07/05/2017 2145   LABSPEC 1.012 07/05/2017 2145   PHURINE 7.0 07/05/2017 2145   GLUCOSEU NEGATIVE 07/05/2017 2145   HGBUR NEGATIVE 07/05/2017 2145   BILIRUBINUR NEGATIVE 07/05/2017 2145   BILIRUBINUR Neg 06/26/2017 1434   KETONESUR NEGATIVE 07/05/2017 2145   PROTEINUR NEGATIVE 07/05/2017 2145   UROBILINOGEN 0.2 06/26/2017 1434   NITRITE NEGATIVE 07/05/2017 2145   LEUKOCYTESUR NEGATIVE 07/05/2017 2145    Radiological Exams on Admission: DG Chest 2 View  Result Date: 10/01/2019 CLINICAL  DATA:  Chest pain EXAM: CHEST - 2 VIEW COMPARISON:  2019 FINDINGS: The heart size and mediastinal contours are within normal limits. Both lungs are clear. Area of nodularity at the left lung base on the prior study is not visualized. No pleural effusion or pneumothorax. The visualized skeletal structures are unremarkable. IMPRESSION: No acute process in the chest. Electronically Signed   By: Macy Mis M.D.   On: 10/01/2019 12:41    EKG: Independently reviewed.  Sinus rhythm, RBBB  Assessment/Plan Active Problems:   Chest pain  (please populate well all problems here in Problem List. (For example, if patient is on BP meds at home and you resume or decide to hold them, it is a problem that needs to be her. Same for CAD, COPD, HLD and so on)  Chest pain rule out ACS -Has some element of angina character, discussed with cardiology, ordered echo and stress test.  Reconsult cardiology if stress test turn positive -Check lipid panel  HTN -Continue Coreg  DVT prophylaxis: Lovenox Code Status: Full code Family Communication: None at bedside Disposition Plan: Expect discharge in 24 hours after cardiac work-up done Consults called: Curbside consultation with cardiology Admission status: Telemetry obs   Lequita Halt MD [facility] Pager [phone]  10/01/2019, 4:29 PM

## 2019-10-01 NOTE — ED Triage Notes (Signed)
Pt is here with intermittent chest pain for one week, reports SOB with exertion. Pt had radiation to left arm.  Pt has had 325mg  of ASA and one nitro and states pain is just slightly there.

## 2019-10-02 ENCOUNTER — Encounter: Payer: Self-pay | Admitting: Physician Assistant

## 2019-10-02 ENCOUNTER — Observation Stay (HOSPITAL_BASED_OUTPATIENT_CLINIC_OR_DEPARTMENT_OTHER): Payer: Managed Care, Other (non HMO)

## 2019-10-02 DIAGNOSIS — I10 Essential (primary) hypertension: Secondary | ICD-10-CM | POA: Diagnosis not present

## 2019-10-02 DIAGNOSIS — R079 Chest pain, unspecified: Secondary | ICD-10-CM

## 2019-10-02 LAB — ECHOCARDIOGRAM COMPLETE
Area-P 1/2: 2.69 cm2
Height: 68 in
S' Lateral: 3.1 cm
Weight: 2816 oz

## 2019-10-02 LAB — NM MYOCAR MULTI W/SPECT W/WALL MOTION / EF
Peak HR: 93 {beats}/min
Rest HR: 61 {beats}/min

## 2019-10-02 MED ORDER — AMINOPHYLLINE 25 MG/ML IV SOLN
INTRAVENOUS | Status: AC
  Filled 2019-10-02: qty 10

## 2019-10-02 MED ORDER — REGADENOSON 0.4 MG/5ML IV SOLN
INTRAVENOUS | Status: AC
  Filled 2019-10-02: qty 5

## 2019-10-02 MED ORDER — AMINOPHYLLINE 25 MG/ML IV (NUC MED)
75.0000 mg | Freq: Once | INTRAVENOUS | Status: AC
  Administered 2019-10-02: 75 mg via INTRAVENOUS

## 2019-10-02 MED ORDER — TECHNETIUM TC 99M TETROFOSMIN IV KIT
31.0000 | PACK | Freq: Once | INTRAVENOUS | Status: AC | PRN
  Administered 2019-10-02: 31 via INTRAVENOUS

## 2019-10-02 MED ORDER — REGADENOSON 0.4 MG/5ML IV SOLN
0.4000 mg | Freq: Once | INTRAVENOUS | Status: AC
  Administered 2019-10-02: 0.4 mg via INTRAVENOUS
  Filled 2019-10-02: qty 5

## 2019-10-02 MED ORDER — LOSARTAN POTASSIUM 25 MG PO TABS: 25.0000 mg | ORAL_TABLET | Freq: Every day | ORAL | Status: DC

## 2019-10-02 MED ORDER — LOSARTAN POTASSIUM 25 MG PO TABS
25.0000 mg | ORAL_TABLET | Freq: Every day | ORAL | Status: DC
  Administered 2019-10-02: 25 mg via ORAL
  Filled 2019-10-02: qty 1

## 2019-10-02 MED ORDER — TECHNETIUM TC 99M TETROFOSMIN IV KIT
10.3000 | PACK | Freq: Once | INTRAVENOUS | Status: AC | PRN
  Administered 2019-10-02: 10.3 via INTRAVENOUS

## 2019-10-02 MED ORDER — LOSARTAN POTASSIUM 25 MG PO TABS: 25.0000 mg | ORAL_TABLET | Freq: Every day | ORAL | 0 refills | Status: DC

## 2019-10-02 NOTE — Progress Notes (Signed)
  Echocardiogram 2D Echocardiogram has been performed.  Jennette Dubin 10/02/2019, 1:47 PM

## 2019-10-02 NOTE — Discharge Summary (Signed)
Physician Discharge Summary  Linton Stolp Garlitz QJJ:[id number] DOB: Jan 12, 1946 DOA: 10/01/2019  PCP: Tonia Ghent, MD  Admit date: 10/01/2019 Discharge date: 10/03/2019  Admitted From: home Discharge disposition: home   Recommendations for Outpatient Follow-Up:   1. Referral to GI 2. Added losartan for elevated BP-- BMP 1 week, if does not tolerate, could consider norvasc 3. PPI/H2 blocker   Discharge Diagnosis:   Active Problems:   HYPERTENSION, BENIGN ESSENTIAL   Chest pain    Discharge Condition: Improved.  Diet recommendation: Low sodium, heart healthy.   Wound care: None.  Code status: Full.   History of Present Illness:   Chadric Kimberley Shaker is a 74 y.o. male with medical history significant of HTN, remote history of ulcerative colitis, presented with anginal-like chest pain.  First episode started about 1 week ago, in the morning while doing some exercise, 6-7 over 10, sharp like across the chest, associated with short of breath, took aspirin and sat down, and chest pain subsided in about 1 hour.  None chest pain came back almost every day associated with exertion, severity cardiac getting worse, this morning for the first time chest pain radiated to the left upper arm and the pontine patient came to hospital.  Patient had a normal cardiac cath in 2003 after similar chest pain. ED Course: Troponin II sets negative, EKG chronic RBBB without ST changes.   Hospital Course by Problem:   Chest discomfort -echo with preserved EF and grade 2 diastolic dsfn -stress test low risk -CE negative -outpatient referral to cards and GI  HTN -Continue Coreg- unable to titrate due to HR -add losartan (has been on lisinopril in past but stopped due to cough)-- BMP 1-2 weeks     Medical Consultants:      Discharge Exam:   Vitals:   10/02/19 1713 10/02/19 1803  BP: (!) 151/86 (!) 150/79  Pulse: 70 66  Resp: 16 16  Temp: 98.5 F (36.9 C) (!) 97.1 F (36.2 C)    SpO2: 98% 100%   Vitals:   10/02/19 1055 10/02/19 1242 10/02/19 1713 10/02/19 1803  BP: (!) 179/101 (!) 163/86 (!) 151/86 (!) 150/79  Pulse: 70 64 70 66  Resp:  16 16 16   Temp:  98 F (36.7 C) 98.5 F (36.9 C) (!) 97.1 F (36.2 C)  TempSrc:  Oral Oral Oral  SpO2:  100% 98% 100%  Weight:      Height:        General exam: Appears calm and comfortable.   The results of significant diagnostics from this hospitalization (including imaging, microbiology, ancillary and laboratory) are listed below for reference.     Procedures and Diagnostic Studies:   DG Chest 2 View  Result Date: 10/01/2019 CLINICAL DATA:  Chest pain EXAM: CHEST - 2 VIEW COMPARISON:  2019 FINDINGS: The heart size and mediastinal contours are within normal limits. Both lungs are clear. Area of nodularity at the left lung base on the prior study is not visualized. No pleural effusion or pneumothorax. The visualized skeletal structures are unremarkable. IMPRESSION: No acute process in the chest. Electronically Signed   By: Macy Mis M.D.   On: 10/01/2019 12:41   NM Myocar Multi W/Spect W/Wall Motion / EF  Result Date: 10/02/2019 CLINICAL DATA:  Chest pain. EXAM: MYOCARDIAL IMAGING WITH SPECT (REST AND PHARMACOLOGIC-STRESS) GATED LEFT VENTRICULAR WALL MOTION STUDY LEFT VENTRICULAR EJECTION FRACTION TECHNIQUE: Standard myocardial SPECT imaging was performed after resting intravenous injection of 10 mCi  Tc-31m tetrofosmin. Subsequently, intravenous infusion of Lexiscan was performed under the supervision of the Cardiology staff. At peak effect of the drug, 30 mCi Tc-26m tetrofosmin was injected intravenously and standard myocardial SPECT imaging was performed. Quantitative gated imaging was also performed to evaluate left ventricular wall motion, and estimate left ventricular ejection fraction. COMPARISON:  None. FINDINGS: Perfusion: There is some probable diaphragmatic attenuation of the inferior wall on both rest and  stress studies affecting primarily the mid to basilar aspect of the inferior wall. There is no evidence of convincing inducible myocardial ischemia or scar. Wall Motion: Normal left ventricular wall motion. No left ventricular dilation. Left Ventricular Ejection Fraction: 58 % End diastolic volume 89 ml End systolic volume 37 ml IMPRESSION: 1. No evidence of inducible myocardial ischemia. 2. Normal left ventricular wall motion. 3. Left ventricular ejection fraction 58% 4. Non invasive risk stratification*: Low *2012 Appropriate Use Criteria for Coronary Revascularization Focused Update: J Am Coll Cardiol. 2952;84(1):324-401. http://content.airportbarriers.com.aspx?articleid=[email or web address] Electronically Signed   By: Aletta Edouard M.D.   On: 10/02/2019 16:54   ECHOCARDIOGRAM COMPLETE  Result Date: 10/02/2019    ECHOCARDIOGRAM REPORT   Patient Name:   MIKO SIRICO Huntsberry Date of Exam: 10/02/2019 Medical Rec #:  [id number]    Height:       68.0 in Accession #:    [id number]   Weight:       176.0 lb Date of Birth:  Jan 22, 1946     BSA:          1.936 m Patient Age:    74 years     BP:           163/86 mmHg Patient Gender: M            HR:           64 bpm. Exam Location:  Inpatient Procedure: 2D Echo Indications:    Chest pain R07.9  History:        Patient has no prior history of Echocardiogram examinations.                 Risk Factors:Hypertension.  Sonographer:    Mikki Santee RDCS (AE) Referring Phys: [id number] Lequita Halt IMPRESSIONS  1. Left ventricular ejection fraction, by estimation, is 60 to 65%. The left ventricle has normal function. The left ventricle has no regional wall motion abnormalities. Left ventricular diastolic parameters are consistent with Grade II diastolic dysfunction (pseudonormalization). Elevated left atrial pressure.  2. Right ventricular systolic function is normal. The right ventricular size is normal. There is normal pulmonary artery systolic pressure. The estimated right ventricular  systolic pressure is 33.2 mmHg.  3. The mitral valve is normal in structure. Trivial mitral valve regurgitation. No evidence of mitral stenosis.  4. The aortic valve is normal in structure. Aortic valve regurgitation is not visualized. No aortic stenosis is present.  5. The inferior vena cava is normal in size with greater than 50% respiratory variability, suggesting right atrial pressure of 3 mmHg. FINDINGS  Left Ventricle: Left ventricular ejection fraction, by estimation, is 60 to 65%. The left ventricle has normal function. The left ventricle has no regional wall motion abnormalities. The left ventricular internal cavity size was normal in size. There is  no left ventricular hypertrophy. Left ventricular diastolic parameters are consistent with Grade II diastolic dysfunction (pseudonormalization). Elevated left atrial pressure. Right Ventricle: The right ventricular size is normal. No increase in right ventricular wall thickness. Right ventricular systolic function is normal. There is normal pulmonary artery  systolic pressure. The tricuspid regurgitant velocity is 1.98 m/s, and  with an assumed right atrial pressure of 3 mmHg, the estimated right ventricular systolic pressure is 54.6 mmHg. Left Atrium: Left atrial size was normal in size. Right Atrium: Right atrial size was normal in size. Pericardium: There is no evidence of pericardial effusion. Mitral Valve: The mitral valve is normal in structure. Normal mobility of the mitral valve leaflets. Trivial mitral valve regurgitation. No evidence of mitral valve stenosis. Tricuspid Valve: The tricuspid valve is normal in structure. Tricuspid valve regurgitation is not demonstrated. No evidence of tricuspid stenosis. Aortic Valve: The aortic valve is normal in structure. Aortic valve regurgitation is not visualized. No aortic stenosis is present. Pulmonic Valve: The pulmonic valve was not well visualized. Pulmonic valve regurgitation is not visualized. No evidence  of pulmonic stenosis. Aorta: The aortic root is normal in size and structure. Venous: The inferior vena cava is normal in size with greater than 50% respiratory variability, suggesting right atrial pressure of 3 mmHg. IAS/Shunts: No atrial level shunt detected by color flow Doppler.  LEFT VENTRICLE PLAX 2D LVIDd:         4.30 cm  Diastology LVIDs:         3.10 cm  LV e' lateral:   8.59 cm/s LV PW:         1.00 cm  LV E/e' lateral: 10.0 LV IVS:        0.90 cm  LV e' medial:    5.00 cm/s LVOT diam:     2.40 cm  LV E/e' medial:  17.1 LV SV:         124 LV SV Index:   64 LVOT Area:     4.52 cm  RIGHT VENTRICLE RV S prime:     10.60 cm/s TAPSE (M-mode): 1.4 cm LEFT ATRIUM             Index       RIGHT ATRIUM           Index LA diam:        3.30 cm 1.70 cm/m  RA Area:     17.20 cm LA Vol (A2C):   49.9 ml 25.78 ml/m RA Volume:   41.30 ml  21.34 ml/m LA Vol (A4C):   53.0 ml 27.38 ml/m LA Biplane Vol: 54.0 ml 27.90 ml/m  AORTIC VALVE LVOT Vmax:   107.00 cm/s LVOT Vmean:  77.200 cm/s LVOT VTI:    0.274 m  AORTA Ao Root diam: 3.60 cm MITRAL VALVE               TRICUSPID VALVE MV Area (PHT): 2.69 cm    TR Peak grad:   15.7 mmHg MV Decel Time: 282 msec    TR Vmax:        198.00 cm/s MV E velocity: 85.70 cm/s MV A velocity: 98.50 cm/s  SHUNTS MV E/A ratio:  0.87        Systemic VTI:  0.27 m                            Systemic Diam: 2.40 cm Dani Gobble Croitoru MD Electronically signed by Sanda Klein MD Signature Date/Time: 10/02/2019/2:41:38 PM    Final      Labs:   Basic Metabolic Panel: Recent Labs  Lab 10/01/19 1248  NA 138  K 4.4  CL 105  CO2 24  GLUCOSE 95  BUN 20  CREATININE 1.33*  CALCIUM  8.4*   GFR Estimated Creatinine Clearance: 46.4 mL/min (A) (by C-G formula based on SCr of 1.33 mg/dL (H)). Liver Function Tests: No results for input(s): AST, ALT, ALKPHOS, BILITOT, PROT, ALBUMIN in the last 168 hours. No results for input(s): LIPASE, AMYLASE in the last 168 hours. No results for input(s):  AMMONIA in the last 168 hours. Coagulation profile No results for input(s): INR, PROTIME in the last 168 hours.  CBC: Recent Labs  Lab 10/01/19 1248  WBC 6.4  HGB 15.0  HCT 43.2  MCV 88.3  PLT 116*   Cardiac Enzymes: No results for input(s): CKTOTAL, CKMB, CKMBINDEX, TROPONINI in the last 168 hours. BNP: Invalid input(s): POCBNP CBG: No results for input(s): GLUCAP in the last 168 hours. D-Dimer No results for input(s): DDIMER in the last 72 hours. Hgb A1c No results for input(s): HGBA1C in the last 72 hours. Lipid Profile Recent Labs    10/01/19 1622  CHOL 124  HDL 23*  LDLCALC 56  TRIG 224*  CHOLHDL 5.4   Thyroid function studies No results for input(s): TSH, T4TOTAL, T3FREE, THYROIDAB in the last 72 hours.  Invalid input(s): FREET3 Anemia work up No results for input(s): VITAMINB12, FOLATE, FERRITIN, TIBC, IRON, RETICCTPCT in the last 72 hours. Microbiology Recent Results (from the past 240 hour(s))  SARS Coronavirus 2 by RT PCR (hospital order, performed in [facility] hospital lab) Nasopharyngeal Nasopharyngeal Swab     Status: None   Collection Time: 10/01/19  2:26 PM   Specimen: Nasopharyngeal Swab  Result Value Ref Range Status   SARS Coronavirus 2 NEGATIVE NEGATIVE Final    Comment: (NOTE) SARS-CoV-2 target nucleic acids are NOT DETECTED.  The SARS-CoV-2 RNA is generally detectable in upper and lower respiratory specimens during the acute phase of infection. The lowest concentration of SARS-CoV-2 viral copies this assay can detect is 250 copies / mL. A negative result does not preclude SARS-CoV-2 infection and should not be used as the sole basis for treatment or other patient management decisions.  A negative result may occur with improper specimen collection / handling, submission of specimen other than nasopharyngeal swab, presence of viral mutation(s) within the areas targeted by this assay, and inadequate number of viral copies (<250 copies /  mL). A negative result must be combined with clinical observations, patient history, and epidemiological information.  Fact Sheet for Patients:   StrictlyIdeas.no  Fact Sheet for Healthcare Providers: BankingDealers.co.za  This test is not yet approved or  cleared by the [place] FDA and has been authorized for detection and/or diagnosis of SARS-CoV-2 by FDA under an Emergency Use Authorization (EUA).  This EUA will remain in effect (meaning this test can be used) for the duration of the COVID-19 declaration under Section 564(b)(1) of the Act, 21 U.S.C. section 360bbb-3(b)(1), unless the authorization is terminated or revoked sooner.  Performed at [facility], [place] [address] [place], [place] [address]      Discharge Instructions:   Discharge Instructions    Diet - low sodium heart healthy   Complete by: As directed    Discharge instructions   Complete by: As directed    Would recommend GI follow up for consideration of EGD Will need titration of BP medications  BMP 1 week Referral to cardiology (Dr. Saunders Revel in [place] or [facility] in [place]- Dr. Achyra/O'Neal/Christopher) Keep log of blood pressures and bring to PCP AVOID NSAIDS   Increase activity slowly   Complete by: As directed      Allergies  as of 10/02/2019      Reactions   Amoxicillin Other (See Comments)   Caused headache Has patient had a PCN reaction causing immediate rash, facial/tongue/throat swelling, SOB or lightheadedness with hypotension: No Has patient had a PCN reaction causing severe rash involving mucus membranes or skin necrosis: No Has patient had a PCN reaction that required hospitalization: No Has patient had a PCN reaction occurring within the last 10 years: No If all of the above answers are "NO", then may proceed with Cephalosporin use.   Gabapentin Other (See Comments)   headache   Lyrica [pregabalin]    Intolerant, worsening  headache   Morphine Sulfate Other (See Comments)   headache   Sulfonamide Derivatives Other (See Comments)   headache      Medication List    TAKE these medications   carvedilol 3.125 MG tablet Commonly known as: COREG Take 1 tablet (3.125 mg total) by mouth 2 (two) times daily.   Coenzyme Q10 100 MG capsule Take 100 mg by mouth daily.   famotidine 20 MG tablet Commonly known as: Pepcid Take 1 tablet (20 mg total) by mouth 2 (two) times daily as needed for heartburn or indigestion. What changed: when to take this   Garlic 456 MG Tabs Take 500 mg by mouth daily.   losartan 25 MG tablet Commonly known as: COZAAR Take 1 tablet (25 mg total) by mouth daily.   vitamin C 1000 MG tablet Take 1,000 mg by mouth daily.       Follow-up Information    Tonia Ghent, MD Follow up in 1 week(s).   Specialty: Family Medicine Contact information: [place] [address] [phone]        Isaac Bliss, Rayford Halsted, MD Follow up.   Specialty: Internal Medicine Why: if you need a new PCP Contact information: [place] [address] [phone]                Time coordinating discharge: 25 min  Signed:  Geradine Girt DO  [facility] 10/03/2019, 3:08 PM

## 2019-10-02 NOTE — Progress Notes (Addendum)
   Billy Kim presented for a nuclear stress test today. Ordered by IM.  No immediate complications. Did feel chest tightness/dyspnea with Lexiscan so aminophylline administered after 8 minute mark with improvement in symptoms. (Presence of symptoms does not necessarily infer information about outcome of test.)  Stress imaging is pending at this time.  Preliminary EKG findings may be listed in the chart, but the stress test result will not be finalized until perfusion imaging is complete.  Cardiology is not currently involved in patient's care otherwise so primary medical team will be responsible for follow-up of result. Nuclear stress test results are usually read by the mid afternoon. His study is assigned to [facility] Radiology to read. Please call if any needs arise.  Charlie Pitter, PA-C 10/02/2019, 10:33 AM

## 2019-10-27 ENCOUNTER — Other Ambulatory Visit: Payer: Self-pay

## 2019-10-27 DIAGNOSIS — I10 Essential (primary) hypertension: Secondary | ICD-10-CM

## 2019-10-27 NOTE — Telephone Encounter (Signed)
Patient contacted the office and states that he needs a refill on Losartan.  Last refilled 10/02/19 for #30 with 0 refills. Ok to refill?

## 2019-10-28 ENCOUNTER — Other Ambulatory Visit: Payer: Self-pay | Admitting: Family Medicine

## 2019-10-28 MED ORDER — LOSARTAN POTASSIUM 25 MG PO TABS: 25.0000 mg | ORAL_TABLET | Freq: Every day | ORAL | 1 refills | Status: DC

## 2019-10-28 NOTE — Telephone Encounter (Signed)
Rx sent.  Needs office visit with nonfasting labs done at the visit.  I put in the order for bmet.  Thanks.

## 2019-10-29 NOTE — Telephone Encounter (Signed)
Patient advised. Appointment scheduled.  

## 2019-11-26 ENCOUNTER — Other Ambulatory Visit: Payer: Self-pay | Admitting: Family Medicine

## 2019-11-26 ENCOUNTER — Other Ambulatory Visit: Payer: Self-pay | Admitting: *Deleted

## 2019-11-30 ENCOUNTER — Ambulatory Visit: Payer: Managed Care, Other (non HMO) | Admitting: Family Medicine

## 2019-12-08 ENCOUNTER — Other Ambulatory Visit: Payer: Self-pay

## 2019-12-08 ENCOUNTER — Ambulatory Visit (INDEPENDENT_AMBULATORY_CARE_PROVIDER_SITE_OTHER): Payer: Managed Care, Other (non HMO) | Admitting: Family Medicine

## 2019-12-08 ENCOUNTER — Encounter: Payer: Self-pay | Admitting: Family Medicine

## 2019-12-08 VITALS — BP 132/84 | HR 62 | Temp 96.9°F | Ht 68.0 in | Wt 177.1 lb

## 2019-12-08 DIAGNOSIS — Z23 Encounter for immunization: Secondary | ICD-10-CM

## 2019-12-08 DIAGNOSIS — I10 Essential (primary) hypertension: Secondary | ICD-10-CM

## 2019-12-08 DIAGNOSIS — R079 Chest pain, unspecified: Secondary | ICD-10-CM | POA: Diagnosis not present

## 2019-12-08 MED ORDER — LOSARTAN POTASSIUM 25 MG PO TABS
ORAL_TABLET | ORAL | 1 refills | Status: DC
Start: 2019-12-08 — End: 2020-05-30

## 2019-12-08 NOTE — Patient Instructions (Addendum)
Go to the lab on the way out.   If you have mychart we'll likely use that to update you.    We'll call about seeing cardiology.  Limit exertion.  If you have any more chest pain, then go to the ER.  Take care.  Glad to see you.

## 2019-12-08 NOTE — Progress Notes (Signed)
This visit occurred during the SARS-CoV-2 public health emergency.  Safety protocols were in place, including screening questions prior to the visit, additional usage of staff PPE, and extensive cleaning of exam room while observing appropriate contact time as indicated for disinfecting solutions.  Shingles pain is better.  Some residual itching locally.  Recheck Cr pending.  No nsaids.  Prev Cr d/w pt.    Hypertension:    Using medication without problems or lightheadedness:  yes Chest pain with exertion: some upper chest pain with exertion.   Edema:no Short of breath:mild, with exertion  He had exertional sx with carrying a load.  Better with rest.   No CP now.   ================================= Prev inpatient course: Chest discomfort -echo with preserved EF and grade 2 diastolic dsfn -stress test low risk -CE negative -outpatient referral to cards and GI  HTN -Continue Coreg- unable to titrate due to HR -add losartan (has been on lisinopril in past but stopped due to cough)-- BMP 1-2 weeks ================================= Meds, vitals, and allergies reviewed.  ROS: Per HPI unless specifically indicated in ROS section   GEN: nad, alert and oriented HEENT: ncat NECK: supple w/o LA CV: rrr. PULM: ctab, no inc wob ABD: soft, +bs EXT: no edema SKIN: no acute rash

## 2019-12-08 NOTE — Addendum Note (Signed)
Addended by: Ellamae Sia on: 12/08/2019 10:10 AM   Modules accepted: Orders

## 2019-12-09 ENCOUNTER — Ambulatory Visit (INDEPENDENT_AMBULATORY_CARE_PROVIDER_SITE_OTHER): Payer: Managed Care, Other (non HMO) | Admitting: Internal Medicine

## 2019-12-09 ENCOUNTER — Other Ambulatory Visit
Admission: RE | Admit: 2019-12-09 | Discharge: 2019-12-09 | Disposition: A | Discharge status: Home / Self Care | Payer: Managed Care, Other (non HMO) | Source: Ambulatory Visit | Attending: Internal Medicine | Admitting: Internal Medicine

## 2019-12-09 ENCOUNTER — Encounter: Payer: Self-pay | Admitting: Internal Medicine

## 2019-12-09 VITALS — BP 150/92 | HR 60 | Ht 68.0 in | Wt 176.0 lb

## 2019-12-09 DIAGNOSIS — Z20822 Contact with and (suspected) exposure to covid-19: Secondary | ICD-10-CM | POA: Diagnosis not present

## 2019-12-09 DIAGNOSIS — Z01818 Encounter for other preprocedural examination: Secondary | ICD-10-CM | POA: Diagnosis not present

## 2019-12-09 DIAGNOSIS — I10 Essential (primary) hypertension: Secondary | ICD-10-CM

## 2019-12-09 DIAGNOSIS — R079 Chest pain, unspecified: Secondary | ICD-10-CM

## 2019-12-09 LAB — CBC WITH DIFFERENTIAL/PLATELET
Basophils Absolute: 0 10*3/uL (ref 0.0–0.2)
Basos: 1 %
EOS (ABSOLUTE): 0.3 10*3/uL (ref 0.0–0.4)
Eos: 5 %
Hematocrit: 44.8 % (ref 37.5–51.0)
Hemoglobin: 16.1 g/dL (ref 13.0–17.7)
Immature Grans (Abs): 0 10*3/uL (ref 0.0–0.1)
Immature Granulocytes: 0 %
Lymphocytes Absolute: 1.1 10*3/uL (ref 0.7–3.1)
Lymphs: 18 %
MCH: 30.8 pg (ref 26.6–33.0)
MCHC: 35.9 g/dL — ABNORMAL HIGH (ref 31.5–35.7)
MCV: 86 fL (ref 79–97)
Monocytes Absolute: 0.6 10*3/uL (ref 0.1–0.9)
Monocytes: 9 %
Neutrophils Absolute: 4.2 10*3/uL (ref 1.4–7.0)
Neutrophils: 67 %
Platelets: 131 10*3/uL — ABNORMAL LOW (ref 150–450)
RBC: 5.23 x10E6/uL (ref 4.14–5.80)
RDW: 13.3 % (ref 11.6–15.4)
WBC: 6.2 10*3/uL (ref 3.4–10.8)

## 2019-12-09 LAB — BASIC METABOLIC PANEL
BUN/Creatinine Ratio: 11 (ref 10–24)
BUN: 13 mg/dL (ref 8–27)
CO2: 26 mmol/L (ref 20–29)
Calcium: 8.9 mg/dL (ref 8.6–10.2)
Chloride: 106 mmol/L (ref 96–106)
Creatinine, Ser: 1.14 mg/dL (ref 0.76–1.27)
GFR calc Af Amer: 72 mL/min/{1.73_m2} (ref >59)
GFR calc non Af Amer: 63 mL/min/{1.73_m2} (ref >59)
Glucose: 95 mg/dL (ref 65–99)
Potassium: 4.7 mmol/L (ref 3.5–5.2)
Sodium: 144 mmol/L (ref 134–144)

## 2019-12-09 MED ORDER — ASPIRIN EC 81 MG PO TBEC: 81.0000 mg | DELAYED_RELEASE_TABLET | Freq: Every day | ORAL | 3 refills | Status: DC

## 2019-12-09 MED ORDER — ISOSORBIDE MONONITRATE ER 30 MG PO TB24: 15.0000 mg | ORAL_TABLET | Freq: Every day | ORAL | 1 refills | Status: DC

## 2019-12-09 MED ORDER — NITROGLYCERIN 0.4 MG SL SUBL: 0.4000 mg | SUBLINGUAL_TABLET | SUBLINGUAL | 2 refills | Status: DC | PRN

## 2019-12-09 NOTE — H&P (View-Only) (Signed)
New Outpatient Visit Date: 12/09/2019  Referring Provider: Tonia Ghent, MD [place],  [place] [address]  Chief Complaint: Chest pain  HPI:  Billy Kim is a 74 y.o. male who is being seen today for the evaluation of chest pain at the request of Dr. Damita Dunnings. He has a history of hypertension and ulcerative colitis.  He was seen by Dr. Damita Dunnings yesterday and mention exertional chest pain when carrying heavy objects.  He was hospitalized in August with chest pain.  Work-up at that time including pharmacologic myocardial perfusion stress test and echocardiogram, was unremarkable other than grade 2 diastolic dysfunction with elevated left atrial pressure.  Billy Kim reports that he first developed chest tightness in May and June as he was in the process of moving.  When carrying heavy boxes are taking out his trash, he would notice pressure across his chest that resolved with rest.  However, this gradually progressed over the summer to the point where he would have a vague discomfort in his chest all the time.  This has been present since August and led to the aforementioned ED visit and hospitalization in August.  Since then, his mild vague chest discomfort at rest has not changed significantly.  However, he finds that less exertion now brings on the discomfort.  There are no associated symptoms, denying shortness of breath, palpitations, lightheadedness, diaphoresis, and nausea.  Billy Kim has not had any edema.  He occasionally takes Excedrin for his chest pain and feels like it helps.  He also notes that the sublingual nitroglycerin that he received during his hospitalization in August resolved the pain.  Billy Kim reports having undergone a stress test in 2003 for similar discomfort.  He believes it was normal.  Our records reveal a cardiac catheterization in 2003 that showed mild ectasia in the distal LCx but otherwise no significant coronary artery disease.  LVEF was normal.  He  reports that his home blood pressures are frequently elevated even beyond his pressure in the office today.  --------------------------------------------------------------------------------------------------  Cardiovascular History & Procedures: Cardiovascular Problems:  Chest pain  Risk Factors:  Hypertension, male gender, and age greater than 29  Cath/PCI:  LHC (03/20/2001): Mild ectasia of the distal LCx.  Otherwise, no significant coronary artery disease.  Normal LVEF.  CV Surgery:  None  EP Procedures and Devices:  None  Non-Invasive Evaluation(s):  Pharmacologic MPI (10/02/2019): Low risk study without evidence of ischemia or scar.  LVEF 58%.  TTE (10/02/2019): Normal LV size and wall thickness.  LVEF 60-65% with grade 2 diastolic dysfunction and elevated filling pressure.  Normal RV size and function.  Normal PA pressure.  Trivial mitral regurgitation.  Recent CV Pertinent Labs: Lab Results  Component Value Date   CHOL 124 10/01/2019   CHOL 144 04/29/2019   CHOL 144 04/29/2019   HDL 23 (L) 10/01/2019   HDL 33 (L) 04/29/2019   LDLCALC 56 10/01/2019   LDLCALC 76 04/29/2019   LDLCALC 76 04/29/2019   LDLDIRECT 64.2 12/29/2008   TRIG 224 (H) 10/01/2019   TRIG 207 (A) 04/29/2019   CHOLHDL 5.4 10/01/2019   INR 1.22 06/13/2016   K 4.7 12/08/2019   BUN 13 12/08/2019   CREATININE 1.14 12/08/2019   CREATININE 1.07 04/29/2019    --------------------------------------------------------------------------------------------------  Past Medical History:  Diagnosis Date  . Hypertension   . Ulcerative colitis 5/81   Remission for years    Past Surgical History:  Procedure Laterality Date  . CARDIAC CATHETERIZATION  03/20/01   Cardiolite EF 55% 02/10/02  . CHOLECYSTECTOMY N/A 07/09/2017   Procedure: LAPAROSCOPIC CHOLECYSTECTOMY;  Surgeon: Judeth Horn, MD;  Location: [place];  Service: General;  Laterality: N/A;  . COLONOSCOPY  multiple  . ENDOSCOPIC RETROGRADE  CHOLANGIOPANCREATOGRAPHY (ERCP) WITH PROPOFOL N/A 07/08/2017   Procedure: ENDOSCOPIC RETROGRADE CHOLANGIOPANCREATOGRAPHY (ERCP) WITH PROPOFOL;  Surgeon: Jackquline Denmark, MD;  Location: [facility] ENDOSCOPY;  Service: Endoscopy;  Laterality: N/A;  . INGUINAL HERNIA REPAIR  04/09/06   Bilateral  . LAPAROSCOPIC APPENDECTOMY  03/1981  . REMOVAL OF STONES  07/08/2017   Procedure: REMOVAL OF STONES;  Surgeon: Jackquline Denmark, MD;  Location: [facility] ENDOSCOPY;  Service: Endoscopy;;  . Joan Mayans  07/08/2017   Procedure: Joan Mayans;  Surgeon: Jackquline Denmark, MD;  Location: [facility] ENDOSCOPY;  Service: Endoscopy;;    Current Meds  Medication Sig  . Ascorbic Acid (VITAMIN C) 1000 MG tablet Take 1,000 mg by mouth daily.    . carvedilol (COREG) 3.125 MG tablet Take 1 tablet (3.125 mg total) by mouth 2 (two) times daily.  . cholecalciferol (VITAMIN D3) 25 MCG (1000 UNIT) tablet Take 2,000 Units by mouth daily.   . Coenzyme Q10 (COQ10) 100 MG capsule Take 100 mg by mouth daily.   . famotidine (PEPCID) 20 MG tablet Take 1 tablet (20 mg total) by mouth 2 (two) times daily as needed for heartburn or indigestion.  . Garlic 161 MG TABS Take 500 mg by mouth daily.  Marland Kitchen losartan (COZAAR) 25 MG tablet TAKE 1 TABLET(25 MG) BY MOUTH DAILY    Allergies: Amoxicillin, Gabapentin, Lyrica [pregabalin], Morphine sulfate, and Sulfonamide derivatives  Social History   Tobacco Use  . Smoking status: Never Smoker  . Smokeless tobacco: Never Used  Substance Use Topics  . Alcohol use: No  . Drug use: No    Family History  Problem Relation Age of Onset  . Cancer Mother        ? GYN, died after hysterectomy  . Stroke Father   . CAD Father   . Cancer Brother 70       Bladder  . Prostate cancer Brother   . CAD Brother 35       CABG  . Prostate cancer Brother   . Colon cancer Neg Hx   . Esophageal cancer Neg Hx   . Rectal cancer Neg Hx   . Stomach cancer Neg Hx     Review of Systems: A 12-system review of systems was  performed and was negative except as noted in the HPI.  --------------------------------------------------------------------------------------------------  Physical Exam: BP (!) 150/92 (BP Location: Right Arm, Patient Position: Sitting, Cuff Size: Normal)   Pulse 60   Ht 5\' 8"  (1.727 m)   Wt 176 lb (79.8 kg)   SpO2 98%   BMI 26.76 kg/m   General: NAD. HEENT: No conjunctival pallor or scleral icterus. Facemask in place. Neck: Supple without lymphadenopathy, thyromegaly, JVD, or HJR. No carotid bruit. Lungs: Normal work of breathing. Clear to auscultation bilaterally without wheezes or crackles. Heart: Regular rate and rhythm without murmurs, rubs, or gallops. Non-displaced PMI. Abd: Bowel sounds present. Soft, NT/ND without hepatosplenomegaly Ext: No lower extremity edema. Radial, PT, and DP pulses are 2+ bilaterally Skin: Warm and dry without rash. Neuro: CNIII-XII intact. Strength and fine-touch sensation intact in upper and lower extremities bilaterally. Psych: Normal mood and affect.  EKG: Normal sinus rhythm with right bundle branch block.  No significant change from prior tracing on 10/01/2019.  Lab Results  Component Value Date  WBC 6.2 12/08/2019   HGB 16.1 12/08/2019   HCT 44.8 12/08/2019   MCV 86 12/08/2019   PLT 131 (L) 12/08/2019    Lab Results  Component Value Date   NA 144 12/08/2019   K 4.7 12/08/2019   CL 106 12/08/2019   CO2 26 12/08/2019   BUN 13 12/08/2019   CREATININE 1.14 12/08/2019   GLUCOSE 95 12/08/2019   ALT 16 04/29/2019    Lab Results  Component Value Date   CHOL 124 10/01/2019   HDL 23 (L) 10/01/2019   LDLCALC 56 10/01/2019   LDLDIRECT 64.2 12/29/2008   TRIG 224 (H) 10/01/2019   CHOLHDL 5.4 10/01/2019     --------------------------------------------------------------------------------------------------  ASSESSMENT AND PLAN: Chest pain: Billy Kim reports 4 to 5 months of exertional chest pain that is concerning for angina.  He  also notes persistent vague chest discomfort at rest since early August.  ED visit at that time with subsequent hospitalization was unrevealing with low risk stress test and normal echocardiogram other than diastolic dysfunction.  Billy Kim continues to have worsening exertional chest pain concerning for CCS class III-IV angina.  I have personally reviewed his myocardial perfusion stress test images which show a fixed basal and mid inferior defect most consistent with diaphragmatic attenuation.  There appears to be subtle hypoperfusion in the anterior and anterolateral walls on the stress images that may be artifactual though ischemia is certainly a concern.  I have recommended that we proceed with cardiac catheterization and possible PCI as soon as possible, which Billy Kim is in agreement with.  Catheterization is scheduled for 12/11/2019.  I have reviewed the risks, indications, and alternatives to cardiac catheterization, possible angioplasty, and stenting with the patient. Risks include but are not limited to bleeding, infection, vascular injury, stroke, myocardial infection, arrhythmia, kidney injury, radiation-related injury in the case of prolonged fluoroscopy use, emergency cardiac surgery, and death. The patient understands the risks of serious complication is 1-2 in [id number] with diagnostic cardiac cath and 1-2% or less with angioplasty/stenting.  In the meantime, will I have asked Billy Kim to begin taking aspirin 81 mg daily and isosorbide mononitrate 15 mg daily.  I have also provided him with a prescription for sublingual nitroglycerin to be used as needed for chest pain.  I advised him to call 911 if he has any worsening in his baseline symptoms.  Hypertension: Blood pressure suboptimally controlled today and often higher at home.  As above, we will add isosorbide mononitrate 15 mg daily.  Further titration of medications may be necessary based on results of catheterization.  Follow-up: Return to  clinic in 1 to 2 weeks after catheterization. Nelva Bush, MD 12/09/2019 1:39 PM

## 2019-12-09 NOTE — Patient Instructions (Signed)
Medication Instructions:  Your physician has recommended you make the following change in your medication:  1- START Aspirin 81 mg by mouth once a day. 2- START Imdur 15 mg (0.5 tablet) by mouth once a day. 3- Nitroglycerin as needed for chest pain - Dissolve 1 tablet (0.4 mg) under your tongue every 5 minutes as needed for chest pain. Do not take more than 3 doses. If chest pain does not resolve, then call 911 or go to the Emergency Room.   *If you need a refill on your cardiac medications before your next appointment, please call your pharmacy*  Lab Work: COVID PRE- TEST: You will need a COVID TEST prior to the procedure:  LOCATION: [facility].  DATE/TIME:  TODAY, 12/09/19 as you leave. You will need to call the number on the door when you get there and someone will come out to do the test.  If you have labs (blood work) drawn today and your tests are completely normal, you will receive your results only by: Marland Kitchen MyChart Message (if you have MyChart) OR . A paper copy in the mail If you have any lab test that is abnormal or we need to change your treatment, we will call you to review the results.   Testing/Procedures:  You are scheduled for a left  Cardiac Catheterization on Friday, October 22 with Dr. Harrell Gave End.  1. Please arrive at the [facility] at 6:30 AM (This time is one hour before your procedure to ensure your preparation). Free valet parking service is available.   Special note: Every effort is made to have your procedure done on time. Please understand that emergencies sometimes delay scheduled procedures.  2. Diet: Do not eat solid foods after midnight.  The patient may have clear liquids until 5am upon the day of the procedure.  3. Labs: DONE  4. Medication instructions in preparation for your procedure:   Contrast Allergy: No  On the morning of your procedure, take your Aspirin and any morning medicines NOT listed above.   You may use sips of water.  5. Plan for one night stay--bring personal belongings. 6. Bring a current list of your medications and current insurance cards. 7. You MUST have a responsible person to drive you home. 8. Someone MUST be with you the first 24 hours after you arrive home or your discharge will be delayed. 9. Please wear clothes that are easy to get on and off and wear slip-on shoes.  Thank you for allowing [place] to care for you!   -- [facility]    Follow-Up: At [facility], you and your health needs are our priority.  As part of our continuing mission to provide you with exceptional heart care, we have created designated Provider Care Teams.  These Care Teams include your primary Cardiologist (physician) and Advanced Practice Providers (APPs -  Physician Assistants and Nurse Practitioners) who all work together to provide you with the care you need, when you need it.  We recommend signing up for the patient portal called "MyChart".  Sign up information is provided on this After Visit Summary.  MyChart is used to connect with patients for Virtual Visits (Telemedicine).  Patients are able to view lab/test results, encounter notes, upcoming appointments, etc.  Non-urgent messages can be sent to your provider as well.   To learn more about what you can do with MyChart, go to [email or web address].    Your next appointment:  1-2 week(s) after cath  The format for your next appointment:   In Person  Provider:   You may see DR Harrell Gave END or one of the following Advanced Practice Providers on your designated Care Team:    Murray Hodgkins, NP  Christell Faith, PA-C  Marrianne Mood, PA-C  Cadence Kathlen Mody, [place]     Coronary Angiogram With Stent Coronary angiogram with stent placement is a procedure to widen or open a narrow blood vessel of the heart (coronary artery). Arteries may become blocked by cholesterol buildup (plaques) in the  lining of the artery wall. When a coronary artery becomes partially blocked, blood flow to that area decreases. This may lead to chest pain or a heart attack (myocardial infarction). A stent is a small piece of metal that looks like mesh or spring. Stent placement may be done as treatment after a heart attack, or to prevent a heart attack if a blocked artery is found by a coronary angiogram. Let your health care provider know about:  Any allergies you have, including allergies to medicines or contrast dye.  All medicines you are taking, including vitamins, herbs, eye drops, creams, and over-the-counter medicines.  Any problems you or family members have had with anesthetic medicines.  Any blood disorders you have.  Any surgeries you have had.  Any medical conditions you have, including kidney problems or kidney failure.  Whether you are pregnant or may be pregnant.  Whether you are breastfeeding. What are the risks? Generally, this is a safe procedure. However, serious problems may occur, including:  Damage to nearby structures or organs, such as the heart, blood vessels, or kidneys.  A return of blockage.  Bleeding, infection, or bruising at the insertion site.  A collection of blood under the skin (hematoma) at the insertion site.  A blood clot in another part of the body.  Allergic reaction to medicines or dyes.  Bleeding into the abdomen (retroperitoneal bleeding).  Stroke (rare).  Heart attack (rare). What happens before the procedure? Staying hydrated Follow instructions from your health care provider about hydration, which may include:  Up to 2 hours before the procedure - you may continue to drink clear liquids, such as water, clear fruit juice, black coffee, and plain tea.  Eating and drinking restrictions Follow instructions from your health care provider about eating and drinking, which may include:  8 hours before the procedure - stop eating heavy meals or  foods, such as meat, fried foods, or fatty foods.  6 hours before the procedure - stop eating light meals or foods, such as toast or cereal.  2 hours before the procedure - stop drinking clear liquids. Medicines Ask your health care provider about:  Changing or stopping your regular medicines. This is especially important if you are taking diabetes medicines or blood thinners.  Taking medicines such as aspirin and ibuprofen. These medicines can thin your blood. Do not take these medicines unless your health care provider tells you to take them. ? Generally, aspirin is recommended before a thin tube, called a catheter, is passed through a blood vessel and inserted into the heart (cardiac catheterization).  Taking over-the-counter medicines, vitamins, herbs, and supplements. General instructions  Do not use any products that contain nicotine or tobacco for at least 4 weeks before the procedure. These products include cigarettes, e-cigarettes, and chewing tobacco. If you need help quitting, ask your health care provider.  Plan to have someone take you home from the hospital or clinic.  If you  will be going home right after the procedure, plan to have someone with you for 24 hours.  You may have tests and imaging procedures.  Ask your health care provider: ? How your insertion site will be marked. Ask which artery will be used for the procedure. ? What steps will be taken to help prevent infection. These may include:  Removing hair at the insertion site.  Washing skin with a germ-killing soap.  Taking antibiotic medicine. What happens during the procedure?   An IV will be inserted into one of your veins.  Electrodes may be placed on your chest to monitor your heart rate during the procedure.  You will be given one or more of the following: ? A medicine to help you relax (sedative). ? A medicine to numb the area (local anesthetic) for catheter insertion.  A small incision will  be made for catheter insertion.  The catheter will be inserted into an artery using a guide wire. The location may be in your groin, your wrist, or the fold of your arm (near your elbow).  An X-ray procedure (fluoroscopy) will be used to help guide the catheter to the opening of the heart arteries.  A dye will be injected into the catheter. X-rays will be taken. The dye helps to show where any narrowing or blockages are located in the arteries.  Tell your health care provider if you have chest pain or trouble breathing.  A tiny wire will be guided to the blocked spot, and a balloon will be inflated to make the artery wider.  The stent will be expanded to crush the plaques into the wall of the vessel. The stent will hold the area open and improve the blood flow. Most stents have a drug coating to reduce the risk of the stent narrowing over time.  The artery may be made wider using a drill, laser, or other tools that remove plaques.  The catheter will be removed when the blood flow improves. The stent will stay where it was placed, and the lining of the artery will grow over it.  A bandage (dressing) will be placed on the insertion site. Pressure will be applied to stop bleeding.  The IV will be removed. This procedure may vary among health care providers and hospitals. What happens after the procedure?  Your blood pressure, heart rate, breathing rate, and blood oxygen level will be monitored until you leave the hospital or clinic.  If the procedure is done through the leg, you will lie flat in bed for a few hours or for as long as told by your health care provider. You will be instructed not to bend or cross your legs.  The insertion site and the pulse in your foot or wrist will be checked often.  You may have more blood tests, X-rays, and a test that records the electrical activity of your heart (electrocardiogram, or ECG).  Do not drive for 24 hours if you were given a sedative  during your procedure. Summary  Coronary angiogram with stent placement is a procedure to widen or open a narrowed coronary artery. This is done to treat heart problems.  Before the procedure, let your health care provider know about all the medical conditions and surgeries you have or have had.  This is a safe procedure. However, some problems may occur, including damage to nearby structures or organs, bleeding, blood clots, or allergies.  Follow your health care provider's instructions about eating, drinking, medicines, and other lifestyle  changes, such as quitting tobacco use before the procedure. This information is not intended to replace advice given to you by your health care provider. Make sure you discuss any questions you have with your health care provider. Document Revised: 08/27/2018 Document Reviewed: 08/27/2018 Elsevier Patient Education  [place].

## 2019-12-09 NOTE — Assessment & Plan Note (Signed)
The concern is for episodic exertional angina.  No chest pain now.  Limit exertion.  Refer to cardiology, expedited.  EKG discussed with patient at office visit.  No acute changes.  See notes on labs.  Rationale discussed with patient.  Routine cautions regarding chest pain given to patient.  He agrees with plan.  At least 30 minutes were devoted to patient care in this encounter (this can potentially include time spent reviewing the patient's file/history, interviewing and examining the patient, counseling/reviewing plan with patient, ordering referrals, ordering tests, reviewing relevant laboratory or x-ray data, and documenting the encounter).

## 2019-12-09 NOTE — Progress Notes (Signed)
New Outpatient Visit Date: 12/09/2019  Referring Provider: Tonia Ghent, MD [place],  [place] [address]  Chief Complaint: Chest pain  HPI:  [name] is a 74 y.o. male who is being seen today for the evaluation of chest pain at the request of Dr. Damita Dunnings. He has a history of hypertension and ulcerative colitis.  He was seen by Dr. Damita Dunnings yesterday and mention exertional chest pain when carrying heavy objects.  He was hospitalized in August with chest pain.  Work-up at that time including pharmacologic myocardial perfusion stress test and echocardiogram, was unremarkable other than grade 2 diastolic dysfunction with elevated left atrial pressure.  [name] reports that he first developed chest tightness in May and June as he was in the process of moving.  When carrying heavy boxes are taking out his trash, he would notice pressure across his chest that resolved with rest.  However, this gradually progressed over the summer to the point where he would have a vague discomfort in his chest all the time.  This has been present since August and led to the aforementioned ED visit and hospitalization in August.  Since then, his mild vague chest discomfort at rest has not changed significantly.  However, he finds that less exertion now brings on the discomfort.  There are no associated symptoms, denying shortness of breath, palpitations, lightheadedness, diaphoresis, and nausea.  [name] has not had any edema.  He occasionally takes Excedrin for his chest pain and feels like it helps.  He also notes that the sublingual nitroglycerin that he received during his hospitalization in August resolved the pain.  [name] reports having undergone a stress test in 2003 for similar discomfort.  He believes it was normal.  Our records reveal a cardiac catheterization in 2003 that showed mild ectasia in the distal LCx but otherwise no significant coronary artery disease.  LVEF was normal.  He  reports that his home blood pressures are frequently elevated even beyond his pressure in the office today.  --------------------------------------------------------------------------------------------------  Cardiovascular History & Procedures: Cardiovascular Problems:  Chest pain  Risk Factors:  Hypertension, male gender, and age greater than 10  Cath/PCI:  LHC (03/20/2001): Mild ectasia of the distal LCx.  Otherwise, no significant coronary artery disease.  Normal LVEF.  CV Surgery:  None  EP Procedures and Devices:  None  Non-Invasive Evaluation(s):  Pharmacologic MPI (10/02/2019): Low risk study without evidence of ischemia or scar.  LVEF 58%.  TTE (10/02/2019): Normal LV size and wall thickness.  LVEF 60-65% with grade 2 diastolic dysfunction and elevated filling pressure.  Normal RV size and function.  Normal PA pressure.  Trivial mitral regurgitation.  Recent CV Pertinent Labs: Lab Results  Component Value Date   CHOL 124 10/01/2019   CHOL 144 04/29/2019   CHOL 144 04/29/2019   HDL 23 (L) 10/01/2019   HDL 33 (L) 04/29/2019   LDLCALC 56 10/01/2019   LDLCALC 76 04/29/2019   LDLCALC 76 04/29/2019   LDLDIRECT 64.2 12/29/2008   TRIG 224 (H) 10/01/2019   TRIG 207 (A) 04/29/2019   CHOLHDL 5.4 10/01/2019   INR 1.22 06/13/2016   K 4.7 12/08/2019   BUN 13 12/08/2019   CREATININE 1.14 12/08/2019   CREATININE 1.07 04/29/2019    --------------------------------------------------------------------------------------------------  Past Medical History:  Diagnosis Date  . Hypertension   . Ulcerative colitis 5/81   Remission for years    Past Surgical History:  Procedure Laterality Date  . CARDIAC CATHETERIZATION  03/20/01   Cardiolite EF 55% 02/10/02  . CHOLECYSTECTOMY N/A 07/09/2017   Procedure: LAPAROSCOPIC CHOLECYSTECTOMY;  Surgeon: Judeth Horn, MD;  Location: [place];  Service: General;  Laterality: N/A;  . COLONOSCOPY  multiple  . ENDOSCOPIC RETROGRADE  CHOLANGIOPANCREATOGRAPHY (ERCP) WITH PROPOFOL N/A 07/08/2017   Procedure: ENDOSCOPIC RETROGRADE CHOLANGIOPANCREATOGRAPHY (ERCP) WITH PROPOFOL;  Surgeon: Jackquline Denmark, MD;  Location: [facility] ENDOSCOPY;  Service: Endoscopy;  Laterality: N/A;  . INGUINAL HERNIA REPAIR  04/09/06   Bilateral  . LAPAROSCOPIC APPENDECTOMY  03/1981  . REMOVAL OF STONES  07/08/2017   Procedure: REMOVAL OF STONES;  Surgeon: Jackquline Denmark, MD;  Location: [facility] ENDOSCOPY;  Service: Endoscopy;;  . Joan Mayans  07/08/2017   Procedure: Joan Mayans;  Surgeon: Jackquline Denmark, MD;  Location: [facility] ENDOSCOPY;  Service: Endoscopy;;    Current Meds  Medication Sig  . Ascorbic Acid (VITAMIN C) 1000 MG tablet Take 1,000 mg by mouth daily.    . carvedilol (COREG) 3.125 MG tablet Take 1 tablet (3.125 mg total) by mouth 2 (two) times daily.  . cholecalciferol (VITAMIN D3) 25 MCG (1000 UNIT) tablet Take 2,000 Units by mouth daily.   . Coenzyme Q10 (COQ10) 100 MG capsule Take 100 mg by mouth daily.   . famotidine (PEPCID) 20 MG tablet Take 1 tablet (20 mg total) by mouth 2 (two) times daily as needed for heartburn or indigestion.  . Garlic 614 MG TABS Take 500 mg by mouth daily.  Marland Kitchen losartan (COZAAR) 25 MG tablet TAKE 1 TABLET(25 MG) BY MOUTH DAILY    Allergies: Amoxicillin, Gabapentin, Lyrica [pregabalin], Morphine sulfate, and Sulfonamide derivatives  Social History   Tobacco Use  . Smoking status: Never Smoker  . Smokeless tobacco: Never Used  Substance Use Topics  . Alcohol use: No  . Drug use: No    Family History  Problem Relation Age of Onset  . Cancer Mother        ? GYN, died after hysterectomy  . Stroke Father   . CAD Father   . Cancer Brother 70       Bladder  . Prostate cancer Brother   . CAD Brother 9       CABG  . Prostate cancer Brother   . Colon cancer Neg Hx   . Esophageal cancer Neg Hx   . Rectal cancer Neg Hx   . Stomach cancer Neg Hx     Review of Systems: A 12-system review of systems was  performed and was negative except as noted in the HPI.  --------------------------------------------------------------------------------------------------  Physical Exam: BP (!) 150/92 (BP Location: Right Arm, Patient Position: Sitting, Cuff Size: Normal)   Pulse 60   Ht 5\' 8"  (1.727 m)   Wt 176 lb (79.8 kg)   SpO2 98%   BMI 26.76 kg/m   General: NAD. HEENT: No conjunctival pallor or scleral icterus. Facemask in place. Neck: Supple without lymphadenopathy, thyromegaly, JVD, or HJR. No carotid bruit. Lungs: Normal work of breathing. Clear to auscultation bilaterally without wheezes or crackles. Heart: Regular rate and rhythm without murmurs, rubs, or gallops. Non-displaced PMI. Abd: Bowel sounds present. Soft, NT/ND without hepatosplenomegaly Ext: No lower extremity edema. Radial, PT, and DP pulses are 2+ bilaterally Skin: Warm and dry without rash. Neuro: CNIII-XII intact. Strength and fine-touch sensation intact in upper and lower extremities bilaterally. Psych: Normal mood and affect.  EKG: Normal sinus rhythm with right bundle branch block.  No significant change from prior tracing on 10/01/2019.  Lab Results  Component Value Date  WBC 6.2 12/08/2019   HGB 16.1 12/08/2019   HCT 44.8 12/08/2019   MCV 86 12/08/2019   PLT 131 (L) 12/08/2019    Lab Results  Component Value Date   NA 144 12/08/2019   K 4.7 12/08/2019   CL 106 12/08/2019   CO2 26 12/08/2019   BUN 13 12/08/2019   CREATININE 1.14 12/08/2019   GLUCOSE 95 12/08/2019   ALT 16 04/29/2019    Lab Results  Component Value Date   CHOL 124 10/01/2019   HDL 23 (L) 10/01/2019   LDLCALC 56 10/01/2019   LDLDIRECT 64.2 12/29/2008   TRIG 224 (H) 10/01/2019   CHOLHDL 5.4 10/01/2019     --------------------------------------------------------------------------------------------------  ASSESSMENT AND PLAN: Chest pain: [name] reports 4 to 5 months of exertional chest pain that is concerning for angina.  He  also notes persistent vague chest discomfort at rest since early August.  ED visit at that time with subsequent hospitalization was unrevealing with low risk stress test and normal echocardiogram other than diastolic dysfunction.  [name] continues to have worsening exertional chest pain concerning for CCS class III-IV angina.  I have personally reviewed his myocardial perfusion stress test images which show a fixed basal and mid inferior defect most consistent with diaphragmatic attenuation.  There appears to be subtle hypoperfusion in the anterior and anterolateral walls on the stress images that may be artifactual though ischemia is certainly a concern.  I have recommended that we proceed with cardiac catheterization and possible PCI as soon as possible, which [name] is in agreement with.  Catheterization is scheduled for 12/11/2019.  I have reviewed the risks, indications, and alternatives to cardiac catheterization, possible angioplasty, and stenting with the patient. Risks include but are not limited to bleeding, infection, vascular injury, stroke, myocardial infection, arrhythmia, kidney injury, radiation-related injury in the case of prolonged fluoroscopy use, emergency cardiac surgery, and death. The patient understands the risks of serious complication is 1-2 in 8242 with diagnostic cardiac cath and 1-2% or less with angioplasty/stenting.  In the meantime, will I have asked [name] to begin taking aspirin 81 mg daily and isosorbide mononitrate 15 mg daily.  I have also provided him with a prescription for sublingual nitroglycerin to be used as needed for chest pain.  I advised him to call 911 if he has any worsening in his baseline symptoms.  Hypertension: Blood pressure suboptimally controlled today and often higher at home.  As above, we will add isosorbide mononitrate 15 mg daily.  Further titration of medications may be necessary based on results of catheterization.  Follow-up: Return to  clinic in 1 to 2 weeks after catheterization. Nelva Bush, MD 12/09/2019 1:39 PM

## 2019-12-10 ENCOUNTER — Ambulatory Visit: Payer: Managed Care, Other (non HMO) | Admitting: Internal Medicine

## 2019-12-10 ENCOUNTER — Telehealth: Payer: Self-pay | Admitting: *Deleted

## 2019-12-10 LAB — SARS CORONAVIRUS 2 (TAT 6-24 HRS): SARS Coronavirus 2: NEGATIVE

## 2019-12-10 NOTE — Telephone Encounter (Signed)
-----   Message from Nelva Bush, MD sent at 12/10/2019  6:45 AM EDT ----- COVID-19 negative.  Ok to proceed with catheterization as planned.

## 2019-12-10 NOTE — Telephone Encounter (Signed)
No answer. Left detailed message with results, ok per DPR, and to call back if any questions.  

## 2019-12-11 ENCOUNTER — Encounter
Admission: RE | Disposition: A | Discharge status: Home / Self Care | Payer: Self-pay | Source: Home / Self Care | Attending: Internal Medicine

## 2019-12-11 ENCOUNTER — Other Ambulatory Visit: Payer: Self-pay

## 2019-12-11 ENCOUNTER — Encounter: Payer: Self-pay | Admitting: Internal Medicine

## 2019-12-11 ENCOUNTER — Ambulatory Visit
Admission: RE | Admit: 2019-12-11 | Discharge: 2019-12-11 | Disposition: A | Discharge status: Home / Self Care | Payer: Managed Care, Other (non HMO) | Attending: Internal Medicine | Admitting: Internal Medicine

## 2019-12-11 DIAGNOSIS — Z882 Allergy status to sulfonamides status: Secondary | ICD-10-CM | POA: Diagnosis not present

## 2019-12-11 DIAGNOSIS — I25110 Atherosclerotic heart disease of native coronary artery with unstable angina pectoris: Secondary | ICD-10-CM | POA: Insufficient documentation

## 2019-12-11 DIAGNOSIS — I10 Essential (primary) hypertension: Secondary | ICD-10-CM | POA: Diagnosis not present

## 2019-12-11 DIAGNOSIS — Z888 Allergy status to other drugs, medicaments and biological substances status: Secondary | ICD-10-CM | POA: Insufficient documentation

## 2019-12-11 DIAGNOSIS — Z79899 Other long term (current) drug therapy: Secondary | ICD-10-CM | POA: Diagnosis not present

## 2019-12-11 DIAGNOSIS — R079 Chest pain, unspecified: Secondary | ICD-10-CM

## 2019-12-11 DIAGNOSIS — Z880 Allergy status to penicillin: Secondary | ICD-10-CM | POA: Diagnosis not present

## 2019-12-11 DIAGNOSIS — Z885 Allergy status to narcotic agent status: Secondary | ICD-10-CM | POA: Diagnosis not present

## 2019-12-11 DIAGNOSIS — I200 Unstable angina: Secondary | ICD-10-CM | POA: Diagnosis present

## 2019-12-11 HISTORY — PX: LEFT HEART CATH AND CORONARY ANGIOGRAPHY: CATH118249

## 2019-12-11 LAB — POCT ACTIVATED CLOTTING TIME: Activated Clotting Time: 257 seconds

## 2019-12-11 SURGERY — LEFT HEART CATH AND CORONARY ANGIOGRAPHY
Anesthesia: Moderate Sedation | Laterality: Left

## 2019-12-11 MED ORDER — VERAPAMIL HCL 2.5 MG/ML IV SOLN
INTRAVENOUS | Status: AC
  Filled 2019-12-11: qty 2

## 2019-12-11 MED ORDER — SODIUM CHLORIDE 0.9 % IV SOLN: 250.0000 mL | INTRAVENOUS | Status: DC | PRN

## 2019-12-11 MED ORDER — ACETAMINOPHEN 325 MG PO TABS: 650.0000 mg | ORAL_TABLET | ORAL | Status: DC | PRN

## 2019-12-11 MED ORDER — ASPIRIN 81 MG PO CHEW
CHEWABLE_TABLET | ORAL | Status: AC
  Filled 2019-12-11: qty 1

## 2019-12-11 MED ORDER — IOHEXOL 300 MG/ML  SOLN
INTRAMUSCULAR | Status: DC | PRN
  Administered 2019-12-11: 85 mL

## 2019-12-11 MED ORDER — SODIUM CHLORIDE 0.9% FLUSH: 3.0000 mL | INTRAVENOUS | Status: DC | PRN

## 2019-12-11 MED ORDER — FENTANYL CITRATE (PF) 100 MCG/2ML IJ SOLN
INTRAMUSCULAR | Status: AC
  Filled 2019-12-11: qty 2

## 2019-12-11 MED ORDER — ADENOSINE (DIAGNOSTIC) 140MCG/KG/MIN
INTRAVENOUS | Status: DC | PRN
  Administered 2019-12-11: 140 ug/kg/min via INTRAVENOUS

## 2019-12-11 MED ORDER — ADENOSINE 6 MG/2ML IV SOLN
INTRAVENOUS | Status: AC
  Filled 2019-12-11: qty 2

## 2019-12-11 MED ORDER — SODIUM CHLORIDE 0.9% FLUSH: 3.0000 mL | Freq: Two times a day (BID) | INTRAVENOUS | Status: DC

## 2019-12-11 MED ORDER — MIDAZOLAM HCL 2 MG/2ML IJ SOLN
INTRAMUSCULAR | Status: AC
  Filled 2019-12-11: qty 2

## 2019-12-11 MED ORDER — HEPARIN SODIUM (PORCINE) 1000 UNIT/ML IJ SOLN
INTRAMUSCULAR | Status: DC | PRN
  Administered 2019-12-11: 5000 [IU] via INTRAVENOUS
  Administered 2019-12-11: 4000 [IU] via INTRAVENOUS

## 2019-12-11 MED ORDER — FENTANYL CITRATE (PF) 100 MCG/2ML IJ SOLN
INTRAMUSCULAR | Status: DC | PRN
  Administered 2019-12-11: 25 ug via INTRAVENOUS

## 2019-12-11 MED ORDER — NITROGLYCERIN 1 MG/10 ML FOR IR/CATH LAB
INTRA_ARTERIAL | Status: DC | PRN
  Administered 2019-12-11: 200 ug via INTRACORONARY

## 2019-12-11 MED ORDER — SODIUM CHLORIDE 0.9 % IV SOLN: INTRAVENOUS | Status: DC

## 2019-12-11 MED ORDER — ONDANSETRON HCL 4 MG/2ML IJ SOLN: 4.0000 mg | Freq: Four times a day (QID) | INTRAMUSCULAR | Status: DC | PRN

## 2019-12-11 MED ORDER — ASPIRIN 81 MG PO CHEW: 81.0000 mg | CHEWABLE_TABLET | Freq: Every day | ORAL | Status: DC

## 2019-12-11 MED ORDER — HYDRALAZINE HCL 20 MG/ML IJ SOLN: 10.0000 mg | INTRAMUSCULAR | Status: DC | PRN

## 2019-12-11 MED ORDER — LABETALOL HCL 5 MG/ML IV SOLN: 10.0000 mg | INTRAVENOUS | Status: DC | PRN

## 2019-12-11 MED ORDER — HEPARIN (PORCINE) IN NACL 1000-0.9 UT/500ML-% IV SOLN
INTRAVENOUS | Status: DC | PRN
  Administered 2019-12-11: 500 mL

## 2019-12-11 MED ORDER — ASPIRIN 81 MG PO CHEW: 81.0000 mg | CHEWABLE_TABLET | ORAL | Status: DC

## 2019-12-11 MED ORDER — SODIUM CHLORIDE 0.9 % WEIGHT BASED INFUSION: 1.0000 mL/kg/h | INTRAVENOUS | Status: DC

## 2019-12-11 MED ORDER — HEPARIN SODIUM (PORCINE) 1000 UNIT/ML IJ SOLN
INTRAMUSCULAR | Status: AC
  Filled 2019-12-11: qty 1

## 2019-12-11 MED ORDER — HEPARIN (PORCINE) IN NACL 1000-0.9 UT/500ML-% IV SOLN
INTRAVENOUS | Status: AC
  Filled 2019-12-11: qty 1000

## 2019-12-11 MED ORDER — SODIUM CHLORIDE 0.9 % WEIGHT BASED INFUSION: 3.0000 mL/kg/h | INTRAVENOUS | Status: AC

## 2019-12-11 MED ORDER — VERAPAMIL HCL 2.5 MG/ML IV SOLN
INTRAVENOUS | Status: DC | PRN
  Administered 2019-12-11: 2.5 mg via INTRA_ARTERIAL

## 2019-12-11 MED ORDER — ROSUVASTATIN CALCIUM 5 MG PO TABS: 5.0000 mg | ORAL_TABLET | Freq: Every day | ORAL | 11 refills | Status: DC

## 2019-12-11 MED ORDER — LIDOCAINE HCL (PF) 1 % IJ SOLN
INTRAMUSCULAR | Status: AC
  Filled 2019-12-11: qty 30

## 2019-12-11 MED ORDER — MIDAZOLAM HCL 2 MG/2ML IJ SOLN
INTRAMUSCULAR | Status: DC | PRN
  Administered 2019-12-11: 1 mg via INTRAVENOUS

## 2019-12-11 MED ORDER — ADENOSINE (DIAGNOSTIC) 3 MG/ML IV SOLN
INTRAVENOUS | Status: AC
  Filled 2019-12-11: qty 30

## 2019-12-11 SURGICAL SUPPLY — 15 items
CATH INFINITI 5 FR JL3.5 (CATHETERS) ×2 IMPLANT
CATH INFINITI 5FR ANG PIGTAIL (CATHETERS) ×2 IMPLANT
CATH INFINITI JR4 5F (CATHETERS) ×2 IMPLANT
CATH LAUNCHER 5F EBU3.0 (CATHETERS) IMPLANT
CATH VISTA GUIDE 6FR XB3 (CATHETERS) ×2 IMPLANT
CATHETER LAUNCHER 5F EBU3.0 (CATHETERS)
DEVICE RAD TR BAND REGULAR (VASCULAR PRODUCTS) ×2 IMPLANT
GLIDESHEATH SLEND SS 6F .021 (SHEATH) ×2 IMPLANT
GUIDEWIRE INQWIRE 1.5J.035X260 (WIRE) IMPLANT
GUIDEWIRE PRESS OMNI 185 ST (WIRE) ×2 IMPLANT
INQWIRE 1.5J .035X260CM (WIRE) ×3
KIT ENCORE 26 ADVANTAGE (KITS) ×2 IMPLANT
PACK CARDIAC CATH (CUSTOM PROCEDURE TRAY) ×3 IMPLANT
SET ATX SIMPLICITY (MISCELLANEOUS) ×2 IMPLANT
SHEATH 6FR 75 DEST SLENDER (SHEATH) ×2 IMPLANT

## 2019-12-11 NOTE — Progress Notes (Signed)
TR band off, no bleeding, no hematoma noted. Instructions given regarding right radial care for home, pt and wife verbalize understanding.

## 2019-12-11 NOTE — Interval H&P Note (Signed)
History and Physical Interval Note:  12/11/2019 7:53 AM  Billy Kim  has presented today for surgery, with the diagnosis of accelerating angina.  The various methods of treatment have been discussed with the patient and family. After consideration of risks, benefits and other options for treatment, the patient has consented to  Procedure(s): LEFT HEART CATH AND CORONARY ANGIOGRAPHY (Left) as a surgical intervention.  The patient's history has been reviewed, patient examined, no change in status, stable for surgery.  I have reviewed the patient's chart and labs.  Questions were answered to the patient's satisfaction.    Cath Lab Visit (complete for each Cath Lab visit)  Clinical Evaluation Leading to the Procedure:   ACS: No.  Non-ACS:    Anginal Classification: CCS IV  Anti-ischemic medical therapy: Minimal Therapy (1 class of medications)  Non-Invasive Test Results: Low-risk stress test findings: cardiac mortality <1%/year  Prior CABG: No previous CABG  Billy Kim

## 2019-12-11 NOTE — Brief Op Note (Addendum)
BRIEF CARDIAC CATHETERIZATION NOTE  DATE: 12/11/2019  TIME: 9:10 AM  PATIENT:  Billy Kim  74 y.o. male  PRE-OPERATIVE DIAGNOSIS:  Accelerating angina  POST-OPERATIVE DIAGNOSIS:  Same  PROCEDURE:  Procedure(s): LEFT HEART CATH AND CORONARY ANGIOGRAPHY (Left)  SURGEON:  Surgeon(s) and Role:    Nelva Bush, MD - Primary  FINDINGS: 1. Two vessel coronary artery disease with sequential 60% and 40% proximal/mid LAD stenoses that are not hemodynamically significant (iFR 0.92, FFR 0.84), as well as chronic total occlusions of medium-caliber D1 and small, non-dominant RCA.  Mild to moderate plaquing noted in OM branches. 2. Normal left ventricular systolic function with mildly elevated filling pressure.  RECOMMENDATIONS: 1. Escalate antianginal therapy; will have patient start taking isosorbide mononitrate 15 mg daily, as prescribed earlier this week.  If he has refractory symptoms despite maximal tolerated doses of at least 2 antianginal medications, PCI to CTO of D1 could be considered. 2. Aggressive secondary prevention; will add rosuvastatin 5 mg daily.  Nelva Bush, MD [facility]

## 2019-12-11 NOTE — Progress Notes (Signed)
Patient alert/oriented x4, no c/o's. Dr. Saunders Revel in to see patient prior to cath.  Vitals stable, afebrile, SB-SR  Lungs CTA.

## 2019-12-11 NOTE — Progress Notes (Signed)
Dr. Saunders Revel spoke to patient and patient's wife regarding procedure and follow up. Questions asked and answered. Verbalizes understanding.

## 2019-12-14 ENCOUNTER — Encounter: Payer: Self-pay | Admitting: Internal Medicine

## 2019-12-17 ENCOUNTER — Ambulatory Visit (INDEPENDENT_AMBULATORY_CARE_PROVIDER_SITE_OTHER): Payer: Managed Care, Other (non HMO) | Admitting: Internal Medicine

## 2019-12-17 ENCOUNTER — Other Ambulatory Visit: Payer: Self-pay

## 2019-12-17 ENCOUNTER — Encounter: Payer: Self-pay | Admitting: Internal Medicine

## 2019-12-17 VITALS — BP 130/80 | HR 64 | Ht 68.0 in | Wt 182.0 lb

## 2019-12-17 DIAGNOSIS — E785 Hyperlipidemia, unspecified: Secondary | ICD-10-CM | POA: Diagnosis not present

## 2019-12-17 DIAGNOSIS — I25118 Atherosclerotic heart disease of native coronary artery with other forms of angina pectoris: Secondary | ICD-10-CM | POA: Insufficient documentation

## 2019-12-17 DIAGNOSIS — I10 Essential (primary) hypertension: Secondary | ICD-10-CM | POA: Diagnosis not present

## 2019-12-17 DIAGNOSIS — E782 Mixed hyperlipidemia: Secondary | ICD-10-CM | POA: Insufficient documentation

## 2019-12-17 MED ORDER — ISOSORBIDE MONONITRATE ER 30 MG PO TB24: 30.0000 mg | ORAL_TABLET | Freq: Every day | ORAL | 1 refills | Status: DC

## 2019-12-17 NOTE — Progress Notes (Signed)
Follow-up Outpatient Visit Date: 12/17/2019  Primary Care Provider: Tonia Ghent, MD [place] [address]  Chief Complaint: Follow-up chest pain status post catheterization last week  HPI:  Billy Kim is a 74 y.o. male with history of coronary artery disease being managed medically, hypertension, and ulcerative colitis, who presents for follow-up of CAD.  He underwent cardiac catheterization for accelerating angina last week.  He was found to have chronic total occlusions of D1 and nondominant RCA as well as moderate proximal LAD disease that was not hemodynamically significant by IFR/FFR.  Billy Kim was started on carvedilol and isosorbide mononitrate.  He reports that his resting chest pain has completely resolved and that his exertional discomfort is significantly better.  He still feels some mild tightness in his chest when he exerts himself significantly.  He is tolerating his medications well without side effects.  He denies shortness of breath, lightheadedness, headaches, and palpitations.  His right radial catheterization site is healing well..  --------------------------------------------------------------------------------------------------  Past Medical History:  Diagnosis Date  . Hypertension   . Ulcerative colitis 5/81   Remission for years   Past Surgical History:  Procedure Laterality Date  . CARDIAC CATHETERIZATION  03/20/01   Cardiolite EF 55% 02/10/02  . CHOLECYSTECTOMY N/A 07/09/2017   Procedure: LAPAROSCOPIC CHOLECYSTECTOMY;  Surgeon: Judeth Horn, MD;  Location: [place];  Service: General;  Laterality: N/A;  . COLONOSCOPY  multiple  . ENDOSCOPIC RETROGRADE CHOLANGIOPANCREATOGRAPHY (ERCP) WITH PROPOFOL N/A 07/08/2017   Procedure: ENDOSCOPIC RETROGRADE CHOLANGIOPANCREATOGRAPHY (ERCP) WITH PROPOFOL;  Surgeon: Jackquline Denmark, MD;  Location: [facility] ENDOSCOPY;  Service: Endoscopy;  Laterality: N/A;  . INGUINAL HERNIA REPAIR  04/09/06   Bilateral  .  LAPAROSCOPIC APPENDECTOMY  03/1981  . LEFT HEART CATH AND CORONARY ANGIOGRAPHY Left 12/11/2019   Procedure: LEFT HEART CATH AND CORONARY ANGIOGRAPHY;  Surgeon: Nelva Bush, MD;  Location: [place] CV LAB;  Service: Cardiovascular;  Laterality: Left;  . REMOVAL OF STONES  07/08/2017   Procedure: REMOVAL OF STONES;  Surgeon: Jackquline Denmark, MD;  Location[facility] ENDOSCOPY;  Service: Endoscopy;;  . Joan Mayans  07/08/2017   Procedure: Joan Mayans;  Surgeon: Jackquline Denmark, MD;  Location: [facility] ENDOSCOPY;  Service: Endoscopy;;    Current Meds  Medication Sig  . Ascorbic Acid (VITAMIN C) 1000 MG tablet Take 1,000 mg by mouth daily.    Marland Kitchen aspirin EC 81 MG tablet Take 1 tablet (81 mg total) by mouth daily. Swallow whole.  . carvedilol (COREG) 3.125 MG tablet Take 1 tablet (3.125 mg total) by mouth 2 (two) times daily.  . cholecalciferol (VITAMIN D3) 25 MCG (1000 UNIT) tablet Take 2,000 Units by mouth daily.   . Coenzyme Q10 (COQ10) 100 MG capsule Take 100 mg by mouth daily.   . famotidine (PEPCID) 20 MG tablet Take 1 tablet (20 mg total) by mouth 2 (two) times daily as needed for heartburn or indigestion.  . Garlic 295 MG TABS Take 500 mg by mouth daily.  Marland Kitchen losartan (COZAAR) 25 MG tablet TAKE 1 TABLET(25 MG) BY MOUTH DAILY  . nitroGLYCERIN (NITROSTAT) 0.4 MG SL tablet Place 1 tablet (0.4 mg total) under the tongue every 5 (five) minutes as needed for chest pain. Maximum of 3 doses.  . rosuvastatin (CRESTOR) 5 MG tablet Take 1 tablet (5 mg total) by mouth daily.  . [DISCONTINUED] isosorbide mononitrate (IMDUR) 30 MG 24 hr tablet Take 0.5 tablets (15 mg total) by mouth daily.    Allergies: Amoxicillin, Gabapentin, Lyrica [pregabalin], Morphine  sulfate, and Sulfonamide derivatives  Social History   Tobacco Use  . Smoking status: Never Smoker  . Smokeless tobacco: Never Used  Vaping Use  . Vaping Use: Never used  Substance Use Topics  . Alcohol use: No  . Drug use: No    Family History    Problem Relation Age of Onset  . Cancer Mother        ? GYN, died after hysterectomy  . Stroke Father   . CAD Father   . Cancer Brother 70       Bladder  . Prostate cancer Brother   . CAD Brother 49       CABG  . Prostate cancer Brother   . Colon cancer Neg Hx   . Esophageal cancer Neg Hx   . Rectal cancer Neg Hx   . Stomach cancer Neg Hx     Review of Systems: A 12-system review of systems was performed and was negative except as noted in the HPI.  --------------------------------------------------------------------------------------------------  Physical Exam: BP 130/80 (BP Location: Left Arm, Patient Position: Sitting, Cuff Size: Normal)   Pulse 64   Ht 5\' 8"  (1.727 m)   Wt 182 lb (82.6 kg)   SpO2 98%   BMI 27.67 kg/m   General: NAD. Neck: No JVD or HJR. Lungs: Clear to auscultation bilaterally without wheezes or crackles. Heart: Regular rate and rhythm without murmurs, rubs, or gallops. Extremities: Right radial arteriotomy site well-healed.  Minimal bruising noted.  2+ right radial pulse.  EKG: Normal sinus rhythm with right bundle branch block.  Heart rate has increased since 12/09/2019.  Otherwise, there has been no significant interval change.  Lab Results  Component Value Date   WBC 6.2 12/08/2019   HGB 16.1 12/08/2019   HCT 44.8 12/08/2019   MCV 86 12/08/2019   PLT 131 (L) 12/08/2019    Lab Results  Component Value Date   NA 144 12/08/2019   K 4.7 12/08/2019   CL 106 12/08/2019   CO2 26 12/08/2019   BUN 13 12/08/2019   CREATININE 1.14 12/08/2019   GLUCOSE 95 12/08/2019   ALT 16 04/29/2019    Lab Results  Component Value Date   CHOL 124 10/01/2019   HDL 23 (L) 10/01/2019   LDLCALC 56 10/01/2019   LDLDIRECT 64.2 12/29/2008   TRIG 224 (H) 10/01/2019   CHOLHDL 5.4 10/01/2019    --------------------------------------------------------------------------------------------------  ASSESSMENT AND PLAN: Coronary artery disease with stable  angina: Billy Kim has improved considerably with addition of carvedilol and isosorbide mononitrate.  He still has some exertional chest pain consistent with CCS class II angina.  Catheterization last week was notable for moderate LAD disease that was not hemodynamically significant as well as chronic total occlusions of D1 and nondominant RCA.  We have agreed to escalate to isosorbide mononitrate 30 mg daily.  We will continue aspirin and rosuvastatin for secondary prevention.  Hypertension: Blood pressure upper normal today.  As above, we will increase isosorbide mononitrate.  No other medication changes today.  Hyperlipidemia: Continue low-dose rosuvastatin, given good LDL control.  Billy Kim will need follow-up lipid panel in about 3 months.  Follow-up: Return to clinic in 3 months.  Nelva Bush, MD 12/17/2019 5:56 PM

## 2019-12-17 NOTE — Patient Instructions (Signed)
Medication Instructions:  Your physician has recommended you make the following change in your medication:  1- INCREASE Imdur to 30 mg by mouth once a day.  *If you need a refill on your cardiac medications before your next appointment, please call your pharmacy*  Follow-Up: At [facility], you and your health needs are our priority.  As part of our continuing mission to provide you with exceptional heart care, we have created designated Provider Care Teams.  These Care Teams include your primary Cardiologist (physician) and Advanced Practice Providers (APPs -  Physician Assistants and Nurse Practitioners) who all work together to provide you with the care you need, when you need it.  We recommend signing up for the patient portal called "MyChart".  Sign up information is provided on this After Visit Summary.  MyChart is used to connect with patients for Virtual Visits (Telemedicine).  Patients are able to view lab/test results, encounter notes, upcoming appointments, etc.  Non-urgent messages can be sent to your provider as well.   To learn more about what you can do with MyChart, go to [email or web address].    Your next appointment:   3 month(s)  The format for your next appointment:   In Person  Provider:   You may see DR Harrell Gave END or one of the following Advanced Practice Providers on your designated Care Team:    Murray Hodgkins, NP  Christell Faith, PA-C  Marrianne Mood, PA-C  Cadence Delano, [place]

## 2019-12-24 ENCOUNTER — Ambulatory Visit: Payer: Medicare Other | Admitting: Internal Medicine

## 2020-01-20 DIAGNOSIS — I2510 Atherosclerotic heart disease of native coronary artery without angina pectoris: Secondary | ICD-10-CM

## 2020-01-20 HISTORY — DX: Atherosclerotic heart disease of native coronary artery without angina pectoris: I25.10

## 2020-02-10 ENCOUNTER — Encounter: Payer: Self-pay | Admitting: Internal Medicine

## 2020-02-10 ENCOUNTER — Ambulatory Visit (INDEPENDENT_AMBULATORY_CARE_PROVIDER_SITE_OTHER): Payer: Managed Care, Other (non HMO) | Admitting: Internal Medicine

## 2020-02-10 VITALS — BP 130/86 | HR 60 | Ht 67.0 in | Wt 181.5 lb

## 2020-02-10 DIAGNOSIS — R0789 Other chest pain: Secondary | ICD-10-CM | POA: Diagnosis not present

## 2020-02-10 DIAGNOSIS — R066 Hiccough: Secondary | ICD-10-CM | POA: Diagnosis not present

## 2020-02-10 DIAGNOSIS — R1319 Other dysphagia: Secondary | ICD-10-CM | POA: Diagnosis not present

## 2020-02-10 DIAGNOSIS — I25118 Atherosclerotic heart disease of native coronary artery with other forms of angina pectoris: Secondary | ICD-10-CM

## 2020-02-10 NOTE — Progress Notes (Signed)
Billy Kim 74 y.o. [birth year] [id number]  Assessment & Plan:   Encounter Diagnoses  Name Primary?  . Esophageal dysphagia Yes  . Hiccups   . Musculoskeletal chest pain     Evaluate symptoms that I think are esophageal dysphagia with EGD possible dilation.  It sounds like he is getting some impact dysphagia triggering a hiccup.  The upper chest pain sounds very musculoskeletal to me associated with lifting or physical activity with arm and shoulder movement.  He says it is not life altering he can try moist heat as needed.  The risks and benefits as well as alternatives of endoscopic procedure(s) have been discussed and reviewed. All questions answered. The patient agrees to proceed.   I appreciate the opportunity to care for this patient. CC: Billy Ghent, MD   Subjective:   Chief Complaint: Chest pain and hiccups  HPI This 74 year old white man with a history of negative screening colonoscopy in 2015 and ERCP for common bile duct stones in 2019, and a reported diagnosis of ulcerative colitis that has been in remission for many years, as well as a history of coronary artery disease is here with these 2 complaints.  The chest pain he describes as exertional but he goes on to say it really occurs with things like lifting boxes and raking leaves and the onset was associated with moving and lifting a bunch of boxes in October.  Its not as prominent as it was.  He was concerned about it and went to cardiology and he had a negative myocardial scan in August and went on to have a catheterization with stable unchanged coronary artery disease and the conclusion was he was not having cardiac chest pain.  I have reviewed the notes on this.  He gets a little short winded when he has this problem.  If he rests he feels better it may last minutes.  It is not significantly interfering with life.  He also talks about issues where when he is eating solid food sometimes he will feel a  pressure in the lower sternal area and have some hiccups and he has to wait or drink some fluid for this to resolve.  It does sound like impact dysphagia.  It has not been a problem with liquids.  He has some occasional heartburn and he will take an over-the-counter Pepcid with prompt relief.  He drinks 2 or 3 glasses of tea a week and there is rare coffee.  Does not seem to have a GERD trigger diet  Wt Readings from Last 3 Encounters:  02/10/20 181 lb 8 oz (82.3 kg)  12/17/19 182 lb (82.6 kg)  12/11/19 175 lb 14.8 oz (79.8 kg)   GI review of systems is otherwise negative. Allergies  Allergen Reactions  . Amoxicillin Other (See Comments)    Caused headache Has patient had a PCN reaction causing immediate rash, facial/tongue/throat swelling, SOB or lightheadedness with hypotension: No Has patient had a PCN reaction causing severe rash involving mucus membranes or skin necrosis: No Has patient had a PCN reaction that required hospitalization: No Has patient had a PCN reaction occurring within the last 10 years: No If all of the above answers are "NO", then may proceed with Cephalosporin use.  . Lyrica [Pregabalin]     Intolerant, worsening headache  . Morphine Sulfate Other (See Comments)    headache  . Neurontin [Gabapentin] Other (See Comments)    headache  . Sulfonamide Derivatives Other (See Comments)  headache   Current Meds  Medication Sig  . Ascorbic Acid (VITAMIN C) 1000 MG tablet Take 1,000 mg by mouth daily.  Marland Kitchen aspirin EC 81 MG tablet Take 1 tablet (81 mg total) by mouth daily. Swallow whole.  . carvedilol (COREG) 3.125 MG tablet Take 1 tablet (3.125 mg total) by mouth 2 (two) times daily.  . cholecalciferol (VITAMIN D3) 25 MCG (1000 UNIT) tablet Take 2,000 Units by mouth daily.   . Coenzyme Q10 100 MG capsule Take 100 mg by mouth daily.  . famotidine (PEPCID) 20 MG tablet Take 1 tablet (20 mg total) by mouth 2 (two) times daily as needed for heartburn or indigestion.  .  Garlic [id number] MG TABS Take 500 mg by mouth daily.  . isosorbide mononitrate (IMDUR) 30 MG 24 hr tablet Take 1 tablet (30 mg total) by mouth daily.  Marland Kitchen losartan (COZAAR) 25 MG tablet TAKE 1 TABLET(25 MG) BY MOUTH DAILY  . rosuvastatin (CRESTOR) 5 MG tablet Take 1 tablet (5 mg total) by mouth daily.   Past Medical History:  Diagnosis Date  . HLD (hyperlipidemia)   . Hypertension   . Ulcerative colitis 5/81   Remission for years   Past Surgical History:  Procedure Laterality Date  . CARDIAC CATHETERIZATION  03/20/01   Cardiolite EF 55% 02/10/02  . CHOLECYSTECTOMY N/A 07/09/2017   Procedure: LAPAROSCOPIC CHOLECYSTECTOMY;  Surgeon: Billy Horn, MD;  Location: [place] [facility];  Service: General;  Laterality: N/A;  . COLONOSCOPY  multiple  . ENDOSCOPIC RETROGRADE CHOLANGIOPANCREATOGRAPHY (ERCP) WITH PROPOFOL N/A 07/08/2017   Procedure: ENDOSCOPIC RETROGRADE CHOLANGIOPANCREATOGRAPHY (ERCP) WITH PROPOFOL;  Surgeon: Billy Denmark, MD;  Location: [facility] ENDOSCOPY;  Service: Endoscopy;  Laterality: N/A;  . INGUINAL HERNIA REPAIR  04/09/06   Bilateral  . LAPAROSCOPIC APPENDECTOMY  03/1981  . LEFT HEART CATH AND CORONARY ANGIOGRAPHY Left 12/11/2019   Procedure: LEFT HEART CATH AND CORONARY ANGIOGRAPHY;  Surgeon: Billy Bush, MD;  Location: [place] CV LAB;  Service: Cardiovascular;  Laterality: Left;  . REMOVAL OF STONES  07/08/2017   Procedure: REMOVAL OF STONES;  Surgeon: Billy Denmark, MD;  Location: [facility] ENDOSCOPY;  Service: Endoscopy;;  . 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   family history includes Bladder Cancer in his nephew; Bladder Cancer (age of onset: 64) in his brother; Bone cancer in his paternal grandfather; CAD in his father; CAD (age of onset: 68) in his brother;  Cancer in his mother; Hypertension in his brother, brother, brother, brother, brother, sister, and son; Prostate cancer in his brother and nephew; Stroke in his father.   Review of Systems See HPI all other review of systems negative  Objective:   Physical Exam BP 130/86 (BP Location: Left Arm, Patient Position: Sitting, Cuff Size: Normal)   Pulse 60   Ht 5\' 7"  (1.702 m) Comment: height measured without shoes  Wt 181 lb 8 oz (82.3 kg)   BMI 28.43 kg/m  WDWN elderly wm NAD Anicteric Lungs cta Chest - nontender and no pain with shoulder, arm movements Heart s1s2 no rmg abd soft and NT no HSM/mass Alert and oriented x 3

## 2020-02-10 NOTE — Patient Instructions (Signed)
You have been scheduled for an endoscopy. Please follow written instructions given to you at your visit today. If you use inhalers (even only as needed), please bring them with you on the day of your procedure.   Due to recent changes in healthcare laws, you may see the results of your imaging and laboratory studies on MyChart before your provider has had a chance to review them.  We understand that in some cases there may be results that are confusing or concerning to you. Not all laboratory results come back in the same time frame and the provider may be waiting for multiple results in order to interpret others.  Please give [place] 48 hours in order for your provider to thoroughly review all the results before contacting the office for clarification of your results.   Dr Carlean Purl recommends moist heat for your muscular skeletal chest pain.   I appreciate the opportunity to care for you. Silvano Rusk, MD, [facility]

## 2020-03-09 DIAGNOSIS — M47816 Spondylosis without myelopathy or radiculopathy, lumbar region: Secondary | ICD-10-CM | POA: Diagnosis not present

## 2020-03-23 ENCOUNTER — Encounter: Payer: Self-pay | Admitting: Internal Medicine

## 2020-03-23 ENCOUNTER — Other Ambulatory Visit: Payer: Self-pay

## 2020-03-23 ENCOUNTER — Ambulatory Visit (INDEPENDENT_AMBULATORY_CARE_PROVIDER_SITE_OTHER): Payer: Managed Care, Other (non HMO) | Admitting: Internal Medicine

## 2020-03-23 ENCOUNTER — Other Ambulatory Visit: Payer: Self-pay | Admitting: Internal Medicine

## 2020-03-23 VITALS — BP 144/90 | HR 62 | Ht 67.0 in | Wt 187.2 lb

## 2020-03-23 DIAGNOSIS — I10 Essential (primary) hypertension: Secondary | ICD-10-CM

## 2020-03-23 DIAGNOSIS — Z1159 Encounter for screening for other viral diseases: Secondary | ICD-10-CM | POA: Diagnosis not present

## 2020-03-23 DIAGNOSIS — E785 Hyperlipidemia, unspecified: Secondary | ICD-10-CM | POA: Diagnosis not present

## 2020-03-23 DIAGNOSIS — I25118 Atherosclerotic heart disease of native coronary artery with other forms of angina pectoris: Secondary | ICD-10-CM | POA: Diagnosis not present

## 2020-03-23 MED ORDER — ISOSORBIDE MONONITRATE ER 60 MG PO TB24: 60.0000 mg | ORAL_TABLET | Freq: Every day | ORAL | 3 refills | Status: DC

## 2020-03-23 NOTE — Patient Instructions (Signed)
Medication Instructions:  Your physician has recommended you make the following change in your medication:  1- INCREASE Imdur to 60 mg by  Mouth daily.  *If you need a refill on your cardiac medications before your next appointment, please call your pharmacy*  Follow-Up: At [facility], you and your health needs are our priority.  As part of our continuing mission to provide you with exceptional heart care, we have created designated Provider Care Teams.  These Care Teams include your primary Cardiologist (physician) and Advanced Practice Providers (APPs -  Physician Assistants and Nurse Practitioners) who all work together to provide you with the care you need, when you need it.  We recommend signing up for the patient portal called "MyChart".  Sign up information is provided on this After Visit Summary.  MyChart is used to connect with patients for Virtual Visits (Telemedicine).  Patients are able to view lab/test results, encounter notes, upcoming appointments, etc.  Non-urgent messages can be sent to your provider as well.   To learn more about what you can do with MyChart, go to [email or web address].    Your next appointment:   6 month(s)  The format for your next appointment:   In Person  Provider:   You may see Nelva Bush, MD or one of the following Advanced Practice Providers on your designated Care Team:    Murray Hodgkins, NP  Christell Faith, PA-C  Marrianne Mood, PA-C  Cadence [place], [place]  Laurann Montana, NP

## 2020-03-23 NOTE — Progress Notes (Signed)
Follow-up Outpatient Visit Date: 03/23/2020  Primary Care Provider: Tonia Ghent, MD [place] [id number]  Chief Complaint: Follow-up coronary artery disease  HPI:  Billy Kim is a 75 y.o. male with history of coronary artery disease being managed medically (chronic total occlusions of D1 and nondominant RCA as well as moderate proximal LAD disease that was not significant by iFR/FFR), hypertension, and ulcerative colitis, who presents for follow-up of coronary artery disease.  I last saw him in late October shortly after preceding catheterization.  He reported resolution of chest pain after initiation of carvedilol and isosorbide mononitrate.  We agreed to increase isosorbide mononitrate to 30 mg daily.  Today, Billy Kim reports that he continues to feel better with escalation of isosorbide mononitrate though he still has some upper chest discomfort when he does very strenuous activities.  It is a sharp pain that resolves promptly with rest.  He denies associated symptoms.  He has not had any shortness of breath, palpitations, edema, or lightheadedness.  He reports rare headaches that are self-limited.  He also has epistaxis about once a month and wonders if aspirin could be worsening this.  --------------------------------------------------------------------------------------------------  Past Medical History:  Diagnosis Date  . Coronary artery disease 01/2020   Moderate proximal LAD disease (not hemodynamically significant) and CTO's of D1 and non-dominant RCA  . HLD (hyperlipidemia)   . Hypertension   . Ulcerative colitis 5/81   Remission for years   Past Surgical History:  Procedure Laterality Date  . CARDIAC CATHETERIZATION  03/20/01   Cardiolite EF 55% 02/10/02  . CHOLECYSTECTOMY N/A 07/09/2017   Procedure: LAPAROSCOPIC CHOLECYSTECTOMY;  Surgeon: Judeth Horn, MD;  Location: [place];  Service: General;  Laterality: N/A;  . COLONOSCOPY  multiple  .  ENDOSCOPIC RETROGRADE CHOLANGIOPANCREATOGRAPHY (ERCP) WITH PROPOFOL N/A 07/08/2017   Procedure: ENDOSCOPIC RETROGRADE CHOLANGIOPANCREATOGRAPHY (ERCP) WITH PROPOFOL;  Surgeon: Jackquline Denmark, MD;  Location: [facility] ENDOSCOPY;  Service: Endoscopy;  Laterality: N/A;  . INGUINAL HERNIA REPAIR  04/09/06   Bilateral  . LAPAROSCOPIC APPENDECTOMY  03/1981  . LEFT HEART CATH AND CORONARY ANGIOGRAPHY Left 12/11/2019   Procedure: LEFT HEART CATH AND CORONARY ANGIOGRAPHY;  Surgeon: Nelva Bush, MD;  Location: [place] CV LAB;  Service: Cardiovascular;  Laterality: Left;  . REMOVAL OF STONES  07/08/2017   Procedure: REMOVAL OF STONES;  Surgeon: Jackquline Denmark, MD;  Location: [facility] ENDOSCOPY;  Service: Endoscopy;;  . Joan Mayans  07/08/2017   Procedure: Joan Mayans;  Surgeon: Jackquline Denmark, MD;  Location: [facility] ENDOSCOPY;  Service: Endoscopy;;    Current Meds  Medication Sig  . Ascorbic Acid (VITAMIN C) 1000 MG tablet Take 1,000 mg by mouth daily.  Marland Kitchen aspirin EC 81 MG tablet Take 1 tablet (81 mg total) by mouth daily. Swallow whole.  . carvedilol (COREG) 3.125 MG tablet Take 1 tablet (3.125 mg total) by mouth 2 (two) times daily.  . cholecalciferol (VITAMIN D3) 25 MCG (1000 UNIT) tablet Take 2,000 Units by mouth daily.   . Coenzyme Q10 100 MG capsule Take 100 mg by mouth daily.  . famotidine (PEPCID) 20 MG tablet Take 1 tablet (20 mg total) by mouth 2 (two) times daily as needed for heartburn or indigestion.  . Garlic 951 MG TABS Take 500 mg by mouth daily.  . isosorbide mononitrate (IMDUR) 30 MG 24 hr tablet Take 1 tablet (30 mg total) by mouth daily.  Marland Kitchen losartan (COZAAR) 25 MG tablet TAKE 1 TABLET(25 MG) BY MOUTH DAILY  . nitroGLYCERIN (NITROSTAT)  0.4 MG SL tablet Place 1 tablet (0.4 mg total) under the tongue every 5 (five) minutes as needed for chest pain. Maximum of 3 doses.  . rosuvastatin (CRESTOR) 5 MG tablet Take 1 tablet (5 mg total) by mouth daily.    Allergies: Amoxicillin, Lyrica  [pregabalin], Morphine sulfate, Neurontin [gabapentin], and Sulfonamide derivatives  Social History   Tobacco Use  . Smoking status: Never Smoker  . Smokeless tobacco: Never Used  Vaping Use  . Vaping Use: Never used  Substance Use Topics  . Alcohol use: No  . Drug use: No    Family History  Problem Relation Age of Onset  . Cancer Mother        ? GYN, died after hysterectomy  . Stroke Father   . CAD Father   . Prostate cancer Brother   . CAD Brother 20       CABG  . Bladder Cancer Brother 70       Bladder  . Hypertension Brother   . Hypertension Sister   . Hypertension Brother   . Hypertension Brother   . Hypertension Brother   . Hypertension Brother   . Bone cancer Paternal Grandfather   . Prostate cancer Nephew   . Bladder Cancer Nephew   . Hypertension Son   . Colon cancer Neg Hx   . Esophageal cancer Neg Hx   . Rectal cancer Neg Hx   . Stomach cancer Neg Hx     Review of Systems: A 12-system review of systems was performed and was negative except as noted in the HPI.  --------------------------------------------------------------------------------------------------  Physical Exam: BP (!) 144/90 (BP Location: Left Arm, Patient Position: Sitting, Cuff Size: Normal)   Pulse 62   Ht 5\' 7"  (1.702 m)   Wt 187 lb 4 oz (84.9 kg)   SpO2 98%   BMI 29.33 kg/m   Repeat BP 122/70  General:  NAD. Neck: No JVD or HJR. Lungs: Clear to auscultation bilaterally without wheezes or crackles. Heart: Regular rate and rhythm without murmurs, rubs, or gallops. Abdomen: Soft, nontender, nondistended. Extremities: No lower extremity edema.  EKG: Normal sinus rhythm with RBBB and borderline LVH.  No significant change from prior tracing on 12/17/2019.  Lab Results  Component Value Date   WBC 6.2 12/08/2019   HGB 16.1 12/08/2019   HCT 44.8 12/08/2019   MCV 86 12/08/2019   PLT 131 (L) 12/08/2019    Lab Results  Component Value Date   NA 144 12/08/2019   K 4.7  12/08/2019   CL 106 12/08/2019   CO2 26 12/08/2019   BUN 13 12/08/2019   CREATININE 1.14 12/08/2019   GLUCOSE 95 12/08/2019   ALT 16 04/29/2019    Lab Results  Component Value Date   CHOL 124 10/01/2019   HDL 23 (L) 10/01/2019   LDLCALC 56 10/01/2019   LDLDIRECT 64.2 12/29/2008   TRIG 224 (H) 10/01/2019   CHOLHDL 5.4 10/01/2019    --------------------------------------------------------------------------------------------------  ASSESSMENT AND PLAN: Coronary artery disease with stable angina: Billy Kim reports improvement in exertional chest pain with escalation of isosorbide mononitrate, though it is still intermittently present with strenuous activity consistent with CCS class II angina.  As he is tolerating isosorbide mononitrate well, we have agreed to increase the dose to 60 mg daily.  We will continue carvedilol 3.125 mg twice daily; resting heart rate in the low 60s precludes further escalation.  We will plan to continue aspirin and statin therapy to prevent progression of disease.  Hypertension:  Blood pressure remains suboptimally controlled today.  We will increase isosorbide mononitrate to 60 mg daily and continue current doses of carvedilol and losartan  Hyperlipidemia: LDL well controlled on last check.  Continue rosuvastatin 5 mg daily.  Follow-up: Return to clinic in 6 months.  Nelva Bush, MD 03/23/2020 9:07 AM

## 2020-03-24 LAB — SARS CORONAVIRUS 2 (TAT 6-24 HRS): SARS Coronavirus 2: NEGATIVE

## 2020-03-25 ENCOUNTER — Encounter: Payer: Self-pay | Admitting: Internal Medicine

## 2020-03-25 ENCOUNTER — Other Ambulatory Visit: Payer: Self-pay

## 2020-03-25 ENCOUNTER — Ambulatory Visit (AMBULATORY_SURGERY_CENTER): Payer: Managed Care, Other (non HMO) | Admitting: Internal Medicine

## 2020-03-25 VITALS — BP 142/83 | HR 60 | Temp 97.5°F | Resp 18 | Ht 67.0 in | Wt 187.0 lb

## 2020-03-25 DIAGNOSIS — K2970 Gastritis, unspecified, without bleeding: Secondary | ICD-10-CM | POA: Diagnosis not present

## 2020-03-25 DIAGNOSIS — K222 Esophageal obstruction: Secondary | ICD-10-CM | POA: Diagnosis not present

## 2020-03-25 DIAGNOSIS — K449 Diaphragmatic hernia without obstruction or gangrene: Secondary | ICD-10-CM | POA: Diagnosis not present

## 2020-03-25 DIAGNOSIS — K2990 Gastroduodenitis, unspecified, without bleeding: Secondary | ICD-10-CM

## 2020-03-25 DIAGNOSIS — R1310 Dysphagia, unspecified: Secondary | ICD-10-CM | POA: Diagnosis not present

## 2020-03-25 MED ORDER — OMEPRAZOLE 40 MG PO CPDR: 40.0000 mg | DELAYED_RELEASE_CAPSULE | Freq: Every day | ORAL | 3 refills | Status: DC

## 2020-03-25 MED ORDER — SODIUM CHLORIDE 0.9 % IV SOLN: 500.0000 mL | Freq: Once | INTRAVENOUS | Status: DC

## 2020-03-25 NOTE — Progress Notes (Signed)
To PACU, VSS. Report to [email or web address] 

## 2020-03-25 NOTE — Progress Notes (Signed)
Vitals by NS.  BP 177/100.  BP reported to T. Gwenlyn Found, Immunologist.  No orders received.

## 2020-03-25 NOTE — Op Note (Signed)
[place] Patient Name: Billy Kim Procedure Date: 03/25/2020 10:03 AM MRN: [id number] Endoscopist: Gatha Mayer , MD Age: 75 Referring MD:  Date of Birth: 1945-05-01 Gender: Male Account #: [id number] Procedure:                Upper GI endoscopy Indications:              Dysphagia Medicines:                Propofol per Anesthesia, Monitored Anesthesia Care Procedure:                Pre-Anesthesia Assessment:                           - Prior to the procedure, a History and Physical                            was performed, and patient medications and                            allergies were reviewed. The patient's tolerance of                            previous anesthesia was also reviewed. The risks                            and benefits of the procedure and the sedation                            options and risks were discussed with the patient.                            All questions were answered, and informed consent                            was obtained. Prior Anticoagulants: The patient has                            taken no previous anticoagulant or antiplatelet                            agents. ASA Grade Assessment: II - A patient with                            mild systemic disease. After reviewing the risks                            and benefits, the patient was deemed in                            satisfactory condition to undergo the procedure.                           After obtaining informed consent, the endoscope was  passed under direct vision. Throughout the                            procedure, the patient's blood pressure, pulse, and                            oxygen saturations were monitored continuously. The                            Endoscope was introduced through the mouth, and                            advanced to the second part of duodenum. The upper                            GI endoscopy was  accomplished without difficulty.                            The patient tolerated the procedure well. Scope In: Scope Out: Findings:                 One benign-appearing, intrinsic moderate                            (circumferential scarring or stenosis; an endoscope                            may pass) stenosis was found at the                            gastroesophageal junction. This stenosis measured                            1.6 cm (inner diameter). The stenosis was                            traversed. A TTS dilator was passed through the                            scope. Dilation with an 18-19-20 mm balloon dilator                            was performed to 20 mm. The dilation site was                            examined and showed moderate mucosal disruption and                            moderate improvement in luminal narrowing.                            Estimated blood loss was minimal.                           A 4  cm hiatal hernia was present.                           Patchy moderate inflammation characterized by                            erythema and mucus was found in the gastric antrum.                            Biopsies were taken with a cold forceps for                            Helicobacter pylori testing using CLOtest.                            Verification of patient identification for the                            specimen was done. Estimated blood loss was minimal.                           Patchy mild inflammation characterized by erosions                            and erythema was found in the second portion of the                            duodenum.                           The gastroesophageal flap valve was visualized                            endoscopically and classified as Hill Grade IV (no                            fold, wide open lumen, hiatal hernia present).                           The exam was otherwise without  abnormality. Complications:            No immediate complications. Estimated Blood Loss:     Estimated blood loss was minimal. Impression:               - Benign-appearing esophageal stenosis. Dilated.                           - 4 cm hiatal hernia.                           - Gastritis. Biopsied.                           - Duodenitis.                           -  Gastroesophageal flap valve classified as Hill                            Grade IV (no fold, wide open lumen, hiatal hernia                            present).                           - The examination was otherwise normal. Recommendation:           - Patient has a contact number available for                            emergencies. The signs and symptoms of potential                            delayed complications were discussed with the                            patient. Return to normal activities tomorrow.                            Written discharge instructions were provided to the                            patient.                           - Continue present medications.                           - Clear liquids x 1 hour then soft foods rest of                            day. Start prior diet tomorrow.                           - Use Prilosec (omeprazole) 40 mg PO daily.                           - Follow an antireflux regimen.                           Follow an antireflux regimen. This includes:                           - Do not lie down for at least 3 to 4 hours after                            meals.                           - Raise the head of the bed 4 to 6 inches.                           -  Decrease excess weight.                           - Avoid citrus juices and other acidic foods,                            alcohol, chocolate, mints, coffee and other                            caffeinated beverages, carbonated beverages, fatty                            and fried foods.                           -  Avoid tight-fitting clothing.                           - Avoid cigarettes and other tobacco products. Gatha Mayer, MD 03/25/2020 10:44:59 AM This report has been signed electronically.

## 2020-03-25 NOTE — Progress Notes (Signed)
Called to room to assist during endoscopic procedure.  Patient ID and intended procedure confirmed with present staff. Received instructions for my participation in the procedure from the performing physician.  

## 2020-03-25 NOTE — Patient Instructions (Addendum)
There was a stricture that I dilated. You will swallow better now. I have prescribed an acid blocker called omeprazole to help also. Biopsies of stomach to look for an infection that could cause some inflammation I saw in stomach and upper intestine.  Follow an antireflux regimen.  This includes:      - Do not lie down for at least 3 to 4 hours after meals.       - Raise the head of the bed 4 to 6 inches.       - Decrease excess weight.       - Avoid citrus juices and other acidic foods, alcohol, chocolate, mints, coffee and other caffeinated beverages, carbonated beverages, fatty and fried foods.       - Avoid tight-fitting clothing.       - Avoid cigarettes and other tobacco products.    I appreciate the opportunity to care for you. Gatha Mayer, MD, FACG  YOU HAD AN ENDOSCOPIC PROCEDURE TODAY AT [facility]:   Refer to the procedure report that was given to you for any specific questions about what was found during the examination.  If the procedure report does not answer your questions, please call your gastroenterologist to clarify.  If you requested that your care partner not be given the details of your procedure findings, then the procedure report has been included in a sealed envelope for you to review at your convenience later.  YOU SHOULD EXPECT: Some feelings of bloating in the abdomen. Passage of more gas than usual.  Walking can help get rid of the air that was put into your GI tract during the procedure and reduce the bloating. If you had a lower endoscopy (such as a colonoscopy or flexible sigmoidoscopy) you may notice spotting of blood in your stool or on the toilet paper. If you underwent a bowel prep for your procedure, you may not have a normal bowel movement for a few days.  Please Note:  You might notice some irritation and congestion in your nose or some drainage.  This is from the oxygen used during your procedure.  There is no need for concern and it  should clear up in a day or so.  SYMPTOMS TO REPORT IMMEDIATELY:   Following upper endoscopy (EGD)  Vomiting of blood or coffee ground material  New chest pain or pain under the shoulder blades  Painful or persistently difficult swallowing  New shortness of breath  Fever of 100F or higher  Black, tarry-looking stools  For urgent or emergent issues, a gastroenterologist can be reached at any hour by calling [phone]. Do not use MyChart messaging for urgent concerns.    DIET:  We do recommend a small meal at first, but then you may proceed to your regular diet.  Drink plenty of fluids but you should avoid alcoholic beverages for 24 hours.  ACTIVITY:  You should plan to take it easy for the rest of today and you should NOT DRIVE or use heavy machinery until tomorrow (because of the sedation medicines used during the test).    FOLLOW UP: Our staff will call the number listed on your records 48-72 hours following your procedure to check on you and address any questions or concerns that you may have regarding the information given to you following your procedure. If we do not reach you, we will leave a message.  We will attempt to reach you two times.  During this call, we will  ask if you have developed any symptoms of COVID 19. If you develop any symptoms (ie: fever, flu-like symptoms, shortness of breath, cough etc.) before then, please call [phone].  If you test positive for Covid 19 in the 2 weeks post procedure, please call and report this information to [place].    If any biopsies were taken you will be contacted by phone or by letter within the next 1-3 weeks.  Please call us at [phone] if you have not heard about the biopsies in 3 weeks.    SIGNATURES/CONFIDENTIALITY: You and/or your care partner have signed paperwork which will be entered into your electronic medical record.  These signatures attest to the fact that that the information above on your After Visit Summary  has been reviewed and is understood.  Full responsibility of the confidentiality of this discharge information lies with you and/or your care-partner.

## 2020-03-28 LAB — HELICOBACTER PYLORI SCREEN-BIOPSY: UREASE: NEGATIVE

## 2020-03-29 ENCOUNTER — Telehealth: Payer: Self-pay | Admitting: *Deleted

## 2020-03-29 NOTE — Telephone Encounter (Signed)
  Follow up Call-  Call back number 03/25/2020  Post procedure Call Back phone  # [phone]  Permission to leave phone message Yes  Some recent data might be hidden     Patient questions:  Do you have a fever, pain , or abdominal swelling? No. Pain Score  0 *  Have you tolerated food without any problems? Yes.    Have you been able to return to your normal activities? Yes.    Do you have any questions about your discharge instructions: Diet   No. Medications  No. Follow up visit  No.  Do you have questions or concerns about your Care? No.  Actions: * If pain score is 4 or above: 1. No action needed, pain <4.Have you developed a fever since your procedure? no  2.   Have you had an respiratory symptoms (SOB or cough) since your procedure? no  3.   Have you tested positive for COVID 19 since your procedure no  4.   Have you had any family members/close contacts diagnosed with the COVID 19 since your procedure?  no   If yes to any of these questions please route to Joylene John, RN and Joella Prince, RN

## 2020-05-28 ENCOUNTER — Other Ambulatory Visit: Payer: Self-pay | Admitting: Family Medicine

## 2020-06-05 ENCOUNTER — Other Ambulatory Visit: Payer: Self-pay | Admitting: Family Medicine

## 2020-06-06 NOTE — Telephone Encounter (Signed)
Pharmacy requests refill on: Carvedilol 3.125 mg   LAST REFILL: 05/01/2019 (Q-180, R-3) LAST OV: 12/08/2019 NEXT OV: Not Scheduled  PHARMACY: [id number] [place], [place]

## 2020-06-30 ENCOUNTER — Encounter: Payer: Self-pay | Admitting: Family Medicine

## 2020-06-30 ENCOUNTER — Other Ambulatory Visit: Payer: Self-pay

## 2020-06-30 ENCOUNTER — Telehealth (INDEPENDENT_AMBULATORY_CARE_PROVIDER_SITE_OTHER): Payer: Managed Care, Other (non HMO) | Admitting: Family Medicine

## 2020-06-30 VITALS — Ht 67.0 in | Wt 180.0 lb

## 2020-06-30 DIAGNOSIS — R059 Cough, unspecified: Secondary | ICD-10-CM | POA: Diagnosis not present

## 2020-06-30 DIAGNOSIS — J0140 Acute pansinusitis, unspecified: Secondary | ICD-10-CM | POA: Diagnosis not present

## 2020-06-30 MED ORDER — BENZONATATE 100 MG PO CAPS: 100.0000 mg | ORAL_CAPSULE | Freq: Three times a day (TID) | ORAL | 0 refills | Status: DC | PRN

## 2020-06-30 MED ORDER — DOXYCYCLINE HYCLATE 100 MG PO TABS: 100.0000 mg | ORAL_TABLET | Freq: Two times a day (BID) | ORAL | 0 refills | Status: AC

## 2020-06-30 NOTE — Progress Notes (Signed)
I connected with Billy Kim on 06/30/20 at 11:20 AM EDT by video and verified that I am speaking with the correct person using two identifiers.   I discussed the limitations, risks, security and privacy concerns of performing an evaluation and management service by video and the availability of in person appointments. I also discussed with the patient that there may be a patient responsible charge related to this service. The patient expressed understanding and agreed to proceed.  Patient location: Home Provider Location: [place] Participants: Lesleigh Noe and [place]   Subjective:     Billy Kim is a 75 y.o. male presenting for Nasal Congestion and Cough (Dry x 5-6 days )     Cough The cough is non-productive. Pertinent negatives include no chills, ear pain, headaches, postnasal drip, rhinorrhea, sore throat, shortness of breath or wheezing. Associated symptoms comments: Chest congestion.  Sinusitis This is a new problem. The current episode started in the past 7 days. The problem has been gradually worsening since onset. There has been no fever. Associated symptoms include congestion, coughing and sneezing. Pertinent negatives include no chills, ear pain, headaches, shortness of breath, sinus pressure, sore throat or swollen glands. Treatments tried: afrin, xyzal  The treatment provided moderate relief.   Sick contact: no Loss of taste or smell: no Took 2 covid tests which were negative  Review of Systems  Constitutional: Negative for chills.  HENT: Positive for congestion and sneezing. Negative for ear pain, postnasal drip, rhinorrhea, sinus pressure and sore throat.   Respiratory: Positive for cough. Negative for shortness of breath and wheezing.   Neurological: Negative for headaches.     Social History   Tobacco Use  Smoking Status Never Smoker  Smokeless Tobacco Never Used        Objective:   BP Readings from Last 3 Encounters:   03/25/20 (!) 142/83  03/23/20 (!) 144/90  02/10/20 130/86   Wt Readings from Last 3 Encounters:  06/30/20 180 lb (81.6 kg)  03/25/20 187 lb (84.8 kg)  03/23/20 187 lb 4 oz (84.9 kg)    Ht 5\' 7"  (1.702 m)   Wt 180 lb (81.6 kg)   BMI 28.19 kg/m    Physical Exam Constitutional:      Appearance: Normal appearance. He is not ill-appearing.  HENT:     Head: Normocephalic and atraumatic.     Right Ear: External ear normal.     Left Ear: External ear normal.  Eyes:     Conjunctiva/sclera: Conjunctivae normal.  Pulmonary:     Effort: Pulmonary effort is normal. No respiratory distress.  Neurological:     Mental Status: He is alert. Mental status is at baseline.  Psychiatric:        Mood and Affect: Mood normal.        Behavior: Behavior normal.        Thought Content: Thought content normal.        Judgment: Judgment normal.            Assessment & Plan:   Problem List Items Addressed This Visit   None   Visit Diagnoses    Acute non-recurrent pansinusitis    -  Primary   Relevant Medications   doxycycline (VIBRA-TABS) 100 MG tablet   benzonatate (TESSALON PERLES) 100 MG capsule   Cough       Relevant Medications   benzonatate (TESSALON PERLES) 100 MG capsule     OTC care Advised stopping  affrin and switching to flonase to avoid rebound Cont xyzal Tessalon for cough Abx if not improved in 1-2 days  Return if symptoms worsen or fail to improve.  Lesleigh Noe, MD

## 2020-08-04 ENCOUNTER — Other Ambulatory Visit: Payer: Self-pay

## 2020-08-04 ENCOUNTER — Ambulatory Visit (INDEPENDENT_AMBULATORY_CARE_PROVIDER_SITE_OTHER): Payer: Managed Care, Other (non HMO)

## 2020-08-04 DIAGNOSIS — Z0000 Encounter for general adult medical examination without abnormal findings: Secondary | ICD-10-CM | POA: Diagnosis not present

## 2020-08-04 NOTE — Patient Instructions (Signed)
[name] , Thank you for taking time to come for your Medicare Wellness Visit. I appreciate your ongoing commitment to your health goals. Please review the following plan we discussed and let me know if I can assist you in the future.   Screening recommendations/referrals: Colonoscopy: no longer required  Recommended yearly ophthalmology/optometry visit for glaucoma screening and checkup Recommended yearly dental visit for hygiene and checkup  Vaccinations: Influenza vaccine: Up to date, completed 12/08/2019, due 09/2020 Pneumococcal vaccine: due, will get at next office visit  Tdap vaccine: decline-insurance Shingles vaccine: due, check with your insurance regarding coverage if interested    Covid-19: declined   Advanced directives: Please bring a copy of your POA (Power of [place]) and/or Living Will to your next appointment.   Conditions/risks identified: hypertension, hyperlipidemia  Next appointment: Follow up in one year for your annual wellness visit.   Preventive Care 76 Years and Older, Male Preventive care refers to lifestyle choices and visits with your health care provider that can promote health and wellness. What does preventive care include? A yearly physical exam. This is also called an annual well check. Dental exams once or twice a year. Routine eye exams. Ask your health care provider how often you should have your eyes checked. Personal lifestyle choices, including: Daily care of your teeth and gums. Regular physical activity. Eating a healthy diet. Avoiding tobacco and drug use. Limiting alcohol use. Practicing safe sex. Taking low doses of aspirin every day. Taking vitamin and mineral supplements as recommended by your health care provider. What happens during an annual well check? The services and screenings done by your health care provider during your annual well check will depend on your age, overall health, lifestyle risk factors, and family history of  disease. Counseling  Your health care provider may ask you questions about your: Alcohol use. Tobacco use. Drug use. Emotional well-being. Home and relationship well-being. Sexual activity. Eating habits. History of falls. Memory and ability to understand (cognition). Work and work Statistician. Screening  You may have the following tests or measurements: Height, weight, and BMI. Blood pressure. Lipid and cholesterol levels. These may be checked every 5 years, or more frequently if you are over 64 years old. Skin check. Lung cancer screening. You may have this screening every year starting at age 57 if you have a 30-pack-year history of smoking and currently smoke or have quit within the past 15 years. Fecal occult blood test (FOBT) of the stool. You may have this test every year starting at age 13. Flexible sigmoidoscopy or colonoscopy. You may have a sigmoidoscopy every 5 years or a colonoscopy every 10 years starting at age 50. Prostate cancer screening. Recommendations will vary depending on your family history and other risks. Hepatitis C blood test. Hepatitis B blood test. Sexually transmitted disease (STD) testing. Diabetes screening. This is done by checking your blood sugar (glucose) after you have not eaten for a while (fasting). You may have this done every 1-3 years. Abdominal aortic aneurysm (AAA) screening. You may need this if you are a current or former smoker. Osteoporosis. You may be screened starting at age 64 if you are at high risk. Talk with your health care provider about your test results, treatment options, and if necessary, the need for more tests. Vaccines  Your health care provider may recommend certain vaccines, such as: Influenza vaccine. This is recommended every year. Tetanus, diphtheria, and acellular pertussis (Tdap, Td) vaccine. You may need a Td booster every 10 years.  Zoster vaccine. You may need this after age 60. Pneumococcal 13-valent  conjugate (PCV13) vaccine. One dose is recommended after age 65. Pneumococcal polysaccharide (PPSV23) vaccine. One dose is recommended after age 76. Talk to your health care provider about which screenings and vaccines you need and how often you need them. This information is not intended to replace advice given to you by your health care provider. Make sure you discuss any questions you have with your health care provider. Document Released: 03/04/2015 Document Revised: 10/26/2015 Document Reviewed: 12/07/2014 Elsevier Interactive Patient Education  2017 [place] Prevention in the Home Falls can cause injuries. They can happen to people of all ages. There are many things you can do to make your home safe and to help prevent falls. What can I do on the outside of my home? Regularly fix the edges of walkways and driveways and fix any cracks. Remove anything that might make you trip as you walk through a door, such as a raised step or threshold. Trim any bushes or trees on the path to your home. Use bright outdoor lighting. Clear any walking paths of anything that might make someone trip, such as rocks or tools. Regularly check to see if handrails are loose or broken. Make sure that both sides of any steps have handrails. Any raised decks and porches should have guardrails on the edges. Have any leaves, snow, or ice cleared regularly. Use sand or salt on walking paths during winter. Clean up any spills in your garage right away. This includes oil or grease spills. What can I do in the bathroom? Use night lights. Install grab bars by the toilet and in the tub and shower. Do not use towel bars as grab bars. Use non-skid mats or decals in the tub or shower. If you need to sit down in the shower, use a plastic, non-slip stool. Keep the floor dry. Clean up any water that spills on the floor as soon as it happens. Remove soap buildup in the tub or shower regularly. Attach bath mats  securely with double-sided non-slip rug tape. Do not have throw rugs and other things on the floor that can make you trip. What can I do in the bedroom? Use night lights. Make sure that you have a light by your bed that is easy to reach. Do not use any sheets or blankets that are too big for your bed. They should not hang down onto the floor. Have a firm chair that has side arms. You can use this for support while you get dressed. Do not have throw rugs and other things on the floor that can make you trip. What can I do in the kitchen? Clean up any spills right away. Avoid walking on wet floors. Keep items that you use a lot in easy-to-reach places. If you need to reach something above you, use a strong step stool that has a grab bar. Keep electrical cords out of the way. Do not use floor polish or wax that makes floors slippery. If you must use wax, use non-skid floor wax. Do not have throw rugs and other things on the floor that can make you trip. What can I do with my stairs? Do not leave any items on the stairs. Make sure that there are handrails on both sides of the stairs and use them. Fix handrails that are broken or loose. Make sure that handrails are as long as the stairways. Check any carpeting to make sure that  it is firmly attached to the stairs. Fix any carpet that is loose or worn. Avoid having throw rugs at the top or bottom of the stairs. If you do have throw rugs, attach them to the floor with carpet tape. Make sure that you have a light switch at the top of the stairs and the bottom of the stairs. If you do not have them, ask someone to add them for you. What else can I do to help prevent falls? Wear shoes that: Do not have high heels. Have rubber bottoms. Are comfortable and fit you well. Are closed at the toe. Do not wear sandals. If you use a stepladder: Make sure that it is fully opened. Do not climb a closed stepladder. Make sure that both sides of the stepladder  are locked into place. Ask someone to hold it for you, if possible. Clearly mark and make sure that you can see: Any grab bars or handrails. First and last steps. Where the edge of each step is. Use tools that help you move around (mobility aids) if they are needed. These include: Canes. Walkers. Scooters. Crutches. Turn on the lights when you go into a dark area. Replace any light bulbs as soon as they burn out. Set up your furniture so you have a clear path. Avoid moving your furniture around. If any of your floors are uneven, fix them. If there are any pets around you, be aware of where they are. Review your medicines with your doctor. Some medicines can make you feel dizzy. This can increase your chance of falling. Ask your doctor what other things that you can do to help prevent falls. This information is not intended to replace advice given to you by your health care provider. Make sure you discuss any questions you have with your health care provider. Document Released: 12/02/2008 Document Revised: 07/14/2015 Document Reviewed: 03/12/2014 Elsevier Interactive Patient Education  2017 [employer].

## 2020-08-04 NOTE — Progress Notes (Signed)
PCP notes:  Health Maintenance: Prevnar- due Tdap- insurance Covid- declined Shingrix- due   Abnormal Screenings: none   Patient concerns: none   Nurse concerns: none   Next PCP appt.: none

## 2020-08-04 NOTE — Progress Notes (Signed)
Subjective:   Billy Kim is a 75 y.o. male who presents for Medicare Annual/Subsequent preventive examination.  Review of Systems: N/A       I connected with the patient today by telephone and verified that I am speaking with the correct person using two identifiers. Location patient: home Location nurse: work Persons participating in the telephone visit: patient, nurse.   I discussed the limitations, risks, security and privacy concerns of performing an evaluation and management service by telephone and the availability of in person appointments. I also discussed with the patient that there may be a patient responsible charge related to this service. The patient expressed understanding and verbally consented to this telephonic visit.              Cardiac Risk Factors include: advanced age (>93men, >50 women);hypertension;male gender;Other (see comment), Risk factor comments: hyperlipidemia     Objective:    Today's Vitals   There is no height or weight on file to calculate BMI.  Advanced Directives 08/04/2020 12/11/2019 07/08/2017 07/06/2017  Does Patient Have a Medical Advance Directive? Yes - No No  Type of Paramedic of [place];Living will - - -  Does patient want to make changes to medical advance directive? - No - Patient declined - -  Copy of [place] in Chart? No - copy requested - - -  Would patient like information on creating a medical advance directive? - - No - Patient declined No - Patient declined    Current Medications (verified) Outpatient Encounter Medications as of 08/04/2020  Medication Sig   Ascorbic Acid (VITAMIN C) 1000 MG tablet Take 1,000 mg by mouth daily.   aspirin EC 81 MG tablet Take 1 tablet (81 mg total) by mouth daily. Swallow whole.   benzonatate (TESSALON PERLES) 100 MG capsule Take 1 capsule (100 mg total) by mouth 3 (three) times daily as needed for cough.   carvedilol (COREG) 3.125 MG tablet  TAKE 1 TABLET(3.125 MG) BY MOUTH TWICE DAILY   cholecalciferol (VITAMIN D3) 25 MCG (1000 UNIT) tablet Take 2,000 Units by mouth daily.    Coenzyme Q10 100 MG capsule Take 100 mg by mouth daily.   famotidine (PEPCID) 20 MG tablet Take 1 tablet (20 mg total) by mouth 2 (two) times daily as needed for heartburn or indigestion.   Garlic 361 MG TABS Take 500 mg by mouth daily.   losartan (COZAAR) 25 MG tablet TAKE 1 TABLET(25 MG) BY MOUTH DAILY   nitroGLYCERIN (NITROSTAT) 0.4 MG SL tablet Place 1 tablet (0.4 mg total) under the tongue every 5 (five) minutes as needed for chest pain. Maximum of 3 doses.   omeprazole (PRILOSEC) 40 MG capsule Take 1 capsule (40 mg total) by mouth daily before breakfast.   rosuvastatin (CRESTOR) 5 MG tablet Take 1 tablet (5 mg total) by mouth daily.   isosorbide mononitrate (IMDUR) 60 MG 24 hr tablet Take 1 tablet (60 mg total) by mouth daily.   No facility-administered encounter medications on file as of 08/04/2020.    Allergies (verified) Amoxicillin, Lyrica [pregabalin], Morphine sulfate, Neurontin [gabapentin], and Sulfonamide derivatives   History: Past Medical History:  Diagnosis Date   Coronary artery disease 01/2020   Moderate proximal LAD disease (not hemodynamically significant) and CTO's of D1 and non-dominant RCA   HLD (hyperlipidemia)    Hypertension    Ulcerative colitis 5/81   Remission for years   Past Surgical History:  Procedure Laterality Date   CARDIAC  CATHETERIZATION  03/20/01   Cardiolite EF 55% 02/10/02   CHOLECYSTECTOMY N/A 07/09/2017   Procedure: LAPAROSCOPIC CHOLECYSTECTOMY;  Surgeon: Judeth Horn, MD;  Location: [facility];  Service: General;  Laterality: N/A;   COLONOSCOPY  multiple   ENDOSCOPIC RETROGRADE CHOLANGIOPANCREATOGRAPHY (ERCP) WITH PROPOFOL N/A 07/08/2017   Procedure: ENDOSCOPIC RETROGRADE CHOLANGIOPANCREATOGRAPHY (ERCP) WITH PROPOFOL;  Surgeon: Jackquline Denmark, MD;  Location[facility] ENDOSCOPY;  Service: Endoscopy;  Laterality: N/A;    INGUINAL HERNIA REPAIR  04/09/06   Bilateral   LAPAROSCOPIC APPENDECTOMY  03/1981   LEFT HEART CATH AND CORONARY ANGIOGRAPHY Left 12/11/2019   Procedure: LEFT HEART CATH AND CORONARY ANGIOGRAPHY;  Surgeon: Nelva Bush, MD;  Location: [facility];  Service: Cardiovascular;  Laterality: Left;   REMOVAL OF STONES  07/08/2017   Procedure: REMOVAL OF STONES;  Surgeon: Jackquline Denmark, MD;  Location: [facility] ENDOSCOPY;  Service: Endoscopy;;   SPHINCTEROTOMY  07/08/2017   Procedure: Joan Mayans;  Surgeon: Jackquline Denmark, MD;  Location: [facility] ENDOSCOPY;  Service: Endoscopy;;   Family History  Problem Relation Age of Onset   Cancer Mother        ? GYN, died after hysterectomy   Stroke Father    CAD Father    Prostate cancer Brother    CAD Brother 7       CABG   Bladder Cancer Brother 34       Bladder   Hypertension Brother    Hypertension Sister    Hypertension Brother    Hypertension Brother    Hypertension Brother    Hypertension Brother    Bone cancer Paternal Grandfather    Prostate cancer Nephew    Bladder Cancer Nephew    Hypertension Son    Colon cancer Neg Hx    Esophageal cancer Neg Hx    Rectal cancer Neg Hx    Stomach cancer Neg Hx    Social History   Socioeconomic History   Marital status: Married    Spouse name: Not on file   Number of children: 1   Years of education: Not on file   Highest education level: Not on file  Occupational History   Occupation: Information systems manager: SELF    Comment: Information systems manager business  Tobacco Use   Smoking status: Never   Smokeless tobacco: Never  Vaping Use   Vaping Use: Never used  Substance and Sexual Activity   Alcohol use: No   Drug use: No   Sexual activity: Never  Other Topics Concern   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   Social Determinants of Health   Financial Resource Strain: Low Risk     Difficulty of Paying Living Expenses: Not hard at all  Food Insecurity: No Food Insecurity   Worried About Charity fundraiser in the Last Year: Never true   [place] in the Last Year: Never true  Transportation Needs: No Transportation Needs   Lack of Transportation (Medical): No   Lack of Transportation (Non-Medical): No  Physical Activity: Inactive   Days of Exercise per Week: 0 days   Minutes of Exercise per Session: 0 min  Stress: No Stress Concern Present   Feeling of Stress : Not at all  Social Connections: Not on file    Tobacco Counseling Counseling given: Not Answered   Clinical Intake:  Pre-visit preparation completed: Yes  Pain : No/denies  pain     Diabetes: No  How often do you need to have someone help you when you read instructions, pamphlets, or other written materials from your doctor or pharmacy?: 1 - Never  Diabetic: No Nutrition Risk Assessment:  Has the patient had any N/V/D within the last 2 months?  No  Does the patient have any non-healing wounds?  No  Has the patient had any unintentional weight loss or weight gain?  No   Diabetes:  Is the patient diabetic?  No  If diabetic, was a CBG obtained today?   N/A Did the patient bring in their glucometer from home?   N/A How often do you monitor your CBG's? N/A.   Financial Strains and Diabetes Management:  Are you having any financial strains with the device, your supplies or your medication?  N/A .  Does the patient want to be seen by Chronic Care Management for management of their diabetes?   N/A Would the patient like to be referred to a Nutritionist or for Diabetic Management?   N/A  Interpreter Needed?: No  Information entered by :: CJohnson, LPN   Activities of Daily Living In your present state of health, do you have any difficulty performing the following activities: 08/04/2020 12/11/2019  Hearing? N N  Vision? N Y  Comment - glasses  Difficulty concentrating or making  decisions? Y N  Comment some short term memory issues -  Walking or climbing stairs? N N  Dressing or bathing? N -  Doing errands, shopping? N -  Preparing Food and eating ? N -  Using the Toilet? N -  In the past six months, have you accidently leaked urine? N -  Do you have problems with loss of bowel control? N -  Managing your Medications? N -  Managing your Finances? N -  Housekeeping or managing your Housekeeping? N -  Some recent data might be hidden    Patient Care Team: Tonia Ghent, MD as PCP - General (Family Medicine) End, Harrell Gave, MD as PCP - Cardiology (Cardiology)  Indicate any recent Medical Services you may have received from other than [facility] providers in the past year (date may be approximate).     Assessment:   This is a routine wellness examination for [place].  Hearing/Vision screen Vision Screening - Comments:: Patient gets annual eye exams   Dietary issues and exercise activities discussed: Current Exercise Habits: The patient does not participate in regular exercise at present, Exercise limited by: None identified   Goals Addressed             This Visit's Progress    Patient Stated       08/04/2020, I will maintain and continue medications as prescribed.         Depression Screen PHQ 2/9 Scores 08/04/2020 04/29/2018 04/25/2017 04/23/2016 04/01/2015 03/29/2014  PHQ - 2 Score 0 0 0 0 0 0  PHQ- 9 Score 0 - - - - -    Fall Risk Fall Risk  08/04/2020 04/29/2018 04/25/2017 04/23/2016 04/01/2015  Falls in the past year? 0 0 No No No  Number falls in past yr: 0 - - - -  Injury with Fall? 0 - - - -  Risk for fall due to : Medication side effect - - - -  Follow up Falls evaluation completed;Falls prevention discussed - - - -    FALL RISK PREVENTION PERTAINING TO THE HOME:  Any stairs in or around the home? Yes  If  so, are there any without handrails? No  Home free of loose throw rugs in walkways, pet beds, electrical cords, etc? Yes  Adequate  lighting in your home to reduce risk of falls? Yes   ASSISTIVE DEVICES UTILIZED TO PREVENT FALLS:  Life alert? No  Use of a cane, walker or w/c? No  Grab bars in the bathroom? No  Shower chair or bench in shower? No  Elevated toilet seat or a handicapped toilet? No   TIMED UP AND GO:  Was the test performed?  N/A virtual/telephone visit .   Cognitive Function: MMSE - Mini Mental State Exam 08/04/2020  Not completed: Refused       Mini Cog  Mini-Cog screen was not completed. Patient refused. Maximum score is 22. A value of 0 denotes this part of the MMSE was not completed or the patient failed this part of the Mini-Cog screening.  Immunizations Immunization History  Administered Date(s) Administered   Fluad Quad(high Dose 65+) 12/08/2019   Influenza Split 12/28/2011   Influenza Whole 12/29/2008, 01/05/2010   Influenza,inj,Quad PF,6+ Mos 03/29/2014, 04/01/2015   Pneumococcal Polysaccharide-23 01/05/2010   Td 08/27/2006    TDAP status: Due, Education has been provided regarding the importance of this vaccine. Advised may receive this vaccine at local pharmacy or Health Dept. Aware to provide a copy of the vaccination record if obtained from local pharmacy or Health Dept. Verbalized acceptance and understanding.  Flu Vaccine status: Up to date  Pneumococcal vaccine status: Due, Education has been provided regarding the importance of this vaccine. Advised may receive this vaccine at local pharmacy or Health Dept. Aware to provide a copy of the vaccination record if obtained from local pharmacy or Health Dept. Verbalized acceptance and understanding.  Covid-19 vaccine status: Declined, Education has been provided regarding the importance of this vaccine but patient still declined. Advised may receive this vaccine at local pharmacy or Health Dept.or vaccine clinic. Aware to provide a copy of the vaccination record if obtained from local pharmacy or Health Dept. Verbalized acceptance  and understanding.  Qualifies for Shingles Vaccine? Yes   Zostavax completed No   Shingrix Completed?: No.    Education has been provided regarding the importance of this vaccine. Patient has been advised to call insurance company to determine out of pocket expense if they have not yet received this vaccine. Advised may also receive vaccine at local pharmacy or Health Dept. Verbalized acceptance and understanding.  Screening Tests Health Maintenance  Topic Date Due   Zoster Vaccines- Shingrix (1 of 2) Never done   PNA vac Low Risk Adult (2 of 2 - PCV13) 01/06/2011   COVID-19 Vaccine (1) 08/20/2020 (Originally 04/26/1950)   TETANUS/TDAP  08/04/2021 (Originally 08/26/2016)   INFLUENZA VACCINE  09/19/2020   Hepatitis C Screening  Completed   HPV VACCINES  Aged Out    Health Maintenance  Health Maintenance Due  Topic Date Due   Zoster Vaccines- Shingrix (1 of 2) Never done   PNA vac Low Risk Adult (2 of 2 - PCV13) 01/06/2011    Colorectal cancer screening: No longer required.   Lung Cancer Screening: (Low Dose CT Chest recommended if Age 58-80 years, 30 pack-year currently smoking OR have quit w/in 15years.) does not qualify.  Additional Screening:  Hepatitis C Screening: does qualify; Completed 04/10/2016  Vision Screening: Recommended annual ophthalmology exams for early detection of glaucoma and other disorders of the eye. Is the patient up to date with their annual eye exam?  Yes  Who  is the provider or what is the name of the office in which the patient attends annual eye exams? [facility], Dr. Linton Flemings If pt is not established with a provider, would they like to be referred to a provider to establish care? No .   Dental Screening: Recommended annual dental exams for proper oral hygiene  Community Resource Referral / Chronic Care Management: CRR required this visit?  No   CCM required this visit?  No      Plan:     I have personally reviewed and noted the  following in the patient's chart:   Medical and social history Use of alcohol, tobacco or illicit drugs  Current medications and supplements including opioid prescriptions. Patient is not currently taking opioid prescriptions. Functional ability and status Nutritional status Physical activity Advanced directives List of other physicians Hospitalizations, surgeries, and ER visits in previous 12 months Vitals Screenings to include cognitive, depression, and falls Referrals and appointments  In addition, I have reviewed and discussed with patient certain preventive protocols, quality metrics, and best practice recommendations. A written personalized care plan for preventive services as well as general preventive health recommendations were provided to patient.   Due to this being a virtual/telephonic visit, the after visit summary with patients personalized plan was offered to patient via office or my-chart. Patient preferred to pick up at office at next visit or via mychart.   Andrez Grime, LPN   [id number]

## 2020-08-05 ENCOUNTER — Telehealth: Payer: Self-pay | Admitting: Family Medicine

## 2020-08-05 NOTE — Telephone Encounter (Signed)
Thanks

## 2020-08-05 NOTE — Telephone Encounter (Signed)
Called patient and he is schedule for 9/9 for labs and 9/16 for CPE

## 2020-08-05 NOTE — Telephone Encounter (Signed)
-----   Message from Tonia Ghent, MD sent at 08/05/2020  8:50 AM EDT ----- Regarding: please try to schedule yearly visit with me this fall.  labs ahead of time if possible.  thanks.

## 2020-09-12 ENCOUNTER — Telehealth: Payer: Self-pay | Admitting: Family Medicine

## 2020-09-12 NOTE — Telephone Encounter (Signed)
Called to R/S CPE in September and Billy Kim stated that she needs a order for [facility] due to its free. And she is going to pick up in September.

## 2020-09-14 NOTE — Telephone Encounter (Signed)
I printed a letter for patient. Please send after I sign it.  Thanks.

## 2020-09-15 NOTE — Telephone Encounter (Signed)
Letter has been mailed to patient and patient has been notified.

## 2020-09-21 ENCOUNTER — Other Ambulatory Visit: Payer: Self-pay

## 2020-09-21 ENCOUNTER — Encounter: Payer: Self-pay | Admitting: Internal Medicine

## 2020-09-21 ENCOUNTER — Ambulatory Visit (INDEPENDENT_AMBULATORY_CARE_PROVIDER_SITE_OTHER): Payer: Managed Care, Other (non HMO) | Admitting: Internal Medicine

## 2020-09-21 VITALS — BP 136/82 | HR 62 | Ht 67.0 in | Wt 186.0 lb

## 2020-09-21 DIAGNOSIS — I25118 Atherosclerotic heart disease of native coronary artery with other forms of angina pectoris: Secondary | ICD-10-CM | POA: Diagnosis not present

## 2020-09-21 DIAGNOSIS — E785 Hyperlipidemia, unspecified: Secondary | ICD-10-CM

## 2020-09-21 DIAGNOSIS — I10 Essential (primary) hypertension: Secondary | ICD-10-CM

## 2020-09-21 DIAGNOSIS — E781 Pure hyperglyceridemia: Secondary | ICD-10-CM

## 2020-09-21 DIAGNOSIS — E782 Mixed hyperlipidemia: Secondary | ICD-10-CM | POA: Diagnosis not present

## 2020-09-21 DIAGNOSIS — Z79899 Other long term (current) drug therapy: Secondary | ICD-10-CM | POA: Diagnosis not present

## 2020-09-21 NOTE — Patient Instructions (Signed)
Medication Instructions:   Your physician recommends that you continue on your current medications as directed. Please refer to the Current Medication list given to you today.  *If you need a refill on your cardiac medications before your next appointment, please call your pharmacy*   Lab Work:  Your physician recommends that you have FASTING lab work (Lipid panel, CBC, CMET)   - Lab order printed and given today to be done at [facility]  Testing/Procedures:  None orderd   Follow-Up: At [facility], you and your health needs are our priority.  As part of our continuing mission to provide you with exceptional heart care, we have created designated Provider Care Teams.  These Care Teams include your primary Cardiologist (physician) and Advanced Practice Providers (APPs -  Physician Assistants and Nurse Practitioners) who all work together to provide you with the care you need, when you need it.  We recommend signing up for the patient portal called "MyChart".  Sign up information is provided on this After Visit Summary.  MyChart is used to connect with patients for Virtual Visits (Telemedicine).  Patients are able to view lab/test results, encounter notes, upcoming appointments, etc.  Non-urgent messages can be sent to your provider as well.   To learn more about what you can do with MyChart, go to [email or web address].    Your next appointment:   6 month(s)  The format for your next appointment:   In Person  Provider:   You may see Nelva Bush, MD or one of the following Advanced Practice Providers on your designated Care Team:   Murray Hodgkins, NP Christell Faith, PA-C Marrianne Mood, PA-C Cadence Cement, [place]

## 2020-09-21 NOTE — Progress Notes (Signed)
Follow-up Outpatient Visit Date: 09/21/2020  Primary Care Provider: Tonia Ghent, MD [place] [address]  Chief Complaint: Follow-up coronary artery disease  HPI:  [name] is a 75 y.o. male with history of  coronary artery disease being managed medically (chronic total occlusions of D1 and nondominant RCA as well as moderate proximal LAD disease that was not significant by iFR/FFR), hypertension, and ulcerative colitis, who presents for follow-up of artery disease.  I last saw him in 03/2020, at which time he reported feeling better with escalation of isosorbide mononitrate though he continued to have brief upper chest discomfort with very strenuous activities.  We agreed to increase isosorbide mononitrate to 60 mg daily.  Today, [name] reports that his chest tightness with strenuous activity might be slightly better with escalation of isosorbide mononitrate after our last visit, though he continues to get it with vigorous activity.  He has tolerated the medication well without headaches.  He denies shortness of breath, lightheadedness, and edema.  He has recently experienced pain in the neck, usually when he gets up in the morning.  He has not had any exertional neck discomfort.  --------------------------------------------------------------------------------------------------  Past Medical History:  Diagnosis Date   Coronary artery disease 01/2020   Moderate proximal LAD disease (not hemodynamically significant) and CTO's of D1 and non-dominant RCA   HLD (hyperlipidemia)    Hypertension    Ulcerative colitis 5/81   Remission for years   Past Surgical History:  Procedure Laterality Date   CARDIAC CATHETERIZATION  03/20/01   Cardiolite EF 55% 02/10/02   CHOLECYSTECTOMY N/A 07/09/2017   Procedure: LAPAROSCOPIC CHOLECYSTECTOMY;  Surgeon: Judeth Horn, MD;  Location: [facility];  Service: General;  Laterality: N/A;   COLONOSCOPY  multiple   ENDOSCOPIC RETROGRADE  CHOLANGIOPANCREATOGRAPHY (ERCP) WITH PROPOFOL N/A 07/08/2017   Procedure: ENDOSCOPIC RETROGRADE CHOLANGIOPANCREATOGRAPHY (ERCP) WITH PROPOFOL;  Surgeon: Jackquline Denmark, MD;  Location: [facility] ENDOSCOPY;  Service: Endoscopy;  Laterality: N/A;   INGUINAL HERNIA REPAIR  04/09/06   Bilateral   LAPAROSCOPIC APPENDECTOMY  03/1981   LEFT HEART CATH AND CORONARY ANGIOGRAPHY Left 12/11/2019   Procedure: LEFT HEART CATH AND CORONARY ANGIOGRAPHY;  Surgeon: Nelva Bush, MD;  Location: [facility];  Service: Cardiovascular;  Laterality: Left;   REMOVAL OF STONES  07/08/2017   Procedure: REMOVAL OF STONES;  Surgeon: Jackquline Denmark, MD;  Location: [facility] ENDOSCOPY;  Service: Endoscopy;;   SPHINCTEROTOMY  07/08/2017   Procedure: Joan Mayans;  Surgeon: Jackquline Denmark, MD;  Location: [facility] ENDOSCOPY;  Service: Endoscopy;;     Current Meds  Medication Sig   Ascorbic Acid (VITAMIN C) 1000 MG tablet Take 1,000 mg by mouth daily.   aspirin EC 81 MG tablet Take 1 tablet (81 mg total) by mouth daily. Swallow whole.   carvedilol (COREG) 3.125 MG tablet TAKE 1 TABLET(3.125 MG) BY MOUTH TWICE DAILY   cholecalciferol (VITAMIN D3) 25 MCG (1000 UNIT) tablet Take 2,000 Units by mouth daily.    Coenzyme Q10 100 MG capsule Take 100 mg by mouth daily.   famotidine (PEPCID) 20 MG tablet Take 1 tablet (20 mg total) by mouth 2 (two) times daily as needed for heartburn or indigestion.   Garlic [id number] MG TABS Take 500 mg by mouth daily.   isosorbide mononitrate (IMDUR) 60 MG 24 hr tablet Take 1 tablet (60 mg total) by mouth daily.   losartan (COZAAR) 25 MG tablet TAKE 1 TABLET(25 MG) BY MOUTH DAILY   nitroGLYCERIN (NITROSTAT) 0.4 MG SL  tablet Place 1 tablet (0.4 mg total) under the tongue every 5 (five) minutes as needed for chest pain. Maximum of 3 doses.   omeprazole (PRILOSEC) 40 MG capsule Take 1 capsule (40 mg total) by mouth daily before breakfast.   rosuvastatin (CRESTOR) 5 MG tablet Take 1 tablet (5 mg total) by mouth daily.     Allergies: Amoxicillin, Lyrica [pregabalin], Morphine sulfate, Neurontin [gabapentin], and Sulfonamide derivatives  Social History   Tobacco Use   Smoking status: Never   Smokeless tobacco: Never  Vaping Use   Vaping Use: Never used  Substance Use Topics   Alcohol use: No   Drug use: No    Family History  Problem Relation Age of Onset   Cancer Mother        ? GYN, died after hysterectomy   Stroke Father    CAD Father    Hypertension Sister    Prostate cancer Brother    CAD Brother 1       CABG   Bladder Cancer Brother 55       Bladder   Hypertension Brother    Hypertension Brother    Hypertension Brother    Hypertension Brother    Hypertension Brother    Bone cancer Paternal Grandfather    Hypertension Son    Prostate cancer Nephew    Bladder Cancer Nephew    Colon cancer Neg Hx    Esophageal cancer Neg Hx    Rectal cancer Neg Hx    Stomach cancer Neg Hx     Review of Systems: A 12-system review of systems was performed and was negative except as noted in the HPI.  --------------------------------------------------------------------------------------------------  Physical Exam: BP 136/82 (BP Location: Left Arm, Patient Position: Sitting, Cuff Size: Large)   Pulse 62   Ht '5\' 7"'$  (1.702 m)   Wt 186 lb (84.4 kg)   SpO2 98%   BMI 29.13 kg/m   General:  NAD. Neck: No JVD or HJR. Lungs: Clear to auscultation bilaterally without wheezes or crackles. Heart: Regular rate and rhythm without murmurs, rubs, or gallops. Abdomen: Soft, nontender, nondistended. Extremities: No lower extremity edema.  EKG:  NSR with RB BB and borderline LVH.  No significant change since 03/23/2020.  Lab Results  Component Value Date   WBC 6.2 12/08/2019   HGB 16.1 12/08/2019   HCT 44.8 12/08/2019   MCV 86 12/08/2019   PLT 131 (L) 12/08/2019    Lab Results  Component Value Date   NA 144 12/08/2019   K 4.7 12/08/2019   CL 106 12/08/2019   CO2 26 12/08/2019   BUN 13  12/08/2019   CREATININE 1.14 12/08/2019   GLUCOSE 95 12/08/2019   ALT 16 04/29/2019    Lab Results  Component Value Date   CHOL 124 10/01/2019   HDL 23 (L) 10/01/2019   LDLCALC 56 10/01/2019   LDLDIRECT 64.2 12/29/2008   TRIG 224 (H) 10/01/2019   CHOLHDL 5.4 10/01/2019    --------------------------------------------------------------------------------------------------  ASSESSMENT AND PLAN: Coronary artery disease with stable angina: [name] reports modest improvement with escalation of isosorbide mononitrate at our last visit.  I suspect his continued pain with strenuous activity is due to occluded diagonal branch, as the moderate LAD disease was not significant by iFR/FFR.  We have agreed to defer medication changes.  [name] will continue to work on lifestyle modifications.  Hypertension: BP reasonable today.  No medication changes at this time.  Hyperlipidemia: Most recent lipid panel a year ago showed excellent  LDL but mildly elevated triglycerides.  We will arrange for CBC, CMP, and fasting lipid panel at [name] convenience.  We will continue rosuvastatin 5 mg daily pending results of the tests.  Follow-up: Return to clinic in 6 months.  Nelva Bush, MD 09/21/2020 9:33 AM

## 2020-09-22 ENCOUNTER — Encounter: Payer: Self-pay | Admitting: Internal Medicine

## 2020-09-28 ENCOUNTER — Other Ambulatory Visit: Payer: Self-pay | Admitting: Internal Medicine

## 2020-09-28 ENCOUNTER — Other Ambulatory Visit: Payer: Self-pay | Admitting: Family Medicine

## 2020-09-29 ENCOUNTER — Encounter: Payer: Self-pay | Admitting: Internal Medicine

## 2020-09-29 DIAGNOSIS — I25118 Atherosclerotic heart disease of native coronary artery with other forms of angina pectoris: Secondary | ICD-10-CM

## 2020-09-29 DIAGNOSIS — Z79899 Other long term (current) drug therapy: Secondary | ICD-10-CM

## 2020-09-29 DIAGNOSIS — E782 Mixed hyperlipidemia: Secondary | ICD-10-CM

## 2020-09-29 DIAGNOSIS — I10 Essential (primary) hypertension: Secondary | ICD-10-CM

## 2020-09-29 LAB — CBC
Hematocrit: 42.1 % (ref 37.5–51.0)
Hemoglobin: 14.7 g/dL (ref 13.0–17.7)
MCH: 30.1 pg (ref 26.6–33.0)
MCHC: 34.9 g/dL (ref 31.5–35.7)
MCV: 86 fL (ref 79–97)
Platelets: 111 10*3/uL — ABNORMAL LOW (ref 150–450)
RBC: 4.88 x10E6/uL (ref 4.14–5.80)
RDW: 13.3 % (ref 11.6–15.4)
WBC: 6.3 10*3/uL (ref 3.4–10.8)

## 2020-09-29 LAB — COMPREHENSIVE METABOLIC PANEL
ALT: 16 IU/L (ref 0–44)
ALT: 16 IU/L (ref 0–44)
AST: 20 IU/L (ref 0–40)
AST: 21 IU/L (ref 0–40)
Albumin/Globulin Ratio: 1.9 (ref 1.2–2.2)
Albumin/Globulin Ratio: 1.7 (ref 1.2–2.2)
Albumin: 4.1 g/dL (ref 3.7–4.7)
Albumin: 3.9 g/dL (ref 3.7–4.7)
Alkaline Phosphatase: 90 IU/L (ref 44–121)
Alkaline Phosphatase: 93 IU/L (ref 44–121)
BUN/Creatinine Ratio: 16 (ref 10–24)
BUN/Creatinine Ratio: 16 (ref 10–24)
BUN: 19 mg/dL (ref 8–27)
BUN: 20 mg/dL (ref 8–27)
Bilirubin Total: 1.5 mg/dL — ABNORMAL HIGH (ref 0.0–1.2)
Bilirubin Total: 1.6 mg/dL — ABNORMAL HIGH (ref 0.0–1.2)
CO2: 23 mmol/L (ref 20–29)
CO2: 23 mmol/L (ref 20–29)
Calcium: 8.5 mg/dL — ABNORMAL LOW (ref 8.6–10.2)
Calcium: 8.6 mg/dL (ref 8.6–10.2)
Chloride: 107 mmol/L — ABNORMAL HIGH (ref 96–106)
Chloride: 106 mmol/L (ref 96–106)
Creatinine, Ser: 1.22 mg/dL (ref 0.76–1.27)
Creatinine, Ser: 1.27 mg/dL (ref 0.76–1.27)
Globulin, Total: 2.2 g/dL (ref 1.5–4.5)
Globulin, Total: 2.3 g/dL (ref 1.5–4.5)
Glucose: 102 mg/dL — ABNORMAL HIGH (ref 65–99)
Glucose: 101 mg/dL — ABNORMAL HIGH (ref 65–99)
Potassium: 4.3 mmol/L (ref 3.5–5.2)
Potassium: 4.3 mmol/L (ref 3.5–5.2)
Sodium: 145 mmol/L — ABNORMAL HIGH (ref 134–144)
Sodium: 144 mmol/L (ref 134–144)
Total Protein: 6.3 g/dL (ref 6.0–8.5)
Total Protein: 6.2 g/dL (ref 6.0–8.5)
eGFR: 61 mL/min/{1.73_m2} (ref >59)
eGFR: 59 mL/min/{1.73_m2} — ABNORMAL LOW (ref >59)

## 2020-09-29 LAB — CBC WITH DIFFERENTIAL/PLATELET
Basophils Absolute: 0.1 10*3/uL (ref 0.0–0.2)
Basos: 1 %
EOS (ABSOLUTE): 0.3 10*3/uL (ref 0.0–0.4)
Eos: 4 %
Hematocrit: 42.2 % (ref 37.5–51.0)
Hemoglobin: 14.7 g/dL (ref 13.0–17.7)
Immature Grans (Abs): 0 10*3/uL (ref 0.0–0.1)
Immature Granulocytes: 0 %
Lymphocytes Absolute: 1 10*3/uL (ref 0.7–3.1)
Lymphs: 16 %
MCH: 30.1 pg (ref 26.6–33.0)
MCHC: 34.8 g/dL (ref 31.5–35.7)
MCV: 86 fL (ref 79–97)
Monocytes Absolute: 0.7 10*3/uL (ref 0.1–0.9)
Monocytes: 11 %
Neutrophils Absolute: 4.4 10*3/uL (ref 1.4–7.0)
Neutrophils: 68 %
Platelets: 124 10*3/uL — ABNORMAL LOW (ref 150–450)
RBC: 4.89 x10E6/uL (ref 4.14–5.80)
RDW: 13.3 % (ref 11.6–15.4)
WBC: 6.4 10*3/uL (ref 3.4–10.8)

## 2020-09-29 LAB — LIPID PANEL
Chol/HDL Ratio: 4 ratio (ref 0.0–5.0)
Cholesterol, Total: 107 mg/dL (ref 100–199)
HDL: 27 mg/dL — ABNORMAL LOW (ref >39)
LDL Chol Calc (NIH): 36 mg/dL (ref 0–99)
Triglycerides: 287 mg/dL — ABNORMAL HIGH (ref 0–149)
VLDL Cholesterol Cal: 44 mg/dL — ABNORMAL HIGH (ref 5–40)

## 2020-09-29 LAB — SPECIMEN STATUS REPORT

## 2020-09-29 LAB — PSA: Prostate Specific Ag, Serum: 2.9 ng/mL (ref 0.0–4.0)

## 2020-09-29 LAB — LIPID PANEL W/O CHOL/HDL RATIO
Cholesterol, Total: 114 mg/dL (ref 100–199)
HDL: 28 mg/dL — ABNORMAL LOW (ref >39)
LDL Chol Calc (NIH): 42 mg/dL (ref 0–99)
Triglycerides: 284 mg/dL — ABNORMAL HIGH (ref 0–149)
VLDL Cholesterol Cal: 44 mg/dL — ABNORMAL HIGH (ref 5–40)

## 2020-09-30 ENCOUNTER — Telehealth: Payer: Self-pay | Admitting: *Deleted

## 2020-09-30 DIAGNOSIS — E781 Pure hyperglyceridemia: Secondary | ICD-10-CM

## 2020-09-30 DIAGNOSIS — Z79899 Other long term (current) drug therapy: Secondary | ICD-10-CM

## 2020-09-30 DIAGNOSIS — E785 Hyperlipidemia, unspecified: Secondary | ICD-10-CM

## 2020-09-30 MED ORDER — ROSUVASTATIN CALCIUM 10 MG PO TABS: 10.0000 mg | ORAL_TABLET | Freq: Every day | ORAL | 3 refills | Status: DC

## 2020-09-30 NOTE — Telephone Encounter (Signed)
Spoke to pt, notified of results and Dr. Darnelle Bos recc.  Pt voiced understanding. He will:  1) Incr Rosuvastatin to 10 mg daily 2) Repeat lipid panel / ALT in 3 months  New Rx sent to pt's pharmacy for Rosuvastatin 10 mg daily.  Pt will double current dose of Rosuvastatin 5 mg for total of 10 mg until Rx filled.  Pt requests to have labs completed at [facility] since his wife is an Glass blower/designer.  Lab orders mailed to pt. Pt is aware to be fasting for labs.  Will follow up for results to send to Dr. Saunders Revel in 3 months.  Pt has no further questions at this time.

## 2020-09-30 NOTE — Telephone Encounter (Signed)
-----   Message from Nelva Bush, MD sent at 09/30/2020  8:52 AM EDT ----- Please let Billy Kim know that his kidney function, liver function, and electrolytes are stable.  His blood counts are normal.  His LDL (bad cholesterol) is well controlled.  Triglycerides are mildly elevated.  I suggest that we increase rosuvastatin to 10 mg daily in an effort to help improve his triglycerides.  He should otherwise continue his current medications.  I suggest that we repeat a lipid panel and ALT in ~3 months.  I will forward these results to Dr. Damita Dunnings for his review as well.

## 2020-10-28 ENCOUNTER — Other Ambulatory Visit: Payer: Medicare Other

## 2020-11-04 ENCOUNTER — Encounter: Payer: Medicare Other | Admitting: Family Medicine

## 2020-11-08 ENCOUNTER — Encounter: Payer: Self-pay | Admitting: Family Medicine

## 2020-11-08 ENCOUNTER — Other Ambulatory Visit: Payer: Self-pay

## 2020-11-08 ENCOUNTER — Ambulatory Visit (INDEPENDENT_AMBULATORY_CARE_PROVIDER_SITE_OTHER): Payer: Managed Care, Other (non HMO) | Admitting: Family Medicine

## 2020-11-08 VITALS — BP 122/80 | HR 64 | Temp 98.7°F | Ht 67.0 in | Wt 187.0 lb

## 2020-11-08 DIAGNOSIS — R059 Cough, unspecified: Secondary | ICD-10-CM | POA: Diagnosis not present

## 2020-11-08 DIAGNOSIS — I25118 Atherosclerotic heart disease of native coronary artery with other forms of angina pectoris: Secondary | ICD-10-CM

## 2020-11-08 DIAGNOSIS — D696 Thrombocytopenia, unspecified: Secondary | ICD-10-CM

## 2020-11-08 DIAGNOSIS — E785 Hyperlipidemia, unspecified: Secondary | ICD-10-CM

## 2020-11-08 DIAGNOSIS — Z0000 Encounter for general adult medical examination without abnormal findings: Secondary | ICD-10-CM

## 2020-11-08 DIAGNOSIS — I10 Essential (primary) hypertension: Secondary | ICD-10-CM | POA: Diagnosis not present

## 2020-11-08 DIAGNOSIS — Z7189 Other specified counseling: Secondary | ICD-10-CM

## 2020-11-08 MED ORDER — LOSARTAN POTASSIUM 25 MG PO TABS: 25.0000 mg | ORAL_TABLET | Freq: Every day | ORAL | 3 refills | Status: DC

## 2020-11-08 MED ORDER — CARVEDILOL 3.125 MG PO TABS: ORAL_TABLET | ORAL | 3 refills | Status: DC

## 2020-11-08 NOTE — Patient Instructions (Signed)
Hold the crestor for 1 week.   Either way, update me about the aches next week.  We'll go from there.  I would get a flu shot each fall.   Take care.  Glad to see you.

## 2020-11-08 NOTE — Progress Notes (Signed)
This visit occurred during the SARS-CoV-2 public health emergency.  Safety protocols were in place, including screening questions prior to the visit, additional usage of staff PPE, and extensive cleaning of exam room while observing appropriate contact time as indicated for disinfecting solutions.  I have personally reviewed the Medicare Annual Wellness questionnaire and have noted 1. The patient's medical and social history 2. Their use of alcohol, tobacco or illicit drugs 3. Their current medications and supplements 4. The patient's functional ability including ADL's, fall risks, home safety risks and hearing or visual             impairment. 5. Diet and physical activities 6. Evidence for depression or mood disorders  The patients weight, height, BMI have been recorded in the chart and visual acuity is per eye clinic.  I have made referrals, counseling and provided education to the patient based review of the above and I have provided the pt with a written personalized care plan for preventive services.  Provider list updated- see scanned forms.  Routine anticipatory guidance given to patient.  See health maintenance. The possibility exists that previously documented standard health maintenance information may have been brought forward from a previous encounter into this note.  If needed, that same information has been updated to reflect the current situation based on today's encounter.    Flu encouraged Shingles discussed with patient, encouraged. PNA 2011 Tetanus 2008, discussed with patient.  Encouraged COVID-vaccine encouraged. Colonoscopy 2015 Prostate cancer screening 2022 Advance directive-wife designated patient were incapacitated. Cognitive function addressed- see scanned forms- and if abnormal then additional documentation follows.   In addition to [facility] Wellness, follow up visit for the below conditions:  Elevated Cholesterol: Using medications without problems: see  below.   Muscle aches: diffuse aches in the legs but not the arms.  Going on for about 2-3 months.  Diet compliance: encouraged.  Exercise: encouraged.    Hypertension:    Using medication without problems or lightheadedness: yes Chest pain with exertion: he had some chest discomfort carrying >30 lbs.  That is stable.  Better with rest.   Edema:no Short of breath:no  H/o thrombocytopenia but no bleeding and recent labs are stable.    He had some occasional recent cough and some sputum but no fevers.  His lungs are clear.  See exam below.  PMH and SH reviewed  Meds, vitals, and allergies reviewed.   ROS: Per HPI.  Unless specifically indicated otherwise in HPI, the patient denies:  General: fever. Eyes: acute vision changes ENT: sore throat Cardiovascular: chest pain Respiratory: SOB GI: vomiting GU: dysuria Musculoskeletal: acute back pain Derm: acute rash Neuro: acute motor dysfunction Psych: worsening mood Endocrine: polydipsia Heme: bleeding Allergy: hayfever  GEN: nad, alert and oriented HEENT: ncat NECK: supple w/o LA CV: rrr. PULM: ctab, no inc wob ABD: soft, +bs EXT: no edema SKIN: Well-perfused.

## 2020-11-09 ENCOUNTER — Telehealth: Payer: Self-pay | Admitting: Family Medicine

## 2020-11-09 DIAGNOSIS — Z0000 Encounter for general adult medical examination without abnormal findings: Secondary | ICD-10-CM | POA: Insufficient documentation

## 2020-11-09 NOTE — Assessment & Plan Note (Signed)
  H/o thrombocytopenia but no bleeding and recent labs are stable.  Continue observation for now.

## 2020-11-09 NOTE — Assessment & Plan Note (Signed)
Advance directive-wife designated patient were incapacitated. 

## 2020-11-09 NOTE — Assessment & Plan Note (Addendum)
He has stable exertional discomfort when carrying greater than 30 pounds.  It improves with rest.  He has talked to cardiology about it from before.   No chest pain currently.  Per patient his symptoms are not escalating.  I would continue aspirin carvedilol isosorbide and losartan.  I will update cardiology about his situation and ask for input.

## 2020-11-09 NOTE — Telephone Encounter (Signed)
He has stable exertional discomfort when carrying greater than 30 pounds.  It improves with rest.  Per patient his symptoms are not escalating.  I did not change his medications at his most recent office visit.  I do not know if it makes sense to increase his long-acting nitrate and I would like your input.  Thanks.

## 2020-11-09 NOTE — Assessment & Plan Note (Signed)
Lungs are clear.  Nontoxic-appearing.  No fevers.  I asked him to monitor his symptoms and update me if progressive or not improving.

## 2020-11-09 NOTE — Assessment & Plan Note (Addendum)
Flu encouraged Shingles discussed with patient, encouraged. PNA 2011 Tetanus 2008, discussed with patient.  Encouraged COVID-vaccine encouraged. Colonoscopy 2015 Prostate cancer screening 2022 Advance directive-wife designated patient were incapacitated. Cognitive function addressed- see scanned forms- and if abnormal then additional documentation follows.

## 2020-11-09 NOTE — Assessment & Plan Note (Signed)
Has diffuse aches in the legs but not the arms.  Going on for about 2-3 months.  I asked him to hold the crestor for 1 week.   Either way, he'll update me about the aches next week.  We'll go from there.

## 2020-11-10 MED ORDER — ISOSORBIDE MONONITRATE ER 60 MG PO TB24: 90.0000 mg | ORAL_TABLET | Freq: Every day | ORAL | 3 refills | Status: DC

## 2020-11-10 NOTE — Telephone Encounter (Signed)
I thank all involved. 

## 2020-11-10 NOTE — Telephone Encounter (Signed)
Spoke with pt.  Notified of Dr. Darnelle Bos recc.  Pt voiced understaning.  Pt will incr Imdur to 90 mg daily.  New Rx has been sent to pt's pharmacy.  He will take 1 1/2 tablets of current dose for total of 90 mg in the meantime until picks up new Rx.  Pt has been scheduled for a follow up with Marrianne Mood, PA next week 11/15/20 at 8:00 AM.  Pt has no further questions at this time.

## 2020-11-10 NOTE — Telephone Encounter (Signed)
Thank you for the update.  I think it would be appropriate to have him increase Imdur to 90 mg daily to see if that helps.  I will have our office reach out to him to schedule a follow-up appointment with me or an APP at Mr. Eber earliest convenience to see if additional testing is needed.  Please let me know if any other questions or concerns come up.  Nelva Bush, MD [facility]

## 2020-11-15 ENCOUNTER — Ambulatory Visit (INDEPENDENT_AMBULATORY_CARE_PROVIDER_SITE_OTHER): Payer: Managed Care, Other (non HMO) | Admitting: Physician Assistant

## 2020-11-15 ENCOUNTER — Encounter: Payer: Self-pay | Admitting: Physician Assistant

## 2020-11-15 ENCOUNTER — Other Ambulatory Visit: Payer: Self-pay

## 2020-11-15 VITALS — BP 120/90 | HR 57 | Ht 67.0 in | Wt 186.5 lb

## 2020-11-15 DIAGNOSIS — R079 Chest pain, unspecified: Secondary | ICD-10-CM

## 2020-11-15 DIAGNOSIS — I10 Essential (primary) hypertension: Secondary | ICD-10-CM

## 2020-11-15 DIAGNOSIS — E785 Hyperlipidemia, unspecified: Secondary | ICD-10-CM | POA: Diagnosis not present

## 2020-11-15 DIAGNOSIS — Z789 Other specified health status: Secondary | ICD-10-CM

## 2020-11-15 DIAGNOSIS — I25118 Atherosclerotic heart disease of native coronary artery with other forms of angina pectoris: Secondary | ICD-10-CM

## 2020-11-15 DIAGNOSIS — Z79899 Other long term (current) drug therapy: Secondary | ICD-10-CM

## 2020-11-15 MED ORDER — EZETIMIBE 10 MG PO TABS: 10.0000 mg | ORAL_TABLET | Freq: Every day | ORAL | 2 refills | Status: DC

## 2020-11-15 MED ORDER — ISOSORBIDE MONONITRATE ER 120 MG PO TB24
120.0000 mg | ORAL_TABLET | Freq: Every day | ORAL | 2 refills | Status: DC
Start: 2020-11-15 — End: 2021-06-07

## 2020-11-15 NOTE — Progress Notes (Signed)
Office Visit    Patient Name: Billy Kim Date of Encounter: 11/15/2020  PCP:  Tonia Ghent, MD   [place]  Cardiologist:  Nelva Bush, MD  Advanced Practice Provider:  No care team member to display Electrophysiologist:  None   Chief Complaint    Chief Complaint  Patient presents with   Other    Chest pain w/exertion . Meds reviewed verbally with pt.    75 y.o. male with history of medically managed coronary artery disease, hypertension, ulcerative colitis, and who presents today for early follow-up given PCP concern for exertional angina.  Past Medical History    Past Medical History:  Diagnosis Date   Coronary artery disease 01/2020   Moderate proximal LAD disease (not hemodynamically significant) and CTO's of D1 and non-dominant RCA   HLD (hyperlipidemia)    Hypertension    Ulcerative colitis 5/81   Remission for years   Past Surgical History:  Procedure Laterality Date   CARDIAC CATHETERIZATION  03/20/01   Cardiolite EF 55% 02/10/02   CHOLECYSTECTOMY N/A 07/09/2017   Procedure: LAPAROSCOPIC CHOLECYSTECTOMY;  Surgeon: Judeth Horn, MD;  Location: [place];  Service: General;  Laterality: N/A;   COLONOSCOPY  multiple   ENDOSCOPIC RETROGRADE CHOLANGIOPANCREATOGRAPHY (ERCP) WITH PROPOFOL N/A 07/08/2017   Procedure: ENDOSCOPIC RETROGRADE CHOLANGIOPANCREATOGRAPHY (ERCP) WITH PROPOFOL;  Surgeon: Jackquline Denmark, MD;  Location: [facility] ENDOSCOPY;  Service: Endoscopy;  Laterality: N/A;   INGUINAL HERNIA REPAIR  04/09/06   Bilateral   LAPAROSCOPIC APPENDECTOMY  03/1981   LEFT HEART CATH AND CORONARY ANGIOGRAPHY Left 12/11/2019   Procedure: LEFT HEART CATH AND CORONARY ANGIOGRAPHY;  Surgeon: Nelva Bush, MD;  Location: [place] [facility];  Service: Cardiovascular;  Laterality: Left;   REMOVAL OF STONES  07/08/2017   Procedure: REMOVAL OF STONES;  Surgeon: Jackquline Denmark, MD;  Location: [facility] ENDOSCOPY;  Service: Endoscopy;;   SPHINCTEROTOMY   07/08/2017   Procedure: Joan Mayans;  Surgeon: Jackquline Denmark, MD;  Location: [facility] ENDOSCOPY;  Service: Endoscopy;;    Allergies  Allergies  Allergen Reactions   Amoxicillin Other (See Comments)    Caused headache Has patient had a PCN reaction causing immediate rash, facial/tongue/throat swelling, SOB or lightheadedness with hypotension: No Has patient had a PCN reaction causing severe rash involving mucus membranes or skin necrosis: No Has patient had a PCN reaction that required hospitalization: No Has patient had a PCN reaction occurring within the last 10 years: No If all of the above answers are "NO", then may proceed with Cephalosporin use.   Lyrica [Pregabalin]     Intolerant, worsening headache   Morphine Sulfate Other (See Comments)    headache   Neurontin [Gabapentin] Other (See Comments)    headache   Sulfonamide Derivatives Other (See Comments)    headache    History of Present Illness    Billy Kim is a 75 y.o. male with PMH as above.  He has history of medically managed CAD with chronic total occlusion of D1 and nondominant RCA, as well as moderate proximal LAD disease that is not significant by IFR/FFR, hypertension, ulcerative colitis, and is seen today for follow-up of exertional chest pain.  He was seen 03/2020, at which point he reported feeling better with escalation of Imdur, though he continued to have brief upper chest discomfort with strenuous activities.  Isosorbide mononitrate increased to 60 mg daily.  He reported ongoing chest tightness with strenuous activity, possibly slightly improved.  He had recently experienced pain in  the neck, usually when getting up in the morning.  It was thought that his symptoms were due to occluded diagonal branch, as well as moderate LAD disease that was not significant by IFR/FFR.  Medication changes were deferred.  He was seen by his PCP recently with concern regarding ongoing symptoms expressed by the PCP.  Imdur was increased  to 90 mg daily.  Follow-up was arranged with our office.  Today, 11/15/2020, he reports that he is mainly concerned with recent myalgias and weakness in his legs.  He reports ongoing chest pain if exerting himself for a long stretch of time, usually only occurring when his heart rate is elevated.  He attributes this to not exercising as much as he should at baseline.  He reports that the chest pain usually improves if he pushes through the activity.  He reports some presyncope but is uncertain if this is due to medications and is unable to identify any clear triggers or recall any clear instances.  He reports that his chest pain is overall improved from previous visits, though it still continues to occur, and it is made worse with heavy lifting.  He has noticed that it is often worse with elevated BP, reporting BP at home 154/90.  He has been holding his statin due to reporting flulike symptoms and achy/tired legs.  He has noticed an improvement with holding his statin and alternatives to statins to discuss today with recommendations as below.  He denies any orthopnea, PND, early satiety.  He notes some swelling around his sock, usually gone with leg elevation/in the morning.  He reports slow weight gain, attributed to sedentary lifestyle and overall diet.  Overall, he states he is not concerned with any chest pain today.  He reports his symptoms of GERD, ulcerative colitis have been well controlled.  Home Medications   Current Outpatient Medications  Medication Instructions   aspirin EC 81 mg, Oral, Daily, Swallow whole.   carvedilol (COREG) 3.125 MG tablet TAKE 1 TABLET(3.125 MG) BY MOUTH TWICE DAILY   cholecalciferol (VITAMIN D3) 2,000 Units, Oral, Daily   Coenzyme Q10 100 mg, Oral, Daily,     famotidine (PEPCID) 20 mg, Oral, 2 times daily PRN   Garlic 397 mg, Oral, Daily   isosorbide mononitrate (IMDUR) 90 mg, Oral, Daily   losartan (COZAAR) 25 mg, Oral, Daily   nitroGLYCERIN (NITROSTAT) 0.4 mg,  Sublingual, Every 5 min PRN, Maximum of 3 doses.   omeprazole (PRILOSEC) 40 mg, Oral, Daily before breakfast   rosuvastatin (CRESTOR) 10 mg, Oral, Daily   vitamin C 1,000 mg, Oral, Daily,       Review of Systems    He reports ongoing to somewhat improved exertional chest pain.  He denies palpitations, dyspnea, pnd, orthopnea, n, v, dizziness, syncope, edema, or early satiety.  He reports slow weight gain, attributed to sedentary lifestyle and diet.   All other systems reviewed and are otherwise negative except as noted above.  Physical Exam    VS:  BP 120/90 (BP Location: Left Arm, Patient Position: Sitting, Cuff Size: Normal)   Pulse (!) 57   Ht 5\' 7"  (1.702 m)   Wt 186 lb 8 oz (84.6 kg)   SpO2 98%   BMI 29.21 kg/m  , BMI Body mass index is 29.21 kg/m. GEN: Well nourished, well developed, in no acute distress. HEENT: normal. Neck: Supple, no JVD, carotid bruits, or masses. Cardiac: Bradycardic but regular, no murmurs, rubs, or gallops. No clubbing, cyanosis, edema.  Radials/DP/PT 2+  and equal bilaterally.  Respiratory:  Respirations regular and unlabored, clear to auscultation bilaterally. GI: Soft, nontender, nondistended, BS + x 4. MS: no deformity or atrophy. Skin: warm and dry, no rash. Neuro:  Strength and sensation are intact. Psych: Normal affect.    Accessory Clinical Findings    ECG personally reviewed by me today - Sinus bradycardia, 57 bpm, PR interval prolonged at 204 ms, right bundle branch block, QTC 441 ms, no acute changes from previous EKG. - no acute changes.  VITALS Reviewed today   Temp Readings from Last 3 Encounters:  11/08/20 98.7 F (37.1 C) (Temporal)  03/25/20 (!) 97.5 F (36.4 C)  12/11/19 98.4 F (36.9 C) (Oral)   BP Readings from Last 3 Encounters:  11/15/20 120/90  11/08/20 122/80  09/21/20 136/82   Pulse Readings from Last 3 Encounters:  11/15/20 (!) 57  11/08/20 64  09/21/20 62    Wt Readings from Last 3 Encounters:  11/15/20  186 lb 8 oz (84.6 kg)  11/08/20 187 lb (84.8 kg)  09/21/20 186 lb (84.4 kg)     LABS  reviewed today   Lab Results  Component Value Date   WBC 6.4 09/28/2020   HGB 14.7 09/28/2020   HCT 42.2 09/28/2020   MCV 86 09/28/2020   PLT 124 (L) 09/28/2020   Lab Results  Component Value Date   CREATININE 1.27 09/28/2020   BUN 20 09/28/2020   NA 144 09/28/2020   K 4.3 09/28/2020   CL 106 09/28/2020   CO2 23 09/28/2020   Lab Results  Component Value Date   ALT 16 09/28/2020   AST 21 09/28/2020   ALKPHOS 93 09/28/2020   BILITOT 1.6 (H) 09/28/2020   Lab Results  Component Value Date   CHOL 114 09/28/2020   HDL 28 (L) 09/28/2020   LDLCALC 42 09/28/2020   LDLDIRECT 64.2 12/29/2008   TRIG 284 (H) 09/28/2020   CHOLHDL 4.0 09/28/2020    No results found for: HGBA1C Lab Results  Component Value Date   TSH 2.55 12/29/2008     STUDIES/PROCEDURES reviewed today   LHC 11/2019 Conclusions: Significant two-vessel coronary artery disease with sequential 60% and 40% proximal/mid LAD stenoses that are not hemodynamically significant (iFR 0.92, FFR 0.84), as well as chronic total occlusions of medium-caliber D1 and small, non-dominant RCA.  Mild to moderate plaquing noted in OM/lPDA branches. Normal left ventricular systolic function with mildly elevated filling pressure. Recommendations: Escalate antianginal therapy; will have patient start taking isosorbide mononitrate 15 mg daily, as prescribed earlier this week.  If he has refractory symptoms despite maximal tolerated doses of at least two antianginal medications, PCI to CTO of D1 could be considered. Aggressive secondary prevention; will add rosuvastatin 5 mg daily. Left Heart  Left Ventricle The left ventricular size is normal. The left ventricular systolic function is normal. LV end diastolic pressure is mildly elevated. LVEDP 20-22 mmHg. The left ventricular ejection fraction is greater than 65% by visual estimate. No regional  wall motion abnormalities.  Aortic Valve There is no aortic valve stenosis.   Coronary Diagrams  Diagnostic Dominance: Left Intervention Echo 09/2019  1. Left ventricular ejection fraction, by estimation, is 60 to 65%. The  left ventricle has normal function. The left ventricle has no regional  wall motion abnormalities. Left ventricular diastolic parameters are  consistent with Grade II diastolic  dysfunction (pseudonormalization). Elevated left atrial pressure.   2. Right ventricular systolic function is normal. The right ventricular  size is normal. There  is normal pulmonary artery systolic pressure. The  estimated right ventricular systolic pressure is 41.7 mmHg.   3. The mitral valve is normal in structure. Trivial mitral valve  regurgitation. No evidence of mitral stenosis.   4. The aortic valve is normal in structure. Aortic valve regurgitation is  not visualized. No aortic stenosis is present.   5. The inferior vena cava is normal in size with greater than 50%  respiratory variability, suggesting right atrial pressure of 3 mmHg.   MPI 09/2019 IMPRESSION: 1. No evidence of inducible myocardial ischemia. 2. Normal left ventricular wall motion. 3. Left ventricular ejection fraction 58% 4. Non invasive risk stratification*: Low  Assessment & Plan    Coronary artery disease with stable angina -- Appointment scheduled for PCP report of increased chest pain.  Today he denies any increasing chest pain and actually reports improvement in chest pain.  He states his main concern has been over his lower extremity myalgias.  He reports some ongoing chest discomfort when trying to lift something heavy or overexert himself, even on Imdur 90 mg. Previous 11/2019 LHC with moderate LAD disease not significant by IFR/FFR and ongoing CP thought 2/2 occluded diagonal branch.  Will increase to Imdur 120mg  daily and reassess at follow-up. Discussed possible repeat catheterization if refractory  angina despite at least 2 maximum tolerated doses of antianginal medications as noted by primary MD, and with pt preference to avoid repeat cath if possible. Reassess CP at RTC.  Bradycardia precludes escalation of Coreg/beta-blocker at this time.  Continue aggressive risk factor modification, stressed today. BP control discussed. He reports myalgias with statin therapy with recommendations as below.  Hypertension -- DBP borderline but otherwise well controlled.  Reviewed salt and fluid restrictions.  Continue current medications including Imdur, losartan, carvedilol.  Hyperlipidemia Myalgias with statin / Statin Intolerance --He recently discontinued his statin due to report of myalgias.  Patient politely declined restart of reduced dose Crestor at 5 mg daily +/- Zetia, as well as transition to PCSK9 inhibitors.  He is agreeable to addition of Zetia 10 mg daily at this time.   Repeat lipid and liver function in 6 to 8 weeks to ensure control of LDL with Zetia alone.  If LDL not well controlled on repeat labs, recommend reassess if agreeable to PCSK9 inhibitors/lipid clinic referral at that time.  --------- Medication changes:  --Start Zetia 10mg .  --Increase to Imdur 120mg  daily.   --Pt declined restart of statin and PCSK9i today. Labs ordered:  --Repeat lipid and liver function in 6 to 8 weeks.  If LDL not well controlled on Zetia and pt preference is still to avoid any statin therapy, recommend referral to lipid clinic at that time to discuss PCSK9i.  Studies / Imaging ordered: None Future considerations: Repeat cath with PCI to CTO of D1 could be considered if ongoing/refractory symptoms despite maximum tolerated doses of at least 2 antianginal medications Disposition: RTC as scheduled  *Please be aware that the above documentation was completed voice recognition software and may contain dictation errors.     Arvil Chaco, PA-C 11/15/2020

## 2020-11-15 NOTE — Patient Instructions (Signed)
Medication Instructions:  Your physician has recommended you make the following change in your medication:   1) STOP Crestor 2) START Zetia 10 mg daily. An Rx has been sent to your pharmacy. 3) INCREASE Isosorbide to 120 mg daily. An Rx has been sent to your pharmacy.  *If you need a refill on your cardiac medications before your next appointment, please call your pharmacy*   Lab Work: Your physician recommends that you return for a FASTING lipid profile and lft: 6-8 weeks   If you have labs (blood work) drawn today and your tests are completely normal, you will receive your results only by: [place] (if you have MyChart) OR A paper copy in the mail If you have any lab test that is abnormal or we need to change your treatment, we will call you to review the results.   Testing/Procedures: None ordered   Follow-Up: At [facility], you and your health needs are our priority.  As part of our continuing mission to provide you with exceptional heart care, we have created designated Provider Care Teams.  These Care Teams include your primary Cardiologist (physician) and Advanced Practice Providers (APPs -  Physician Assistants and Nurse Practitioners) who all work together to provide you with the care you need, when you need it.  We recommend signing up for the patient portal called "MyChart".  Sign up information is provided on this After Visit Summary.  MyChart is used to connect with patients for Virtual Visits (Telemedicine).  Patients are able to view lab/test results, encounter notes, upcoming appointments, etc.  Non-urgent messages can be sent to your provider as well.   To learn more about what you can do with MyChart, go to [email or web address].    Your next appointment:   As planned in Feb 2023  The format for your next appointment:   In Person  Provider:   Nelva Bush, MD   Other Instructions N/A

## 2020-11-18 ENCOUNTER — Telehealth: Payer: Self-pay | Admitting: Family Medicine

## 2020-11-18 NOTE — Telephone Encounter (Signed)
Pt called in to make Dr. Damita Dunnings aware that he's no longer taking Crestor medication  [phone]

## 2021-03-06 ENCOUNTER — Other Ambulatory Visit: Payer: Self-pay | Admitting: Internal Medicine

## 2021-03-06 DIAGNOSIS — C44319 Basal cell carcinoma of skin of other parts of face: Secondary | ICD-10-CM | POA: Diagnosis not present

## 2021-03-14 DIAGNOSIS — I2510 Atherosclerotic heart disease of native coronary artery without angina pectoris: Secondary | ICD-10-CM | POA: Diagnosis not present

## 2021-03-14 DIAGNOSIS — I10 Essential (primary) hypertension: Secondary | ICD-10-CM | POA: Diagnosis not present

## 2021-03-14 DIAGNOSIS — E559 Vitamin D deficiency, unspecified: Secondary | ICD-10-CM | POA: Diagnosis not present

## 2021-03-14 DIAGNOSIS — E663 Overweight: Secondary | ICD-10-CM | POA: Diagnosis not present

## 2021-03-14 DIAGNOSIS — K219 Gastro-esophageal reflux disease without esophagitis: Secondary | ICD-10-CM | POA: Diagnosis not present

## 2021-03-20 DIAGNOSIS — L905 Scar conditions and fibrosis of skin: Secondary | ICD-10-CM | POA: Diagnosis not present

## 2021-03-20 DIAGNOSIS — D0462 Carcinoma in situ of skin of left upper limb, including shoulder: Secondary | ICD-10-CM | POA: Diagnosis not present

## 2021-03-28 NOTE — Progress Notes (Signed)
Follow-up Outpatient Visit Date: 03/29/2021  Primary Care Provider: Tonia Ghent, MD [place] [address]  Chief Complaint: Follow-up coronary artery disease  HPI:  [name] is a 76 y.o. male with history of coronary artery disease being managed medically (chronic total occlusions of D1 and nondominant RCA as well as moderate proximal LAD disease that was not significant by iFR/FFR), hypertension, and ulcerative colitis, who presents for follow-up of coronary artery disease.  He was last seen in our office in 10/2020 by Marrianne Mood, PA, at which time he complained primarily of myalgias and generalized weakness in his legs.  He had stable chest pain with strenuous activity.  Isosorbide mononitrate was increased to 120 mg daily.  [name] had previously discontinued rosuvastatin due to myalgias.  He was started on ezetimibe 10 mg daily.  Lipid panel in August showed excellent LDL 42 but suboptimal HDL (28), and triglycerides (284); patient was on rosuvastatin at that time.  Today, [name] reports that he is feeling fairly well.  He has not had any further chest pain since isosorbide mononitrate was increased at his last visit.  He denies headaches and other side effects.  He notes minimal myalgias with ezetimibe, though he ran out of this medication about 2 months ago.  He has not had any shortness of breath, palpitations, lightheadedness, or edema.  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  --------------------------------------------------------------------------------------------------  Past Medical History:  Diagnosis Date   Coronary artery disease 01/2020   Moderate proximal LAD disease (not hemodynamically significant) and CTO's of D1 and non-dominant RCA   HLD (hyperlipidemia)    Hypertension    Ulcerative colitis 5/81   Remission for years   Past Surgical History:  Procedure Laterality Date   CARDIAC CATHETERIZATION   03/20/01   Cardiolite EF 55% 02/10/02   CHOLECYSTECTOMY N/A 07/09/2017   Procedure: LAPAROSCOPIC CHOLECYSTECTOMY;  Surgeon: Judeth Horn, MD;  Location: [place];  Service: General;  Laterality: N/A;   COLONOSCOPY  multiple   ENDOSCOPIC RETROGRADE CHOLANGIOPANCREATOGRAPHY (ERCP) WITH PROPOFOL N/A 07/08/2017   Procedure: ENDOSCOPIC RETROGRADE CHOLANGIOPANCREATOGRAPHY (ERCP) WITH PROPOFOL;  Surgeon: Jackquline Denmark, MD;  Location[facility] ENDOSCOPY;  Service: Endoscopy;  Laterality: N/A;   INGUINAL HERNIA REPAIR  04/09/06   Bilateral   LAPAROSCOPIC APPENDECTOMY  03/1981   LEFT HEART CATH AND CORONARY ANGIOGRAPHY Left 12/11/2019   Procedure: LEFT HEART CATH AND CORONARY ANGIOGRAPHY;  Surgeon: Nelva Bush, MD;  Location: [place] [facility] LAB;  Service: Cardiovascular;  Laterality: Left;   REMOVAL OF STONES  07/08/2017   Procedure: REMOVAL OF STONES;  Surgeon: Jackquline Denmark, MD;  Location: [facility] ENDOSCOPY;  Service: Endoscopy;;   SPHINCTEROTOMY  07/08/2017   Procedure: Joan Mayans;  Surgeon: Jackquline Denmark, MD;  Location: [facility] ENDOSCOPY;  Service: Endoscopy;;    Current Meds  Medication Sig   Ascorbic Acid (VITAMIN C) 1000 MG tablet Take 1,000 mg by mouth daily.   aspirin EC 81 MG tablet Take 1 tablet (81 mg total) by mouth daily. Swallow whole.   carvedilol (COREG) 3.125 MG tablet TAKE 1 TABLET(3.125 MG) BY MOUTH TWICE DAILY   cholecalciferol (VITAMIN D3) 25 MCG (1000 UNIT) tablet Take 2,000 Units by mouth daily.    Coenzyme Q10 100 MG capsule Take 100 mg by mouth daily.   famotidine (PEPCID) 20 MG tablet Take 1 tablet (20 mg total) by mouth 2 (two) times daily as needed for heartburn or indigestion.   Garlic 017  MG TABS Take 500 mg by mouth daily.   isosorbide mononitrate (IMDUR) 120 MG 24 hr tablet Take 1 tablet (120 mg total) by mouth daily.   losartan (COZAAR) 25 MG tablet Take 1 tablet (25 mg total) by mouth daily.   nitroGLYCERIN (NITROSTAT) 0.4 MG SL tablet Place 1 tablet (0.4 mg total) under the  tongue every 5 (five) minutes as needed for chest pain. Maximum of 3 doses.   omeprazole (PRILOSEC) 20 MG capsule Take 20 mg by mouth daily as needed.   [DISCONTINUED] ezetimibe (ZETIA) 10 MG tablet Take 1 tablet (10 mg total) by mouth daily.    Allergies: Amoxicillin, Lyrica [pregabalin], Morphine sulfate, Neurontin [gabapentin], and Sulfonamide derivatives  Social History   Tobacco Use   Smoking status: Never   Smokeless tobacco: Never  Vaping Use   Vaping Use: Never used  Substance Use Topics   Alcohol use: No   Drug use: No    Family History  Problem Relation Age of Onset   Cancer Mother        ? GYN, died after hysterectomy   Stroke Father    CAD Father    Hypertension Sister    Prostate cancer Brother    CAD Brother 62       CABG   Bladder Cancer Brother 70       Bladder   Hypertension Brother    Hypertension Brother    Hypertension Brother    Hypertension Brother    Hypertension Brother    Bone cancer Paternal Grandfather    Hypertension Son    Prostate cancer Nephew    Bladder Cancer Nephew    Colon cancer Neg Hx    Esophageal cancer Neg Hx    Rectal cancer Neg Hx    Stomach cancer Neg Hx     Review of Systems: A 12-system review of systems was performed and was negative except as noted in the HPI.  --------------------------------------------------------------------------------------------------  Physical Exam: BP 124/90 (BP Location: Left Arm, Patient Position: Sitting, Cuff Size: Normal)    Pulse (!) 59    Ht 5\' 7"  (1.702 m)    Wt 181 lb (82.1 kg)    SpO2 98%    BMI 28.35 kg/m   General:  NAD. Neck: No JVD or HJR. Lungs: Clear to auscultation bilaterally without wheezes or crackles. Heart: Regular rate and rhythm without murmurs, rubs, or gallops. Abdomen: Soft, nontender, nondistended. Extremities: No lower extremity edema.  EKG: Sinus bradycardia with right bundle branch block and borderline LVH.  No significant change from prior tracing on  11/15/2020.  Lab Results  Component Value Date   WBC 6.4 09/28/2020   HGB 14.7 09/28/2020   HCT 42.2 09/28/2020   MCV 86 09/28/2020   PLT 124 (L) 09/28/2020    Lab Results  Component Value Date   NA 144 09/28/2020   K 4.3 09/28/2020   CL 106 09/28/2020   CO2 23 09/28/2020   BUN 20 09/28/2020   CREATININE 1.27 09/28/2020   GLUCOSE 101 (H) 09/28/2020   ALT 16 09/28/2020    Lab Results  Component Value Date   CHOL 114 09/28/2020   HDL 28 (L) 09/28/2020   LDLCALC 42 09/28/2020   LDLDIRECT 64.2 12/29/2008   TRIG 284 (H) 09/28/2020   CHOLHDL 4.0 09/28/2020    --------------------------------------------------------------------------------------------------  ASSESSMENT AND PLAN: Coronary artery disease with stable angina: No chest pain reported on current regimen of carvedilol and isosorbide mononitrate.  Continue current medications for antianginal relief and  to prevent progression of coronary artery disease.  Hypertension: Blood pressure borderline today with DBP of 90 mmHg.  Continue current regimen of carvedilol, isosorbide mononitrate, and losartan.  Hyperlipidemia: [name] has been intolerant of statins due to myalgias.  He was doing well with his ezetimibe but ran out of his prescription a few months ago.  We will restart ezetimibe 10 mg daily with plans to repeat a lipid panel and CMP in about 3 months.  If LDL remains above 70, addition of PCSK9 inhibitor or bempedoic acid would need to be considered.  Follow-up: Return to clinic in 6 months.  Nelva Bush, MD 03/29/2021 1:40 PM

## 2021-03-29 ENCOUNTER — Other Ambulatory Visit: Payer: Self-pay

## 2021-03-29 ENCOUNTER — Encounter: Payer: Self-pay | Admitting: Internal Medicine

## 2021-03-29 ENCOUNTER — Ambulatory Visit: Payer: PPO | Admitting: Internal Medicine

## 2021-03-29 VITALS — BP 124/90 | HR 59 | Ht 67.0 in | Wt 181.0 lb

## 2021-03-29 DIAGNOSIS — I10 Essential (primary) hypertension: Secondary | ICD-10-CM | POA: Diagnosis not present

## 2021-03-29 DIAGNOSIS — I25118 Atherosclerotic heart disease of native coronary artery with other forms of angina pectoris: Secondary | ICD-10-CM | POA: Diagnosis not present

## 2021-03-29 DIAGNOSIS — E785 Hyperlipidemia, unspecified: Secondary | ICD-10-CM | POA: Diagnosis not present

## 2021-03-29 MED ORDER — EZETIMIBE 10 MG PO TABS: 10.0000 mg | ORAL_TABLET | Freq: Every day | ORAL | 3 refills | Status: DC

## 2021-03-29 NOTE — Patient Instructions (Signed)
Medication Instructions:   Your physician recommends that you continue on your current medications as directed. Please refer to the Current Medication list given to you today.  *If you need a refill on your cardiac medications before your next appointment, please call your pharmacy*   Lab Work:  Your physician recommends that you return for FASTING lab work in: 3 MONTHS (Lipid/CMET)   Testing/Procedures:  None ordered   Follow-Up: At [facility], you and your health needs are our priority.  As part of our continuing mission to provide you with exceptional heart care, we have created designated Provider Care Teams.  These Care Teams include your primary Cardiologist (physician) and Advanced Practice Providers (APPs -  Physician Assistants and Nurse Practitioners) who all work together to provide you with the care you need, when you need it.  We recommend signing up for the patient portal called "MyChart".  Sign up information is provided on this After Visit Summary.  MyChart is used to connect with patients for Virtual Visits (Telemedicine).  Patients are able to view lab/test results, encounter notes, upcoming appointments, etc.  Non-urgent messages can be sent to your provider as well.   To learn more about what you can do with MyChart, go to [email or web address].    Your next appointment:   6 month(s)  The format for your next appointment:   In Person  Provider:   Nelva Bush, MD

## 2021-06-06 ENCOUNTER — Telehealth: Payer: Self-pay | Admitting: Family Medicine

## 2021-06-06 MED ORDER — TIZANIDINE HCL 4 MG PO TABS: 2.0000 mg | ORAL_TABLET | Freq: Three times a day (TID) | ORAL | 0 refills | Status: DC | PRN

## 2021-06-06 NOTE — Telephone Encounter (Signed)
Could try tizanidine with sedation caution and seek care if not better or if fever/weakness.  Thanks.  Rx sent.   ?

## 2021-06-06 NOTE — Telephone Encounter (Signed)
Pt wife called stating that pt pulled a muscle in his back and is now in the bed with a heating pad. Pt wife is asking if you can call something in for pain. Please advise. ?

## 2021-06-06 NOTE — Telephone Encounter (Signed)
Patient notified rx was sent and advised about sedation caution. ?

## 2021-06-07 ENCOUNTER — Telehealth: Payer: Self-pay | Admitting: Internal Medicine

## 2021-06-07 MED ORDER — ISOSORBIDE MONONITRATE ER 120 MG PO TB24: 120.0000 mg | ORAL_TABLET | Freq: Every day | ORAL | 0 refills | Status: DC

## 2021-06-07 NOTE — Telephone Encounter (Signed)
Requested Prescriptions  ? ?Signed Prescriptions Disp Refills  ? isosorbide mononitrate (IMDUR) 120 MG 24 hr tablet 90 tablet 0  ?  Sig: Take 1 tablet (120 mg total) by mouth daily.  ?  Authorizing Provider: END, CHRISTOPHER  ?  Ordering User: NEWCOMER MCCLAIN, Jasan Doughtie L  ? ? ?

## 2021-06-07 NOTE — Telephone Encounter (Signed)
?*  STAT* If patient is at the pharmacy, call can be transferred to refill team. ? ? ?1. Which medications need to be refilled? (please list name of each medication and dose if known)  ? isosorbide mononitrate (IMDUR) 120 MG 24 hr tablet  ? ? ?2. Which pharmacy/location (including street and city if local pharmacy) is medication to be sent to? [id number] - [place], [place] ? ?3. Do they need a 30 day or 90 day supply? 90 day ? ?Pt's wife states that pt is currently out of this medication. Please advise ? ?

## 2021-06-08 DIAGNOSIS — M7918 Myalgia, other site: Secondary | ICD-10-CM | POA: Diagnosis not present

## 2021-06-08 DIAGNOSIS — M5451 Vertebrogenic low back pain: Secondary | ICD-10-CM | POA: Diagnosis not present

## 2021-06-08 DIAGNOSIS — M9903 Segmental and somatic dysfunction of lumbar region: Secondary | ICD-10-CM | POA: Diagnosis not present

## 2021-06-08 DIAGNOSIS — M9904 Segmental and somatic dysfunction of sacral region: Secondary | ICD-10-CM | POA: Diagnosis not present

## 2021-06-13 ENCOUNTER — Telehealth: Payer: Self-pay | Admitting: *Deleted

## 2021-06-13 DIAGNOSIS — Z79899 Other long term (current) drug therapy: Secondary | ICD-10-CM

## 2021-06-13 DIAGNOSIS — E785 Hyperlipidemia, unspecified: Secondary | ICD-10-CM

## 2021-06-13 DIAGNOSIS — I25118 Atherosclerotic heart disease of native coronary artery with other forms of angina pectoris: Secondary | ICD-10-CM

## 2021-06-13 NOTE — Telephone Encounter (Signed)
-----   Message from Solmon Ice, RN sent at 03/30/2021  4:51 PM EST ----- ?Regarding: 3 mo labs ?Pt seen by Dr. Saunders Revel 03/29/21. Needs 3 month fasting labs (lipid panel, CMET). Follow up scheduling.  ? ?

## 2021-06-13 NOTE — Telephone Encounter (Signed)
Pt aware to have fasting labs at the medical mall at [facility] within the next 1-2 weeks.  ?Pt understands instructions for labs at [facility].  ?Lab orders updated for [facility].  ? ?Pt has no further questions at this time.  ?

## 2021-06-27 ENCOUNTER — Other Ambulatory Visit: Payer: Self-pay | Admitting: *Deleted

## 2021-06-27 DIAGNOSIS — Z79899 Other long term (current) drug therapy: Secondary | ICD-10-CM

## 2021-06-27 DIAGNOSIS — E785 Hyperlipidemia, unspecified: Secondary | ICD-10-CM

## 2021-06-27 DIAGNOSIS — I25118 Atherosclerotic heart disease of native coronary artery with other forms of angina pectoris: Secondary | ICD-10-CM

## 2021-06-28 ENCOUNTER — Other Ambulatory Visit
Admission: RE | Admit: 2021-06-28 | Discharge: 2021-06-28 | Disposition: A | Discharge status: Home / Self Care | Payer: PPO | Attending: Internal Medicine | Admitting: Internal Medicine

## 2021-06-28 DIAGNOSIS — E785 Hyperlipidemia, unspecified: Secondary | ICD-10-CM | POA: Insufficient documentation

## 2021-06-28 DIAGNOSIS — I25118 Atherosclerotic heart disease of native coronary artery with other forms of angina pectoris: Secondary | ICD-10-CM | POA: Insufficient documentation

## 2021-06-28 DIAGNOSIS — Z79899 Other long term (current) drug therapy: Secondary | ICD-10-CM | POA: Diagnosis not present

## 2021-06-28 LAB — COMPREHENSIVE METABOLIC PANEL WITH GFR
ALT: 18 U/L (ref 0–44)
AST: 26 U/L (ref 15–41)
Albumin: 3.7 g/dL (ref 3.5–5.0)
Alkaline Phosphatase: 60 U/L (ref 38–126)
Anion gap: 4 — ABNORMAL LOW (ref 5–15)
BUN: 21 mg/dL (ref 8–23)
CO2: 26 mmol/L (ref 22–32)
Calcium: 8.5 mg/dL — ABNORMAL LOW (ref 8.9–10.3)
Chloride: 109 mmol/L (ref 98–111)
Creatinine, Ser: 1.11 mg/dL (ref 0.61–1.24)
GFR, Estimated: >60 mL/min (ref >60)
Glucose, Bld: 118 mg/dL — ABNORMAL HIGH (ref 70–99)
Potassium: 4.1 mmol/L (ref 3.5–5.1)
Sodium: 139 mmol/L (ref 135–145)
Total Bilirubin: 1.9 mg/dL — ABNORMAL HIGH (ref 0.3–1.2)
Total Protein: 6.4 g/dL — ABNORMAL LOW (ref 6.5–8.1)

## 2021-06-28 LAB — LIPID PANEL
Cholesterol: 116 mg/dL (ref 0–200)
HDL: 23 mg/dL — ABNORMAL LOW (ref >40)
LDL Cholesterol: UNDETERMINED mg/dL (ref 0–99)
Total CHOL/HDL Ratio: 5 RATIO
Triglycerides: 442 mg/dL — ABNORMAL HIGH (ref <150)
VLDL: UNDETERMINED mg/dL (ref 0–40)

## 2021-06-28 LAB — LDL CHOLESTEROL, DIRECT: Direct LDL: 35.2 mg/dL (ref 0–99)

## 2021-06-29 ENCOUNTER — Telehealth: Payer: Self-pay | Admitting: *Deleted

## 2021-06-29 DIAGNOSIS — Z79899 Other long term (current) drug therapy: Secondary | ICD-10-CM

## 2021-06-29 DIAGNOSIS — E781 Pure hyperglyceridemia: Secondary | ICD-10-CM

## 2021-06-29 DIAGNOSIS — E785 Hyperlipidemia, unspecified: Secondary | ICD-10-CM

## 2021-06-29 IMAGING — NM NM MYOCAR MULTI W/SPECT W/WALL MOTION & EF
1 series · 6 of 6 positions shown · non-contrast
Comparison: None.

CLINICAL DATA: Chest pain.

EXAM:
MYOCARDIAL IMAGING WITH SPECT (REST AND PHARMACOLOGIC-STRESS)
GATED LEFT VENTRICULAR WALL MOTION STUDY
LEFT VENTRICULAR EJECTION FRACTION
TECHNIQUE: Standard myocardial SPECT imaging was performed after resting
intravenous injection of 10 mCi Vc-OOm tetrofosmin. Subsequently,
intravenous infusion of Lexiscan was performed under the supervision
of the Cardiology staff. At peak effect of the drug, 30 mCi Vc-OOm
tetrofosmin was injected intravenously and standard myocardial SPECT
imaging was performed. Quantitative gated imaging was also performed
to evaluate left ventricular wall motion, and estimate left
ventricular ejection fraction.

[Series 1: wbr_r-card_st rest · 6.5mm · 6.51mm/px · 6 of 22 frames shown]
[frame 2/22]
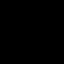
[frame 6/22]
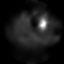
[frame 10/22]
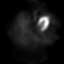
[frame 13/22]
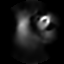
[frame 17/22]
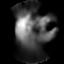
[frame 21/22]
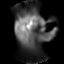

[6 of 6 positions shown; findings below may reference images not displayed]

FINDINGS: Perfusion: There is some probable diaphragmatic attenuation of the
inferior wall on both rest and stress studies affecting primarily
the mid to basilar aspect of the inferior wall. There is no evidence
of convincing inducible myocardial ischemia or scar.

Wall Motion: Normal left ventricular wall motion. No left
ventricular dilation.

Left Ventricular Ejection Fraction: 58 %

End diastolic volume 89 ml

End systolic volume 37 ml
IMPRESSION: 1. No evidence of inducible myocardial ischemia.

2. Normal left ventricular wall motion.

3. Left ventricular ejection fraction 58%

4. Non invasive risk stratification*: Low

*1651 Appropriate Use Criteria for Coronary Revascularization
Focused Update: J Am Coll Cardiol. 1651;59(9):857-881.
[URL]

## 2021-06-29 MED ORDER — ICOSAPENT ETHYL 1 G PO CAPS: 2.0000 g | ORAL_CAPSULE | Freq: Two times a day (BID) | ORAL | 3 refills | Status: DC

## 2021-06-29 NOTE — Telephone Encounter (Signed)
-----   Message from Nelva Bush, MD sent at 06/28/2021  3:16 PM EDT ----- ?Please let Mr. Cocco know that his direct LDL is excellent.  Given his hypertriglyceridemia and coronary artery disease, I suggest that we add Vascepa 2 gm twice daily.  If this is cost prohibitive, he could try taking over-the-counter fish oil 2 gm twice daily instead.  We should plan to repeat a fasting lipid panel in 3 months.  In the meantime, I encouraged Mr. Ludden to also focus on lifestyle modifications, including avoidance of high carbohydrate and animal fat based meals.  Following a Mediterranean diet could help improve his triglycerides, too. ?

## 2021-06-29 NOTE — Telephone Encounter (Signed)
Spoke with pt, notified of lab results and Dr. Darnelle Bos recc.  ?Pt voiced understanding. Pt agreeable to start Vascepa 2 gm twice daily.  ?Rx sent to pt's pharmacy. Pt will call us to let us know if this is cost prohibitive.  ?If so, pt will take otc fish oil 2 gm twice daily instead and will let us know so we can update pt's chart.  ? ?Lab orders placed for 3 months. Notified pt I will contact him closer to 3 months to give instructions on lab location.  ? ?Mailed pt Mediterranean diet information.  ? ?Pt appreciative and has no further questions at this time.  ?

## 2021-07-03 ENCOUNTER — Telehealth: Payer: Self-pay

## 2021-07-03 NOTE — Telephone Encounter (Signed)
Prior Authorization required from [employer] for Vascepa 1 gram capsules. PA completed in [email or web address] KEY: [id number] ? ?RESPONSE: [employer] has not yet replied to your PA request.  ? ?If [employer] has not replied to your request within 24 hours for expedited requests and 72 hours for standard requests please contact [employer] at [phone]. ? ?Waiting for determination. ?

## 2021-07-04 NOTE — Telephone Encounter (Signed)
Per fax received from [employer], icosapent ethyl 1 gm capsules have been approved from 07/03/21 to 07/04/22.  ?Request ID# [id number] ?

## 2021-08-07 ENCOUNTER — Ambulatory Visit: Payer: PPO

## 2021-08-07 DIAGNOSIS — D225 Melanocytic nevi of trunk: Secondary | ICD-10-CM | POA: Insufficient documentation

## 2021-08-07 DIAGNOSIS — L570 Actinic keratosis: Secondary | ICD-10-CM | POA: Insufficient documentation

## 2021-08-07 DIAGNOSIS — Z85828 Personal history of other malignant neoplasm of skin: Secondary | ICD-10-CM | POA: Insufficient documentation

## 2021-08-07 DIAGNOSIS — L0291 Cutaneous abscess, unspecified: Secondary | ICD-10-CM | POA: Insufficient documentation

## 2021-08-08 ENCOUNTER — Encounter: Payer: Self-pay | Admitting: Family Medicine

## 2021-08-08 ENCOUNTER — Ambulatory Visit (INDEPENDENT_AMBULATORY_CARE_PROVIDER_SITE_OTHER): Payer: PPO | Admitting: Family Medicine

## 2021-08-08 VITALS — Ht 67.0 in | Wt 175.0 lb

## 2021-08-08 DIAGNOSIS — Z0000 Encounter for general adult medical examination without abnormal findings: Secondary | ICD-10-CM | POA: Diagnosis not present

## 2021-08-08 NOTE — Progress Notes (Signed)
PATIENT CHECK-IN and HEALTH RISK ASSESSMENT QUESTIONNAIRE:  -completed by phone/video for upcoming Medicare Preventive Visit  Pre-Visit Check-in: 1)Vitals (height, wt, BP, etc) - record in vitals section for visit on day of visit 2)Review and Update Medications, Allergies PMH, Surgeries, Social history in Epic 3)Hospitalizations in the last year with date/reason? none 4)Review and Update Care Team (patient's specialists) in Epic 5) Complete PHQ9 in Epic  6) Complete Fall Screening in Epic 7)Review all Health Maintenance Due and order under PCP if not done.  Medicare Wellness Patient Questionnaire:  Answer theses question about your habits: Do you drink alcohol? none How many drinks do you have a day? none Have you ever smoked?never Have you stopped smoking and date if applicable? never How many packs a day do you smoke? none Do you use smokeless tobacco? none Do you use an illicit drugs? none Do you exercises? None - "I know I need too" ; job is very active so move a lot. Mows the yard with a push mower.  Are you sexually active? no What did you eat for breakfast today (or yesterday)? Cereal - cherrios, 1% milk, blueberries, 1/2 banana.Typical breakfast: typical What did you eat for lunch today (or yesterday)?  Peanut butter crackers, water Typical lunch: yes - he is working and does not have time for full sit down lunch What did you eat for dinner today (or yesterday)? Pasta, salad, bread - whole wheat.Typical dinner: yes Typical snacks: nuts, occasional cookie What beverages do you drink besides water: water, sometimes sweet tea - a few times per week  Answer theses question about you: Can you perform most household chores? yes Do you find it hard to follow a conversation in a noisy room? yes Do you find it hard to understand a speaker at church or in a meeting? yes Do you often ask people to speak up or repeat themselves? no Do you experience ringing in your ears? no Do you  have difficulty understanding a soft or whispered voice? no Do you feel that you have a problem with memory? Some but manages business fine, sometimes has to ask someone to repeat something if taking notes Do you often misplace items? no Do you balance your checkbook and or bank acounts? yes Do you feel safe at home? yes Last dentist visit? Has dentures, doesn't go to dentis often Do you need assistance with any of the following:  Driving? no  Feeding yourself? no  Getting from bed to chair?no  Getting to the toilet? no  Bathing or showering?no  Dressing yourself? no  Managing money?no  Climbing a flight of stairs?no  Preparing meals?no  Do you have Advanced Directives in place (Living Will, Healthcare Power or Attorney)?  Yes, reports Wife is his 66 - is interested in more info   Last eye Exam and location? Yearly, with Dr. Michaelene [facility]   Do you currently use prescribed or non-prescribed narcotic or opioid pain medications? none  Do you have a history or close family history of breast, ovarian, tubal or peritoneal cancer or a family member with BRCA (breast cancer susceptibility 1 and 2) gene mutations? N/a  Nurse/Assistant Credentials/time stamp: completed by Dr. Maudie Mercury   ----------------------------------------------------------------------------------------------------------------------------------------------------------------------------------------------------------------------    [place] (Welcome to [facility], initial annual wellness or annual wellness exam)  Virtual Visit via Phone Note  I connected with Billy Kim  on 08/08/21 by phone and verified that I am speaking with the correct person using two identifiers.  Location patient: home Location provider:work or home office Persons participating in the virtual visit: patient, provider; after the visit his wife asked to speak with me and she was upset that we  had asked her husband some personal questions regarding what he ate. I did apologized to her that this made her uncomfortable and explained the intent of asking about current diet was to help, as many things in the diet can impact his cholesterol and heart disease. She also thought that someone might have told her this visit was mandatory, but she had never had a medicare visit so this made her concerned. I explained that to my knowledge it was not mandatory, but rather an offered service traditionally covered by medicare to help keep patients in optimal health.   Concerns and/or follow up today: None.   See HM section in Epic for other details of completed HM.    ROS: negative for report of fevers, unintentional weight loss, vision changes, vision loss, hearing loss or change, chest pain, sob, hemoptysis, melena, hematochezia, hematuria, genital discharge or lesions, falls, bleeding or bruising, loc, thoughts of suicide or self harm, memory loss  Patient-completed extensive health risk assessment - reviewed and discussed with the patient: See Health Risk Assessment completed with patient prior to the visit either above or in recent phone note. This was reviewed in detailed with the patient today and appropriate recommendations, orders and referrals were placed as needed per Summary below and patient instructions.   Review of Medical History: -PMH, PSH, Family History and current specialty and care providers reviewed and updated and listed below   Patient Care Team: Tonia Ghent, MD as PCP - General (Family Medicine) End, Harrell Gave, MD as PCP - Cardiology (Cardiology)   Past Medical History:  Diagnosis Date   Coronary artery disease 01/2020   Moderate proximal LAD disease (not hemodynamically significant) and CTO's of D1 and non-dominant RCA   HLD (hyperlipidemia)    Hypertension    Ulcerative colitis 5/81   Remission for years    Past Surgical History:  Procedure Laterality  Date   CARDIAC CATHETERIZATION  03/20/01   Cardiolite EF 55% 02/10/02   CHOLECYSTECTOMY N/A 07/09/2017   Procedure: LAPAROSCOPIC CHOLECYSTECTOMY;  Surgeon: Judeth Horn, MD;  Location: [place];  Service: General;  Laterality: N/A;   COLONOSCOPY  multiple   ENDOSCOPIC RETROGRADE CHOLANGIOPANCREATOGRAPHY (ERCP) WITH PROPOFOL N/A 07/08/2017   Procedure: ENDOSCOPIC RETROGRADE CHOLANGIOPANCREATOGRAPHY (ERCP) WITH PROPOFOL;  Surgeon: Jackquline Denmark, MD;  Location[facility] ENDOSCOPY;  Service: Endoscopy;  Laterality: N/A;   INGUINAL HERNIA REPAIR  04/09/06   Bilateral   LAPAROSCOPIC APPENDECTOMY  03/1981   LEFT HEART CATH AND CORONARY ANGIOGRAPHY Left 12/11/2019   Procedure: LEFT HEART CATH AND CORONARY ANGIOGRAPHY;  Surgeon: Nelva Bush, MD;  Location: [place] CV LAB;  Service: Cardiovascular;  Laterality: Left;   REMOVAL OF STONES  07/08/2017   Procedure: REMOVAL OF STONES;  Surgeon: Jackquline Denmark, MD;  Location: [facility] ENDOSCOPY;  Service: Endoscopy;;   SPHINCTEROTOMY  07/08/2017   Procedure: Joan Mayans;  Surgeon: Jackquline Denmark, MD;  Location: 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   Social Determinants of Health   Financial Resource Strain: Low Risk  (08/04/2020)   Overall Financial Resource Strain (CARDIA)    Difficulty of Paying Living  Expenses: Not hard at all  Food Insecurity: No Food Insecurity (08/04/2020)   Hunger Vital Sign    Worried About Running Out of Food in the Last Year: Never true    Ran Out of Food in the Last Year: Never true  Transportation Needs: No Transportation Needs (08/04/2020)   PRAPARE - Hydrologist (Medical): No    Lack of Transportation (Non-Medical): No  Physical Activity: Inactive (08/04/2020)   Exercise Vital Sign    Days of Exercise per Week: 0 days    Minutes of Exercise per Session: 0 min  Stress: No Stress Concern Present (08/04/2020)   [place]    Feeling of Stress : Not at all  Social Connections: Not on file  Intimate Partner Violence: Not At Risk (08/04/2020)   Humiliation, Afraid, Rape, and Kick questionnaire    Fear of Current or Ex-Partner: No    Emotionally Abused: No    Physically Abused: No    Sexually Abused: No    Family History  Problem Relation Age of Onset   Cancer Mother        ? GYN, died after hysterectomy   Stroke Father    CAD Father    Hypertension Sister    Prostate cancer Brother    CAD Brother 1       CABG   Bladder Cancer Brother 75       Bladder   Hypertension Brother    Hypertension Brother    Hypertension Brother    Hypertension Brother    Hypertension Brother    Bone cancer Paternal Grandfather    Hypertension Son    Prostate cancer Nephew    Bladder Cancer Nephew    Colon cancer Neg Hx    Esophageal cancer Neg Hx    Rectal cancer Neg Hx    Stomach cancer Neg Hx     Current Outpatient Medications on File Prior to Visit  Medication Sig Dispense Refill   Ascorbic Acid (VITAMIN C) 1000 MG tablet Take 1,000 mg by mouth daily.     aspirin EC 81 MG tablet Take 1 tablet (81 mg total) by mouth daily. Swallow whole. 90 tablet 3   carvedilol (COREG) 3.125 MG tablet TAKE 1 TABLET(3.125 MG) BY MOUTH TWICE DAILY 180 tablet 3   cholecalciferol (VITAMIN  D3) 25 MCG (1000 UNIT) tablet Take 2,000 Units by mouth daily.      Coenzyme Q10 100 MG capsule Take 100 mg by mouth daily.     ezetimibe (ZETIA) 10 MG tablet Take 1 tablet (10 mg total) by mouth daily. 90 tablet 3   famotidine (PEPCID) 20 MG tablet Take 1 tablet (20 mg total) by mouth 2 (two) times daily as needed for heartburn or indigestion.     Garlic 762 MG TABS Take 500 mg by mouth daily.     icosapent Ethyl (VASCEPA) 1  g capsule Take 2 capsules (2 g total) by mouth 2 (two) times daily. 120 capsule 3   isosorbide mononitrate (IMDUR) 120 MG 24 hr tablet Take 1 tablet (120 mg total) by mouth daily. 90 tablet 0   losartan (COZAAR) 25 MG tablet Take 1 tablet (25 mg total) by mouth daily. 90 tablet 3   nitroGLYCERIN (NITROSTAT) 0.4 MG SL tablet Place 1 tablet (0.4 mg total) under the tongue every 5 (five) minutes as needed for chest pain. Maximum of 3 doses. 25 tablet 2   omeprazole (PRILOSEC) 20 MG capsule Take 20 mg by mouth daily as needed.     tiZANidine (ZANAFLEX) 4 MG tablet Take 0.5-1 tablets (2-4 mg total) by mouth every 8 (eight) hours as needed for muscle spasms (sedation caution). 30 tablet 0   No current facility-administered medications on file prior to visit.    Allergies  Allergen Reactions   Amoxicillin Other (See Comments)    Caused headache Has patient had a PCN reaction causing immediate rash, facial/tongue/throat swelling, SOB or lightheadedness with hypotension: No Has patient had a PCN reaction causing severe rash involving mucus membranes or skin necrosis: No Has patient had a PCN reaction that required hospitalization: No Has patient had a PCN reaction occurring within the last 10 years: No If all of the above answers are "NO", then may proceed with Cephalosporin use.   Lyrica [Pregabalin]     Intolerant, worsening headache   Morphine Sulfate Other (See Comments)    headache   Neurontin [Gabapentin] Other (See Comments)    headache   Sulfonamide Derivatives  Other (See Comments)    headache       Physical Exam There were no vitals filed for this visit. Estimated body mass index is 27.41 kg/m as calculated from the following:   Height as of this encounter: 5' 7" (1.702 m).   Weight as of this encounter: 175 lb (79.4 kg).  EKG (optional): deferred due to virtual visit  GENERAL: sounds alert, orienteand in no acute distress;  PSYCH/NEURO: pleasant and cooperative, no obvious depression or anxiety, speech and thought processing grossly intact, Cognitive function grossly intact  [place] Office Visit from 08/08/2021 in [place] at [facility]  PHQ-9 Total Score 2           08/08/2021   10:03 AM 08/04/2020    8:30 AM 04/29/2018    2:38 PM 04/25/2017    3:33 PM 04/23/2016    8:39 AM  Depression screen PHQ 2/9  Decreased Interest 0 0 0 0 0  Down, Depressed, Hopeless 0 0 0 0 0  PHQ - 2 Score 0 0 0 0 0  Altered sleeping 0 0     Tired, decreased energy 1 0     Change in appetite 0 0     Feeling bad or failure about yourself  0 0     Trouble concentrating 1 0     Moving slowly or fidgety/restless 0 0     Suicidal thoughts 0 0     PHQ-9 Score 2 0     Difficult doing work/chores Not difficult at all Not difficult at all         10/02/2019   12:00 PM 12/11/2019    6:55 AM 08/04/2020    8:29 AM 11/08/2020   10:38 AM 08/08/2021   10:02 AM  Fall Risk  Falls in the past year?   0 0 0  Was there an injury with Fall?   0  0 0  Fall Risk Category Calculator   0 0 0  Fall Risk Category   Low Low Low  Patient Fall Risk Level Low fall risk Moderate fall risk Low fall risk Low fall risk Low fall risk  Patient at Risk for Falls Due to   Medication side effect No Fall Risks No Fall Risks  Fall risk Follow up   Falls evaluation completed;Falls prevention discussed Falls evaluation completed Falls evaluation completed     SUMMARY AND PLAN:  Medicare annual wellness visit, subsequent    The following health maintenance/preventive  care measures were recommended/discussed. The patient was referred if needed and if the patient was agreeable.  Vaccines: due for covid and shingles vaccines (discussed and advised could contact pharmacy if wishes to do)  Lung Cancer Screening if applicable: n/a  Colorectal cancer screening: n/a - discuss with PCP - reports GI said may not need again, last was in 2015  Screening for glaucoma if applicable: sees eye doctor yearly per his report  Statin use for primary prevention if applicable: n/a sees cardiology on lipid medication  Cardiovascular screening blood tests if applicable: done  Diabetes screening tests if applicable: had glu in may  Hepatitis B screening if applicable  HIV/STI screening screening: declined/NA  PrEP if applicable: n/1  Hepatitis C screening: reports done in the past  Abdominal Aortic Aneurysm Screening if applicable: N/A  Prostate cancer screening with PSA if applicable: followed by PCP  Education and counseling on the following was provided based on the above review of health and a plan/checklist for the patient, along with additional information discussed, was provided for the patient in the patient instructions :  -Advised on importance of and resources for completing advanced directives -Provided counseling and plan for function difficulties/ difficulties with ADLs if applicable per above screening. -Advised and counseled on maintaining healthy weight and healthy lifestyle - including the importance of a health diet, regular physical activity, social connections and stress management. -Advised and counseled on a whole foods based healthy diet and regular exercise: discussed a heart healthy whole foods based diet at length. A summary of a healthy diet was provided in the Patient Instructions.Offered referral to dietician/weight management clinic if applicable and follow up virtual visits to assist further and monitor progress. Recommended regular  exercise and discussed options within the community.  -Advised regular eye exams -vaccins  Follow up: see patient instructions   Patient Instructions  CHECKLIST FOR A HEALTHY LIFE:  -Eat a healthy whole foods based diet, get regular physical activity, manage stress and engage in social connections - see below for specific suggestions and more information.  -Vaccines due:  - if you decide to get the shingles and/or covid19 vaccines you can get theses at a pharmacy.  -Tests and screenings due:  -non currently  -Referrals:  -none   -See a dentist at least yearly  -Get your eyes checked and then per your eye specialist's recommendations  -Other issues addressed today: -goal to complete or updated Advance Directives (more info provided below) -please ensure eating Whole grain bread (check that the first ingredient has "whole" in the discription. -try to consume at least 5 servings of vegetables per day -try to increase aerobic exercise to 10 minutes per day or 20 minutes 3-4 days per week (walking, cycling, etc.)  -Follow up: -for your physical with your doctor as planned.  -----------------------------------------------------------------------------------------------------------------------------------------------------------------------------------------------------------------------------------------------------------   ADVANCED HEALTHCARE DIRECTIVES:  Everyone should have advanced health care directives in place. This is so  that you get the care you want, should you ever be in a situation where you are unable to make your own medical decisions.   From the [place] Advanced Directive Website: "[place] are legal documents in which you give written instructions about your health care if, in the future, you cannot speak for yourself.   A health care power of attorney allows you to name a person you trust to make your health care decisions if you cannot make them  yourself. A declaration of a desire for a natural death (or living will) is document, which states that you desire not to have your life prolonged by extraordinary measures if you have a terminal or incurable illness or if you are in a vegetative state. An advance instruction for mental health treatment makes a declaration of instructions, information and preferences regarding your mental health treatment. It also states that you are aware that the advance instruction authorizes a mental health treatment provider to act according to your wishes. It may also outline your consent or refusal of mental health treatment. A declaration of an anatomical gift allows anyone over the age of 75 to make a gift by will, organ donor card or other document."   Please see the following website or an elder law attorney for forms, FAQs and for completion of advanced directives: [place] [facility] [place] ([email or web address].no)  Or copy and paste the following to your web browser: [email or web address]      FOOD - [place]: Food Prescription for you: -eat real food: lots of colorful vegetables (half the plate), small amounts of fresh fruits, fish, nuts, seeds, healthy oils (such as olive oil, avocado oil or organic grass fed unsalted butter), small portions of meat and small portions of whole grains -drink water -try to avoid fast food and pre-packaged foods  -try to avoid foods that contain any ingredients with names you do not recognize  -try to avoid sugar/sweets (except for the natural sugar that occurs in fresh fruit) -try to avoid sweet drinks -try to avoid white rice, white bread, pasta, white or yellow potatoes  MOVE - the key to keeping your body moving and working best: Exercise Prescription for you: -gradually increase intentional physical activity -move and stretch your body, legs, feet and arms when sitting  for long periods -try to get at least 20 minutes of sustained activity or two 10 minute episodes of sustained activity every day at   [place] - so important for health and well being -try meditating, or just sitting quietly with deep breathing while intentionally relaxing all parts of your body for 5 minutes daily  SOCIAL CONNECTIONS: -options in [place] if you wish to engage in more social activities: -Check out the [place] 50+ section on the [place] [employer] (hiking clubs, book clubs, cards and games, chess, exercise classes, aquatic classes and much more) - see the website for details: https://www.[place]-.gov/departments/parks-recreation/active-adults50 -[place] (a variety of indoor and outdoor inperson activities for adults). [phone]. [address]. -Virtual Online Classes (a variety of topics): see seniorplanet.org or call [phone] -consider volunteering at a school, hospice center, church, senior center or elsewhere     Lucretia Kern, DO

## 2021-08-08 NOTE — Patient Instructions (Addendum)
CHECKLIST FOR A HEALTHY LIFE:  -Eat a healthy whole foods based diet, get regular physical activity, manage stress and engage in social connections - see below for specific suggestions and more information.  -Vaccines due:  - if you decide to get the shingles and/or covid19 vaccines you can get theses at a pharmacy.  -Tests and screenings due:  -non currently  -Referrals:  -none   -See a dentist at least yearly  -Get your eyes checked and then per your eye specialist's recommendations  -Other issues addressed today: -goal to complete or updated Advance Directives (more info provided below) -please ensure eating Whole grain bread (check that the first ingredient has "whole" in the discription. -try to consume at least 5 servings of vegetables per day -try to increase aerobic exercise to 10 minutes per day or 20 minutes 3-4 days per week (walking, cycling, etc.)  -Follow up: -for your physical with your doctor as planned.     ADVANCED HEALTHCARE DIRECTIVES:  Everyone should have advanced health care directives in place. This is so that you get the care you want, should you ever be in a situation where you are unable to make your own medical decisions.   From the [place] Advanced Directive Website: "[place] are legal documents in which you give written instructions about your health care if, in the future, you cannot speak for yourself.   A health care power of attorney allows you to name a person you trust to make your health care decisions if you cannot make them yourself. A declaration of a desire for a natural death (or living will) is document, which states that you desire not to have your life prolonged by  extraordinary measures if you have a terminal or incurable illness or if you are in a vegetative state. An advance instruction for mental health treatment makes a declaration of instructions, information and preferences regarding your mental health treatment. It also states that you are aware that the advance instruction authorizes a mental health treatment provider to act according to your wishes. It may also outline your consent or refusal of mental health treatment. A declaration of an anatomical gift allows anyone over the age of 33 to make a gift by will, organ donor card or other document."   Please see the following website or an elder law attorney for forms, FAQs and for completion of advanced directives: [place] ([email or web address])  Or copy and paste the following to your web browser: [email or web address]      FOOD - THE FUEL FOR A HAPPY HEALTHY LIFE: Food Prescription for you: -eat real food: lots of colorful vegetables (half the plate), small amounts of fresh fruits, fish, nuts, seeds, healthy oils (such as olive oil, avocado oil or organic grass fed unsalted butter), small portions of meat and small portions of whole grains -drink water -try to avoid fast food and pre-packaged foods  -try to avoid foods that contain any ingredients with names you do not recognize  -try to avoid sugar/sweets (except for the natural sugar that occurs in fresh fruit) -try to avoid sweet drinks -try to avoid white rice, white bread, pasta, white or yellow potatoes  MOVE - the key to keeping your body moving and working best: Exercise Prescription for you: -gradually increase intentional physical activity -move and stretch your body, legs, feet and arms when sitting for long periods -try to get at least 20 minutes of  sustained activity or two 10 minute episodes of sustained activity every day at   [place] - so important for health and well being -try meditating, or just sitting quietly with deep breathing while intentionally relaxing all parts of your body for 5 minutes daily  SOCIAL CONNECTIONS: -options in [place] if you wish to engage in more social activities: -Check out the [place] 50+ section on the [place] [employer] (hiking clubs, book clubs, cards and games, chess, exercise classes, aquatic classes and much more) - see the website for details: https://www.[place].gov/departments/parks-recreation/active-adults50 -[place] (a variety of indoor and outdoor inperson activities for adults). 6036331361. [address]. -Virtual Online Classes (a variety of topics): see seniorplanet.org or call ([phone] -consider volunteering at a school, hospice center, church, senior center or elsewhere

## 2021-08-29 ENCOUNTER — Telehealth: Payer: Self-pay | Admitting: Internal Medicine

## 2021-08-29 DIAGNOSIS — R079 Chest pain, unspecified: Secondary | ICD-10-CM

## 2021-08-29 MED ORDER — NITROGLYCERIN 0.4 MG SL SUBL: 0.4000 mg | SUBLINGUAL_TABLET | SUBLINGUAL | 1 refills | Status: DC | PRN

## 2021-08-29 NOTE — Telephone Encounter (Signed)
Requested Prescriptions   Signed Prescriptions Disp Refills   nitroGLYCERIN (NITROSTAT) 0.4 MG SL tablet 25 tablet 1    Sig: Place 1 tablet (0.4 mg total) under the tongue every 5 (five) minutes as needed for chest pain. Maximum of 3 doses    Authorizing Provider: END, CHRISTOPHER    Ordering User: NEWCOMER MCCLAIN, Ewen Varnell L    

## 2021-08-29 NOTE — Telephone Encounter (Signed)
*  STAT* If patient is at the pharmacy, call can be transferred to refill team.   1. Which medications need to be refilled? (please list name of each medication and dose if known) nitroGLYCERIN (NITROSTAT) 0.4 MG SL tablet  2. Which pharmacy/location (including street and city if local pharmacy) is medication to be sent to? [facility] [place], [place]  3. Do they need a 30 day or 90 day supply? [place]

## 2021-09-06 ENCOUNTER — Other Ambulatory Visit: Payer: Self-pay | Admitting: Internal Medicine

## 2021-10-06 ENCOUNTER — Other Ambulatory Visit
Admission: RE | Admit: 2021-10-06 | Discharge: 2021-10-06 | Disposition: A | Discharge status: Home / Self Care | Payer: PPO | Attending: Internal Medicine | Admitting: Internal Medicine

## 2021-10-06 DIAGNOSIS — Z79899 Other long term (current) drug therapy: Secondary | ICD-10-CM | POA: Insufficient documentation

## 2021-10-06 DIAGNOSIS — E781 Pure hyperglyceridemia: Secondary | ICD-10-CM | POA: Insufficient documentation

## 2021-10-06 DIAGNOSIS — E785 Hyperlipidemia, unspecified: Secondary | ICD-10-CM | POA: Diagnosis not present

## 2021-10-06 LAB — LIPID PANEL
Cholesterol: 121 mg/dL (ref 0–200)
HDL: 27 mg/dL — ABNORMAL LOW (ref >40)
LDL Cholesterol: 48 mg/dL (ref 0–99)
Total CHOL/HDL Ratio: 4.5 RATIO
Triglycerides: 230 mg/dL — ABNORMAL HIGH (ref <150)
VLDL: 46 mg/dL — ABNORMAL HIGH (ref 0–40)

## 2021-11-01 NOTE — Progress Notes (Unsigned)
Follow-up Outpatient Visit Date: 11/02/2021  Primary Care Provider: Tonia Ghent, MD [place] [address]  Chief Complaint: Dizziness and elevated blood pressure  HPI:  Billy Kim is a 76 y.o. male with history of coronary artery disease being managed medically (chronic total occlusions of D1 and nondominant RCA as well as moderate proximal LAD disease that was not significant by iFR/FFR), hypertension, and ulcerative colitis, who presents for follow-up of coronary artery disease.  I last saw him in February, at which time he was feeling well without further chest pain following escalation of isosorbide mononitrate at our prior visit.  Today, Billy Kim is most concerned today about dizziness that started yesterday morning.  When he woke up, he felt like things were spinning around him.  The sensation has improved but is still bothersome.  He checked his blood pressure twice yesterday because of his dizziness and noted it to be elevated at 158/106 and later at 145/94.  He reports a single episode of chest pain about 2 weeks ago.  He was not exerting himself at the time and took 2 sublingual nitroglycerin tablets with resolution of the pain.  It has not recurred, including with strenuous activities.  He denies shortness of breath and palpitations.  His ankle swell a little bit from time to time but overall have been stable.  He has been compliant with his medications.  Billy Kim is also concerned about longstanding erectile dysfunction.  He notes that he is unable to have an erection and inquires about potential treatment options for this.  --------------------------------------------------------------------------------------------------  Past Medical History:  Diagnosis Date   Coronary artery disease 01/2020   Moderate proximal LAD disease (not hemodynamically significant) and CTO's of D1 and non-dominant RCA   HLD (hyperlipidemia)    Hypertension    Ulcerative  colitis 5/81   Remission for years   Past Surgical History:  Procedure Laterality Date   CARDIAC CATHETERIZATION  03/20/01   Cardiolite EF 55% 02/10/02   CHOLECYSTECTOMY N/A 07/09/2017   Procedure: LAPAROSCOPIC CHOLECYSTECTOMY;  Surgeon: Judeth Horn, MD;  Location: [facility];  Service: General;  Laterality: N/A;   COLONOSCOPY  multiple   ENDOSCOPIC RETROGRADE CHOLANGIOPANCREATOGRAPHY (ERCP) WITH PROPOFOL N/A 07/08/2017   Procedure: ENDOSCOPIC RETROGRADE CHOLANGIOPANCREATOGRAPHY (ERCP) WITH PROPOFOL;  Surgeon: Jackquline Denmark, MD;  Location: [facility] ENDOSCOPY;  Service: Endoscopy;  Laterality: N/A;   INGUINAL HERNIA REPAIR  04/09/06   Bilateral   LAPAROSCOPIC APPENDECTOMY  03/1981   LEFT HEART CATH AND CORONARY ANGIOGRAPHY Left 12/11/2019   Procedure: LEFT HEART CATH AND CORONARY ANGIOGRAPHY;  Surgeon: Nelva Bush, MD;  Location: [place] CV LAB;  Service: Cardiovascular;  Laterality: Left;   REMOVAL OF STONES  07/08/2017   Procedure: REMOVAL OF STONES;  Surgeon: Jackquline Denmark, MD;  Location: [facility] ENDOSCOPY;  Service: Endoscopy;;   SPHINCTEROTOMY  07/08/2017   Procedure: Joan Mayans;  Surgeon: Jackquline Denmark, MD;  Location: [facility] ENDOSCOPY;  Service: Endoscopy;;    Current Meds  Medication Sig   Ascorbic Acid (VITAMIN C) 1000 MG tablet Take 1,000 mg by mouth daily.   aspirin EC 81 MG tablet Take 1 tablet (81 mg total) by mouth daily. Swallow whole.   carvedilol (COREG) 3.125 MG tablet TAKE 1 TABLET(3.125 MG) BY MOUTH TWICE DAILY   cholecalciferol (VITAMIN D3) 25 MCG (1000 UNIT) tablet Take 2,000 Units by mouth daily.    Coenzyme Q10 100 MG capsule Take 100 mg by mouth daily.   ezetimibe (ZETIA) 10 MG tablet Take  1 tablet (10 mg total) by mouth daily.   famotidine (PEPCID) 20 MG tablet Take 1 tablet (20 mg total) by mouth 2 (two) times daily as needed for heartburn or indigestion.   Garlic 510 MG TABS Take 500 mg by mouth daily.   icosapent Ethyl (VASCEPA) 1 g capsule Take 2 capsules (2 g  total) by mouth 2 (two) times daily.   isosorbide mononitrate (IMDUR) 120 MG 24 hr tablet TAKE 1 TABLET(120 MG) BY MOUTH DAILY   losartan (COZAAR) 25 MG tablet Take 1 tablet (25 mg total) by mouth daily.   nitroGLYCERIN (NITROSTAT) 0.4 MG SL tablet Place 1 tablet (0.4 mg total) under the tongue every 5 (five) minutes as needed for chest pain. Maximum of 3 doses.   omeprazole (PRILOSEC) 20 MG capsule Take 20 mg by mouth daily as needed.   tiZANidine (ZANAFLEX) 4 MG tablet Take 0.5-1 tablets (2-4 mg total) by mouth every 8 (eight) hours as needed for muscle spasms (sedation caution).    Allergies: Amoxicillin, Lyrica [pregabalin], Morphine sulfate, Neurontin [gabapentin], and Sulfonamide derivatives  Social History   Tobacco Use   Smoking status: Never   Smokeless tobacco: Never  Vaping Use   Vaping Use: Never used  Substance Use Topics   Alcohol use: No   Drug use: No    Family History  Problem Relation Age of Onset   Cancer Mother        ? GYN, died after hysterectomy   Stroke Father    CAD Father    Hypertension Sister    Prostate cancer Brother    CAD Brother 26       CABG   Bladder Cancer Brother 50       Bladder   Hypertension Brother    Hypertension Brother    Hypertension Brother    Hypertension Brother    Hypertension Brother    Bone cancer Paternal Grandfather    Hypertension Son    Prostate cancer Nephew    Bladder Cancer Nephew    Colon cancer Neg Hx    Esophageal cancer Neg Hx    Rectal cancer Neg Hx    Stomach cancer Neg Hx     Review of Systems: A 12-system review of systems was performed and was negative except as noted in the HPI.  --------------------------------------------------------------------------------------------------  Physical Exam: BP (!) 148/87 (BP Location: Left Arm, Patient Position: Sitting, Cuff Size: Large)   Pulse (!) 54   Ht '5\' 7"'$  (1.702 m)   Wt 178 lb (80.7 kg)   SpO2 98%   BMI 27.88 kg/m   Position Blood pressure  (mmHg) Heart rate (bpm)  Lying 156/85 52  Sitting 139/80 57  Standing 150/76 59  Standing (3 minutes) 161/91 59   General:  NAD. Neck: No JVD or HJR. Lungs: Clear to auscultation bilaterally without wheezes or crackles. Heart: Bradycardic but regular without murmurs, rubs, or gallops. Abdomen: Soft, nontender, nondistended. Extremities: No lower extremity edema.  EKG: Sinus bradycardia with right bundle branch block and nonspecific ST segment changes.  No significant change since 03/29/2021.  Lab Results  Component Value Date   WBC 6.4 09/28/2020   HGB 14.7 09/28/2020   HCT 42.2 09/28/2020   MCV 86 09/28/2020   PLT 124 (L) 09/28/2020    Lab Results  Component Value Date   NA 139 06/28/2021   K 4.1 06/28/2021   CL 109 06/28/2021   CO2 26 06/28/2021   BUN 21 06/28/2021   CREATININE 1.11 06/28/2021  GLUCOSE 118 (H) 06/28/2021   ALT 18 06/28/2021    Lab Results  Component Value Date   CHOL 121 10/06/2021   HDL 27 (L) 10/06/2021   LDLCALC 48 10/06/2021   LDLDIRECT 35.2 06/28/2021   TRIG 230 (H) 10/06/2021   CHOLHDL 4.5 10/06/2021    --------------------------------------------------------------------------------------------------  ASSESSMENT AND PLAN: Dizziness: Billy Kim description of his dizziness is more consistent with vertigo than lightheadedness.  Orthostatic vital signs are not significant and are notable for overall elevated blood pressure readings.  He does not report focal neurologic changes or have any obvious neurologic abnormalities on examination today.  I am hopeful that his symptoms will resolve spontaneously, as I am most suspicious for a vestibular cause.  I have provided a prescription for as needed meclizine.  If his symptoms worsen, I advised Billy Kim to go to the ED immediately.  If they do not resolve over the next week, he should reach out to his PCP for further evaluation.  We will defer reducing carvedilol today despite his sinus bradycardia  (this has been chronic and appears stable) in an effort to prevent further angina.  Hypertension: Blood pressure not well controlled today.  Acute stress from his dizziness may be contributing to this.  Nonetheless, I think it would be prudent to escalate his blood pressure regimen.  We have agreed to increase losartan to 50 mg daily.  I will have Billy Kim return in 2 weeks to reassess his blood pressure and also check a BMP at that time (he is scheduled to travel to the [facility] a few days later).  Coronary artery disease with stable angina: Periodic chest pain appears stable.  Continue antianginal regimen consisting of carvedilol and isosorbide mononitrate, as well as secondary prevention with aspirin and ezetimibe given statin intolerance and adequately controlled LDL.  Mixed hyperlipidemia: Continue ezetimibe given statin intolerance and LDL at goal.  We will also continue with Vascepa for management of hypertriglyceridemia.  Erectile dysfunction: This could be due to a number of factors, including medications and underlying vascular disease.  As he is on chronic nitrate therapy for angina control, PDE5 inhibitors are not an option for treatment.  Given his uncontrolled hypertension and dizziness, we have agreed to defer any further intervention for now.  Once his dizziness and hypertension are better controlled, we could consider holding carvedilol or referring him to urology to discuss further treatment options.  Follow-up: Return to clinic in 2 weeks with BMP at that time.  Nelva Bush, MD 11/02/2021 8:13 AM

## 2021-11-02 ENCOUNTER — Encounter: Payer: Self-pay | Admitting: Internal Medicine

## 2021-11-02 ENCOUNTER — Ambulatory Visit: Payer: PPO | Attending: Internal Medicine | Admitting: Internal Medicine

## 2021-11-02 VITALS — BP 148/87 | HR 54 | Ht 67.0 in | Wt 178.0 lb

## 2021-11-02 DIAGNOSIS — I10 Essential (primary) hypertension: Secondary | ICD-10-CM

## 2021-11-02 DIAGNOSIS — R42 Dizziness and giddiness: Secondary | ICD-10-CM | POA: Diagnosis not present

## 2021-11-02 DIAGNOSIS — I25118 Atherosclerotic heart disease of native coronary artery with other forms of angina pectoris: Secondary | ICD-10-CM | POA: Diagnosis not present

## 2021-11-02 DIAGNOSIS — E782 Mixed hyperlipidemia: Secondary | ICD-10-CM | POA: Diagnosis not present

## 2021-11-02 DIAGNOSIS — N529 Male erectile dysfunction, unspecified: Secondary | ICD-10-CM

## 2021-11-02 DIAGNOSIS — E785 Hyperlipidemia, unspecified: Secondary | ICD-10-CM

## 2021-11-02 MED ORDER — LOSARTAN POTASSIUM 50 MG PO TABS: 50.0000 mg | ORAL_TABLET | Freq: Every day | ORAL | 1 refills | Status: DC

## 2021-11-02 MED ORDER — MECLIZINE HCL 25 MG PO TABS: ORAL_TABLET | ORAL | 0 refills | Status: DC

## 2021-11-02 NOTE — Patient Instructions (Signed)
Medication Instructions:  - Your physician has recommended you make the following change in your medication:   1) INCREASE Losartan to 50 mg: - take 1 tablet by mouth once daily   2) START Meclizine 25 mg: - take 0.5- 1 tablet by mouth every 6 hours as needed for dizziness  *If you need a refill on your cardiac medications before your next appointment, please call your pharmacy*   Lab Work: - none ordered  If you have labs (blood work) drawn today and your tests are completely normal, you will receive your results only by: [place] (if you have MyChart) OR A paper copy in the mail If you have any lab test that is abnormal or we need to change your treatment, we will call you to review the results.   Testing/Procedures: - none ordered   Follow-Up: At [facility], you and your health needs are our priority.  As part of our continuing mission to provide you with exceptional heart care, we have created designated Provider Care Teams.  These Care Teams include your primary Cardiologist (physician) and Advanced Practice Providers (APPs -  Physician Assistants and Nurse Practitioners) who all work together to provide you with the care you need, when you need it.  We recommend signing up for the patient portal called "MyChart".  Sign up information is provided on this After Visit Summary.  MyChart is used to connect with patients for Virtual Visits (Telemedicine).  Patients are able to view lab/test results, encounter notes, upcoming appointments, etc.  Non-urgent messages can be sent to your provider as well.   To learn more about what you can do with MyChart, go to [email or web address].    Your next appointment:   Wednesday 11/15/21 @ 2:45 pm  The format for your next appointment:   In Person  Provider:   Christell Faith, PA-C    Other Instructions  Meclizine Tablets What is this medication? MECLIZINE (MEK li zeen) prevents and treats nausea, vomiting, and dizziness  caused by motion sickness. It works by Building control surveyor maintain its sense of balance. It belongs to a group of medications called antihistamines. This medicine may be used for other purposes; ask your health care provider or pharmacist if you have questions. COMMON BRAND NAME(S): Antivert, Dramamine Less Drowsy, Dramamine-N, Medivert, Meni-D What should I tell my care team before I take this medication? They need to know if you have any of these conditions: Glaucoma Lung or breathing disease, such as asthma Problems urinating Prostate disease Stomach or intestine problems An unusual or allergic reaction to meclizine, other medications, foods, dyes, or preservatives Pregnant or trying to get pregnant Breast-feeding How should I use this medication? Take this medication by mouth with water. Take it as directed on the prescription label at the same time every day. If you are using this medication to prevent motion sickness, take the dose at least 1 hour before travel. You can take it with or without food. If it upsets your stomach, take it with food or milk. Talk to your care team about the use of this medication in children. Special care may be needed. Overdosage: If you think you have taken too much of this medicine contact a poison control center or emergency room at once. NOTE: This medicine is only for you. Do not share this medicine with others. What if I miss a dose? If you miss a dose, take it as soon as you can. If it is almost time for your  next dose, take only that dose. Do not take double or extra doses. What may interact with this medication? Do not take this medication with any of the following: MAOIs, such as Carbex, Eldepryl, Marplan, Nardil, and Parnate This medication may also interact with the following: Alcohol Antihistamines for allergy, cough and cold Certain medications for anxiety or sleep Certain medications for depression, such as amitriptyline, fluoxetine,  sertraline Certain medications for seizures, such as phenobarbital, primidone General anesthetics, such as halothane, isoflurane, methoxyflurane, propofol Local anesthetics, such as lidocaine, pramoxine, tetracaine Medications that relax muscles for surgery Opioid medications for pain Phenothiazines, such as chlorpromazine, mesoridazine, prochlorperazine, thioridazine This list may not describe all possible interactions. Give your health care provider a list of all the medicines, herbs, non-prescription drugs, or dietary supplements you use. Also tell them if you smoke, drink alcohol, or use illegal drugs. Some items may interact with your medicine. What should I watch for while using this medication? Visit your care team for regular checks on your progress. Tell your care team if your symptoms do not start to get better or if they get worse. This medication may affect your coordination, reaction time, or judgment. Do not drive or operate machinery until you know how this medication affects you. Sit up or stand slowly to reduce the risk of dizzy or fainting spells. Drinking alcohol with this medication can increase the risk of these side effects. Your mouth may get dry. Chewing sugarless gum or sucking hard candy, and drinking plenty of water may help. Contact your care team if the problem does not go away or is severe. This medication may cause dry eyes and blurred vision. If you wear contact lenses, you may feel some discomfort. Lubricating drops may help. See your care team if the problem does not go away or is severe. What side effects may I notice from receiving this medication? Side effects that you should report to your care team as soon as possible: Allergic reactions--skin rash, itching, hives, swelling of the face, lips, tongue, or throat Heart rhythm changes--fast or irregular heartbeat, dizziness, feeling faint or lightheaded, chest pain, trouble breathing Sudden eye pain or change in  vision such as blurry vision, seeing halos around lights, vision loss Trouble passing urine Side effects that usually do not require medical attention (report to your care team if they continue or are bothersome): Confusion Constipation Dizziness Drowsiness Dry mouth This list may not describe all possible side effects. Call your doctor for medical advice about side effects. You may report side effects to FDA at 1-800-FDA-1088. Where should I keep my medication? Keep out of the reach of children and pets. Store at room temperature between 15 and 30 degrees C (59 and 86 degrees F). Keep container tightly closed. Get rid of any unused medication after the expiration date. NOTE: This sheet is a summary. It may not cover all possible information. If you have questions about this medicine, talk to your doctor, pharmacist, or health care provider.  2023 Elsevier/Gold Standard (2021-02-01 00:00:00)   Important Information About Sugar

## 2021-11-03 ENCOUNTER — Encounter: Payer: Self-pay | Admitting: Internal Medicine

## 2021-11-03 DIAGNOSIS — R42 Dizziness and giddiness: Secondary | ICD-10-CM | POA: Insufficient documentation

## 2021-11-07 ENCOUNTER — Telehealth: Payer: Self-pay | Admitting: Family Medicine

## 2021-11-07 NOTE — Telephone Encounter (Signed)
Wife called in to ask could her husband come get get his labs completed in the morning 11/08/21? He usually goes to [facility] and get them completed. She would like yo know if his lab orders has been placed for here or for [facility]? Could you please call wife to verify then send back to me to schedule if he's able to come here.

## 2021-11-08 ENCOUNTER — Other Ambulatory Visit: Payer: Self-pay | Admitting: Family Medicine

## 2021-11-08 ENCOUNTER — Other Ambulatory Visit (INDEPENDENT_AMBULATORY_CARE_PROVIDER_SITE_OTHER): Payer: PPO

## 2021-11-08 DIAGNOSIS — Z125 Encounter for screening for malignant neoplasm of prostate: Secondary | ICD-10-CM

## 2021-11-08 DIAGNOSIS — E785 Hyperlipidemia, unspecified: Secondary | ICD-10-CM

## 2021-11-08 DIAGNOSIS — I10 Essential (primary) hypertension: Secondary | ICD-10-CM

## 2021-11-08 LAB — CBC WITH DIFFERENTIAL/PLATELET
Basophils Absolute: 0.1 10*3/uL (ref 0.0–0.1)
Basophils Relative: 1.2 % (ref 0.0–3.0)
Eosinophils Absolute: 0.3 10*3/uL (ref 0.0–0.7)
Eosinophils Relative: 4.4 % (ref 0.0–5.0)
HCT: 42 % (ref 39.0–52.0)
Hemoglobin: 15.1 g/dL (ref 13.0–17.0)
Lymphocytes Relative: 19.9 % (ref 12.0–46.0)
Lymphs Abs: 1.3 10*3/uL (ref 0.7–4.0)
MCHC: 36 g/dL (ref 30.0–36.0)
MCV: 86.1 fl (ref 78.0–100.0)
Monocytes Absolute: 0.7 10*3/uL (ref 0.1–1.0)
Monocytes Relative: 10.6 % (ref 3.0–12.0)
Neutro Abs: 4 10*3/uL (ref 1.4–7.7)
Neutrophils Relative %: 63.9 % (ref 43.0–77.0)
Platelets: 114 10*3/uL — ABNORMAL LOW (ref 150.0–400.0)
RBC: 4.88 Mil/uL (ref 4.22–5.81)
RDW: 13.7 % (ref 11.5–15.5)
WBC: 6.3 10*3/uL (ref 4.0–10.5)

## 2021-11-08 LAB — COMPREHENSIVE METABOLIC PANEL
ALT: 13 U/L (ref 0–53)
AST: 18 U/L (ref 0–37)
Albumin: 3.6 g/dL (ref 3.5–5.2)
Alkaline Phosphatase: 66 U/L (ref 39–117)
BUN: 19 mg/dL (ref 6–23)
CO2: 27 mEq/L (ref 19–32)
Calcium: 8.7 mg/dL (ref 8.4–10.5)
Chloride: 106 mEq/L (ref 96–112)
Creatinine, Ser: 1.19 mg/dL (ref 0.40–1.50)
GFR: 58.91 mL/min — ABNORMAL LOW (ref >60.00)
Glucose, Bld: 100 mg/dL — ABNORMAL HIGH (ref 70–99)
Potassium: 3.9 mEq/L (ref 3.5–5.1)
Sodium: 141 mEq/L (ref 135–145)
Total Bilirubin: 1.5 mg/dL — ABNORMAL HIGH (ref 0.2–1.2)
Total Protein: 5.8 g/dL — ABNORMAL LOW (ref 6.0–8.3)

## 2021-11-08 LAB — LIPID PANEL
Cholesterol: 130 mg/dL (ref 0–200)
HDL: 25.1 mg/dL — ABNORMAL LOW (ref >39.00)
Total CHOL/HDL Ratio: 5
Triglycerides: 432 mg/dL — ABNORMAL HIGH (ref 0.0–149.0)

## 2021-11-08 LAB — PSA, MEDICARE: PSA: 3.48 ng/ml (ref 0.10–4.00)

## 2021-11-08 LAB — LDL CHOLESTEROL, DIRECT: Direct LDL: 62 mg/dL

## 2021-11-10 ENCOUNTER — Ambulatory Visit (INDEPENDENT_AMBULATORY_CARE_PROVIDER_SITE_OTHER): Payer: PPO | Admitting: Family Medicine

## 2021-11-10 ENCOUNTER — Encounter: Payer: Self-pay | Admitting: Family Medicine

## 2021-11-10 VITALS — BP 138/68 | HR 62 | Temp 97.6°F | Ht 67.0 in | Wt 183.0 lb

## 2021-11-10 DIAGNOSIS — Z23 Encounter for immunization: Secondary | ICD-10-CM

## 2021-11-10 DIAGNOSIS — E785 Hyperlipidemia, unspecified: Secondary | ICD-10-CM

## 2021-11-10 DIAGNOSIS — R42 Dizziness and giddiness: Secondary | ICD-10-CM | POA: Diagnosis not present

## 2021-11-10 DIAGNOSIS — I10 Essential (primary) hypertension: Secondary | ICD-10-CM | POA: Diagnosis not present

## 2021-11-10 DIAGNOSIS — Z0000 Encounter for general adult medical examination without abnormal findings: Secondary | ICD-10-CM

## 2021-11-10 DIAGNOSIS — D696 Thrombocytopenia, unspecified: Secondary | ICD-10-CM | POA: Diagnosis not present

## 2021-11-10 DIAGNOSIS — Z7189 Other specified counseling: Secondary | ICD-10-CM

## 2021-11-10 NOTE — Progress Notes (Unsigned)
Cardiology Office Note    Date:  11/15/2021   ID:  Billy Kim, DOB 1945/12/02, MRN [id number]  PCP:  Tonia Ghent, MD  Cardiologist:  Nelva Bush, MD  Electrophysiologist:  None   Chief Complaint: Follow up  History of Present Illness:   Billy Kim is a 76 y.o. male with history of CAD medically managed as outlined below, HTN, and ulcerative colitis who presents for follow-up of hypertension.  Lexiscan MPI in 09/2019 showed no evidence of ischemia with an EF of 58% and was overall low risk.  Echo in 09/2019 demonstrated an EF of 60 to 65%, no regional wall motion abnormalities, grade 2 diastolic dysfunction, normal RV systolic function, ventricular cavity size, and PASP, trivial mitral regurgitation, and an estimated right atrial pressure of 3 mmHg.  LHC in 11/2019 showed significant two-vessel CAD with sequential 60% and 40% proximal/mid LAD stenoses that were not hemodynamically significant with an iFR of 0.92 and FFR of 0.84, as well as chronic total occlusions of a medium caliber D1 and small nondominant RCA.  There was mild to moderate plaquing noted in OM/PDA branches.  Normal LV systolic function with mildly elevated filling pressures noted.  Medical therapy was recommended with preservation for CTO PCI of the D1 for refractory symptoms despite maximally tolerated antianginal therapy.  He was last seen in the office on 11/02/2021 noting onset of dizziness the day prior, at which time he felt like things were spinning around him.  BP at home was 158/106 followed by 145/94.  He also reported a single episode of nonexertional chest pain 2 weeks prior that improved with 2 SL NTG.  BP in the office 148/87 with a heart rate of 54 bpm.  EKG showed sinus bradycardia with a right bundle branch block and nonspecific ST-T changes.  This was not significantly changed when compared to prior tracing.  Orthostatic vital signs were not significant and notable for overall elevated BP readings.  His  description of dizziness was more consistent with vertigo.  He was prescribed a trial of meclizine with recommendation for the patient to reach out to his PCP if his symptoms persisted.  Reduction of carvedilol was deferred given chronically elevated blood pressures and chronic sinus bradycardia that appeared stable.  Losartan was titrated to 50 mg daily given elevated BP readings.  He comes in today for follow-up of hypertension.  He comes in doing well from a cardiac perspective and is without symptoms of angina or decompensation.  He has not had any further dizziness or episodes of chest pain.  No dyspnea or palpitations.  He did find out his blood pressure cuff was an accurate when comparing it to his PCP's.  He has tolerated titration of losartan without issues.  No significant lower extremity swelling.  He remains active, continuing to work as an Scientist, water quality.   Labs independently reviewed: 10/2021 - direct LDL 62, TC 130, TG 432, HDL 25, potassium 3.9, BUN 19, serum creatinine 1.19, albumin 3.6, AST/ALT normal, Hgb 15.1, PLT 114  Past Medical History:  Diagnosis Date   Coronary artery disease 01/2020   Moderate proximal LAD disease (not hemodynamically significant) and CTO's of D1 and non-dominant RCA   HLD (hyperlipidemia)    Hypertension    Ulcerative colitis 5/81   Remission for years    Past Surgical History:  Procedure Laterality Date   CARDIAC CATHETERIZATION  03/20/01   Cardiolite EF 55% 02/10/02   CHOLECYSTECTOMY N/A 07/09/2017   Procedure: LAPAROSCOPIC  CHOLECYSTECTOMY;  Surgeon: Judeth Horn, MD;  Location: [facility];  Service: General;  Laterality: N/A;   COLONOSCOPY  multiple   ENDOSCOPIC RETROGRADE CHOLANGIOPANCREATOGRAPHY (ERCP) WITH PROPOFOL N/A 07/08/2017   Procedure: ENDOSCOPIC RETROGRADE CHOLANGIOPANCREATOGRAPHY (ERCP) WITH PROPOFOL;  Surgeon: Jackquline Denmark, MD;  Location: [facility] ENDOSCOPY;  Service: Endoscopy;  Laterality: N/A;   INGUINAL HERNIA REPAIR  04/09/06   Bilateral    LAPAROSCOPIC APPENDECTOMY  03/1981   LEFT HEART CATH AND CORONARY ANGIOGRAPHY Left 12/11/2019   Procedure: LEFT HEART CATH AND CORONARY ANGIOGRAPHY;  Surgeon: Nelva Bush, MD;  Location: [facility];  Service: Cardiovascular;  Laterality: Left;   REMOVAL OF STONES  07/08/2017   Procedure: REMOVAL OF STONES;  Surgeon: Jackquline Denmark, MD;  Location: [facility] ENDOSCOPY;  Service: Endoscopy;;   SPHINCTEROTOMY  07/08/2017   Procedure: Joan Mayans;  Surgeon: Jackquline Denmark, MD;  Location: [facility] ENDOSCOPY;  Service: Endoscopy;;    Current Medications: Current Meds  Medication Sig   Ascorbic Acid (VITAMIN C) 1000 MG tablet Take 1,000 mg by mouth daily.   aspirin EC 81 MG tablet Take 1 tablet (81 mg total) by mouth daily. Swallow whole.   carvedilol (COREG) 3.125 MG tablet TAKE 1 TABLET(3.125 MG) BY MOUTH TWICE DAILY   cholecalciferol (VITAMIN D3) 25 MCG (1000 UNIT) tablet Take 2,000 Units by mouth daily.    Coenzyme Q10 100 MG capsule Take 100 mg by mouth daily.   ezetimibe (ZETIA) 10 MG tablet Take 1 tablet (10 mg total) by mouth daily.   famotidine (PEPCID) 20 MG tablet Take 1 tablet (20 mg total) by mouth 2 (two) times daily as needed for heartburn or indigestion.   Garlic 154 MG TABS Take 500 mg by mouth daily.   icosapent Ethyl (VASCEPA) 1 g capsule Take 2 capsules (2 g total) by mouth 2 (two) times daily.   isosorbide mononitrate (IMDUR) 120 MG 24 hr tablet TAKE 1 TABLET(120 MG) BY MOUTH DAILY   meclizine (ANTIVERT) 25 MG tablet Take 0.5-1 tablet (12.5 mg- 25 mg) by mouth every 6 hours as needed for dizziness   nitroGLYCERIN (NITROSTAT) 0.4 MG SL tablet Place 1 tablet (0.4 mg total) under the tongue every 5 (five) minutes as needed for chest pain. Maximum of 3 doses.   omeprazole (PRILOSEC) 20 MG capsule Take 20 mg by mouth daily as needed.   tiZANidine (ZANAFLEX) 4 MG tablet Take 0.5-1 tablets (2-4 mg total) by mouth every 8 (eight) hours as needed for muscle spasms (sedation caution).    [DISCONTINUED] losartan (COZAAR) 50 MG tablet Take 1 tablet (50 mg total) by mouth daily.    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   Social Determinants  of Health   Financial Resource Strain: Low Risk  (08/04/2020)   Overall Financial Resource Strain (CARDIA)    Difficulty of Paying Living Expenses: Not hard at all  Food Insecurity: No Food Insecurity (08/04/2020)   Hunger Vital Sign    Worried About Running Out of Food in the Last Year: Never true    Ran Out of Food in the Last Year: Never true  Transportation Needs: No Transportation Needs (08/04/2020)   PRAPARE - Hydrologist (Medical): No    Lack of Transportation (Non-Medical): No  Physical Activity: Inactive (08/04/2020)   Exercise Vital Sign    Days of Exercise per Week: 0 days    Minutes of Exercise per Session: 0 min  Stress: No Stress Concern Present (08/04/2020)   [place]    Feeling of Stress : Not at all  Social Connections: Not on file     Family History:  The patient's family history includes Bladder Cancer in his nephew; Bladder Cancer (age of onset: 73) in his brother; Bone cancer in his paternal  grandfather; CAD in his father; CAD (age of onset: 48) in his brother; Cancer in his mother; Hypertension in his brother, brother, brother, brother, brother, sister, and son; Prostate cancer in his brother and nephew; Stroke in his father. There is no history of Colon cancer, Esophageal cancer, Rectal cancer, or Stomach cancer.  ROS:   12-point review of systems is negative unless otherwise noted in the HPI.   EKGs/Labs/Other Studies Reviewed:    Studies reviewed were summarized above. The additional studies were reviewed today:  LHC 12/11/2019: Conclusions: Significant two-vessel coronary artery disease with sequential 60% and 40% proximal/mid LAD stenoses that are not hemodynamically significant (iFR 0.92, FFR 0.84), as well as chronic total occlusions of medium-caliber D1 and small, non-dominant RCA.  Mild to moderate plaquing noted in OM/lPDA branches. Normal left ventricular systolic function with mildly elevated filling pressure.   Recommendations: Escalate antianginal therapy; will have patient start taking isosorbide mononitrate 15 mg daily, as prescribed earlier this week.  If he has refractory symptoms despite maximal tolerated doses of at least two antianginal medications, PCI to CTO of D1 could be considered. Aggressive secondary prevention; will add rosuvastatin 5 mg daily. __________  2D echo 10/02/2019: 1. Left ventricular ejection fraction, by estimation, is 60 to 65%. The  left ventricle has normal function. The left ventricle has no regional  wall motion abnormalities. Left ventricular diastolic parameters are  consistent with Grade II diastolic  dysfunction (pseudonormalization). Elevated left atrial pressure.   2. Right ventricular systolic function is normal. The right ventricular  size is normal. There is normal pulmonary artery systolic pressure. The  estimated right ventricular systolic pressure is 76.1 mmHg.   3. The mitral valve is normal in structure. Trivial  mitral valve  regurgitation. No evidence of mitral stenosis.   4. The aortic valve is normal in structure. Aortic valve regurgitation is  not visualized. No aortic stenosis is present.   5. The inferior vena cava is normal in size with greater than 50%  respiratory variability, suggesting right atrial pressure of 3 mmHg. __________  Carlton Adam MPI 10/02/2019: IMPRESSION: 1. No evidence of inducible myocardial ischemia. 2. Normal left ventricular wall motion. 3. Left ventricular ejection fraction 58% 4. Non invasive risk stratification*: Low    EKG:  EKG is not ordered today.   Recent Labs: 11/08/2021: ALT 13; BUN 19; Creatinine, Ser 1.19; Hemoglobin  15.1; Platelets 114.0; Potassium 3.9; Sodium 141  Recent Lipid Panel    Component Value Date/Time   CHOL 130 11/08/2021 0737   CHOL 114 09/28/2020 0742   CHOL 144 04/29/2019 0000   TRIG (H) 11/08/2021 0737    432.0 Triglyceride is over 400; calculations on Lipids are invalid.   TRIG 207 (A) 04/29/2019 0000   HDL 25.10 (L) 11/08/2021 0737   HDL 28 (L) 09/28/2020 0742   CHOLHDL 5 11/08/2021 0737   VLDL 46 (H) 10/06/2021 0930   LDLCALC 48 10/06/2021 0930   LDLCALC 42 09/28/2020 0742   LDLCALC 76 04/29/2019 0000   LDLDIRECT 62.0 11/08/2021 0737    PHYSICAL EXAM:    VS:  BP (!) 140/92 (BP Location: Left Arm, Patient Position: Sitting, Cuff Size: Normal)   Pulse 72   Ht '5\' 7"'$  (1.702 m)   Wt 183 lb 9.6 oz (83.3 kg)   SpO2 97%   BMI 28.76 kg/m   BMI: Body mass index is 28.76 kg/m.  Physical Exam Vitals reviewed.  Constitutional:      Appearance: He is well-developed.  HENT:     Head: Normocephalic and atraumatic.  Eyes:     General:        Right eye: No discharge.        Left eye: No discharge.  Neck:     Vascular: No JVD.  Cardiovascular:     Rate and Rhythm: Normal rate and regular rhythm.     Pulses:          Posterior tibial pulses are 2+ on the right side and 2+ on the left side.     Heart sounds: Normal heart  sounds, S1 normal and S2 normal. Heart sounds not distant. No midsystolic click and no opening snap. No murmur heard.    No friction rub.  Pulmonary:     Effort: Pulmonary effort is normal. No respiratory distress.     Breath sounds: Normal breath sounds. No decreased breath sounds, wheezing or rales.  Chest:     Chest wall: No tenderness.  Abdominal:     General: There is no distension.  Musculoskeletal:     Cervical back: Normal range of motion.     Right lower leg: No edema.     Left lower leg: No edema.  Skin:    General: Skin is warm and dry.     Nails: There is no clubbing.  Neurological:     Mental Status: He is alert and oriented to person, place, and time.  Psychiatric:        Speech: Speech normal.        Behavior: Behavior normal.        Thought Content: Thought content normal.        Judgment: Judgment normal.     Wt Readings from Last 3 Encounters:  11/15/21 183 lb 9.6 oz (83.3 kg)  11/10/21 183 lb (83 kg)  11/02/21 178 lb (80.7 kg)     ASSESSMENT & PLAN:   Dizziness: Resolved.    CAD involving the native coronary arteries with stable angina: He is without symptoms of angina or decompensation.  No further chest pain.  Continue aggressive risk factor modification and secondary prevention including aspirin, ezetimibe given statin intolerance and adequately controlled LDL along with carvedilol, isosorbide mononitrate, losartan, and Vascepa.  No indication for further ischemic testing at this time.  HTN: Blood pressure remains mildly elevated in the office today, though was well controlled at his PCP's visit  recently.  Titrate losartan to 75 mg daily.  Otherwise continue current dose carvedilol with prior bradycardia precluding titration along with isosorbide mononitrate.  Can continued low-sodium diet is encouraged.  Check BMP.  HLD/hypertriglyceridemia: LDL 62 in 10/2021 with normal AST/ALT at that time.  He remains on ezetimibe and Vascepa.  Statin  intolerant.    Disposition: F/u with Dr. Saunders Revel or an APP in 1 to 2 months.   Medication Adjustments/Labs and Tests Ordered: Current medicines are reviewed at length with the patient today.  Concerns regarding medicines are outlined above. Medication changes, Labs and Tests ordered today are summarized above and listed in the Patient Instructions accessible in Encounters.   Signed, Christell Faith, PA-C 11/15/2021 3:28 PM     [facility] [address] [place] [address] [place],  [address] [phone]

## 2021-11-10 NOTE — Patient Instructions (Signed)
Take care.  Glad to see you. Update me as needed.   Flu shot today.

## 2021-11-10 NOTE — Progress Notes (Unsigned)
I have personally reviewed the Medicare Annual Wellness questionnaire and have noted 1. The patient's medical and social history 2. Their use of alcohol, tobacco or illicit drugs 3. Their current medications and supplements 4. The patient's functional ability including ADL's, fall risks, home safety risks and hearing or visual             impairment. 5. Diet and physical activities 6. Evidence for depression or mood disorders  The patients weight, height, BMI have been recorded in the chart and visual acuity is per eye clinic.  I have made referrals, counseling and provided education to the patient based review of the above and I have provided the pt with a written personalized care plan for preventive services.  Provider list updated- see scanned forms.  Routine anticipatory guidance given to patient.  See health maintenance. The possibility exists that previously documented standard health maintenance information may have been brought forward from a previous encounter into this note.  If needed, that same information has been updated to reflect the current situation based on today's encounter.    Flu Shingles PNA Tetanus Colon  Breast cancer screening Prostate cancer screening Advance directive Cognitive function addressed- see scanned forms- and if abnormal then additional documentation follows.   In addition to [facility] Wellness, follow up visit for the below conditions:  Hypertension:    Using medication without problems or lightheadedness:  yes Chest pain with exertion: no Edema: occ B ankle edema.   Short of breath: no BP controlled today.  1 episode of NTG use a few weeks ago.  It helped with chest discomfort at the time.  He was at rest at the, not heavy exertion.  No sx or NTG use in the meantime.   H/o thrombocytopenia but no bleeding and recent labs are stable.    Elevated Cholesterol: Using medications without problems: yes Muscle aches: no Diet compliance: d/w pt.    Exercise: d/w pt.   Labs d/w pt.    Dizziness d/w pt.  Is better in the meantime.  He hasn't had to use meclizine.   PMH and SH reviewed  Meds, vitals, and allergies reviewed.   ROS: Per HPI.  Unless specifically indicated otherwise in HPI, the patient denies:  General: fever. Eyes: acute vision changes ENT: sore throat Cardiovascular: chest pain Respiratory: SOB GI: vomiting GU: dysuria Musculoskeletal: acute back pain Derm: acute rash Neuro: acute motor dysfunction Psych: worsening mood Endocrine: polydipsia Heme: bleeding Allergy: hayfever  GEN: nad, alert and oriented HEENT: mucous membranes moist NECK: supple w/o LA CV: rrr. PULM: ctab, no inc wob ABD: soft, +bs EXT: no edema SKIN: no acute rash

## 2021-11-13 NOTE — Assessment & Plan Note (Signed)
Continue carvedilol isosorbide losartan with as needed nitroglycerin and update me as needed.

## 2021-11-13 NOTE — Assessment & Plan Note (Signed)
Advance directive- wife designated if patient were incapacitated.  

## 2021-11-13 NOTE — Assessment & Plan Note (Signed)
Continue Zetia and Vascepa.

## 2021-11-13 NOTE — Assessment & Plan Note (Signed)
H/o thrombocytopenia but no bleeding and recent labs are stable.

## 2021-11-13 NOTE — Assessment & Plan Note (Signed)
Flu 2023 Shingles discussed with patient PNA discussed with patient Tetanus 2008, discussed with patient. COVID-vaccine discussed with patient. Colonoscopy 2015. Prostate cancer screening 2023 Advance directive-wife designated if patient were incapacitated. Cognitive function addressed- see scanned forms- and if abnormal then additional documentation follows.

## 2021-11-13 NOTE — Assessment & Plan Note (Signed)
Discussed that this could have been BPV and anatomy/pathophysiology discussed with patient.  He will update me as needed.

## 2021-11-15 ENCOUNTER — Ambulatory Visit: Payer: PPO | Attending: Physician Assistant | Admitting: Physician Assistant

## 2021-11-15 ENCOUNTER — Encounter: Payer: Self-pay | Admitting: Physician Assistant

## 2021-11-15 ENCOUNTER — Other Ambulatory Visit
Admission: RE | Admit: 2021-11-15 | Discharge: 2021-11-15 | Disposition: A | Discharge status: Home / Self Care | Payer: PPO | Source: Ambulatory Visit | Attending: Physician Assistant | Admitting: Physician Assistant

## 2021-11-15 VITALS — BP 140/92 | HR 72 | Ht 67.0 in | Wt 183.6 lb

## 2021-11-15 DIAGNOSIS — E785 Hyperlipidemia, unspecified: Secondary | ICD-10-CM

## 2021-11-15 DIAGNOSIS — Z79899 Other long term (current) drug therapy: Secondary | ICD-10-CM

## 2021-11-15 DIAGNOSIS — I25118 Atherosclerotic heart disease of native coronary artery with other forms of angina pectoris: Secondary | ICD-10-CM | POA: Diagnosis not present

## 2021-11-15 DIAGNOSIS — R42 Dizziness and giddiness: Secondary | ICD-10-CM | POA: Insufficient documentation

## 2021-11-15 DIAGNOSIS — E781 Pure hyperglyceridemia: Secondary | ICD-10-CM | POA: Diagnosis not present

## 2021-11-15 DIAGNOSIS — I10 Essential (primary) hypertension: Secondary | ICD-10-CM

## 2021-11-15 LAB — BASIC METABOLIC PANEL
Anion gap: 7 (ref 5–15)
BUN: 17 mg/dL (ref 8–23)
CO2: 26 mmol/L (ref 22–32)
Calcium: 8.7 mg/dL — ABNORMAL LOW (ref 8.9–10.3)
Chloride: 107 mmol/L (ref 98–111)
Creatinine, Ser: 1.19 mg/dL (ref 0.61–1.24)
GFR, Estimated: >60 mL/min (ref >60)
Glucose, Bld: 90 mg/dL (ref 70–99)
Potassium: 4.2 mmol/L (ref 3.5–5.1)
Sodium: 140 mmol/L (ref 135–145)

## 2021-11-15 MED ORDER — LOSARTAN POTASSIUM 50 MG PO TABS: 75.0000 mg | ORAL_TABLET | Freq: Every day | ORAL | 6 refills | Status: DC

## 2021-11-15 NOTE — Patient Instructions (Signed)
Medication Instructions:  Your physician has recommended you make the following change in your medication:   Increase Losartan to 75 MG once a day  *If you need a refill on your cardiac medications before your next appointment, please call your pharmacy*   Lab Work: BMET over at the [facility] and check in at registration.   If you have labs (blood work) drawn today and your tests are completely normal, you will receive your results only by: [place] (if you have MyChart) OR A paper copy in the mail If you have any lab test that is abnormal or we need to change your treatment, we will call you to review the results.   Testing/Procedures: None   Follow-Up: At [facility], you and your health needs are our priority.  As part of our continuing mission to provide you with exceptional heart care, we have created designated Provider Care Teams.  These Care Teams include your primary Cardiologist (physician) and Advanced Practice Providers (APPs -  Physician Assistants and Nurse Practitioners) who all work together to provide you with the care you need, when you need it.   Your next appointment:   2 month(s)  The format for your next appointment:   In Person  Provider:   Nelva Bush, MD or Christell Faith, PA-C      Important Information About Sugar

## 2021-11-16 ENCOUNTER — Telehealth: Payer: Self-pay | Admitting: Internal Medicine

## 2021-11-16 MED ORDER — LOSARTAN POTASSIUM 50 MG PO TABS: 75.0000 mg | ORAL_TABLET | Freq: Every day | ORAL | 6 refills | Status: DC

## 2021-11-16 NOTE — Telephone Encounter (Signed)
Refill resent to pharmacy.

## 2021-11-16 NOTE — Telephone Encounter (Signed)
Pt c/o medication issue:  1. Name of Medication: losartan (COZAAR) 50 MG tablet  2. How are you currently taking this medication (dosage and times per day)?   3. Are you having a reaction (difficulty breathing--STAT)?   4. What is your medication issue? Pt states that pharmacy said they never received this medication and would like for it to be resent to:     [place], [place] is going out of town soon and needs this as soon as possible.

## 2021-12-02 ENCOUNTER — Other Ambulatory Visit: Payer: Self-pay | Admitting: Internal Medicine

## 2021-12-09 ENCOUNTER — Other Ambulatory Visit: Payer: Self-pay | Admitting: Family Medicine

## 2021-12-15 DIAGNOSIS — H903 Sensorineural hearing loss, bilateral: Secondary | ICD-10-CM | POA: Diagnosis not present

## 2021-12-15 DIAGNOSIS — H6123 Impacted cerumen, bilateral: Secondary | ICD-10-CM | POA: Diagnosis not present

## 2021-12-21 DIAGNOSIS — Z8042 Family history of malignant neoplasm of prostate: Secondary | ICD-10-CM | POA: Diagnosis not present

## 2021-12-21 DIAGNOSIS — R972 Elevated prostate specific antigen [PSA]: Secondary | ICD-10-CM | POA: Diagnosis not present

## 2022-01-17 DIAGNOSIS — D3131 Benign neoplasm of right choroid: Secondary | ICD-10-CM | POA: Diagnosis not present

## 2022-01-18 ENCOUNTER — Ambulatory Visit: Payer: PPO | Attending: Internal Medicine | Admitting: Internal Medicine

## 2022-01-18 ENCOUNTER — Encounter: Payer: Self-pay | Admitting: Internal Medicine

## 2022-01-18 VITALS — BP 136/84 | HR 71 | Ht 67.0 in | Wt 186.0 lb

## 2022-01-18 DIAGNOSIS — I10 Essential (primary) hypertension: Secondary | ICD-10-CM

## 2022-01-18 DIAGNOSIS — I25118 Atherosclerotic heart disease of native coronary artery with other forms of angina pectoris: Secondary | ICD-10-CM

## 2022-01-18 DIAGNOSIS — E782 Mixed hyperlipidemia: Secondary | ICD-10-CM

## 2022-01-18 MED ORDER — LOSARTAN POTASSIUM 50 MG PO TABS: 50.0000 mg | ORAL_TABLET | Freq: Every day | ORAL | 3 refills | Status: DC

## 2022-01-18 NOTE — Progress Notes (Signed)
Follow-up Outpatient Visit Date: 01/18/2022  Primary Care Provider: Tonia Ghent, MD [place] [address]  Chief Complaint: Coronary artery disease and hypertension  HPI:  Billy Kim is a 76 y.o. male with history of coronary artery disease being managed medically (chronic total occlusions of D1 and nondominant RCA as well as moderate proximal LAD disease that was not significant by iFR/FFR), hypertension, and ulcerative colitis, who presents for follow-up of coronary artery disease and hypertension.  I last saw him in mid September, at which time he was concerned about sudden onset of dizziness accompanied by mildly elevated blood pressures.  Orthostatic vital signs in our office were normal.  Due to elevated blood pressure readings, we actually agreed to increase losartan to 50 mg daily, as it was felt that his dizziness was more consistent with vertigo.  He was seen in follow-up 2 weeks later by Christell Faith, PA, at which time he was feeling well without further dizziness or chest pain.  Blood pressure had improved but was still mildly elevated.  Losartan was therefore increased to 75 mg daily.  However, he was confused about the strength of his pill at home and has remained on 50 mg daily since then.  Today, [name] reports that he has been feeling fairly well.  His only complaint is of intermittent burning in his feet, especially at night.  This has been a longstanding issue.  He also notes that his urine was tea colored yesterday, after having not had anything to drink all day.  He has been drinking more water and reports that his urine color is back to normal.  He otherwise feels well, denying chest pain, shortness of breath, palpitations, and edema.  He has not had any further dizziness/lightheadedness since his prior visit with [place].  --------------------------------------------------------------------------------------------------  Past Medical History:  Diagnosis  Date   Coronary artery disease 01/2020   Moderate proximal LAD disease (not hemodynamically significant) and CTO's of D1 and non-dominant RCA   HLD (hyperlipidemia)    Hypertension    Ulcerative colitis 5/81   Remission for years   Past Surgical History:  Procedure Laterality Date   CARDIAC CATHETERIZATION  03/20/01   Cardiolite EF 55% 02/10/02   CHOLECYSTECTOMY N/A 07/09/2017   Procedure: LAPAROSCOPIC CHOLECYSTECTOMY;  Surgeon: Judeth Horn, MD;  Location: [place];  Service: General;  Laterality: N/A;   COLONOSCOPY  multiple   ENDOSCOPIC RETROGRADE CHOLANGIOPANCREATOGRAPHY (ERCP) WITH PROPOFOL N/A 07/08/2017   Procedure: ENDOSCOPIC RETROGRADE CHOLANGIOPANCREATOGRAPHY (ERCP) WITH PROPOFOL;  Surgeon: Jackquline Denmark, MD;  Location[facility] ENDOSCOPY;  Service: Endoscopy;  Laterality: N/A;   INGUINAL HERNIA REPAIR  04/09/06   Bilateral   LAPAROSCOPIC APPENDECTOMY  03/1981   LEFT HEART CATH AND CORONARY ANGIOGRAPHY Left 12/11/2019   Procedure: LEFT HEART CATH AND CORONARY ANGIOGRAPHY;  Surgeon: Nelva Bush, MD;  Location: [place] CV LAB;  Service: Cardiovascular;  Laterality: Left;   REMOVAL OF STONES  07/08/2017   Procedure: REMOVAL OF STONES;  Surgeon: Jackquline Denmark, MD;  Location: [facility] ENDOSCOPY;  Service: Endoscopy;;   SPHINCTEROTOMY  07/08/2017   Procedure: Joan Mayans;  Surgeon: Jackquline Denmark, MD;  Location: [facility] ENDOSCOPY;  Service: Endoscopy;;    Current Meds  Medication Sig   Ascorbic Acid (VITAMIN C) 1000 MG tablet Take 1,000 mg by mouth daily.   aspirin EC 81 MG tablet Take 1 tablet (81 mg total) by mouth daily. Swallow whole.   carvedilol (COREG) 3.125 MG tablet TAKE 1 TABLET(3.125 MG) BY MOUTH  TWICE DAILY   cholecalciferol (VITAMIN D3) 25 MCG (1000 UNIT) tablet Take 2,000 Units by mouth daily.    Coenzyme Q10 100 MG capsule Take 100 mg by mouth daily.   ezetimibe (ZETIA) 10 MG tablet Take 1 tablet (10 mg total) by mouth daily.   famotidine (PEPCID) 20 MG tablet Take 1 tablet  (20 mg total) by mouth 2 (two) times daily as needed for heartburn or indigestion.   Garlic 235 MG TABS Take 500 mg by mouth daily.   icosapent Ethyl (VASCEPA) 1 g capsule Take 2 capsules (2 g total) by mouth 2 (two) times daily.   isosorbide mononitrate (IMDUR) 120 MG 24 hr tablet TAKE 1 TABLET(120 MG) BY MOUTH DAILY   losartan (COZAAR) 50 MG tablet Take 1.5 tablets (75 mg total) by mouth daily.   meclizine (ANTIVERT) 25 MG tablet Take 0.5-1 tablet (12.5 mg- 25 mg) by mouth every 6 hours as needed for dizziness   nitroGLYCERIN (NITROSTAT) 0.4 MG SL tablet Place 1 tablet (0.4 mg total) under the tongue every 5 (five) minutes as needed for chest pain. Maximum of 3 doses.   omeprazole (PRILOSEC) 20 MG capsule Take 20 mg by mouth daily as needed.   tiZANidine (ZANAFLEX) 4 MG tablet Take 0.5-1 tablets (2-4 mg total) by mouth every 8 (eight) hours as needed for muscle spasms (sedation caution).    Allergies: Amoxicillin, Lyrica [pregabalin], Morphine sulfate, Neurontin [gabapentin], and Sulfonamide derivatives  Social History   Tobacco Use   Smoking status: Never   Smokeless tobacco: Never  Vaping Use   Vaping Use: Never used  Substance Use Topics   Alcohol use: No   Drug use: No    Family History  Problem Relation Age of Onset   Cancer Mother        ? GYN, died after hysterectomy   Stroke Father    CAD Father    Hypertension Sister    Prostate cancer Brother    CAD Brother 44       CABG   Bladder Cancer Brother 93       Bladder   Hypertension Brother    Hypertension Brother    Hypertension Brother    Hypertension Brother    Hypertension Brother    Bone cancer Paternal Grandfather    Hypertension Son    Prostate cancer Nephew    Bladder Cancer Nephew    Colon cancer Neg Hx    Esophageal cancer Neg Hx    Rectal cancer Neg Hx    Stomach cancer Neg Hx     Review of Systems: A 12-system review of systems was performed and was negative except as noted in the  HPI.  --------------------------------------------------------------------------------------------------  Physical Exam: BP 136/84 (BP Location: Left Arm, Patient Position: Sitting, Cuff Size: Large)   Pulse 71   Ht '5\' 7"'$  (1.702 m)   Wt 186 lb (84.4 kg)   SpO2 98%   BMI 29.13 kg/m   General:  NAD. Neck: No JVD or HJR. Lungs: Clear to auscultation bilaterally without wheezes or crackles. Heart: Regular rate and rhythm without murmurs, rubs, or gallops. Abdomen: Soft, nontender, nondistended. Extremities: No lower extremity edema.   Lab Results  Component Value Date   WBC 6.3 11/08/2021   HGB 15.1 11/08/2021   HCT 42.0 11/08/2021   MCV 86.1 11/08/2021   PLT 114.0 (L) 11/08/2021    Lab Results  Component Value Date   NA 140 11/15/2021   K 4.2 11/15/2021   CL 107 11/15/2021  CO2 26 11/15/2021   BUN 17 11/15/2021   CREATININE 1.19 11/15/2021   GLUCOSE 90 11/15/2021   ALT 13 11/08/2021    Lab Results  Component Value Date   CHOL 130 11/08/2021   HDL 25.10 (L) 11/08/2021   LDLCALC 48 10/06/2021   LDLDIRECT 62.0 11/08/2021   TRIG (H) 11/08/2021    432.0 Triglyceride is over 400; calculations on Lipids are invalid.   CHOLHDL 5 11/08/2021    --------------------------------------------------------------------------------------------------  ASSESSMENT AND PLAN: Coronary artery disease with stable angina and mixed hyperlipidemia: Mr. Porzio remains asymptomatic without recurrent angina on his current antianginal regimen of carvedilol and isosorbide mononitrate.  Continue secondary prevention with aspirin, ezetimibe, and Vascepa, given statin intolerance.  LDL well-controlled on last check in September, though triglycerides were still quite high.  Hypertension: Blood pressure improved today in spite of Mr. Stukey not increasing losartan to 75 mg daily after his last visit.  We discussed increasing this as previously recommended but will keep his dose at 50 mg daily.  I  encouraged him to stay well-hydrated to minimize risk for dehydration and associated renal dysfunction.  Follow-up: Return to clinic in 6 months.  Nelva Bush, MD 01/18/2022 10:33 AM

## 2022-01-18 NOTE — Patient Instructions (Signed)
Medication Instructions:  Your Physician recommend you continue on your current medication as directed.    Continue Losartan 50 mg daily  *If you need a refill on your cardiac medications before your next appointment, please call your pharmacy*   Lab Work: None ordered today   Testing/Procedures: None ordered today    Follow-Up: At [facility], you and your health needs are our priority.  As part of our continuing mission to provide you with exceptional heart care, we have created designated Provider Care Teams.  These Care Teams include your primary Cardiologist (physician) and Advanced Practice Providers (APPs -  Physician Assistants and Nurse Practitioners) who all work together to provide you with the care you need, when you need it.  We recommend signing up for the patient portal called "MyChart".  Sign up information is provided on this After Visit Summary.  MyChart is used to connect with patients for Virtual Visits (Telemedicine).  Patients are able to view lab/test results, encounter notes, upcoming appointments, etc.  Non-urgent messages can be sent to your provider as well.   To learn more about what you can do with MyChart, go to [email or web address].    Your next appointment:   6 month(s)  The format for your next appointment:   In Person  Provider:   You may see Nelva Bush, MD or one of the following Advanced Practice Providers on your designated Care Team:   Murray Hodgkins, NP Christell Faith, PA-C Cadence Kathlen Mody, PA-C Gerrie Nordmann, NP

## 2022-01-19 ENCOUNTER — Encounter: Payer: Self-pay | Admitting: Internal Medicine

## 2022-03-01 ENCOUNTER — Other Ambulatory Visit: Payer: Self-pay | Admitting: Internal Medicine

## 2022-03-15 DIAGNOSIS — D225 Melanocytic nevi of trunk: Secondary | ICD-10-CM | POA: Diagnosis not present

## 2022-03-15 DIAGNOSIS — Z85828 Personal history of other malignant neoplasm of skin: Secondary | ICD-10-CM | POA: Diagnosis not present

## 2022-03-15 DIAGNOSIS — L821 Other seborrheic keratosis: Secondary | ICD-10-CM | POA: Diagnosis not present

## 2022-03-15 DIAGNOSIS — L814 Other melanin hyperpigmentation: Secondary | ICD-10-CM | POA: Diagnosis not present

## 2022-03-15 DIAGNOSIS — D492 Neoplasm of unspecified behavior of bone, soft tissue, and skin: Secondary | ICD-10-CM | POA: Diagnosis not present

## 2022-03-15 DIAGNOSIS — Z08 Encounter for follow-up examination after completed treatment for malignant neoplasm: Secondary | ICD-10-CM | POA: Diagnosis not present

## 2022-04-11 ENCOUNTER — Other Ambulatory Visit: Payer: Self-pay

## 2022-04-11 MED ORDER — EZETIMIBE 10 MG PO TABS: 10.0000 mg | ORAL_TABLET | Freq: Every day | ORAL | 3 refills | Status: DC

## 2022-05-16 ENCOUNTER — Ambulatory Visit (INDEPENDENT_AMBULATORY_CARE_PROVIDER_SITE_OTHER): Payer: PPO | Admitting: Primary Care

## 2022-05-16 ENCOUNTER — Other Ambulatory Visit: Payer: Self-pay | Admitting: Primary Care

## 2022-05-16 ENCOUNTER — Encounter: Payer: Self-pay | Admitting: Primary Care

## 2022-05-16 VITALS — BP 138/64 | HR 66 | Temp 97.9°F | Ht 67.0 in | Wt 187.0 lb

## 2022-05-16 DIAGNOSIS — R051 Acute cough: Secondary | ICD-10-CM

## 2022-05-16 MED ORDER — BENZONATATE 200 MG PO CAPS: 200.0000 mg | ORAL_CAPSULE | Freq: Three times a day (TID) | ORAL | 0 refills | Status: DC | PRN

## 2022-05-16 MED ORDER — FLUTICASONE PROPIONATE 50 MCG/ACT NA SUSP: 2.0000 | Freq: Every day | NASAL | 0 refills | Status: DC

## 2022-05-16 NOTE — Progress Notes (Signed)
Subjective:    Patient ID: Billy Kim, male    DOB: 03-Sep-1945, 77 y.o.   MRN: [id number]  HPI  Billy Kim is a very pleasant 77 y.o. male patient of Dr. Damita Dunnings with a history of hypertension, CAD, ulcerative colitis, thrombocytopenia who presents today to discuss chest congestion.   Symptom onset 6 days ago with dry cough and sinus congestion. He then developed chest congestion. He denies fevers, body aches, chills, otalgia.   He's tried Dayquil and Nyquil with improvement. Overall he's feeling better and symptoms are improving, he mostly has rhinorrhea and dry cough. He has never smoked.    Review of Systems  Constitutional:  Negative for chills, fatigue and fever.  HENT:  Positive for congestion and rhinorrhea. Negative for sinus pressure.   Respiratory:  Positive for cough. Negative for shortness of breath.   Cardiovascular:  Negative for chest pain.  Neurological:  Negative for dizziness and headaches.         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   Social Determinants of Health   Financial  Resource Strain: Low Risk  (08/04/2020)   Overall Financial Resource Strain (CARDIA)    Difficulty of Paying Living Expenses: Not hard at all  Food Insecurity: No Food Insecurity (08/04/2020)   Hunger Vital Sign    Worried About Running Out of Food in the Last Year: Never true    Ran Out of Food in the Last Year: Never true  Transportation Needs: No Transportation Needs (08/04/2020)   PRAPARE - Hydrologist (Medical): No    Lack of Transportation (Non-Medical): No  Physical Activity: Inactive (08/04/2020)   Exercise Vital Sign    Days of Exercise per Week: 0 days    Minutes of Exercise per Session: 0 min  Stress: No Stress Concern Present (08/04/2020)   [place]    Feeling of Stress : Not at all  Social Connections: Not on file  Intimate Partner Violence: Not At Risk (08/04/2020)   Humiliation, Afraid, Rape, and Kick questionnaire    Fear of Current or Ex-Partner: No    Emotionally Abused: No    Physically Abused: No    Sexually Abused: No    Past Surgical History:  Procedure Laterality Date   CARDIAC CATHETERIZATION  03/20/01   Cardiolite EF 55% 02/10/02   CHOLECYSTECTOMY N/A 07/09/2017   Procedure: LAPAROSCOPIC CHOLECYSTECTOMY;  Surgeon: Hulen Skains,  Jeneen Rinks, MD;  Location: [place];  Service: General;  Laterality: N/A;   COLONOSCOPY  multiple   ENDOSCOPIC RETROGRADE CHOLANGIOPANCREATOGRAPHY (ERCP) WITH PROPOFOL N/A 07/08/2017   Procedure: ENDOSCOPIC RETROGRADE CHOLANGIOPANCREATOGRAPHY (ERCP) WITH PROPOFOL;  Surgeon: Jackquline Denmark, MD;  Location: [facility] ENDOSCOPY;  Service: Endoscopy;  Laterality: N/A;   INGUINAL HERNIA REPAIR  04/09/06   Bilateral   LAPAROSCOPIC APPENDECTOMY  03/1981   LEFT HEART CATH AND CORONARY ANGIOGRAPHY Left 12/11/2019   Procedure: LEFT HEART CATH AND CORONARY ANGIOGRAPHY;  Surgeon: Nelva Bush, MD;  Location: [place] CV [facility];  Service: Cardiovascular;  Laterality: Left;    REMOVAL OF STONES  07/08/2017   Procedure: REMOVAL OF STONES;  Surgeon: Jackquline Denmark, MD;  Location: [facility] ENDOSCOPY;  Service: Endoscopy;;   SPHINCTEROTOMY  07/08/2017   Procedure: Joan Mayans;  Surgeon: Jackquline Denmark, MD;  Location[facility] ENDOSCOPY;  Service: Endoscopy;;    Family History  Problem Relation Age of Onset   Cancer Mother        ? GYN, died after hysterectomy   Stroke Father    CAD Father    Hypertension Sister    Prostate cancer Brother    CAD Brother 23       CABG   Bladder Cancer Brother 4       Bladder   Hypertension Brother    Hypertension Brother    Hypertension Brother    Hypertension Brother    Hypertension Brother    Bone cancer Paternal Grandfather    Hypertension Son    Prostate cancer Nephew    Bladder Cancer Nephew    Colon cancer Neg Hx    Esophageal cancer Neg Hx    Rectal cancer Neg Hx    Stomach cancer Neg Hx     Allergies  Allergen Reactions   Amoxicillin Other (See Comments)    Caused headache Has patient had a PCN reaction causing immediate rash, facial/tongue/throat swelling, SOB or lightheadedness with hypotension: No Has patient had a PCN reaction causing severe rash involving mucus membranes or skin necrosis: No Has patient had a PCN reaction that required hospitalization: No Has patient had a PCN reaction occurring within the last 10 years: No If all of the above answers are "NO", then may proceed with Cephalosporin use.   Lyrica [Pregabalin]     Intolerant, worsening headache   Morphine Sulfate Other (See Comments)    headache   Neurontin [Gabapentin] Other (See Comments)    headache   Sulfonamide Derivatives Other (See Comments)    headache    Current Outpatient Medications on File Prior to Visit  Medication Sig Dispense Refill   Ascorbic Acid (VITAMIN C) 1000 MG tablet Take 1,000 mg by mouth daily.     aspirin EC 81 MG tablet Take 1 tablet (81 mg total) by mouth daily. Swallow whole. 90 tablet 3   carvedilol (COREG)  3.125 MG tablet TAKE 1 TABLET(3.125 MG) BY MOUTH TWICE DAILY 180 tablet 3   cholecalciferol (VITAMIN D3) 25 MCG (1000 UNIT) tablet Take 2,000 Units by mouth daily.      Coenzyme Q10 100 MG capsule Take 100 mg by mouth daily.     ezetimibe (ZETIA) 10 MG tablet Take 1 tablet (10 mg total) by mouth daily. 90 tablet 3   famotidine (PEPCID) 20 MG tablet Take 1 tablet (20 mg total) by mouth 2 (two) times daily as needed for heartburn or indigestion.     Garlic [id number] MG TABS Take 500 mg by mouth daily.  icosapent Ethyl (VASCEPA) 1 g capsule Take 2 capsules (2 g total) by mouth 2 (two) times daily. 120 capsule 3   isosorbide mononitrate (IMDUR) 120 MG 24 hr tablet TAKE 1 TABLET(120 MG) BY MOUTH DAILY 90 tablet 0   losartan (COZAAR) 50 MG tablet Take 1 tablet (50 mg total) by mouth daily. 90 tablet 3   meclizine (ANTIVERT) 25 MG tablet Take 0.5-1 tablet (12.5 mg- 25 mg) by mouth every 6 hours as needed for dizziness 25 tablet 0   nitroGLYCERIN (NITROSTAT) 0.4 MG SL tablet Place 1 tablet (0.4 mg total) under the tongue every 5 (five) minutes as needed for chest pain. Maximum of 3 doses. 25 tablet 1   omeprazole (PRILOSEC) 20 MG capsule Take 20 mg by mouth daily as needed.     tiZANidine (ZANAFLEX) 4 MG tablet Take 0.5-1 tablets (2-4 mg total) by mouth every 8 (eight) hours as needed for muscle spasms (sedation caution). 30 tablet 0   No current facility-administered medications on file prior to visit.    BP 138/64   Pulse 66   Temp 97.9 F (36.6 C) (Oral)   Ht 5\' 7"  (1.702 m)   Wt 187 lb (84.8 kg)   SpO2 98%   BMI 29.29 kg/m  Objective:   Physical Exam Constitutional:      Appearance: He is not ill-appearing.  HENT:     Right Ear: Tympanic membrane and ear canal normal.     Left Ear: Tympanic membrane and ear canal normal.     Nose: No mucosal edema.     Right Sinus: No maxillary sinus tenderness or frontal sinus tenderness.     Left Sinus: No maxillary sinus tenderness or frontal sinus  tenderness.     Mouth/Throat:     Mouth: Mucous membranes are moist.  Eyes:     Conjunctiva/sclera: Conjunctivae normal.  Cardiovascular:     Rate and Rhythm: Normal rate and regular rhythm.  Pulmonary:     Effort: Pulmonary effort is normal.     Breath sounds: Normal breath sounds. No wheezing or rales.  Musculoskeletal:     Cervical back: Neck supple.  Skin:    General: Skin is warm and dry.           Assessment & Plan:  Acute cough Assessment & Plan: Symptoms representative of viral etiology, especially as symptoms are improving.   No red flags on physical exam. He appears overall well.   Start Benzonatate 200 mg three times a day as needed for cough. RX sent.   Start Flonase nasal spray 50 mcg two sprays in both nostrils daily for rhinorrhea. RX sent.  He will update [place] by Monday next week if symptoms are not improving or worsening.   Recommendations give for over the counter for symptom management.   I evaluated patient, was consulted regarding treatment, and agree with assessment and plan per Tinnie Gens, RN, DNP student.   Allie Bossier, NP-C    Orders: -     Fluticasone Propionate; Place 2 sprays into both nostrils daily.  Dispense: 16 g; Refill: 0 -     Benzonatate; Take 1 capsule (200 mg total) by mouth 3 (three) times daily as needed for cough.  Dispense: 15 capsule; Refill: 0        Pleas Koch, NP

## 2022-05-16 NOTE — Patient Instructions (Addendum)
Take benzonatate 1 tablet three times a day as needed.   Flonase nasal spray you can use two sprays in both nostrils daily.   Update Dr. Damita Dunnings by Monday if your symptoms worsen or do not improve.   You can try a few things over the counter to help with your symptoms including:  Cough: Delsym or Robitussin (get the off brand, works just as well) Chest Congestion: Mucinex (plain) Nasal Congestion/Ear Pressure/Sinus Pressure: Try using Flonase (fluticasone) nasal spray. Instill 1 spray in each nostril twice daily. This can be purchased over the counter. Body aches, fevers, headache: Ibuprofen (not to exceed 2400 mg in 24 hours) or Acetaminophen-Tylenol (not to exceed 3000 mg in 24 hours) Runny Nose/Throat Drainage/Sneezing/Itchy or Watery Eyes: An antihistamine such as Zyrtec, Claritin, Xyzal, Allegra   It was a pleasure to see you today!

## 2022-05-16 NOTE — Assessment & Plan Note (Addendum)
Symptoms representative of viral etiology, especially as symptoms are improving.   No red flags on physical exam. He appears overall well.   Start Benzonatate 200 mg three times a day as needed for cough. RX sent.   Start Flonase nasal spray 50 mcg two sprays in both nostrils daily for rhinorrhea. RX sent.  He will update [place] by Monday next week if symptoms are not improving or worsening.   Recommendations give for over the counter for symptom management.   I evaluated patient, was consulted regarding treatment, and agree with assessment and plan per Tinnie Gens, RN, DNP student.   Allie Bossier, NP-C

## 2022-05-16 NOTE — Progress Notes (Signed)
Established Patient Office Visit  Subjective   Patient ID: Billy Kim, male    DOB: Mar 23, 1945  Age: 77 y.o. MRN: [id number]  Chief Complaint  Patient presents with   chest congestion    Chest congestion, dry cough x 1 week  Congestion started in sinuses and now is settled in his chest and he cannot cough up anything.  Declines body aches, chills, fever, ear pain    HPI  Billy Kim is a 77 year old male, patient of Dr. Damita Dunnings, with past medical history HTN, CAD, hypertriglyceridemia, mixed hyperlipidemia, presents today to discuss chest congestion.   Symptoms started six days ago with sore throat, runny nose and sinus pain. He reports that now he still has runny nose and dry cough now. He is not able to cough up anything. He has tried Dayquil and Nyquil with relief. He does report that the symptoms are improving over all. No fever, chills, ear pain or headache. He is coughing day and night about the same amount. No history of smoking.   Patient Active Problem List   Diagnosis Date Noted   Dizziness 11/03/2021   Abscess 08/07/2021   Actinic keratosis 08/07/2021   Melanocytic nevi of trunk 08/07/2021   Nevus of back 08/07/2021   Personal history of other malignant neoplasm of skin 08/07/2021   Medicare annual wellness visit, subsequent 11/09/2020   Coronary artery disease of native artery of native heart with stable angina pectoris ([place]) 12/17/2019   Mixed hyperlipidemia 12/17/2019   Accelerating angina ([place]) 12/11/2019   Shingles 03/01/2019   FH: prostate cancer 02/21/2017   Advance care planning 03/31/2014   PSA elevation 03/27/2013   Thrombocytopenia, unspecified ([place]) 03/27/2013   Acute cough 03/12/2013   ED (erectile dysfunction) 12/28/2011   Neoplasm of uncertain behavior of skin 12/27/2010   Exertional chest pain 01/05/2010   Essential hypertension 04/08/2007   HYPERTRIGLYCERIDEMIA 01/08/2007   GILBERT'S SYNDROME 01/08/2007   ULCERATIVE COLITIS 01/08/2007   Past  Medical History:  Diagnosis Date   Coronary artery disease 01/2020   Moderate proximal LAD disease (not hemodynamically significant) and CTO's of D1 and non-dominant RCA   HLD (hyperlipidemia)    Hypertension    Ulcerative colitis 5/81   Remission for years   Past Surgical History:  Procedure Laterality Date   CARDIAC CATHETERIZATION  03/20/01   Cardiolite EF 55% 02/10/02   CHOLECYSTECTOMY N/A 07/09/2017   Procedure: LAPAROSCOPIC CHOLECYSTECTOMY;  Surgeon: Judeth Horn, MD;  Location: [facility];  Service: General;  Laterality: N/A;   COLONOSCOPY  multiple   ENDOSCOPIC RETROGRADE CHOLANGIOPANCREATOGRAPHY (ERCP) WITH PROPOFOL N/A 07/08/2017   Procedure: ENDOSCOPIC RETROGRADE CHOLANGIOPANCREATOGRAPHY (ERCP) WITH PROPOFOL;  Surgeon: Jackquline Denmark, MD;  Location: [facility] ENDOSCOPY;  Service: Endoscopy;  Laterality: N/A;   INGUINAL HERNIA REPAIR  04/09/06   Bilateral   LAPAROSCOPIC APPENDECTOMY  03/1981   LEFT HEART CATH AND CORONARY ANGIOGRAPHY Left 12/11/2019   Procedure: LEFT HEART CATH AND CORONARY ANGIOGRAPHY;  Surgeon: Nelva Bush, MD;  Location: [facility] CV LAB;  Service: Cardiovascular;  Laterality: Left;   REMOVAL OF STONES  07/08/2017   Procedure: REMOVAL OF STONES;  Surgeon: Jackquline Denmark, MD;  Location: [facility] ENDOSCOPY;  Service: Endoscopy;;   SPHINCTEROTOMY  07/08/2017   Procedure: Joan Mayans;  Surgeon: Jackquline Denmark, MD;  Location: [facility] ENDOSCOPY;  Service: Endoscopy;;   Social History   Tobacco Use   Smoking status: Never   Smokeless tobacco: Never  Vaping Use   Vaping Use: Never used  Substance Use Topics  Alcohol use: No   Drug use: No   Social History   Socioeconomic History   Marital status: Married    Spouse name: Not on file   Number of children: 1   Years of education: Not on file   Highest education level: Not on file  Occupational History   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   Social Determinants of Health   Financial Resource Strain: Low Risk  (08/04/2020)   Overall Financial Resource Strain (CARDIA)    Difficulty of Paying Living Expenses: Not hard at all  Food Insecurity: No Food Insecurity (08/04/2020)   Hunger Vital Sign    Worried About Running Out of Food in the Last Year: Never true    Ran Out of Food in the Last Year: Never true  Transportation Needs: No Transportation Needs (08/04/2020)   PRAPARE - Hydrologist (Medical): No    Lack of Transportation (Non-Medical): No  Physical Activity: Inactive (08/04/2020)   Exercise Vital Sign    Days of Exercise per Week: 0 days    Minutes of Exercise per Session: 0 min  Stress: No Stress Concern Present (08/04/2020)   [place]    Feeling of Stress : Not at all  Social Connections: Not on file  Intimate Partner Violence: Not At Risk (08/04/2020)   Humiliation, Afraid, Rape, and Kick questionnaire    Fear of Current or Ex-Partner: No    Emotionally Abused: No    Physically Abused: No    Sexually Abused: No   Family History  Problem Relation Age of Onset   Cancer Mother        ? GYN, died after hysterectomy   Stroke Father    CAD Father    Hypertension Sister    Prostate cancer Brother    CAD Brother 60       CABG   Bladder Cancer Brother 56       Bladder   Hypertension Brother    Hypertension Brother    Hypertension Brother    Hypertension Brother    Hypertension Brother    Bone cancer Paternal Grandfather    Hypertension Son    Prostate cancer Nephew    Bladder Cancer Nephew     Colon cancer Neg Hx    Esophageal cancer Neg Hx    Rectal cancer Neg Hx    Stomach cancer Neg Hx    Allergies  Allergen Reactions   Amoxicillin Other (See Comments)    Caused headache Has patient had a PCN reaction causing immediate rash, facial/tongue/throat swelling, SOB or lightheadedness with hypotension: No Has patient had a PCN reaction causing severe rash involving mucus membranes or skin necrosis: No Has patient had a PCN reaction that required hospitalization: No Has patient had a PCN reaction occurring within the last 10 years: No If all of the above answers are "NO", then may proceed with Cephalosporin use.   Lyrica [Pregabalin]     Intolerant,  worsening headache   Morphine Sulfate Other (See Comments)    headache   Neurontin [Gabapentin] Other (See Comments)    headache   Sulfonamide Derivatives Other (See Comments)    headache      Review of Systems  Constitutional:  Negative for chills and fever.  HENT:  Negative for ear pain, sinus pain and sore throat.        Rhinorrhea  Respiratory:  Positive for cough. Negative for sputum production and shortness of breath.   Cardiovascular:  Negative for chest pain.  Neurological:  Negative for dizziness and headaches.      Objective:     BP 138/64   Pulse 66   Temp 97.9 F (36.6 C) (Oral)   Ht 5\' 7"  (1.702 m)   Wt 187 lb (84.8 kg)   SpO2 98%   BMI 29.29 kg/m  BP Readings from Last 3 Encounters:  05/16/22 138/64  01/18/22 136/84  11/15/21 (!) 140/92   Wt Readings from Last 3 Encounters:  05/16/22 187 lb (84.8 kg)  01/18/22 186 lb (84.4 kg)  11/15/21 183 lb 9.6 oz (83.3 kg)      Physical Exam Vitals and nursing note reviewed.  Constitutional:      Appearance: Normal appearance.  HENT:     Head:     Salivary Glands: Right salivary gland is not tender. Left salivary gland is not tender.     Right Ear: Tympanic membrane, ear canal and external ear normal.     Left Ear: Tympanic membrane, ear canal and  external ear normal.     Nose: Rhinorrhea present.     Mouth/Throat:     Mouth: Mucous membranes are moist.     Pharynx: Posterior oropharyngeal erythema present.  Cardiovascular:     Rate and Rhythm: Normal rate and regular rhythm.     Pulses: Normal pulses.     Heart sounds: Normal heart sounds.  Pulmonary:     Effort: Pulmonary effort is normal.     Breath sounds: Normal breath sounds.  Neurological:     Mental Status: He is alert and oriented to person, place, and time.  Psychiatric:        Mood and Affect: Mood normal.        Behavior: Behavior normal.      No results found for any visits on 05/16/22.     The 10-year ASCVD risk score (Arnett DK, et al., 2019) is: 40%    Assessment & Plan:   Problem List Items Addressed This Visit       Other   Acute cough - Primary    Viral vs Bacterial upper respiratory infection. Likely viral as symptoms are improving.   No red flags on physical exam.   Start Benzonatate 200 mg three times a day as needed for cough. RX sent.   Start Flonase nasal spray 50 mcg two sprays in both nostrils daily for rhinorrhea. RX sent.  He will update Dr. Damita Dunnings by Monday if symptoms are not improving or worsening.   Recommendations give for over the counter for symptom management.         Relevant Medications   fluticasone (FLONASE) 50 MCG/ACT nasal spray   benzonatate (TESSALON) 200 MG capsule    No follow-ups on file.    Tinnie Gens, BSN-RN, DNP STUDENT

## 2022-06-07 ENCOUNTER — Other Ambulatory Visit: Payer: Self-pay | Admitting: Internal Medicine

## 2022-07-05 DIAGNOSIS — Z8042 Family history of malignant neoplasm of prostate: Secondary | ICD-10-CM | POA: Diagnosis not present

## 2022-07-05 DIAGNOSIS — R972 Elevated prostate specific antigen [PSA]: Secondary | ICD-10-CM | POA: Diagnosis not present

## 2022-07-20 ENCOUNTER — Ambulatory Visit: Payer: PPO | Attending: Internal Medicine | Admitting: Internal Medicine

## 2022-07-20 ENCOUNTER — Encounter: Payer: Self-pay | Admitting: Internal Medicine

## 2022-07-20 ENCOUNTER — Other Ambulatory Visit
Admission: RE | Admit: 2022-07-20 | Discharge: 2022-07-20 | Disposition: A | Discharge status: Home / Self Care | Payer: PPO | Source: Ambulatory Visit | Attending: Internal Medicine | Admitting: Internal Medicine

## 2022-07-20 VITALS — BP 118/78 | HR 56 | Ht 68.0 in | Wt 185.1 lb

## 2022-07-20 DIAGNOSIS — Z79899 Other long term (current) drug therapy: Secondary | ICD-10-CM

## 2022-07-20 DIAGNOSIS — R531 Weakness: Secondary | ICD-10-CM | POA: Diagnosis not present

## 2022-07-20 DIAGNOSIS — E781 Pure hyperglyceridemia: Secondary | ICD-10-CM

## 2022-07-20 DIAGNOSIS — I25118 Atherosclerotic heart disease of native coronary artery with other forms of angina pectoris: Secondary | ICD-10-CM | POA: Diagnosis not present

## 2022-07-20 DIAGNOSIS — M7989 Other specified soft tissue disorders: Secondary | ICD-10-CM

## 2022-07-20 LAB — TSH: TSH: 5.247 u[IU]/mL — ABNORMAL HIGH (ref 0.350–4.500)

## 2022-07-20 LAB — COMPREHENSIVE METABOLIC PANEL
ALT: 19 U/L (ref 0–44)
AST: 26 U/L (ref 15–41)
Albumin: 3.6 g/dL (ref 3.5–5.0)
Alkaline Phosphatase: 60 U/L (ref 38–126)
Anion gap: 6 (ref 5–15)
BUN: 23 mg/dL (ref 8–23)
CO2: 26 mmol/L (ref 22–32)
Calcium: 8.5 mg/dL — ABNORMAL LOW (ref 8.9–10.3)
Chloride: 108 mmol/L (ref 98–111)
Creatinine, Ser: 1.38 mg/dL — ABNORMAL HIGH (ref 0.61–1.24)
GFR, Estimated: 52 mL/min — ABNORMAL LOW (ref >60)
Glucose, Bld: 99 mg/dL (ref 70–99)
Potassium: 3.8 mmol/L (ref 3.5–5.1)
Sodium: 140 mmol/L (ref 135–145)
Total Bilirubin: 2.4 mg/dL — ABNORMAL HIGH (ref 0.3–1.2)
Total Protein: 6.5 g/dL (ref 6.5–8.1)

## 2022-07-20 LAB — MAGNESIUM: Magnesium: 2 mg/dL (ref 1.7–2.4)

## 2022-07-20 LAB — CBC
HCT: 41.3 % (ref 39.0–52.0)
Hemoglobin: 14.7 g/dL (ref 13.0–17.0)
MCH: 30.1 pg (ref 26.0–34.0)
MCHC: 35.6 g/dL (ref 30.0–36.0)
MCV: 84.5 fL (ref 80.0–100.0)
Platelets: 125 10*3/uL — ABNORMAL LOW (ref 150–400)
RBC: 4.89 MIL/uL (ref 4.22–5.81)
RDW: 13.1 % (ref 11.5–15.5)
WBC: 5.9 10*3/uL (ref 4.0–10.5)
nRBC: 0 % (ref 0.0–0.2)

## 2022-07-20 NOTE — Progress Notes (Signed)
Follow-up Outpatient Visit Date: 07/20/2022  Primary Care Provider: Joaquim Nam, MD [address] [place] [address]  Chief Complaint: Follow-up coronary artery disease  HPI:  Billy Kim is a 77 y.o. male with history of coronary artery disease being managed medically (chronic total occlusions of D1 and nondominant RCA as well as moderate proximal LAD disease that was not significant by iFR/FFR), hypertension, and ulcerative colitis, who presents for follow-up of coronary artery disease and hypertension.  I last saw Billy Kim in November, at which time he was feeling well other than intermittent burning in his feet at night.  He denied angina.  We deferred medication changes and additional testing.  Today, Billy Kim reports that he has been feeling fairly well though he notes a single episode of a "slightly uncomfortable feeling" in his lower chest and upper abdomen about a month ago.  He describes it as a steady stabbing pain that lasted 4 to 5 hours.  He took sublingual nitroglycerin and thinks it may have gotten a little bit better, though it did not resolve completely but gradually faded away on its own.  He has not had any further episodes since then.  He does not have any exertional shortness of breath or chest pain.  He also denies palpitations and lightheadedness.  He has occasional dependent ankle edema most noticeable at the Alayasia Breeding of the day.  He is tolerating his medications well.  Billy Kim only other complaint is of occasional episodes during which his whole "system gets a chill."  This seems to be associated with the tingling throughout his body as well as tiredness and weakness.  He does not believe it is focal to 1 part of the body, though he has a hard time explaining it further.  --------------------------------------------------------------------------------------------------  Past Medical History:  Diagnosis Date   Coronary artery disease 01/2020   Moderate  proximal LAD disease (not hemodynamically significant) and CTO's of D1 and non-dominant RCA   HLD (hyperlipidemia)    Hypertension    Ulcerative colitis 5/81   Remission for years   Past Surgical History:  Procedure Laterality Date   CARDIAC CATHETERIZATION  03/20/01   Cardiolite EF 55% 02/10/02   CHOLECYSTECTOMY N/A 07/09/2017   Procedure: LAPAROSCOPIC CHOLECYSTECTOMY;  Surgeon: Jimmye Norman, MD;  Location: [facility] OR;  Service: General;  Laterality: N/A;   COLONOSCOPY  multiple   ENDOSCOPIC RETROGRADE CHOLANGIOPANCREATOGRAPHY (ERCP) WITH PROPOFOL N/A 07/08/2017   Procedure: ENDOSCOPIC RETROGRADE CHOLANGIOPANCREATOGRAPHY (ERCP) WITH PROPOFOL;  Surgeon: Lynann Bologna, MD;  Location: [facility] ENDOSCOPY;  Service: Endoscopy;  Laterality: N/A;   INGUINAL HERNIA REPAIR  04/09/06   Bilateral   LAPAROSCOPIC APPENDECTOMY  03/1981   LEFT HEART CATH AND CORONARY ANGIOGRAPHY Left 12/11/2019   Procedure: LEFT HEART CATH AND CORONARY ANGIOGRAPHY;  Surgeon: Yvonne Kendall, MD;  Location: [facility] INVASIVE CV LAB;  Service: Cardiovascular;  Laterality: Left;   REMOVAL OF STONES  07/08/2017   Procedure: REMOVAL OF STONES;  Surgeon: Lynann Bologna, MD;  Location: [facility] ENDOSCOPY;  Service: Endoscopy;;   SPHINCTEROTOMY  07/08/2017   Procedure: Dennison Mascot;  Surgeon: Lynann Bologna, MD;  Location: [facility] ENDOSCOPY;  Service: Endoscopy;;    Current Meds  Medication Sig   Ascorbic Acid (VITAMIN C) 1000 MG tablet Take 1,000 mg by mouth daily.   aspirin EC 81 MG tablet Take 1 tablet (81 mg total) by mouth daily. Swallow whole.   benzonatate (TESSALON) 200 MG capsule Take 1 capsule (200 mg total) by mouth 3 (three) times  daily as needed for cough.   carvedilol (COREG) 3.125 MG tablet TAKE 1 TABLET(3.125 MG) BY MOUTH TWICE DAILY   cholecalciferol (VITAMIN D3) 25 MCG (1000 UNIT) tablet Take 2,000 Units by mouth daily.    Coenzyme Q10 100 MG capsule Take 100 mg by mouth daily.   ezetimibe (ZETIA) 10 MG tablet Take 1 tablet (10 mg  total) by mouth daily.   famotidine (PEPCID) 20 MG tablet Take 1 tablet (20 mg total) by mouth 2 (two) times daily as needed for heartburn or indigestion.   fluticasone (FLONASE) 50 MCG/ACT nasal spray Place 2 sprays into both nostrils daily.   Garlic 500 MG TABS Take 500 mg by mouth daily.   icosapent Ethyl (VASCEPA) 1 g capsule Take 2 capsules (2 g total) by mouth 2 (two) times daily.   isosorbide mononitrate (IMDUR) 120 MG 24 hr tablet TAKE 1 TABLET(120 MG) BY MOUTH DAILY   losartan (COZAAR) 50 MG tablet Take 1 tablet (50 mg total) by mouth daily.   meclizine (ANTIVERT) 25 MG tablet Take 0.5-1 tablet (12.5 mg- 25 mg) by mouth every 6 hours as needed for dizziness   nitroGLYCERIN (NITROSTAT) 0.4 MG SL tablet Place 1 tablet (0.4 mg total) under the tongue every 5 (five) minutes as needed for chest pain. Maximum of 3 doses.   omeprazole (PRILOSEC) 20 MG capsule Take 20 mg by mouth daily as needed.   tiZANidine (ZANAFLEX) 4 MG tablet Take 0.5-1 tablets (2-4 mg total) by mouth every 8 (eight) hours as needed for muscle spasms (sedation caution).    Allergies: Amoxicillin, Lyrica [pregabalin], Morphine sulfate, Neurontin [gabapentin], and Sulfonamide derivatives  Social History   Tobacco Use   Smoking status: Never   Smokeless tobacco: Never  Vaping Use   Vaping Use: Never used  Substance Use Topics   Alcohol use: No   Drug use: No    Family History  Problem Relation Age of Onset   Cancer Mother        ? GYN, died after hysterectomy   Stroke Father    CAD Father    Hypertension Sister    Prostate cancer Brother    CAD Brother 20       CABG   Bladder Cancer Brother 37       Bladder   Hypertension Brother    Hypertension Brother    Hypertension Brother    Hypertension Brother    Hypertension Brother    Bone cancer Paternal Grandfather    Hypertension Son    Prostate cancer Nephew    Bladder Cancer Nephew    Colon cancer Neg Hx    Esophageal cancer Neg Hx    Rectal cancer  Neg Hx    Stomach cancer Neg Hx     Review of Systems: A 12-system review of systems was performed and was negative except as noted in the HPI.  --------------------------------------------------------------------------------------------------  Physical Exam: BP 118/78 (BP Location: Left Arm, Patient Position: Sitting, Cuff Size: Normal)   Pulse (!) 56   Ht 5\' 8"  (1.727 m)   Wt 185 lb 2 oz (84 kg)   SpO2 97%   BMI 28.15 kg/m   General:  NAD. Neck: No JVD or HJR. Lungs: Clear to auscultation bilaterally without wheezes or crackles. Heart: Bradycardic but regular with 2/6 systolic murmur. Abdomen: Soft, nontender, nondistended. Extremities: Trace pretibial edema bilaterally.  EKG: Sinus bradycardia with first-degree AV block (PR interval 212 ms) and right bundle branch block.  Compared to prior tracing from 11/02/2021,  PR interval has lengthened slightly.  Otherwise, no significant interval change.  Lab Results  Component Value Date   WBC 6.3 11/08/2021   HGB 15.1 11/08/2021   HCT 42.0 11/08/2021   MCV 86.1 11/08/2021   PLT 114.0 (L) 11/08/2021    Lab Results  Component Value Date   NA 140 11/15/2021   K 4.2 11/15/2021   CL 107 11/15/2021   CO2 26 11/15/2021   BUN 17 11/15/2021   CREATININE 1.19 11/15/2021   GLUCOSE 90 11/15/2021   ALT 13 11/08/2021    Lab Results  Component Value Date   CHOL 130 11/08/2021   HDL 25.10 (L) 11/08/2021   LDLCALC 48 10/06/2021   LDLDIRECT 62.0 11/08/2021   TRIG (H) 11/08/2021    432.0 Triglyceride is over 400; calculations on Lipids are invalid.   CHOLHDL 5 11/08/2021    --------------------------------------------------------------------------------------------------  ASSESSMENT AND PLAN: Coronary artery disease with stable angina: Overall, Billy Kim has felt fairly well though he reports 1 episode of atypical chest/epigastric pain about a month ago.  He also has a vague "chill" sensation and tingling throughout his body.  Is  hard to know if this is cardiac or something else.  He feels well today.  We discussed further evaluation and have agreed to obtain a CBC, CMP, magnesium, and TSH today.  We will defer medication changes.  We have agreed to obtain an echocardiogram, particularly given systolic murmur being a little more pronounced on today's exam.  Continue aspirin and ezetimibe for secondary prevention (patient is statin intolerant and has good LDL control).  Will also continue Vascepa with marked hypertriglyceridemia.  Continue antianginal therapy with carvedilol and isosorbide mononitrate; we will need to pay close attention to PR interval which is slightly prolonged today.  Weakness and leg swelling: This does not seem to be focal, though given Billy Kim coronary artery disease and marked hypertriglyceridemia, he is also at risk for cerebrovascular disease.  We will obtain aforementioned labs as well as an echocardiogram and carotid Doppler.  Hyperlipidemia: LDL at goal.  Triglycerides elevated on last check in 10/2021.  Continue ezetimibe and Vascepa, given statin intolerance due to myalgias in the past.  Follow-up: Return to clinic after completion of echocardiogram and carotid Dopplers.  Yvonne Kendall, MD 07/20/2022 8:28 AM

## 2022-07-20 NOTE — Patient Instructions (Signed)
Medication Instructions:  Your Physician recommend you continue on your current medication as directed.     *If you need a refill on your cardiac medications before your next appointment, please call your pharmacy*   Lab Work: Your provider would like for you to have following labs drawn: (CBC, CMP, Mg, TSH).   Please go to the [facility] entrance and check in at the front desk.  You do not need an appointment.  They are open from 7am-6 pm.   If you have labs (blood work) drawn today and your tests are completely normal, you will receive your results only by: MyChart Message (if you have MyChart) OR A paper copy in the mail If you have any lab test that is abnormal or we need to change your treatment, we will call you to review the results.   Testing/Procedures: Your physician has requested that you have an echocardiogram. Echocardiography is a painless test that uses sound waves to create images of your heart. It provides your doctor with information about the size and shape of your heart and how well your heart's chambers and valves are working.   You may receive an ultrasound enhancing agent through an IV if needed to better visualize your heart during the echo. This procedure takes approximately one hour.  There are no restrictions for this procedure.  This will take place at [address] [address], [place] [address]  Your physician has requested that you have a carotid duplex. This test is an ultrasound of the carotid arteries in your neck. It looks at blood flow through these arteries that supply the brain with blood.   Allow one hour for this exam.  There are no restrictions or special instructions.  This will take place at [address] ([facility], [place] [address]    Follow-Up: At [facility], you and your health needs are our priority.  As part of our continuing mission to provide you with exceptional  heart care, we have created designated Provider Care Teams.  These Care Teams include your primary Cardiologist (physician) and Advanced Practice Providers (APPs -  Physician Assistants and Nurse Practitioners) who all work together to provide you with the care you need, when you need it.  We recommend signing up for the patient portal called "MyChart".  Sign up information is provided on this After Visit Summary.  MyChart is used to connect with patients for Virtual Visits (Telemedicine).  Patients are able to view lab/test results, encounter notes, upcoming appointments, etc.  Non-urgent messages can be sent to your provider as well.   To learn more about what you can do with MyChart, go to [email or web address].    Your next appointment:   1 month(s)  Provider:   You may see Yvonne Kendall, MD or one of the following Advanced Practice Providers on your designated Care Team:   Nicolasa Ducking, NP Eula Listen, PA-C Cadence Fransico Michael, PA-C Charlsie Quest, NP

## 2022-07-22 ENCOUNTER — Other Ambulatory Visit: Payer: Self-pay | Admitting: Family Medicine

## 2022-07-22 DIAGNOSIS — R7989 Other specified abnormal findings of blood chemistry: Secondary | ICD-10-CM

## 2022-07-25 ENCOUNTER — Encounter: Payer: Self-pay | Admitting: Internal Medicine

## 2022-07-25 ENCOUNTER — Inpatient Hospital Stay
Admission: EM | Admit: 2022-07-25 | Discharge: 2022-07-31 | DRG: 065 | Disposition: A | Discharge status: Rehabilitation Facility | Payer: PPO | Attending: Internal Medicine | Admitting: Internal Medicine

## 2022-07-25 ENCOUNTER — Emergency Department: Payer: PPO

## 2022-07-25 ENCOUNTER — Other Ambulatory Visit: Payer: Self-pay

## 2022-07-25 DIAGNOSIS — Z8249 Family history of ischemic heart disease and other diseases of the circulatory system: Secondary | ICD-10-CM | POA: Diagnosis not present

## 2022-07-25 DIAGNOSIS — E781 Pure hyperglyceridemia: Secondary | ICD-10-CM | POA: Diagnosis not present

## 2022-07-25 DIAGNOSIS — Z823 Family history of stroke: Secondary | ICD-10-CM | POA: Diagnosis not present

## 2022-07-25 DIAGNOSIS — R2689 Other abnormalities of gait and mobility: Secondary | ICD-10-CM | POA: Diagnosis present

## 2022-07-25 DIAGNOSIS — I2510 Atherosclerotic heart disease of native coronary artery without angina pectoris: Secondary | ICD-10-CM | POA: Diagnosis not present

## 2022-07-25 DIAGNOSIS — N179 Acute kidney failure, unspecified: Secondary | ICD-10-CM

## 2022-07-25 DIAGNOSIS — R4701 Aphasia: Secondary | ICD-10-CM | POA: Diagnosis not present

## 2022-07-25 DIAGNOSIS — Z79899 Other long term (current) drug therapy: Secondary | ICD-10-CM | POA: Diagnosis not present

## 2022-07-25 DIAGNOSIS — I629 Nontraumatic intracranial hemorrhage, unspecified: Secondary | ICD-10-CM | POA: Diagnosis not present

## 2022-07-25 DIAGNOSIS — Z9049 Acquired absence of other specified parts of digestive tract: Secondary | ICD-10-CM | POA: Diagnosis not present

## 2022-07-25 DIAGNOSIS — I610 Nontraumatic intracerebral hemorrhage in hemisphere, subcortical: Secondary | ICD-10-CM | POA: Diagnosis not present

## 2022-07-25 DIAGNOSIS — Z885 Allergy status to narcotic agent status: Secondary | ICD-10-CM | POA: Diagnosis not present

## 2022-07-25 DIAGNOSIS — I10 Essential (primary) hypertension: Secondary | ICD-10-CM | POA: Diagnosis present

## 2022-07-25 DIAGNOSIS — Z7409 Other reduced mobility: Secondary | ICD-10-CM | POA: Diagnosis not present

## 2022-07-25 DIAGNOSIS — R29810 Facial weakness: Secondary | ICD-10-CM | POA: Diagnosis not present

## 2022-07-25 DIAGNOSIS — Z880 Allergy status to penicillin: Secondary | ICD-10-CM | POA: Diagnosis not present

## 2022-07-25 DIAGNOSIS — R29703 NIHSS score 3: Secondary | ICD-10-CM | POA: Diagnosis present

## 2022-07-25 DIAGNOSIS — R4781 Slurred speech: Secondary | ICD-10-CM | POA: Diagnosis not present

## 2022-07-25 DIAGNOSIS — I6389 Other cerebral infarction: Secondary | ICD-10-CM | POA: Diagnosis not present

## 2022-07-25 DIAGNOSIS — R471 Dysarthria and anarthria: Secondary | ICD-10-CM | POA: Diagnosis present

## 2022-07-25 DIAGNOSIS — Z882 Allergy status to sulfonamides status: Secondary | ICD-10-CM

## 2022-07-25 DIAGNOSIS — I25810 Atherosclerosis of coronary artery bypass graft(s) without angina pectoris: Secondary | ICD-10-CM | POA: Diagnosis not present

## 2022-07-25 DIAGNOSIS — Z7982 Long term (current) use of aspirin: Secondary | ICD-10-CM

## 2022-07-25 DIAGNOSIS — I161 Hypertensive emergency: Secondary | ICD-10-CM

## 2022-07-25 DIAGNOSIS — I619 Nontraumatic intracerebral hemorrhage, unspecified: Secondary | ICD-10-CM | POA: Diagnosis not present

## 2022-07-25 DIAGNOSIS — R0682 Tachypnea, not elsewhere classified: Secondary | ICD-10-CM | POA: Diagnosis not present

## 2022-07-25 DIAGNOSIS — Z8673 Personal history of transient ischemic attack (TIA), and cerebral infarction without residual deficits: Secondary | ICD-10-CM | POA: Diagnosis not present

## 2022-07-25 DIAGNOSIS — R29818 Other symptoms and signs involving the nervous system: Secondary | ICD-10-CM | POA: Diagnosis not present

## 2022-07-25 DIAGNOSIS — E785 Hyperlipidemia, unspecified: Secondary | ICD-10-CM | POA: Diagnosis not present

## 2022-07-25 DIAGNOSIS — Z888 Allergy status to other drugs, medicaments and biological substances status: Secondary | ICD-10-CM | POA: Diagnosis not present

## 2022-07-25 LAB — CBC
HCT: 42.3 % (ref 39.0–52.0)
Hemoglobin: 14.8 g/dL (ref 13.0–17.0)
MCH: 30.3 pg (ref 26.0–34.0)
MCHC: 35 g/dL (ref 30.0–36.0)
MCV: 86.7 fL (ref 80.0–100.0)
Platelets: 125 10*3/uL — ABNORMAL LOW (ref 150–400)
RBC: 4.88 MIL/uL (ref 4.22–5.81)
RDW: 13.2 % (ref 11.5–15.5)
WBC: 5.6 10*3/uL (ref 4.0–10.5)
nRBC: 0 % (ref 0.0–0.2)

## 2022-07-25 LAB — COMPREHENSIVE METABOLIC PANEL
ALT: 18 U/L (ref 0–44)
AST: 24 U/L (ref 15–41)
Albumin: 4 g/dL (ref 3.5–5.0)
Alkaline Phosphatase: 65 U/L (ref 38–126)
Anion gap: 8 (ref 5–15)
BUN: 26 mg/dL — ABNORMAL HIGH (ref 8–23)
CO2: 26 mmol/L (ref 22–32)
Calcium: 8.8 mg/dL — ABNORMAL LOW (ref 8.9–10.3)
Chloride: 106 mmol/L (ref 98–111)
Creatinine, Ser: 1.42 mg/dL — ABNORMAL HIGH (ref 0.61–1.24)
GFR, Estimated: 51 mL/min — ABNORMAL LOW (ref >60)
Glucose, Bld: 107 mg/dL — ABNORMAL HIGH (ref 70–99)
Potassium: 4.3 mmol/L (ref 3.5–5.1)
Sodium: 140 mmol/L (ref 135–145)
Total Bilirubin: 2.4 mg/dL — ABNORMAL HIGH (ref 0.3–1.2)
Total Protein: 6.8 g/dL (ref 6.5–8.1)

## 2022-07-25 LAB — DIFFERENTIAL
Abs Immature Granulocytes: 0.02 10*3/uL (ref 0.00–0.07)
Basophils Absolute: 0 10*3/uL (ref 0.0–0.1)
Basophils Relative: 1 %
Eosinophils Absolute: 0.2 10*3/uL (ref 0.0–0.5)
Eosinophils Relative: 4 %
Immature Granulocytes: 0 %
Lymphocytes Relative: 20 %
Lymphs Abs: 1.1 10*3/uL (ref 0.7–4.0)
Monocytes Absolute: 0.6 10*3/uL (ref 0.1–1.0)
Monocytes Relative: 11 %
Neutro Abs: 3.6 10*3/uL (ref 1.7–7.7)
Neutrophils Relative %: 64 %

## 2022-07-25 LAB — PROTIME-INR
INR: 1.2 (ref 0.8–1.2)
Prothrombin Time: 15.3 seconds — ABNORMAL HIGH (ref 11.4–15.2)

## 2022-07-25 LAB — GLUCOSE, CAPILLARY: Glucose-Capillary: 116 mg/dL — ABNORMAL HIGH (ref 70–99)

## 2022-07-25 LAB — MRSA NEXT GEN BY PCR, NASAL: MRSA by PCR Next Gen: NOT DETECTED

## 2022-07-25 LAB — APTT: aPTT: 30 seconds (ref 24–36)

## 2022-07-25 LAB — CBG MONITORING, ED: Glucose-Capillary: 106 mg/dL — ABNORMAL HIGH (ref 70–99)

## 2022-07-25 LAB — ETHANOL: Alcohol, Ethyl (B): <10 mg/dL (ref <10)

## 2022-07-25 MED ORDER — ACETAMINOPHEN 325 MG PO TABS: 650.0000 mg | ORAL_TABLET | ORAL | Status: DC | PRN

## 2022-07-25 MED ORDER — PANTOPRAZOLE SODIUM 40 MG IV SOLR
40.0000 mg | Freq: Every day | INTRAVENOUS | Status: DC
  Administered 2022-07-25 – 2022-07-26 (×2): 40 mg via INTRAVENOUS
  Filled 2022-07-25 (×2): qty 10

## 2022-07-25 MED ORDER — ACETAMINOPHEN 650 MG RE SUPP: 650.0000 mg | RECTAL | Status: DC | PRN

## 2022-07-25 MED ORDER — ACETAMINOPHEN 160 MG/5ML PO SOLN: 650.0000 mg | ORAL | Status: DC | PRN

## 2022-07-25 MED ORDER — POLYETHYLENE GLYCOL 3350 17 G PO PACK: 17.0000 g | PACK | Freq: Every day | ORAL | Status: DC | PRN

## 2022-07-25 MED ORDER — CLEVIDIPINE BUTYRATE 0.5 MG/ML IV EMUL
INTRAVENOUS | Status: AC
  Filled 2022-07-25: qty 50

## 2022-07-25 MED ORDER — CHLORHEXIDINE GLUCONATE CLOTH 2 % EX PADS
6.0000 | MEDICATED_PAD | Freq: Every day | CUTANEOUS | Status: DC
  Administered 2022-07-25: 6 via TOPICAL

## 2022-07-25 MED ORDER — DOCUSATE SODIUM 100 MG PO CAPS: 100.0000 mg | ORAL_CAPSULE | Freq: Two times a day (BID) | ORAL | Status: DC | PRN

## 2022-07-25 MED ORDER — IOHEXOL 350 MG/ML SOLN
75.0000 mL | Freq: Once | INTRAVENOUS | Status: AC | PRN
  Administered 2022-07-25: 75 mL via INTRAVENOUS

## 2022-07-25 MED ORDER — SENNOSIDES-DOCUSATE SODIUM 8.6-50 MG PO TABS
1.0000 | ORAL_TABLET | Freq: Two times a day (BID) | ORAL | Status: DC
  Administered 2022-07-26 – 2022-07-31 (×7): 1 via ORAL
  Filled 2022-07-25 (×10): qty 1

## 2022-07-25 MED ORDER — LABETALOL HCL 5 MG/ML IV SOLN: 10.0000 mg | Freq: Once | INTRAVENOUS | Status: AC

## 2022-07-25 MED ORDER — STROKE: EARLY STAGES OF RECOVERY BOOK: Freq: Once | Status: DC

## 2022-07-25 MED ORDER — CLEVIDIPINE BUTYRATE 0.5 MG/ML IV EMUL
0.0000 mg/h | INTRAVENOUS | Status: DC
  Administered 2022-07-25: 6 mg/h via INTRAVENOUS
  Administered 2022-07-25: 2 mg/h via INTRAVENOUS
  Administered 2022-07-26 (×2): 6 mg/h via INTRAVENOUS
  Administered 2022-07-26: 4 mg/h via INTRAVENOUS
  Filled 2022-07-25 (×6): qty 50

## 2022-07-25 MED ORDER — LABETALOL HCL 5 MG/ML IV SOLN
INTRAVENOUS | Status: AC
  Administered 2022-07-25: 10 mg via INTRAVENOUS
  Filled 2022-07-25: qty 4

## 2022-07-25 MED ORDER — SODIUM CHLORIDE 0.9% FLUSH
3.0000 mL | Freq: Once | INTRAVENOUS | Status: AC
  Administered 2022-07-25: 3 mL via INTRAVENOUS

## 2022-07-25 NOTE — Progress Notes (Signed)
14 - Code Stroke Activated via stroke cart  Patient already in CT. mRS 0, LKWT 1700, balance issues, word finding difficulty, R facial droop 1857 - Dr. Selina Cooley paged to stroke cart 1 1859 - Dr. Selina Cooley on stroke cart 1900 -  Dr. Selina Cooley performing NIH- NIH 2  1900 - Dr. Selina Cooley reviewed CT head

## 2022-07-25 NOTE — ED Notes (Signed)
Report given to Holly, RN in ICU.

## 2022-07-25 NOTE — H&P (Incomplete)
NAME:  Billy Kim, MRN:  [id number], DOB:  [birth year], LOS: 0 ADMISSION DATE:  07/25/2022, CONSULTATION DATE:  07/25/22 REFERRING MD:  Dr. Rosalia Hammers, CHIEF COMPLAINT:  CODE STROKE   History of Present Illness:  77 year old male presenting to [facility] ED from home on 07/25/2022 for evaluation as a code stroke.  History provided per bedside patient report, correlating spouse report bedside and chart review. Patient was in his normal state of health when he took a shower this evening around 16:50.  Upon getting out of the shower around 17:00 his wife noticed he seemed " off".  While attempting to get dressed he seemed to have difficulty putting on his socks, also some balance difficulties requiring him to hold onto the bed & when his son called to speak with him he was having difficulty " getting the words out".  She also thinks that she noticed some slight slurred speech.  The patient describes not feeling like himself and knowing something was wrong.  He denied headache, blurred vision, dizziness, sick symptoms or sick contacts.  He did endorse feeling off balance, having difficulty getting out what he wanted to say and possible weakness in bilateral lower extremities. Of note patient is being followed for hypertension.  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  ED course: Upon arrival patient worked up for code stroke.  Speech was documented to have improved after arrival to ED. initial vitals showed hypertension with BP 184/98. Initial NIH stroke scale documented as a 2 & imaging revealed acute small left thalamic bleed measuring 1.5 cm.  Medications given: Labetalol, IV contrast and clevidipine drip started Initial Vitals: 98.5, 12, 60, 184/98 and 95% on room air Significant labs: (Labs/ Imaging personally reviewed) Chemistry: Na+: 140, K+: 4.3, BUN/Cr.:  26/1.42, Serum CO2/ AG: 26/8, TBili: 2.4 Hematology: WBC: 5.6,  Hgb: 14.8,    CT head without contrast 07/25/2022: Acute intraparenchymal hemorrhage in the left thalamus measuring 1.8 x 1.4 x 1.2 cm (approximately 1.5 mL). No midline shift or other mass effect. CT angio head code stroke 07/25/2022:  Normal CTA of the head.  No aneurysm or vascular malformation. Unchanged appearance of left thalamic intraparenchymal hematoma, most consistent with hypertensive hemorrhage.  PCCM consulted for admission due to acute small left thalamic hemorrhage in the setting of uncontrolled hypertension requiring clevidipine drip.  Pertinent  Medical History  CAD (with chronic total occlusions of D1 & non-dominant RCA as well as moderate proximal LAD dx- medical management) Ulcerative Colitis (remission for years) HTN HLD (hypertriglyceridemia)  Significant Hospital Events: Including procedures, antibiotic start and stop dates in addition to other pertinent events   07/25/22: Admit to ICU with acute small left thalamic hemorrhage in the setting of uncontrolled hypertension requiring clevidipine drip.  Interim History / Subjective:  Patient alert and responsive current NIHSS 1 for mild aphasia. Spouse and son bedside plan of care discussed and all questions and concerns answered at this time  Objective   Blood pressure (!) 152/86, pulse 69, temperature 98.5 F (36.9 C), resp. rate 17, SpO2 97 %.       No intake or output data in the 24 hours ending 07/25/22 2051 There were no vitals filed for this visit.  Examination: General: Adult male, critically ill, lying in bed, NAD HEENT: MM pink/moist, anicteric, atraumatic, neck supple Neuro: A&O x 4, able to follow commands, PERRL +3, MAE  CN: II - Visual field intact OU III, IV, VI - Extraocular movements intact. V - Facial sensation intact bilaterally. VII - Facial movement intact bilaterally VIII - Hearing & vestibular intact bilaterally X - Palate elevates symmetrically XI - Chin turning & shoulder shrug intact  bilaterally. XII - Tongue protrusion intact Motor Strength - strength 5/5 all extremities and pronator drift was absent.  Sensory - Light touch was assessed and was symmetrical Coordination - The patient had normal movements in the hands and feet with no ataxia or dysmetria.  Tremor was absent Gait and Station - deferred.  NIHSS Level of  Consciousness: 0 = Alert, keenly responsive; 1 = Not alert, but arousable by minor stimulation to obey, answer, or respond; 2 = Not alert, requires repeated stimulation to attend, or is obtunded and requires strong or painful stimulation to make movements (not stereotyped); 3 = Responds only with reflex motor or autonomic effects or totally unresponsive, flaccid, and areflexic   0  LOC Questions 0 = Answers both questions correctly; 1 = Answers one question correctly; 2 = Answers neither question correctly.  0  LOC Commands 0 = Performs both tasks correctly; 1 = Performs one task correctly; 2 = Performs neither task correctly  0  Best Gaze  0 = Normal; 1 = Partial gaze palsy; gaze is abnormal in one or both eyes, but forced deviation or total gaze paresis is not present; 2 = Forced deviation, or total gaze paresis not overcome by the oculocephalic maneuver   0  Visual 0 = No visual loss; 1 = Partial hemianopia; 2 = Complete hemianopia; 3 = Bilateral hemianopia (blind including cortical blindness).   0  Facial Palsy 0 = Normal symmetrical movements; 1 = Minor paralysis (flattened nasolabial fold, asymmetry on smiling); 2 = Partial paralysis (total or near-total paralysis of lower face); 3 = Complete paralysis of one or both sides (absence of facial movement in the upper and lower face).  0  Motor Arm LEFT 0 = No drift; limb holds 90 (or 45) degrees for full 10 secs; 1 = Drift; limb holds 90 (or 45) degrees, but drifts down before full 10 seconds; does not hit bed or other support; 2 = Some effort against gravity; limb cannot get to or maintain (if cued) 90 (or 45)  degrees, drifts down to bed, but has some effort against gravity; 3 = No effort against gravity; limb falls; 4 = No movement; UN = unable to test (Amputation or joint fusion).   0  Motor Arm RIGHT 0 = No drift; limb holds 90 (or 45) degrees for full 10 secs; 1 = Drift; limb holds 90 (or 45) degrees, but drifts down before full 10 seconds; does not hit bed or other support; 2 = Some effort against gravity; limb cannot get to or maintain (if cued) 90 (or 45) degrees, drifts down to bed, but has some effort against gravity; 3 = No effort against gravity; limb falls; 4 = No movement; UN = unable to test (Amputation or joint fusion).   0  Motor Leg LEFT 0 = No drift; leg holds 30 degree position for full 5 secs; 1 = Drift; leg falls by the end of the 5-sec period but does not hit bed; 2 = Some effort against gravity; leg falls to bed by 5 secs, but has some effort against gravity; 3 = No effort against gravity; leg falls to bed immediately; 4 = No movement; UN = unable to test (Amputation or joint  fusion).   0  Motor Leg RIGHT 0 = No drift; leg holds 30 degree position for full 5 secs; 1 = Drift; leg falls by the end of the 5-sec period but does not hit bed; 2 = Some effort against gravity; leg falls to bed by 5 secs, but has some effort against gravity; 3 = No effort against gravity; leg falls to bed immediately; 4 = No movement; UN = unable to test (Amputation or joint fusion).   0  Limb Ataxia 0 = Absent; 1 = Present in one limb; 2 = Present in two limbs; UN = Amputation or joint fusion   0  Sensory  0 = Normal, no sensory loss; 1 = Mild-to-moderate sensory loss, patient feels pinprick is less sharp or is dull on the affected side, or there is a loss of superficial pain with pinprick, but patient is aware of being touched; 2 = Severe to total sensory loss, patient is not aware of being touched in the face, arm, and leg.   0  Best Language 0 = No aphasia; normal; 1 = Mild-to-moderate aphasia; some obvious loss  of fluency or facility of comprehension, w/o significant limitation on ideas expressed or form of expression. Reduction of speech and/or comprehension, however, makes conversation about provided materials difficult or impossible. For example, in conversation about provided materials, examiner can identify picture or naming card content from patient's response; 2 = Severe aphasia; all communication is through fragmentary expression; great need for inference, questioning, and guessing by the listener. Range of information that can be exchanged is limited; listener carries burden of communication. Examiner cannot identify materials provided from patient response; 3 = Mute, global aphasia; no usable speech or auditory comprehension  1  Dysarthria 0 = Normal; 1 = Mild-to-moderate dysarthria, patient slurs at least some words and, at worst, can be understood with some difficulty; 2 = Severe dysarthria, patient's speech is so slurred as to be unintelligible in the absence of or out of proportion to any dysphasia, or is mute/anarthric; UN = Intubated or other physical barrier  0  Extinction/Inattention 0 = No abnormality 1 = Visual, tactile, auditory, spatial, or personal extinction/inattention to bilateral simultaneous stimulation in one of the sensory modalities. 2 = Profound hemi-inattention or extinction to more than one modality; does not recognize own hand or orients to only one side of space.  0  Total   1  CV: s1s2 RRR, NSR on monitor, no r/g +murmur Pulm: Regular, non labored on RA , breath sounds clear-BUL & clear/diminished-BLL GI: soft, rounded, non tender, bs x 4 Skin: no rashes/lesions noted Extremities: warm/dry, pulses + 2 R/P, no edema noted  Resolved Hospital Problem list     Assessment & Plan:  Small left thalamic hemorrhage suspect secondary to hypertension - Goal SBP < 150 using clevidipine drip, wean as tolerated - HOB >= 30 degrees with aspiration precautions - Q1 neuro checks --  get stat head CT and call Neurology if acute change - f/u CT head 1 AM - INR at goal of <1.4, follow-up as needed -Echocardiogram ordered -Patient did not pass bedside swallow eval, SLP evaluation ordered - if sedation is necessary: would recommend dex for sedation and ease of exam  - MRI follow-up in a.m. - No HepSQ, antiplatelet or anticoagulant agents at this time, SCDs - Maintain strict euglycemia, euthermia, and euvolemia  - Neurosurgery consulted by neurology, appreciate input -Neurology following, appreciate input  Hypertension CAD - Goal SBP < 150 using clevidipine drip,  wean as tolerated -Consider restarting outpatient p.o. medications once stabilized -Continuous cardiac monitoring  Hyperlipidemia - f/u lipid panel - continue home regimen once cleared to take p.o. medication: Vascepa & Zetia  Acute Kidney Injury Query CKD? Baseline Cr: 1.19 (however 5/31 Cr elevated at 1.38), Cr on admission: 1.42 - Strict I/O's: alert provider if UOP < 0.5 mL/kg/hr - 1 L IVF bolus due to IV contrast administration - Daily BMP, replace electrolytes PRN - Avoid nephrotoxic agents as able, ensure adequate renal perfusion - consider renal US  Ulcerative colitis-in remission  Best Practice (right click and "Reselect all SmartList Selections" daily)  Diet/type: NPO DVT prophylaxis: SCD GI prophylaxis: PPI Lines: N/A Foley:  N/A Code Status:  full code Last date of multidisciplinary goals of care discussion [07/25/22]  Labs   CBC: Recent Labs  Lab 07/20/22 0924 07/25/22 1849  WBC 5.9 5.6  NEUTROABS  --  3.6  HGB 14.7 14.8  HCT 41.3 42.3  MCV 84.5 86.7  PLT 125* 125*    Basic Metabolic Panel: Recent Labs  Lab 07/20/22 0924 07/25/22 1849  NA 140 140  K 3.8 4.3  CL 108 106  CO2 26 26  GLUCOSE 99 107*  BUN 23 26*  CREATININE 1.38* 1.42*  CALCIUM 8.5* 8.8*  MG 2.0  --    GFR: Estimated Creatinine Clearance: 45.2 mL/min (A) (by C-G formula based on SCr of 1.42 mg/dL  (H)). Recent Labs  Lab 07/20/22 0924 07/25/22 1849  WBC 5.9 5.6    Liver Function Tests: Recent Labs  Lab 07/20/22 0924 07/25/22 1849  AST 26 24  ALT 19 18  ALKPHOS 60 65  BILITOT 2.4* 2.4*  PROT 6.5 6.8  ALBUMIN 3.6 4.0   No results for input(s): "LIPASE", "AMYLASE" in the last 168 hours. No results for input(s): "AMMONIA" in the last 168 hours.  ABG No results found for: "PHART", "PCO2ART", "PO2ART", "HCO3", "TCO2", "ACIDBASEDEF", "O2SAT"   Coagulation Profile: Recent Labs  Lab 07/25/22 1849  INR 1.2    Cardiac Enzymes: No results for input(s): "CKTOTAL", "CKMB", "CKMBINDEX", "TROPONINI" in the last 168 hours.  HbA1C: No results found for: "HGBA1C"  CBG: Recent Labs  Lab 07/25/22 1846  GLUCAP 106*    Review of Systems: positives in [place]  Gen: Denies fever, chills, weight change, fatigue, night sweats HEENT: Denies blurred vision, double vision, hearing loss, tinnitus, sinus congestion, rhinorrhea, sore throat, neck stiffness, dysphagia PULM: Denies shortness of breath, cough, sputum production, hemoptysis, wheezing CV: Denies chest pain, edema, orthopnea, paroxysmal nocturnal dyspnea, palpitations GI: Denies abdominal pain, nausea, vomiting, diarrhea, hematochezia, melena, constipation, change in bowel habits GU: Denies dysuria, hematuria, polyuria, oliguria, urethral discharge Endocrine: Denies hot or cold intolerance, polyuria, polyphagia or appetite change Derm: Denies rash, dry skin, scaling or peeling skin change Heme: Denies easy bruising, bleeding, bleeding gums Neuro: Denies headache, numbness, weakness, word finding difficulties, balance problems slurred speech, loss of memory or consciousness  Past Medical History:  He,  has a past medical history of Coronary artery disease (01/2020), HLD (hyperlipidemia), Hypertension, and Ulcerative colitis (5/81).   Surgical History:   Past Surgical History:  Procedure Laterality Date   CARDIAC  CATHETERIZATION  03/20/01   Cardiolite EF 55% 02/10/02   CHOLECYSTECTOMY N/A 07/09/2017   Procedure: LAPAROSCOPIC CHOLECYSTECTOMY;  Surgeon: Jimmye Norman, MD;  Location: [facility] OR;  Service: General;  Laterality: N/A;   COLONOSCOPY  multiple   ENDOSCOPIC RETROGRADE CHOLANGIOPANCREATOGRAPHY (ERCP) WITH PROPOFOL N/A 07/08/2017   Procedure: ENDOSCOPIC RETROGRADE CHOLANGIOPANCREATOGRAPHY (ERCP)  WITH PROPOFOL;  Surgeon: Lynann Bologna, MD;  Location: [facility] ENDOSCOPY;  Service: Endoscopy;  Laterality: N/A;   INGUINAL HERNIA REPAIR  04/09/06   Bilateral   LAPAROSCOPIC APPENDECTOMY  03/1981   LEFT HEART CATH AND CORONARY ANGIOGRAPHY Left 12/11/2019   Procedure: LEFT HEART CATH AND CORONARY ANGIOGRAPHY;  Surgeon: Yvonne Kendall, MD;  Location: [facility] INVASIVE CV LAB;  Service: Cardiovascular;  Laterality: Left;   REMOVAL OF STONES  07/08/2017   Procedure: REMOVAL OF STONES;  Surgeon: Lynann Bologna, MD;  Location: [facility] ENDOSCOPY;  Service: Endoscopy;;   SPHINCTEROTOMY  07/08/2017   Procedure: Dennison Mascot;  Surgeon: Lynann Bologna, MD;  Location: [facility] ENDOSCOPY;  Service: Endoscopy;;     Social History:   reports that he has never smoked. He has never used smokeless tobacco. He reports that he does not drink alcohol and does not use drugs.   Family History:  His family history includes Bladder Cancer in his nephew; Bladder Cancer (age of onset: 32) in his brother; Bone cancer in his paternal grandfather; CAD in his father; CAD (age of onset: 59) in his brother; Cancer in his mother; Hypertension in his brother, brother, brother, brother, brother, sister, and son; Prostate cancer in his brother and nephew; Stroke in his father. There is no history of Colon cancer, Esophageal cancer, Rectal cancer, or Stomach cancer.   Allergies Allergies  Allergen Reactions   Amoxicillin Other (See Comments)    Caused headache Has patient had a PCN reaction causing immediate rash, facial/tongue/throat swelling, SOB or  lightheadedness with hypotension: No Has patient had a PCN reaction causing severe rash involving mucus membranes or skin necrosis: No Has patient had a PCN reaction that required hospitalization: No Has patient had a PCN reaction occurring within the last 10 years: No If all of the above answers are "NO", then may proceed with Cephalosporin use.   Lyrica [Pregabalin]     Intolerant, worsening headache   Morphine Sulfate Other (See Comments)    headache   Neurontin [Gabapentin] Other (See Comments)    headache   Sulfonamide Derivatives Other (See Comments)    headache     Home Medications  Prior to Admission medications   Medication Sig Start Date End Date Taking? Authorizing Provider  Ascorbic Acid (VITAMIN C) 1000 MG tablet Take 1,000 mg by mouth daily.   Yes [provider]  aspirin EC 81 MG tablet Take 1 tablet (81 mg total) by mouth daily. Swallow whole. 12/09/19  Yes End, Cristal Deer, MD  benzonatate (TESSALON) 200 MG capsule Take 1 capsule (200 mg total) by mouth 3 (three) times daily as needed for cough. 05/16/22  Yes Doreene Nest, NP  carvedilol (COREG) 3.125 MG tablet TAKE 1 TABLET(3.125 MG) BY MOUTH TWICE DAILY Patient taking differently: Take 3.125 mg by mouth 2 (two) times daily with a meal. 12/11/21  Yes Joaquim Nam, MD  cholecalciferol (VITAMIN D3) 25 MCG (1000 UNIT) tablet Take 2,000 Units by mouth daily.    Yes [provider]  Coenzyme Q10 100 MG capsule Take 100 mg by mouth daily.   Yes [provider]  ezetimibe (ZETIA) 10 MG tablet Take 1 tablet (10 mg total) by mouth daily. 04/11/22 04/06/23 Yes End, Cristal Deer, MD  fluticasone (FLONASE) 50 MCG/ACT nasal spray Place 2 sprays into both nostrils daily. 05/16/22  Yes Doreene Nest, NP  Garlic 500 MG TABS Take 500 mg by mouth daily.   Yes [provider]  icosapent Ethyl (VASCEPA) 1 g capsule Take 2  capsules (2 g total) by mouth 2 (two) times daily. 06/29/21  Yes End,  Cristal Deer, MD  isosorbide mononitrate (IMDUR) 120 MG 24 hr tablet TAKE 1 TABLET(120 MG) BY MOUTH DAILY Patient taking differently: Take 120 mg by mouth daily. 06/08/22  Yes End, Cristal Deer, MD  losartan (COZAAR) 50 MG tablet Take 1 tablet (50 mg total) by mouth daily. 01/18/22 01/13/23 Yes End, Cristal Deer, MD  meclizine (ANTIVERT) 25 MG tablet Take 0.5-1 tablet (12.5 mg- 25 mg) by mouth every 6 hours as needed for dizziness 11/02/21  Yes End, Cristal Deer, MD  nitroGLYCERIN (NITROSTAT) 0.4 MG SL tablet Place 1 tablet (0.4 mg total) under the tongue every 5 (five) minutes as needed for chest pain. Maximum of 3 doses. 08/29/21  Yes End, Cristal Deer, MD  omeprazole (PRILOSEC) 20 MG capsule Take 20 mg by mouth daily as needed.   Yes [provider]  famotidine (PEPCID) 20 MG tablet Take 1 tablet (20 mg total) by mouth 2 (two) times daily as needed for heartburn or indigestion. Patient not taking: Reported on 07/25/2022 04/29/18   Joaquim Nam, MD  tiZANidine (ZANAFLEX) 4 MG tablet Take 0.5-1 tablets (2-4 mg total) by mouth every 8 (eight) hours as needed for muscle spasms (sedation caution). Patient not taking: Reported on 07/25/2022 06/06/21   Joaquim Nam, MD     Critical care time: 54 minutes       Betsey Holiday, AGACNP-BC Acute Care Nurse Practitioner [facility]   [phone] / [phone] Please see Amion for details.

## 2022-07-25 NOTE — Progress Notes (Signed)
eLink Physician-Brief Progress Note Patient Name: Billy Kim DOB: [birth year] MRN: [id number]   Date of Service  07/25/2022  HPI/Events of Note  Patient admitted with altered mental status and found to have a left thalamic hypertensive intracranial hemorrhage, he is currently on a Cleviprex gtt and neurology is following.  eICU Interventions  New Patient Evaluation.        [name] [name] 07/25/2022, 8:51 PM

## 2022-07-25 NOTE — Consult Note (Signed)
NEUROLOGY TELECONSULTATION NOTE   Date of service: July 25, 2022 Patient Name: Billy Kim MRN:  [id number] DOB:  [birth year] Reason for consult: stroke code  Requesting Provider: Dr. Trinna Post Consult Participants: myself, patient, bedside RN, telestroke RN, patient's wife Location of the provider: [facility], [place] Location of the patient: [facility]  This consult was provided via telemedicine with 2-way video and audio communication. The patient/family was informed that care would be provided in this way and agreed to receive care in this manner.   _ _ _   _ __   _ __ _ _  __ __   _ __   __ _  History of Present Illness   This is a 77 yo man with hx CAD, HL, HTN, UC who presents with word finding difficulty and balance issues since 1700 LKW. His speech has improved and he has no weakness other than a slight R facial droop. No sensory deficits. Head CT showed 1.24mL left thalamic hemorrhage. No history of trauma. Patient is on daily aspirin but no anticoagulation. BP 184/98 on arrival.   ROS   Per HPI; all other systems reviewed and are negative  Past History   The following was personally reviewed:  Past Medical History:  Diagnosis Date   Coronary artery disease 01/2020   Moderate proximal LAD disease (not hemodynamically significant) and CTO's of D1 and non-dominant RCA   HLD (hyperlipidemia)    Hypertension    Ulcerative colitis 5/81   Remission for years   Past Surgical History:  Procedure Laterality Date   CARDIAC CATHETERIZATION  03/20/01   Cardiolite EF 55% 02/10/02   CHOLECYSTECTOMY N/A 07/09/2017   Procedure: LAPAROSCOPIC CHOLECYSTECTOMY;  Surgeon: Jimmye Norman, MD;  Location: [facility] OR;  Service: General;  Laterality: N/A;   COLONOSCOPY  multiple   ENDOSCOPIC RETROGRADE CHOLANGIOPANCREATOGRAPHY (ERCP) WITH PROPOFOL N/A 07/08/2017   Procedure: ENDOSCOPIC RETROGRADE CHOLANGIOPANCREATOGRAPHY (ERCP) WITH PROPOFOL;  Surgeon: Lynann Bologna, MD;  Location: [facility] ENDOSCOPY;  Service:  Endoscopy;  Laterality: N/A;   INGUINAL HERNIA REPAIR  04/09/06   Bilateral   LAPAROSCOPIC APPENDECTOMY  03/1981   LEFT HEART CATH AND CORONARY ANGIOGRAPHY Left 12/11/2019   Procedure: LEFT HEART CATH AND CORONARY ANGIOGRAPHY;  Surgeon: Yvonne Kendall, MD;  Location: [facility] INVASIVE CV LAB;  Service: Cardiovascular;  Laterality: Left;   REMOVAL OF STONES  07/08/2017   Procedure: REMOVAL OF STONES;  Surgeon: Lynann Bologna, MD;  Location: [facility] ENDOSCOPY;  Service: Endoscopy;;   SPHINCTEROTOMY  07/08/2017   Procedure: Dennison Mascot;  Surgeon: Lynann Bologna, MD;  Location: [facility] ENDOSCOPY;  Service: Endoscopy;;   Family History  Problem Relation Age of Onset   Cancer Mother        ? GYN, died after hysterectomy   Stroke Father    CAD Father    Hypertension Sister    Prostate cancer Brother    CAD Brother 92       CABG   Bladder Cancer Brother 44       Bladder   Hypertension Brother    Hypertension Brother    Hypertension Brother    Hypertension Brother    Hypertension Brother    Bone cancer Paternal Grandfather    Hypertension Son    Prostate cancer Nephew    Bladder Cancer Nephew    Colon cancer Neg Hx    Esophageal cancer Neg Hx    Rectal cancer Neg Hx    Stomach cancer Neg Hx    Social History   Socioeconomic History  Marital status: Married    Spouse name: Not on file   Number of children: 1   Years of education: Not on file   Highest education level: Not on file  Occupational History   Occupation: OWNER    Employer: SELF    Comment: [employer]  Tobacco Use   Smoking status: Never   Smokeless tobacco: Never  Vaping Use   Vaping Use: Never used  Substance and Sexual Activity   Alcohol use: No   Drug use: No   Sexual activity: Never  Other Topics Concern   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   Social Determinants of Health   Financial Resource  Strain: Low Risk  (08/04/2020)   Overall Financial Resource Strain (CARDIA)    Difficulty of Paying Living Expenses: Not hard at all  Food Insecurity: No Food Insecurity (08/04/2020)   Hunger Vital Sign    Worried About Running Out of Food in the Last Year: Never true    Ran Out of Food in the Last Year: Never true  Transportation Needs: No Transportation Needs (08/04/2020)   PRAPARE - Administrator, Civil Service (Medical): No    Lack of Transportation (Non-Medical): No  Physical Activity: Inactive (08/04/2020)   Exercise Vital Sign    Days of Exercise per Week: 0 days    Minutes of Exercise per Session: 0 min  Stress: No Stress Concern Present (08/04/2020)   [employer] of Occupational Health - Occupational Stress Questionnaire    Feeling of Stress : Not at all  Social Connections: Not on file   Allergies  Allergen Reactions   Amoxicillin Other (See Comments)    Caused headache Has patient had a PCN reaction causing immediate rash, facial/tongue/throat swelling, SOB or lightheadedness with hypotension: No Has patient had a PCN reaction causing severe rash involving mucus membranes or skin necrosis: No Has patient had a PCN reaction that required hospitalization: No Has patient had a PCN reaction occurring within the last 10 years: No If all of the above answers are "NO", then may proceed with Cephalosporin use.   Lyrica [Pregabalin]     Intolerant, worsening headache   Morphine Sulfate Other (See Comments)    headache   Neurontin [Gabapentin] Other (See Comments)    headache   Sulfonamide Derivatives Other (See Comments)    headache    Medications   (Not in a hospital admission)     Current Facility-Administered Medications:    clevidipine (CLEVIPREX) infusion 0.5 mg/mL, 0-21 mg/hr, Intravenous, Continuous, Selina Cooley, Malachi Carl, MD   sodium chloride flush (NS) 0.9 % injection 3 mL, 3 mL, Intravenous, Once, Ray, Neha, MD  Current Outpatient Medications:     Ascorbic Acid (VITAMIN C) 1000 MG tablet, Take 1,000 mg by mouth daily., Disp: , Rfl:    aspirin EC 81 MG tablet, Take 1 tablet (81 mg total) by mouth daily. Swallow whole., Disp: 90 tablet, Rfl: 3   benzonatate (TESSALON) 200 MG capsule, Take 1 capsule (200 mg total) by mouth 3 (three) times daily as needed for cough., Disp: 15 capsule, Rfl: 0   carvedilol (COREG) 3.125 MG tablet, TAKE 1 TABLET(3.125 MG) BY MOUTH TWICE DAILY, Disp: 180 tablet, Rfl: 3   cholecalciferol (VITAMIN D3) 25 MCG (1000 UNIT) tablet, Take 2,000 Units by mouth daily. , Disp: , Rfl:    Coenzyme Q10 100 MG capsule,  Take 100 mg by mouth daily., Disp: , Rfl:    ezetimibe (ZETIA) 10 MG tablet, Take 1 tablet (10 mg total) by mouth daily., Disp: 90 tablet, Rfl: 3   famotidine (PEPCID) 20 MG tablet, Take 1 tablet (20 mg total) by mouth 2 (two) times daily as needed for heartburn or indigestion., Disp: , Rfl:    fluticasone (FLONASE) 50 MCG/ACT nasal spray, Place 2 sprays into both nostrils daily., Disp: 16 g, Rfl: 0   Garlic 500 MG TABS, Take 500 mg by mouth daily., Disp: , Rfl:    icosapent Ethyl (VASCEPA) 1 g capsule, Take 2 capsules (2 g total) by mouth 2 (two) times daily., Disp: 120 capsule, Rfl: 3   isosorbide mononitrate (IMDUR) 120 MG 24 hr tablet, TAKE 1 TABLET(120 MG) BY MOUTH DAILY, Disp: 90 tablet, Rfl: 0   losartan (COZAAR) 50 MG tablet, Take 1 tablet (50 mg total) by mouth daily., Disp: 90 tablet, Rfl: 3   meclizine (ANTIVERT) 25 MG tablet, Take 0.5-1 tablet (12.5 mg- 25 mg) by mouth every 6 hours as needed for dizziness, Disp: 25 tablet, Rfl: 0   nitroGLYCERIN (NITROSTAT) 0.4 MG SL tablet, Place 1 tablet (0.4 mg total) under the tongue every 5 (five) minutes as needed for chest pain. Maximum of 3 doses., Disp: 25 tablet, Rfl: 1   omeprazole (PRILOSEC) 20 MG capsule, Take 20 mg by mouth daily as needed., Disp: , Rfl:    tiZANidine (ZANAFLEX) 4 MG tablet, Take 0.5-1 tablets (2-4 mg total) by mouth every 8 (eight) hours as  needed for muscle spasms (sedation caution)., Disp: 30 tablet, Rfl: 0  Vitals   Vitals:   07/25/22 1848 07/25/22 1900 07/25/22 1905 07/25/22 1907  BP: (!) 173/105 (!) 184/98 (!) 182/87 (!) 178/91  Pulse: 60  61 (!) 59  Resp: 20 12 20  (!) 22  Temp: 98.5 F (36.9 C)     SpO2: 95%  98% 98%     There is no height or weight on file to calculate BMI.  Physical Exam   Exam performed over telemedicine with 2-way video and audio communication and with assistance of bedside RN  Physical Exam Gen: A&O x4, NAD Resp: normal WOB CV: extremities appear well-perfused  Neuro: *MS: A&O x4. Follows multi-step commands.  *Speech: minimal dysarthria, no aphasia, able to name and repeat *CN: PERRL 3mm, EOMI, VFF by confrontation, sensation intact, R UMN facial droop, hearing intact to voice *Motor:   Normal bulk.  No tremor, rigidity or bradykinesia. No pronator drift. All extremities appear full-strength and symmetric. *Sensory: SILT. Symmetric. No double-simultaneous extinction.  *Coordination:  Finger-to-nose, heel-to-shin, rapid alternating motions were intact. *Reflexes:  UTA 2/2 tele-exam *Gait: deferred  NIHSS = 2 for facial droop and dysarthria  mRS = 0  ICH score = 0   Labs   CBC:  Recent Labs  Lab 07/20/22 0924 07/25/22 1849  WBC 5.9 5.6  NEUTROABS  --  3.6  HGB 14.7 14.8  HCT 41.3 42.3  MCV 84.5 86.7  PLT 125* 125*    Basic Metabolic Panel:  Lab Results  Component Value Date   NA 140 07/20/2022   K 3.8 07/20/2022   CO2 26 07/20/2022   GLUCOSE 99 07/20/2022   BUN 23 07/20/2022   CREATININE 1.38 (H) 07/20/2022   CALCIUM 8.5 (L) 07/20/2022   GFRNONAA 52 (L) 07/20/2022   GFRAA 72 12/08/2019   Lipid Panel:  Lab Results  Component Value Date   LDLCALC 48 10/06/2021   HgbA1c:  No results found for: "HGBA1C" Urine Drug Screen: No results found for: "LABOPIA", "COCAINSCRNUR", "LABBENZ", "AMPHETMU", "THCU", "LABBARB"  Alcohol Level No results found for:  "ETH"  CT Head without contrast: 1.47ml L thalamic IPH, personal review  Impression   This is a 77 yo man with hx CAD, HL, HTN, UC who presents with word finding difficulty and balance issues and was found to have a small L thalamic hemorrhage, likely hypertensive in etiology. He is not on anticoagulation.  Recommendations   - Admit to ICU  - Clevidipine for goal SBP <150 - SCDs for DVT prophylaxis - CTA head r/o underlying vascular malformation (though etiology favored to be hypertensive) - Head CT in 6 hrs assess stability of ICH - MRI brain wwo tomorrow - HOB elevated 30 degrees - q 1 hr neurochecks and vitals - ED to touch base with Dr. Myer Haff and let him know about the patient. In my opinion he is appropriate to admit to ICU at [facility]. There is no indication for surgical intervention at this time. - I will follow up with patient in person in the AM   This patient is critically ill and at significant risk of neurological worsening, death and care requires constant monitoring of vital signs, hemodynamics,respiratory and cardiac monitoring, neurological assessment, discussion with family, other specialists and medical decision making of high complexity. I spent 45 minutes of neurocritical care time  in the care of  this patient. This was time spent independent of any time provided by nurse practitioner or PA.   Bing Neighbors, MD [facility] [phone]   If 7pm- 7am, please page neurology on call as listed in AMION.

## 2022-07-25 NOTE — ED Provider Notes (Signed)
[facility] Provider Note    Event Date/Time   First MD Initiated Contact with Patient 07/25/22 1858     (approximate)   History   Code Stroke   HPI  Billy Kim is a 77 y.o. male with history of hypertension, CAD, ulcerative colitis presenting to the emergency department for evaluation as a code stroke.  Accompanied by wife who provides collateral history.  Patient went to take a shower around 5 PM.  She did see him prior to this and he was acting at his baseline.  After he got out of the shower, he was noted to have a shuffling gait with word finding difficulty and dysarthria.  Presented via private vehicle for further evaluation.     Physical Exam   Triage Vital Signs: ED Triage Vitals [07/25/22 1848]  Enc Vitals Group     BP (!) 173/105     Pulse Rate 60     Resp 20     Temp 98.5 F (36.9 C)     Temp src      SpO2 95 %     Weight      Height      Head Circumference      Peak Flow      Pain Score      Pain Loc      Pain Edu?      Excl. in GC?     Most recent vital signs: Vitals:   07/25/22 2330 07/25/22 2345  BP: (!) 140/90 (!) 166/87  Pulse: 67 68  Resp: 16 14  Temp:    SpO2: 100% 98%     General: Awake, interactive  CV:  Regular rate, good peripheral perfusion.  Resp:  Lungs clear, unlabored respirations.  Abd:  Soft, nondistended.  Neuro:  Keenly aware, correctly answers basic questions, but does have some delay, pupils equal and reactive, 5-5 strength in bilateral upper and lower extremities.  Sensation intact.  Normal finger-to-nose testing.  ED Results / Procedures / Treatments   Labs (all labs ordered are listed, but only abnormal results are displayed) Labs Reviewed  PROTIME-INR - Abnormal; Notable for the following components:      Result Value   Prothrombin Time 15.3 (*)    All other components within normal limits  CBC - Abnormal; Notable for the following components:   Platelets 125 (*)    All other  components within normal limits  COMPREHENSIVE METABOLIC PANEL - Abnormal; Notable for the following components:   Glucose, Bld 107 (*)    BUN 26 (*)    Creatinine, Ser 1.42 (*)    Calcium 8.8 (*)    Total Bilirubin 2.4 (*)    GFR, Estimated 51 (*)    All other components within normal limits  GLUCOSE, CAPILLARY - Abnormal; Notable for the following components:   Glucose-Capillary 116 (*)    All other components within normal limits  CBG MONITORING, ED - Abnormal; Notable for the following components:   Glucose-Capillary 106 (*)    All other components within normal limits  MRSA NEXT GEN BY PCR, NASAL  APTT  DIFFERENTIAL  ETHANOL  CBC  BASIC METABOLIC PANEL  MAGNESIUM  PHOSPHORUS  CBG MONITORING, ED     EKG EKG independently reviewed interpreted by myself (ER attending) demonstrates:    RADIOLOGY Imaging independently reviewed and interpreted by myself demonstrates:  CT head demonstrates a left intraparenchymal hemorrhage without appreciable mass effect  PROCEDURES:  Critical Care performed: Yes, see  critical care procedure note(s)  CRITICAL CARE Performed by: Trinna Post   Total critical care time: 32 minutes  Critical care time was exclusive of separately billable procedures and treating other patients.  Critical care was necessary to treat or prevent imminent or life-threatening deterioration.  Critical care was time spent personally by me on the following activities: development of treatment plan with patient and/or surrogate as well as nursing, discussions with consultants, evaluation of patient's response to treatment, examination of patient, obtaining history from patient or surrogate, ordering and performing treatments and interventions, ordering and review of laboratory studies, ordering and review of radiographic studies, pulse oximetry and re-evaluation of patient's condition.   Procedures   MEDICATIONS ORDERED IN ED: Medications  clevidipine  (CLEVIPREX) infusion 0.5 mg/mL (6 mg/hr Intravenous New Bag/Given 07/25/22 2137)   stroke: early stages of recovery book (has no administration in time range)  acetaminophen (TYLENOL) tablet 650 mg (has no administration in time range)    Or  acetaminophen (TYLENOL) 160 MG/5ML solution 650 mg (has no administration in time range)    Or  acetaminophen (TYLENOL) suppository 650 mg (has no administration in time range)  senna-docusate (Senokot-S) tablet 1 tablet (1 tablet Oral Not Given 07/25/22 2211)  pantoprazole (PROTONIX) injection 40 mg (40 mg Intravenous Given 07/25/22 2234)  docusate sodium (COLACE) capsule 100 mg (has no administration in time range)  polyethylene glycol (MIRALAX / GLYCOLAX) packet 17 g (has no administration in time range)  Chlorhexidine Gluconate Cloth 2 % PADS 6 each (6 each Topical Given 07/25/22 2044)  sodium chloride flush (NS) 0.9 % injection 3 mL (3 mLs Intravenous Given 07/25/22 2234)  labetalol (NORMODYNE) injection 10 mg (10 mg Intravenous Given 07/25/22 1907)  clevidipine (CLEVIPREX) 0.5 MG/ML infusion (  Duplicate 07/25/22 2039)  iohexol (OMNIPAQUE) 350 MG/ML injection 75 mL (75 mLs Intravenous Contrast Given 07/25/22 1946)     IMPRESSION / MDM / ASSESSMENT AND PLAN / ED COURSE  I reviewed the triage vital signs and the nursing notes.  Differential diagnosis includes, but is not limited to, acute CVA, TIA, intracranial bleed, other acute intracranial process  Patient's presentation is most consistent with acute presentation with potential threat to life or bodily function.  77 year old male presenting with acute onset of speech and gait problems.  Speech improved here.  Evaluated as a code stroke.  This did demonstrate a left-sided intraparenchymal hemorrhage.  Does present hypertensive on presentation.  Reviewed case with Dr. Selina Cooley via video screen.  Based on exam and type of bleed, she does feel that the patient can be admitted to our hospital.  He has been placed on a  clevidipine drip for blood pressure control with improvement in his blood pressure.  She does recommend repeat head CT in 6 hours as well as a CTA of the head.  She did feel that the patient could be admitted at [facility], but did recommend that I notify the neurosurgery team.  Dr. Katrinka Blazing was contacted.  He is agreeable with plan for admission here and plans to come in to alleyway the patient.  Will reach to ICU to discuss admission.  Case discussed with ICU team.  They will evaluate the patient for anticipated admission.       FINAL CLINICAL IMPRESSION(S) / ED DIAGNOSES   Final diagnoses:  Thalamic hemorrhage (HCC)     Rx / DC Orders   ED Discharge Orders     None        Note:  This document was  prepared using Conservation officer, historic buildings and may include unintentional dictation errors.   Trinna Post, MD 07/26/22 [phone]

## 2022-07-25 NOTE — ED Triage Notes (Signed)
Pt to ED with wife for altered balance, difficulty with speech started 1700.  Pt delayed in answering questions. Equal grip and strength, face symmetrical

## 2022-07-25 NOTE — ED Notes (Signed)
Pt to CT

## 2022-07-25 NOTE — Progress Notes (Signed)
   07/25/22 1900  Spiritual Encounters  Type of Visit Initial  Care provided to: Pt and family  Reason for visit Code  OnCall Visit Yes  Interventions  Spiritual Care Interventions Made Established relationship of care and support  Spiritual Care Plan  Spiritual Care Issues Still Outstanding No further spiritual care needs at this time (see row info)   Chaplain showed compassionate presence. Available for further assistance if need arises.

## 2022-07-26 ENCOUNTER — Inpatient Hospital Stay: Payer: PPO

## 2022-07-26 ENCOUNTER — Other Ambulatory Visit: Payer: Self-pay | Admitting: Cardiology

## 2022-07-26 ENCOUNTER — Inpatient Hospital Stay (HOSPITAL_COMMUNITY)
Admit: 2022-07-26 | Discharge: 2022-07-26 | Disposition: A | Discharge status: Home / Self Care | Payer: PPO | Attending: Neurology | Admitting: Neurology

## 2022-07-26 DIAGNOSIS — I6389 Other cerebral infarction: Secondary | ICD-10-CM | POA: Diagnosis not present

## 2022-07-26 DIAGNOSIS — I610 Nontraumatic intracerebral hemorrhage in hemisphere, subcortical: Secondary | ICD-10-CM | POA: Diagnosis not present

## 2022-07-26 LAB — ECHOCARDIOGRAM COMPLETE
AV Mean grad: 2 mmHg
AV Peak grad: 3.6 mmHg
Ao pk vel: 0.95 m/s
Area-P 1/2: 2.36 cm2
Height: 68 in
S' Lateral: 2.7 cm
Weight: 3298.08 oz

## 2022-07-26 LAB — PHOSPHORUS: Phosphorus: 2.9 mg/dL (ref 2.5–4.6)

## 2022-07-26 LAB — CBC
HCT: 48.8 % (ref 39.0–52.0)
Hemoglobin: 17.4 g/dL — ABNORMAL HIGH (ref 13.0–17.0)
MCH: 30.2 pg (ref 26.0–34.0)
MCHC: 35.7 g/dL (ref 30.0–36.0)
MCV: 84.6 fL (ref 80.0–100.0)
Platelets: 117 10*3/uL — ABNORMAL LOW (ref 150–400)
RBC: 5.77 MIL/uL (ref 4.22–5.81)
RDW: 13.2 % (ref 11.5–15.5)
WBC: 6.9 10*3/uL (ref 4.0–10.5)
nRBC: 0 % (ref 0.0–0.2)

## 2022-07-26 LAB — PROTIME-INR
INR: 1.1 (ref 0.8–1.2)
Prothrombin Time: 14.9 seconds (ref 11.4–15.2)

## 2022-07-26 LAB — MAGNESIUM: Magnesium: 2.4 mg/dL (ref 1.7–2.4)

## 2022-07-26 LAB — GLUCOSE, CAPILLARY
Glucose-Capillary: 110 mg/dL — ABNORMAL HIGH (ref 70–99)
Glucose-Capillary: 111 mg/dL — ABNORMAL HIGH (ref 70–99)
Glucose-Capillary: 111 mg/dL — ABNORMAL HIGH (ref 70–99)
Glucose-Capillary: 117 mg/dL — ABNORMAL HIGH (ref 70–99)
Glucose-Capillary: 127 mg/dL — ABNORMAL HIGH (ref 70–99)
Glucose-Capillary: 130 mg/dL — ABNORMAL HIGH (ref 70–99)
Glucose-Capillary: 131 mg/dL — ABNORMAL HIGH (ref 70–99)

## 2022-07-26 LAB — LIPID PANEL
Cholesterol: 148 mg/dL (ref 0–200)
HDL: 29 mg/dL — ABNORMAL LOW (ref >40)
LDL Cholesterol: 41 mg/dL (ref 0–99)
Total CHOL/HDL Ratio: 5.1 RATIO
Triglycerides: 388 mg/dL — ABNORMAL HIGH (ref <150)
VLDL: 78 mg/dL — ABNORMAL HIGH (ref 0–40)

## 2022-07-26 LAB — BASIC METABOLIC PANEL
Anion gap: 9 (ref 5–15)
BUN: 22 mg/dL (ref 8–23)
CO2: 24 mmol/L (ref 22–32)
Calcium: 8.8 mg/dL — ABNORMAL LOW (ref 8.9–10.3)
Chloride: 107 mmol/L (ref 98–111)
Creatinine, Ser: 1.15 mg/dL (ref 0.61–1.24)
GFR, Estimated: >60 mL/min (ref >60)
Glucose, Bld: 110 mg/dL — ABNORMAL HIGH (ref 70–99)
Potassium: 4.4 mmol/L (ref 3.5–5.1)
Sodium: 140 mmol/L (ref 135–145)

## 2022-07-26 MED ORDER — CARVEDILOL 3.125 MG PO TABS
3.1250 mg | ORAL_TABLET | Freq: Two times a day (BID) | ORAL | Status: DC
  Administered 2022-07-26 – 2022-07-31 (×10): 3.125 mg via ORAL
  Filled 2022-07-26 (×10): qty 1

## 2022-07-26 MED ORDER — LACTATED RINGERS IV BOLUS
1000.0000 mL | Freq: Once | INTRAVENOUS | Status: AC
  Administered 2022-07-26: 1000 mL via INTRAVENOUS

## 2022-07-26 MED ORDER — EZETIMIBE 10 MG PO TABS
10.0000 mg | ORAL_TABLET | Freq: Every day | ORAL | Status: DC
  Administered 2022-07-26 – 2022-07-31 (×6): 10 mg via ORAL
  Filled 2022-07-26 (×6): qty 1

## 2022-07-26 MED ORDER — GADOBUTROL 1 MMOL/ML IV SOLN
8.0000 mL | Freq: Once | INTRAVENOUS | Status: AC | PRN
  Administered 2022-07-26: 8 mL via INTRAVENOUS

## 2022-07-26 MED ORDER — ISOSORBIDE MONONITRATE ER 30 MG PO TB24
120.0000 mg | ORAL_TABLET | Freq: Every day | ORAL | Status: DC
  Administered 2022-07-27 – 2022-07-28 (×2): 120 mg via ORAL
  Filled 2022-07-26 (×2): qty 4

## 2022-07-26 MED ORDER — ICOSAPENT ETHYL 1 G PO CAPS
2.0000 g | ORAL_CAPSULE | Freq: Two times a day (BID) | ORAL | Status: DC
  Administered 2022-07-27 – 2022-07-31 (×9): 2 g via ORAL
  Filled 2022-07-26 (×9): qty 2

## 2022-07-26 MED ORDER — LOSARTAN POTASSIUM 50 MG PO TABS
50.0000 mg | ORAL_TABLET | Freq: Every day | ORAL | Status: DC
  Administered 2022-07-26 – 2022-07-31 (×5): 50 mg via ORAL
  Filled 2022-07-26 (×6): qty 1

## 2022-07-26 NOTE — Evaluation (Signed)
Clinical/Bedside Swallow Evaluation Patient Details  Name: Billy Kim MRN: [id number] Date of Birth: 1945-07-06  Today's Date: 07/26/2022 Time: SLP Start Time (ACUTE ONLY): 1038 SLP Stop Time (ACUTE ONLY): 1053 SLP Time Calculation (min) (ACUTE ONLY): 15 min  Past Medical History:  Past Medical History:  Diagnosis Date   Coronary artery disease 01/2020   Moderate proximal LAD disease (not hemodynamically significant) and CTO's of D1 and non-dominant RCA   HLD (hyperlipidemia)    Hypertension    Ulcerative colitis 5/81   Remission for years   Past Surgical History:  Past Surgical History:  Procedure Laterality Date   CARDIAC CATHETERIZATION  03/20/01   Cardiolite EF 55% 02/10/02   CHOLECYSTECTOMY N/A 07/09/2017   Procedure: LAPAROSCOPIC CHOLECYSTECTOMY;  Surgeon: Jimmye Norman, MD;  Location: [facility] OR;  Service: General;  Laterality: N/A;   COLONOSCOPY  multiple   ENDOSCOPIC RETROGRADE CHOLANGIOPANCREATOGRAPHY (ERCP) WITH PROPOFOL N/A 07/08/2017   Procedure: ENDOSCOPIC RETROGRADE CHOLANGIOPANCREATOGRAPHY (ERCP) WITH PROPOFOL;  Surgeon: Lynann Bologna, MD;  Location: [facility] ENDOSCOPY;  Service: Endoscopy;  Laterality: N/A;   INGUINAL HERNIA REPAIR  04/09/06   Bilateral   LAPAROSCOPIC APPENDECTOMY  03/1981   LEFT HEART CATH AND CORONARY ANGIOGRAPHY Left 12/11/2019   Procedure: LEFT HEART CATH AND CORONARY ANGIOGRAPHY;  Surgeon: Yvonne Kendall, MD;  Location: [facility] INVASIVE CV LAB;  Service: Cardiovascular;  Laterality: Left;   REMOVAL OF STONES  07/08/2017   Procedure: REMOVAL OF STONES;  Surgeon: Lynann Bologna, MD;  Location: [facility] ENDOSCOPY;  Service: Endoscopy;;   SPHINCTEROTOMY  07/08/2017   Procedure: Dennison Mascot;  Surgeon: Lynann Bologna, MD;  Location: [facility] ENDOSCOPY;  Service: Endoscopy;;   HPI:  Pt is 77 year old male presenting to [facility] ED from home on 07/25/2022 for evaluation as a code stroke. Pt with PMHx HLD, HTN, and CAD. MRI, 6/6, "1. No mass or other unexpected finding underlying the  patient's left  thalamic hematoma.  2. Chronic small vessel ischemia with chronic microhemorrhages."    Assessment / Plan / Recommendation  Clinical Impression  Pt seen for clinical swallowing evaluation. Pt alert, pleasant, and cooperative. Aphasia noted. Pt denies difficutly swallowing PTA. Pt given trials of thin liquids, puree, and solids. Pt demonstrated a functional oral swallow. Pharyngeal swallow appeared [facility] per clinical assessment. Recommend initiation of a regular diet with thin liquids with safe swallowing strategies/aspiration precautions/reflux precautions as outlined below. Noted pt with hx of hiatal hernia and stenosis of the GE junction per upper endoscopy in 2022. SLP to proceed with speech/language evaluation given noted communication deficits. No f/u warranted for swallowing.      Aspiration Risk       Diet Recommendation Regular;Thin liquid   Liquid Administration via: Spoon;Straw;Cup Medication Administration:  (as tolerated) Supervision: Patient able to self feed;Staff to assist with self feeding Compensations: Slow rate;Small sips/bites (reflux precautions) Postural Changes: Seated upright at 90 degrees    Other  Recommendations Oral Care Recommendations: Oral care BID    Recommendations for follow up therapy are one component of a multi-disciplinary discharge planning process, led by the attending physician.  Recommendations may be updated based on patient status, additional functional criteria and insurance authorization.  Follow up Recommendations No SLP follow up (for swallowing)         Functional Status Assessment Patient has not had a recent decline in their functional status (for swallowing)         Prognosis Prognosis for improved oropharyngeal function: Good      Swallow Study   General Date of  Onset: 07/25/22 HPI: Pt is 77 year old male presenting to [facility] ED from home on 07/25/2022 for evaluation as a code stroke. Pt with PMHx HLD, HTN, and CAD.  MRI, 6/6, "1. No mass or other unexpected finding underlying the patient's left  thalamic hematoma.  2. Chronic small vessel ischemia with chronic microhemorrhages." Type of Study: Bedside Swallow Evaluation Previous Swallow Assessment: hx of upper endoscopy most recent 03/25/20 - noted stenosis of GI junction and hiatal hernia Diet Prior to this Study: NPO Temperature Spikes Noted: No Respiratory Status: Room air History of Recent Intubation: No Behavior/Cognition: Alert;Cooperative;Pleasant mood Oral Cavity Assessment: Within Functional Limits Oral Care Completed by SLP: Recent completion by staff Oral Cavity - Dentition: Dentures, top;Dentures, bottom Vision: Functional for self-feeding Self-Feeding Abilities: Able to feed self Patient Positioning: Upright in bed Baseline Vocal Quality: Normal Volitional Cough: Strong Volitional Swallow: Able to elicit    Oral/Motor/Sensory Function Overall Oral Motor/Sensory Function: Mild impairment Facial ROM: Within Functional Limits Lingual ROM: Reduced right;Suspected CN XII (hypoglossal) dysfunction Lingual Strength: Reduced;Suspected CN XII (hypoglossal) dysfunction   Ice Chips Ice chips: Not tested   Thin Liquid Thin Liquid: Within functional limits Presentation: Cup;Self Fed;Straw    Nectar Thick Nectar Thick Liquid: Not tested   Honey Thick Honey Thick Liquid: Not tested   Puree Puree: Within functional limits Presentation: Self Fed   Solid     Solid: Within functional limits Presentation: Self Fed     Clyde Canterbury, M.S., CCC-SLP Speech-Language Pathologist [facility] [phone] (ASCOM)   Alessandra Bevels Aaryn Parrilla 07/26/2022,1:30 PM

## 2022-07-26 NOTE — Progress Notes (Signed)
Repeat head CT discontinued since MRI is available this morning prior to day shift.

## 2022-07-26 NOTE — Progress Notes (Signed)
NAME:  Billy Kim, MRN:  [id number], DOB:  [birth year], LOS: 1 ADMISSION DATE:  07/25/2022, CONSULTATION DATE:  07/25/22 REFERRING MD:  Dr. Rosalia Hammers, CHIEF COMPLAINT:  CODE STROKE   History of Present Illness:  77 year old male presenting to [facility] ED from home on 07/25/2022 for evaluation as a code stroke.  History provided per bedside patient report, correlating spouse report bedside and chart review. Patient was in his normal state of health when he took a shower this evening around 16:50.  Upon getting out of the shower around 17:00 his wife noticed he seemed " off".  While attempting to get dressed he seemed to have difficulty putting on his socks, also some balance difficulties requiring him to hold onto the bed & when his son called to speak with him he was having difficulty " getting the words out".  She also thinks that she noticed some slight slurred speech.  The patient describes not feeling like himself and knowing something was wrong.  He denied headache, blurred vision, dizziness, sick symptoms or sick contacts.  He did endorse feeling off balance, having difficulty getting out what he wanted to say and possible weakness in bilateral lower extremities. Of note patient is being followed for hypertension.  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  ED course: Upon arrival patient worked up for code stroke.  Speech was documented to have improved after arrival to ED. initial vitals showed hypertension with BP 184/98. Initial NIH stroke scale documented as a 2 & imaging revealed acute small left thalamic bleed measuring 1.5 cm.  Medications given: Labetalol, IV contrast and clevidipine drip started   CT head without contrast 07/25/2022: Acute intraparenchymal hemorrhage in the left thalamus measuring 1.8 x 1.4 x 1.2 cm (approximately 1.5 mL). No midline shift or other mass effect. CT angio head code stroke  07/25/2022:  Normal CTA of the head.  No aneurysm or vascular malformation. Unchanged appearance of left thalamic intraparenchymal hematoma, most consistent with hypertensive hemorrhage.  PCCM consulted for admission due to acute small left thalamic hemorrhage in the setting of uncontrolled hypertension requiring clevidipine drip.  Pertinent  Medical History  CAD (with chronic total occlusions of D1 & non-dominant RCA as well as moderate proximal LAD dx- medical management) Ulcerative Colitis (remission for years) HTN HLD (hypertriglyceridemia)  Significant Hospital Events: Including procedures, antibiotic start and stop dates in addition to other pertinent events   07/25/22: Admit to ICU with acute small left thalamic hemorrhage in the setting of uncontrolled hypertension requiring clevidipine drip. 6/6 repeat CT head shows worsening thalamic hemorrhage, remains on clevidipine   Interim History / Subjective:  Patient alert and responsive current NIHSS 1 for mild aphasia. Spouse and son bedside plan of care discussed and all questions and concerns answered at this time  Objective   Blood pressure (!) 163/87, pulse (!) 59, temperature 97.8 F (36.6 C), temperature source Oral, resp. rate 13, height 5\' 8"  (1.727 m), weight 93.5 kg, SpO2 98 %.        Intake/Output Summary (Last 24 hours) at 07/26/2022 0717 Last data filed at 07/26/2022 0600 Gross per 24 hour  Intake 722.13 ml  Output 3425 ml  Net -2702.87 ml   Filed Weights   07/26/22 0500  Weight: 93.5 kg    Examination: General: Adult male, critically ill, lying in bed, NAD HEENT: MM pink/moist, anicteric, atraumatic, neck  supple Neuro: A&O x 4, able to follow commands, PERRL +3, MAE CN: II - Visual field intact OU III, IV, VI - Extraocular movements intact. V - Facial sensation intact bilaterally. VII - Facial movement intact bilaterally VIII - Hearing & vestibular intact bilaterally X - Palate elevates symmetrically XI -  Chin turning & shoulder shrug intact bilaterally. XII - Tongue protrusion intact Motor Strength - strength 5/5 all extremities and pronator drift was absent.  Sensory - Light touch was assessed and was symmetrical Coordination - The patient had normal movements in the hands and feet with no ataxia or dysmetria.  Tremor was absent Gait and Station - deferred.  NIHSS Level of  Consciousness: 0 = Alert, keenly responsive; 1 = Not alert, but arousable by minor stimulation to obey, answer, or respond; 2 = Not alert, requires repeated stimulation to attend, or is obtunded and requires strong or painful stimulation to make movements (not stereotyped); 3 = Responds only with reflex motor or autonomic effects or totally unresponsive, flaccid, and areflexic   0  LOC Questions 0 = Answers both questions correctly; 1 = Answers one question correctly; 2 = Answers neither question correctly.  0  LOC Commands 0 = Performs both tasks correctly; 1 = Performs one task correctly; 2 = Performs neither task correctly  0  Best Gaze  0 = Normal; 1 = Partial gaze palsy; gaze is abnormal in one or both eyes, but forced deviation or total gaze paresis is not present; 2 = Forced deviation, or total gaze paresis not overcome by the oculocephalic maneuver   0  Visual 0 = No visual loss; 1 = Partial hemianopia; 2 = Complete hemianopia; 3 = Bilateral hemianopia (blind including cortical blindness).   0  Facial Palsy 0 = Normal symmetrical movements; 1 = Minor paralysis (flattened nasolabial fold, asymmetry on smiling); 2 = Partial paralysis (total or near-total paralysis of lower face); 3 = Complete paralysis of one or both sides (absence of facial movement in the upper and lower face).  0  Motor Arm LEFT 0 = No drift; limb holds 90 (or 45) degrees for full 10 secs; 1 = Drift; limb holds 90 (or 45) degrees, but drifts down before full 10 seconds; does not hit bed or other support; 2 = Some effort against gravity; limb cannot get to  or maintain (if cued) 90 (or 45) degrees, drifts down to bed, but has some effort against gravity; 3 = No effort against gravity; limb falls; 4 = No movement; UN = unable to test (Amputation or joint fusion).   0  Motor Arm RIGHT 0 = No drift; limb holds 90 (or 45) degrees for full 10 secs; 1 = Drift; limb holds 90 (or 45) degrees, but drifts down before full 10 seconds; does not hit bed or other support; 2 = Some effort against gravity; limb cannot get to or maintain (if cued) 90 (or 45) degrees, drifts down to bed, but has some effort against gravity; 3 = No effort against gravity; limb falls; 4 = No movement; UN = unable to test (Amputation or joint fusion).   0  Motor Leg LEFT 0 = No drift; leg holds 30 degree position for full 5 secs; 1 = Drift; leg falls by the end of the 5-sec period but does not hit bed; 2 = Some effort against gravity; leg falls to bed by 5 secs, but has some effort against gravity; 3 = No effort against gravity; leg falls to bed immediately;  4 = No movement; UN = unable to test (Amputation or joint fusion).   0  Motor Leg RIGHT 0 = No drift; leg holds 30 degree position for full 5 secs; 1 = Drift; leg falls by the end of the 5-sec period but does not hit bed; 2 = Some effort against gravity; leg falls to bed by 5 secs, but has some effort against gravity; 3 = No effort against gravity; leg falls to bed immediately; 4 = No movement; UN = unable to test (Amputation or joint fusion).   0  Limb Ataxia 0 = Absent; 1 = Present in one limb; 2 = Present in two limbs; UN = Amputation or joint fusion   0  Sensory  0 = Normal, no sensory loss; 1 = Mild-to-moderate sensory loss, patient feels pinprick is less sharp or is dull on the affected side, or there is a loss of superficial pain with pinprick, but patient is aware of being touched; 2 = Severe to total sensory loss, patient is not aware of being touched in the face, arm, and leg.   0  Best Language 0 = No aphasia; normal; 1 =  Mild-to-moderate aphasia; some obvious loss of fluency or facility of comprehension, w/o significant limitation on ideas expressed or form of expression. Reduction of speech and/or comprehension, however, makes conversation about provided materials difficult or impossible. For example, in conversation about provided materials, examiner can identify picture or naming card content from patient's response; 2 = Severe aphasia; all communication is through fragmentary expression; great need for inference, questioning, and guessing by the listener. Range of information that can be exchanged is limited; listener carries burden of communication. Examiner cannot identify materials provided from patient response; 3 = Mute, global aphasia; no usable speech or auditory comprehension  1  Dysarthria 0 = Normal; 1 = Mild-to-moderate dysarthria, patient slurs at least some words and, at worst, can be understood with some difficulty; 2 = Severe dysarthria, patient's speech is so slurred as to be unintelligible in the absence of or out of proportion to any dysphasia, or is mute/anarthric; UN = Intubated or other physical barrier  0  Extinction/Inattention 0 = No abnormality 1 = Visual, tactile, auditory, spatial, or personal extinction/inattention to bilateral simultaneous stimulation in one of the sensory modalities. 2 = Profound hemi-inattention or extinction to more than one modality; does not recognize own hand or orients to only one side of space.  0  Total   1    Assessment & Plan:  Progressive  left thalamic hemorrhage suspect secondary to hypertension emergency - Goal SBP < 150 using clevidipine drip, wean as tolerated - HOB >= 30 degrees with aspiration precautions - Q1 neuro checks -- get stat head CT and call Neurology if acute change - f/u CT head 1 AM - INR at goal of <1.4, follow-up as needed -Echocardiogram ordered Speech consult, PT/OT when able - MRI follow-up pending - No HepSQ, antiplatelet or  anticoagulant agents at this time, SCDs - Maintain strict euglycemia, euthermia, and euvolemia  - Neurosurgery consulted by neurology, appreciate input -Neurology following, appreciate input  Hypertension CAD - Goal SBP < 150 using clevidipine drip, wean as tolerated -Continuous cardiac monitoring  Hyperlipidemia - f/u lipid panel - continue home regimen once cleared to take p.o. medication: Vascepa & Zetia  ACUTE KIDNEY INJURY/Renal Failure -continue Foley Catheter-assess need -Avoid nephrotoxic agents -Follow urine output, BMP -Ensure adequate renal perfusion, optimize oxygenation -Renal dose medications   Intake/Output Summary (Last 24  hours) at 07/26/2022 1610 Last data filed at 07/26/2022 0600 Gross per 24 hour  Intake 722.13 ml  Output 3425 ml  Net -2702.87 ml     Best Practice (right click and "Reselect all SmartList Selections" daily)  Diet/type: NPO DVT prophylaxis: SCD GI prophylaxis: PPI Lines: N/A Foley:  N/A Code Status:  full code   Labs   CBC: Recent Labs  Lab 07/20/22 0924 07/25/22 1849 07/26/22 0300  WBC 5.9 5.6 6.9  NEUTROABS  --  3.6  --   HGB 14.7 14.8 17.4*  HCT 41.3 42.3 48.8  MCV 84.5 86.7 84.6  PLT 125* 125* 117*     Basic Metabolic Panel: Recent Labs  Lab 07/20/22 0924 07/25/22 1849 07/26/22 0300  NA 140 140 140  K 3.8 4.3 4.4  CL 108 106 107  CO2 26 26 24   GLUCOSE 99 107* 110*  BUN 23 26* 22  CREATININE 1.38* 1.42* 1.15  CALCIUM 8.5* 8.8* 8.8*  MG 2.0  --  2.4  PHOS  --   --  2.9    GFR: Estimated Creatinine Clearance: 58.7 mL/min (by C-G formula based on SCr of 1.15 mg/dL). Recent Labs  Lab 07/20/22 0924 07/25/22 1849 07/26/22 0300  WBC 5.9 5.6 6.9     Liver Function Tests:  Home Medications  Prior to Admission medications   Medication Sig Start Date End Date Taking? Authorizing Provider  Ascorbic Acid (VITAMIN C) 1000 MG tablet Take 1,000 mg by mouth daily.   Yes [provider]  aspirin EC 81  MG tablet Take 1 tablet (81 mg total) by mouth daily. Swallow whole. 12/09/19  Yes End, Cristal Deer, MD  benzonatate (TESSALON) 200 MG capsule Take 1 capsule (200 mg total) by mouth 3 (three) times daily as needed for cough. 05/16/22  Yes Doreene Nest, NP  carvedilol (COREG) 3.125 MG tablet TAKE 1 TABLET(3.125 MG) BY MOUTH TWICE DAILY Patient taking differently: Take 3.125 mg by mouth 2 (two) times daily with a meal. 12/11/21  Yes Joaquim Nam, MD  cholecalciferol (VITAMIN D3) 25 MCG (1000 UNIT) tablet Take 2,000 Units by mouth daily.    Yes [provider]  Coenzyme Q10 100 MG capsule Take 100 mg by mouth daily.   Yes [provider]  ezetimibe (ZETIA) 10 MG tablet Take 1 tablet (10 mg total) by mouth daily. 04/11/22 04/06/23 Yes End, Cristal Deer, MD  fluticasone (FLONASE) 50 MCG/ACT nasal spray Place 2 sprays into both nostrils daily. 05/16/22  Yes Doreene Nest, NP  Garlic 500 MG TABS Take 500 mg by mouth daily.   Yes [provider]  icosapent Ethyl (VASCEPA) 1 g capsule Take 2 capsules (2 g total) by mouth 2 (two) times daily. 06/29/21  Yes End, Cristal Deer, MD  isosorbide mononitrate (IMDUR) 120 MG 24 hr tablet TAKE 1 TABLET(120 MG) BY MOUTH DAILY Patient taking differently: Take 120 mg by mouth daily. 06/08/22  Yes End, Cristal Deer, MD  losartan (COZAAR) 50 MG tablet Take 1 tablet (50 mg total) by mouth daily. 01/18/22 01/13/23 Yes End, Cristal Deer, MD  meclizine (ANTIVERT) 25 MG tablet Take 0.5-1 tablet (12.5 mg- 25 mg) by mouth every 6 hours as needed for dizziness 11/02/21  Yes End, Cristal Deer, MD  nitroGLYCERIN (NITROSTAT) 0.4 MG SL tablet Place 1 tablet (0.4 mg total) under the tongue every 5 (five) minutes as needed for chest pain. Maximum of 3 doses. 08/29/21  Yes End, Cristal Deer, MD  omeprazole (PRILOSEC) 20 MG capsule Take 20 mg by mouth  daily as needed.   Yes [provider]  famotidine (PEPCID) 20 MG tablet Take 1 tablet (20 mg total) by  mouth 2 (two) times daily as needed for heartburn or indigestion. Patient not taking: Reported on 07/25/2022 04/29/18   Joaquim Nam, MD  tiZANidine (ZANAFLEX) 4 MG tablet Take 0.5-1 tablets (2-4 mg total) by mouth every 8 (eight) hours as needed for muscle spasms (sedation caution). Patient not taking: Reported on 07/25/2022 06/06/21   Joaquim Nam, MD       DVT/GI PRX  assessed I Assessed the need for Labs I Assessed the need for Foley I Assessed the need for Central Venous Line Family Discussion when available I Assessed the need for Mobilization I made an Assessment of medications to be adjusted accordingly Safety Risk assessment completed  CASE DISCUSSED IN MULTIDISCIPLINARY ROUNDS WITH ICU TEAM     Critical Care Time devoted to patient care services described in this note is 55 minutes.  Critical care was necessary to treat /prevent imminent and life-threatening deterioration. Overall, patient is critically ill, prognosis is guarded.  Patient with Multiorgan failure and at high risk for cardiac arrest and death.    Lucie Leather, M.D.  [facility]  Medical Director [facility] Medical Director [facility] Cardio-Pulmonary Department

## 2022-07-26 NOTE — Progress Notes (Signed)
OT Cancellation Note  Patient Details Name: Billy Kim MRN: [id number] DOB: 1946-01-31   Cancelled Treatment:    Reason Eval/Treat Not Completed: Active bedrest order;Patient not medically ready. Consult received, chart reviewed. Per MD note, "6/6 repeat CT head shows worsening thalamic hemorrhage." Neurosurgery consult pending. Active bed rest order. Will re-attempt OT evaluation at later date/time as medically appropriate, pending updated plan of care.   Arman Filter., MPH, MS, OTR/L ascom [phone] 07/26/22, 11:13 AM

## 2022-07-26 NOTE — Evaluation (Signed)
Speech Language Pathology Evaluation Patient Details Name: Billy Kim MRN: [id number] DOB: [birth year] Today's Date: 07/26/2022 Time: 4403-4742 SLP Time Calculation (min) (ACUTE ONLY): 44 min  Problem List:  Patient Active Problem List   Diagnosis Date Noted   ICH (intracerebral hemorrhage) (HCC) 07/25/2022   Weakness 07/20/2022   Leg swelling 07/20/2022   Dizziness 11/03/2021   Abscess 08/07/2021   Actinic keratosis 08/07/2021   Melanocytic nevi of trunk 08/07/2021   Nevus of back 08/07/2021   Personal history of other malignant neoplasm of skin 08/07/2021   Medicare annual wellness visit, subsequent 11/09/2020   Coronary artery disease of native artery of native heart with stable angina pectoris (HCC) 12/17/2019   Mixed hyperlipidemia 12/17/2019   Accelerating angina (HCC) 12/11/2019   Shingles 03/01/2019   FH: prostate cancer 02/21/2017   Advance care planning 03/31/2014   PSA elevation 03/27/2013   Thrombocytopenia, unspecified (HCC) 03/27/2013   Acute cough 03/12/2013   ED (erectile dysfunction) 12/28/2011   Neoplasm of uncertain behavior of skin 12/27/2010   Exertional chest pain 01/05/2010   Essential hypertension 04/08/2007   HYPERTRIGLYCERIDEMIA 01/08/2007   GILBERT'S SYNDROME 01/08/2007   ULCERATIVE COLITIS 01/08/2007   Past Medical History:  Past Medical History:  Diagnosis Date   Coronary artery disease 01/2020   Moderate proximal LAD disease (not hemodynamically significant) and CTO's of D1 and non-dominant RCA   HLD (hyperlipidemia)    Hypertension    Ulcerative colitis 5/81   Remission for years   Past Surgical History:  Past Surgical History:  Procedure Laterality Date   CARDIAC CATHETERIZATION  03/20/01   Cardiolite EF 55% 02/10/02   CHOLECYSTECTOMY N/A 07/09/2017   Procedure: LAPAROSCOPIC CHOLECYSTECTOMY;  Surgeon: Jimmye Norman, MD;  Location: [facility] OR;  Service: General;  Laterality: N/A;   COLONOSCOPY  multiple   ENDOSCOPIC RETROGRADE  CHOLANGIOPANCREATOGRAPHY (ERCP) WITH PROPOFOL N/A 07/08/2017   Procedure: ENDOSCOPIC RETROGRADE CHOLANGIOPANCREATOGRAPHY (ERCP) WITH PROPOFOL;  Surgeon: Lynann Bologna, MD;  Location: [facility] ENDOSCOPY;  Service: Endoscopy;  Laterality: N/A;   INGUINAL HERNIA REPAIR  04/09/06   Bilateral   LAPAROSCOPIC APPENDECTOMY  03/1981   LEFT HEART CATH AND CORONARY ANGIOGRAPHY Left 12/11/2019   Procedure: LEFT HEART CATH AND CORONARY ANGIOGRAPHY;  Surgeon: Yvonne Kendall, MD;  Location: [facility] INVASIVE CV LAB;  Service: Cardiovascular;  Laterality: Left;   REMOVAL OF STONES  07/08/2017   Procedure: REMOVAL OF STONES;  Surgeon: Lynann Bologna, MD;  Location: [facility] ENDOSCOPY;  Service: Endoscopy;;   SPHINCTEROTOMY  07/08/2017   Procedure: Dennison Mascot;  Surgeon: Lynann Bologna, MD;  Location: [facility] ENDOSCOPY;  Service: Endoscopy;;   HPI:  Pt is 77 year old male presenting to [facility] ED from home on 07/25/2022 for evaluation as a code stroke. Pt with PMHx HLD, HTN, and CAD. MRI, 6/6, "1. No mass or other unexpected finding underlying the patient's left  thalamic hematoma.  2. Chronic small vessel ischemia with chronic microhemorrhages."   Assessment / Plan / Recommendation Clinical Impression  Pt seen for speech/language evaluation. Pt presents with s/sx fluent aphasia most c/w anomic aphasia. Pt's speech is mainly fluent with instances of anomia c/b paucity of speech and emerging use of circumlocution to repair breakdowns. Pt with intact confrontation naming, responsive naming, and repetition. Impaired divergent naming appreciated. Additionally, pt with subjective reports of "slurred" speech. Very minimal and seldomly occuring articulatory imprecision noted. Pt's speech is 100% intelligible to an unfamilair listener. Pt with good awareness of speech errors. Pt demonstrated intact auditory comprehension for basic information and mild difficulty with complex  information (e.g. yes/no questions, 2-step/multistep commands). Pt unable to  write address due to anomia. Further assessment of reading/writing TBA.   Pt participated in skilled SLP services targeting functional expressive communication including, but not limited to, education re: principles of neuroplasticity, changes to speech/language following ICH, aphasia, positive prognostic indicators for speech/language improvement, basic communication strategies, self-advocacy skills, and SLP POC as well as participation in semantic features analysis for x3 common nouns with moderate assistance.   Based on today's assessment, anticipate need for post-acute SLP services for aphasia. SLP to continue to follow while pt in house.    SLP Assessment  SLP Recommendation/Assessment: Patient needs continued Speech Lanaguage Pathology Services SLP Visit Diagnosis: Aphasia (R47.01)    Recommendations for follow up therapy are one component of a multi-disciplinary discharge planning process, led by the attending physician.  Recommendations may be updated based on patient status, additional functional criteria and insurance authorization.    Follow Up Recommendations  Follow physician's recommendations for discharge plan and follow up therapies (continued SLP services in line with OT/PT recommendations)    Assistance Recommended at Discharge  Intermittent Supervision/Assistance  Functional Status Assessment Patient has had a recent decline in their functional status and demonstrates the ability to make significant improvements in function in a reasonable and predictable amount of time.  Frequency and Duration min 2x/week  2 weeks      SLP Evaluation Cognition  Overall Cognitive Status: Difficult to assess Arousal/Alertness: Awake/alert Orientation Level: Oriented X4       Comprehension  Auditory Comprehension Overall Auditory Comprehension: Appears within functional limits for tasks assessed Yes/No Questions: Impaired Complex Questions: 75-100% accurate Commands: Impaired Two  Step Basic Commands: 75-100% accurate Multistep Basic Commands: 75-100% accurate Conversation: Complex    Expression Expression Primary Mode of Expression: Verbal Verbal Expression Overall Verbal Expression: Impaired Initiation: No impairment Automatic Speech: Name;Social Response;Counting;Day of week Level of Generative/Spontaneous Verbalization: Sentence;Conversation Repetition: No impairment Naming: Impairment Responsive:  (WFL) Confrontation:  (WFL) Divergent:  (4 animals in 45s) Verbal Errors:  (circumlocutory speech) Pragmatics: Impairment Impairments: Topic maintenance Written Expression Dominant Hand: Right Written Expression: Not tested   Oral / Motor  Oral Motor/Sensory Function Overall Oral Motor/Sensory Function: Mild impairment Facial ROM: Within Functional Limits Lingual ROM: Reduced right;Suspected CN XII (hypoglossal) dysfunction Lingual Strength: Reduced;Suspected CN XII (hypoglossal) dysfunction Motor Speech Overall Motor Speech: Appears within functional limits for tasks assessed Respiration: Within functional limits Phonation: Normal Resonance: Within functional limits Articulation: Impaired (very minimal articulatory imprecision noted; pt states speech is "slurred") Intelligibility: Intelligible Motor Planning: Witnin functional limits           Clyde Canterbury, M.S., CCC-SLP Speech-Language Pathologist [facility] [phone] (ASCOM)  Woodroe Chen 07/26/2022, 1:56 PM

## 2022-07-26 NOTE — Progress Notes (Signed)
PHARMACY CONSULT NOTE  Pharmacy Consult for Electrolyte Monitoring and Replacement   Recent Labs: Potassium (mmol/L)  Date Value  07/26/2022 4.4   Magnesium (mg/dL)  Date Value  76/28/3151 2.4   Calcium (mg/dL)  Date Value  76/16/0737 8.8 (L)   Albumin (g/dL)  Date Value  10/62/6948 4.0  09/28/2020 3.9   Phosphorus (mg/dL)  Date Value  54/62/7035 2.9   Sodium (mmol/L)  Date Value  07/26/2022 140  09/28/2020 144     Assessment: 77 year old male admitted with ICH. Past medical history includes CAD, ulcerative colitis, HTN, HLD.   Blood pressure control: clevidipine infusion  Goal of Therapy:  Electrolytes within normal limits  Plan:  No replacement warranted Follow up electrolytes tomorrow AM  Elliot Gurney, PharmD, BCPS Clinical Pharmacist  07/26/2022 3:25 PM

## 2022-07-26 NOTE — Progress Notes (Signed)
*  PRELIMINARY RESULTS* Echocardiogram 2D Echocardiogram has been performed.  Derrill Bagnell 07/26/2022, 10:48 AM 

## 2022-07-26 NOTE — Progress Notes (Signed)
SLP Cancellation Note  Patient Details Name: Billy Kim MRN: [id number] DOB: 04-22-1945   Cancelled treatment:       Reason Eval/Treat Not Completed: Patient at procedure or test/unavailable. Pt undergoing echocardiogram. Will continue efforts as appropriate.  Billy Kim, M.S., CCC-SLP Speech-Language Pathologist [facility] [phone] Billy Kim)  Billy Kim 07/26/2022, 9:39 AM

## 2022-07-26 NOTE — Consult Note (Addendum)
Consulting Department: Emergency department   Primary Physician:  Joaquim Nam, MD  Chief Complaint: Thalamic hemorrhage  History of Present Illness: 07/26/2022 Billy Kim is a 77 y.o. male who presents with the chief complaint of facial weakness.  He has a history of coronary artery disease, hyperlipidemia, hypertension who developed slurring of his speech and facial droop.  He was not having any headaches but did feel unwell.  He does have a history of hypertension.  Has not had any history of bleeds or anticoagulation.  Review of Systems:  A 10 point review of systems is negative, except for the pertinent positives and negatives detailed in the HPI.  Past Medical History: Past Medical History:  Diagnosis Date   Coronary artery disease 01/2020   Moderate proximal LAD disease (not hemodynamically significant) and CTO's of D1 and non-dominant RCA   HLD (hyperlipidemia)    Hypertension    Ulcerative colitis 5/81   Remission for years    Past Surgical History: Past Surgical History:  Procedure Laterality Date   CARDIAC CATHETERIZATION  03/20/01   Cardiolite EF 55% 02/10/02   CHOLECYSTECTOMY N/A 07/09/2017   Procedure: LAPAROSCOPIC CHOLECYSTECTOMY;  Surgeon: Jimmye Norman, MD;  Location: [facility] OR;  Service: General;  Laterality: N/A;   COLONOSCOPY  multiple   ENDOSCOPIC RETROGRADE CHOLANGIOPANCREATOGRAPHY (ERCP) WITH PROPOFOL N/A 07/08/2017   Procedure: ENDOSCOPIC RETROGRADE CHOLANGIOPANCREATOGRAPHY (ERCP) WITH PROPOFOL;  Surgeon: Lynann Bologna, MD;  Location: [facility] ENDOSCOPY;  Service: Endoscopy;  Laterality: N/A;   INGUINAL HERNIA REPAIR  04/09/06   Bilateral   LAPAROSCOPIC APPENDECTOMY  03/1981   LEFT HEART CATH AND CORONARY ANGIOGRAPHY Left 12/11/2019   Procedure: LEFT HEART CATH AND CORONARY ANGIOGRAPHY;  Surgeon: Yvonne Kendall, MD;  Location: [facility] INVASIVE CV LAB;  Service: Cardiovascular;  Laterality: Left;   REMOVAL OF STONES  07/08/2017   Procedure: REMOVAL OF STONES;   Surgeon: Lynann Bologna, MD;  Location: [facility] ENDOSCOPY;  Service: Endoscopy;;   SPHINCTEROTOMY  07/08/2017   Procedure: Dennison Mascot;  Surgeon: Lynann Bologna, MD;  Location: [facility] ENDOSCOPY;  Service: Endoscopy;;    Allergies: Allergies as of 07/25/2022 - Review Complete 07/25/2022  Allergen Reaction Noted   Amoxicillin Other (See Comments)    Lyrica [pregabalin]  03/06/2019   Morphine sulfate Other (See Comments)    Neurontin [gabapentin] Other (See Comments) 03/03/2019   Sulfonamide derivatives Other (See Comments)     Medications:  Current Facility-Administered Medications:     stroke: early stages of recovery book, , Does not apply, Once, Jefferson Fuel, MD   acetaminophen (TYLENOL) tablet 650 mg, 650 mg, Oral, Q4H PRN **OR** acetaminophen (TYLENOL) 160 MG/5ML solution 650 mg, 650 mg, Per Tube, Q4H PRN **OR** acetaminophen (TYLENOL) suppository 650 mg, 650 mg, Rectal, Q4H PRN, Jefferson Fuel, MD   Chlorhexidine Gluconate Cloth 2 % PADS 6 each, 6 each, Topical, QHS, Kasa, Wallis Bamberg, MD, 6 each at 07/25/22 2044   clevidipine (CLEVIPREX) infusion 0.5 mg/mL, 0-21 mg/hr, Intravenous, Continuous, Jefferson Fuel, MD, Last Rate: 5 mL/hr at 07/26/22 1519, 2.5 mg/hr at 07/26/22 1519   docusate sodium (COLACE) capsule 100 mg, 100 mg, Oral, BID PRN, Rust-Chester, Micheline Rough L, NP   pantoprazole (PROTONIX) injection 40 mg, 40 mg, Intravenous, QHS, Jefferson Fuel, MD, 40 mg at 07/25/22 2234   polyethylene glycol (MIRALAX / GLYCOLAX) packet 17 g, 17 g, Oral, Daily PRN, Rust-Chester, Cecelia Byars, NP   senna-docusate (Senokot-S) tablet 1 tablet, 1 tablet, Oral, BID, Jefferson Fuel, MD   Social History: Social History  Tobacco Use   Smoking status: Never   Smokeless tobacco: Never  Vaping Use   Vaping Use: Never used  Substance Use Topics   Alcohol use: No   Drug use: No    Family Medical History: Family History  Problem Relation Age of Onset   Cancer Mother        ? GYN, died after  hysterectomy   Stroke Father    CAD Father    Hypertension Sister    Prostate cancer Brother    CAD Brother 85       CABG   Bladder Cancer Brother 19       Bladder   Hypertension Brother    Hypertension Brother    Hypertension Brother    Hypertension Brother    Hypertension Brother    Bone cancer Paternal Grandfather    Hypertension Son    Prostate cancer Nephew    Bladder Cancer Nephew    Colon cancer Neg Hx    Esophageal cancer Neg Hx    Rectal cancer Neg Hx    Stomach cancer Neg Hx     Physical Examination: Vitals:   07/26/22 1515 07/26/22 1530  BP: 128/76 136/77  Pulse: (!) 59 (!) 59  Resp: 14 12  Temp:    SpO2: 99% 96%     General: Patient is well developed, well nourished, calm, collected, and in no apparent distress.  NEUROLOGICAL:  General: In no acute distress.  Alert and oriented x 4 Awake, alert, oriented to person, place, and time.  Pupils equal round and reactive to light.  Patient shows a right-sided facial weakness with sparing of his forehead.  Tongue protrusion is midline.  There is no pronator drift.  Language is conversant.  GCS:15   Bilateral upper and lower extremity sensation is intact to light touch.  Imaging: Narrative & Impression  CLINICAL DATA:  Intracranial hemorrhage follow up   EXAM: CT HEAD WITHOUT CONTRAST   TECHNIQUE: Contiguous axial images were obtained from the base of the skull through the vertex without intravenous contrast.   RADIATION DOSE REDUCTION: This exam was performed according to the departmental dose-optimization program which includes automated exposure control, adjustment of the mA and/or kV according to patient size and/or use of iterative reconstruction technique.   COMPARISON:  07/25/2022 at 6:57 p.m.   FINDINGS: Brain: Left thalamic intraparenchymal hematoma has slightly increased in size to 2.3 x 1.6 x 1.4 cm (2.5 mL, previously 1.5 mL). The examination is otherwise unchanged.   Vascular: No  hyperdense vessel or unexpected calcification.   Skull: Normal. Negative for fracture or focal lesion.   Sinuses/Orbits: No acute finding.   Other: None.   IMPRESSION: Left thalamic intraparenchymal hematoma has slightly increased in size to 2.3 x 1.6 x 1.4 cm (2.5 mL, previously 1.5 mL). The examination is otherwise unchanged.     Electronically Signed   By: Deatra Robinson M.D.   On: 07/26/2022 00:58     I have personally reviewed the images and agree with the above interpretation.  Labs:    Latest Ref Rng & Units 07/26/2022    3:00 AM 07/25/2022    6:49 PM 07/20/2022    9:24 AM  CBC  WBC 4.0 - 10.5 K/uL 6.9  5.6  5.9   Hemoglobin 13.0 - 17.0 g/dL 16.1  09.6  04.5   Hematocrit 39.0 - 52.0 % 48.8  42.3  41.3   Platelets 150 - 400 K/uL 117  125  125  Assessment and Plan: Mr. Dimuzio is a pleasant 77 y.o. male with a right-sided facial droop who was found to have a thalamic hemorrhage.  Currently has a low ICH score.  He does not have any extremity weakness.  Will plan to get a follow-up head CT, agree with neurology for a MRI to rule out any masses  At this point there does not appear to be a clear indication for surgical intervention.  Will plan to continue to follow.  Plan to the further medical management of the intraparenchymal hemorrhage to her teammates and neurology.  They have been consulted.  Lovenia Kim, MD/MSCR Dept. of Neurosurgery      Follow-up head CT showed minimal expansion but did show some worsening.  MRI was performed which demonstrated no evidence of intracranial masses.  Some likely history of microvascular disease noted on MRI with some small areas of ischemia which appear chronic.  Again will defer the medical management to our neurology colleagues.  No indication for surgery.  Narrative & Impression  CLINICAL DATA:  Follow-up intracranial hemorrhage.   EXAM: MRI HEAD WITHOUT AND WITH CONTRAST   TECHNIQUE: Multiplanar, multiecho  pulse sequences of the brain and surrounding structures were obtained without and with intravenous contrast.   CONTRAST:  8mL GADAVIST GADOBUTROL 1 MMOL/ML IV SOLN   COMPARISON:  CT and CTA from yesterday.   FINDINGS: Brain: Known acute hemorrhage in the left thalamus measuring up to 2 cm. Faint rim of enhancement which is likely reactive, no spot sign or vascular lesion seen underlying the hematoma on recent CTA. No underlying masslike findings. There is a small rim of edema. No intraventricular extension.   Mild FLAIR hyperintensity in the cerebral white matter for age, attributed to chronic small vessel ischemia. There are chronic microhemorrhages primarily in the deep brain and best attributed to chronic hypertension.   No hydrocephalus.   Vascular: Major flow voids and vascular enhancements are preserved   Skull and upper cervical spine: Normal marrow signal   Sinuses/Orbits: Negative   IMPRESSION: 1. No mass or other unexpected finding underlying the patient's left thalamic hematoma. 2. Chronic small vessel ischemia with chronic microhemorrhages.     Electronically Signed   By: Tiburcio Pea M.D.   On: 07/26/2022 07:55

## 2022-07-26 NOTE — Progress Notes (Addendum)
ICH CT head without contrast 07/26/2022: Left thalamic intraparenchymal hematoma has slightly increased in size to 2.3 x 1.6 x 1.4 cm (2.5 mL, previously 1.5 mL). The examination is otherwise unchanged. NIHSS remains 1 however patient noted to have slight increase in word finding difficulties.  Neurosurgery on-call paged. Spoke with neurology on call. Continue strict BP control <150 and monitor NIHSS.   Cheryll Cockayne Rust-Chester, AGACNP-BC Acute Care Nurse Practitioner [facility]   [phone] / [phone] Please see Amion for details.

## 2022-07-26 NOTE — Progress Notes (Signed)
PT Cancellation Note  Patient Details Name: Billy Kim MRN: [id number] DOB: [birth year]   Cancelled Treatment:    Reason Eval/Treat Not Completed: Patient not medically ready. Will re-attempt at later date as appropriate.    Malaya Cagley 07/26/2022, 12:14 PM

## 2022-07-26 NOTE — Progress Notes (Signed)
Neurology Progress Note  Subjective: Patient continues to do well, mild dysarthria and minimal WFD. He remains on clevidipine for BP control.  Exam: Vitals:   07/26/22 1515 07/26/22 1530  BP: 128/76 136/77  Pulse: (!) 59 (!) 59  Resp: 14 12  Temp:    SpO2: 99% 96%   Physical Exam Gen: A&O x4, NAD Resp: normal WOB CV: extremities appear well-perfused   Neuro: *MS: A&O x4. Follows multi-step commands.  *Speech: minimal dysarthria, mild aphasia, able to name and repeat *CN: PERRL 3mm, EOMI, VFF by confrontation, sensation intact, R UMN facial droop, hearing intact to voice *Motor:   Normal bulk.  No tremor, rigidity or bradykinesia. No pronator drift. 5/5 strength throughout *Sensory: SILT. Symmetric. No double-simultaneous extinction.  *Coordination:  Finger-to-nose, heel-to-shin, rapid alternating motions were intact. *Reflexes:  2+ symm, toes down bilat *Gait: deferred   NIHSS = 3 for facial droop, dysarthria, and aphasia  mRS = 0  ICH score = 0  Data:  Head CT in ED 1. Acute intraparenchymal hemorrhage in the left thalamus measuring 1.8 x 1.4 x 1.2 cm (approximately 1.5 mL). 2. No midline shift or other mass effect.  Head CT 6 hrs later Left thalamic intraparenchymal hematoma has slightly increased in size to 2.3 x 1.6 x 1.4 cm (2.5 mL, previously 1.5 mL). The examination is otherwise unchanged.  CTA head 1. Normal CTA of the head.  No aneurysm or vascular malformation. 2. Unchanged appearance of left thalamic intraparenchymal hematoma, most consistent with hypertensive hemorrhage.  MRI brain wwo 1. No mass or other unexpected finding underlying the patient's left thalamic hematoma. 2. Chronic small vessel ischemia with chronic microhemorrhages.  CNS imaging personally reviewed; I agree with above interpretations  Impression: This is a 77 yo man with hx CAD, HL, HTN, UC who presents with word finding difficulty and balance issues and was found to have a  small L thalamic hemorrhage, likely hypertensive in etiology. He is not on anticoagulation. He remains on clevidipine for BP control  Recommendations: - Goal SBP <150 - Restart home antihypertensives today - If exam is stable and he is at BP goal off clevidipine he can be transferred to floor tomorrow - SCDs for DVT prophylaxis - HOB elevated 30 degrees - Neurochecks and vitals per unit routine - No antiplatelets or anticoagulation in setting of acute ICH  Will continue to follow   This patient is critically ill and at significant risk of neurological worsening, death and care requires constant monitoring of vital signs, hemodynamics,respiratory and cardiac monitoring, neurological assessment, discussion with family, other specialists and medical decision making of high complexity. I spent 45 minutes of neurocritical care time  in the care of  this patient. This was time spent independent of any time provided by nurse practitioner or PA.  Bing Neighbors, MD [facility] [phone]  If 7pm- 7am, please page neurology on call as listed in AMION.

## 2022-07-27 ENCOUNTER — Inpatient Hospital Stay: Payer: PPO

## 2022-07-27 DIAGNOSIS — R0682 Tachypnea, not elsewhere classified: Secondary | ICD-10-CM

## 2022-07-27 DIAGNOSIS — R4701 Aphasia: Secondary | ICD-10-CM | POA: Diagnosis not present

## 2022-07-27 DIAGNOSIS — I619 Nontraumatic intracerebral hemorrhage, unspecified: Secondary | ICD-10-CM | POA: Diagnosis not present

## 2022-07-27 DIAGNOSIS — Z7409 Other reduced mobility: Secondary | ICD-10-CM

## 2022-07-27 DIAGNOSIS — I161 Hypertensive emergency: Secondary | ICD-10-CM | POA: Diagnosis not present

## 2022-07-27 DIAGNOSIS — I610 Nontraumatic intracerebral hemorrhage in hemisphere, subcortical: Secondary | ICD-10-CM | POA: Diagnosis not present

## 2022-07-27 DIAGNOSIS — N179 Acute kidney failure, unspecified: Secondary | ICD-10-CM

## 2022-07-27 LAB — BASIC METABOLIC PANEL
Anion gap: 7 (ref 5–15)
BUN: 28 mg/dL — ABNORMAL HIGH (ref 8–23)
CO2: 25 mmol/L (ref 22–32)
Calcium: 8.8 mg/dL — ABNORMAL LOW (ref 8.9–10.3)
Chloride: 107 mmol/L (ref 98–111)
Creatinine, Ser: 1.46 mg/dL — ABNORMAL HIGH (ref 0.61–1.24)
GFR, Estimated: 49 mL/min — ABNORMAL LOW (ref >60)
Glucose, Bld: 112 mg/dL — ABNORMAL HIGH (ref 70–99)
Potassium: 3.9 mmol/L (ref 3.5–5.1)
Sodium: 139 mmol/L (ref 135–145)

## 2022-07-27 LAB — GLUCOSE, CAPILLARY
Glucose-Capillary: 122 mg/dL — ABNORMAL HIGH (ref 70–99)
Glucose-Capillary: 123 mg/dL — ABNORMAL HIGH (ref 70–99)
Glucose-Capillary: 145 mg/dL — ABNORMAL HIGH (ref 70–99)
Glucose-Capillary: 151 mg/dL — ABNORMAL HIGH (ref 70–99)
Glucose-Capillary: 109 mg/dL — ABNORMAL HIGH (ref 70–99)

## 2022-07-27 LAB — HEMOGLOBIN A1C
Hgb A1c MFr Bld: 5.3 % (ref 4.8–5.6)
Mean Plasma Glucose: 105 mg/dL

## 2022-07-27 MED ORDER — HYDRALAZINE HCL 20 MG/ML IJ SOLN: 10.0000 mg | INTRAMUSCULAR | Status: DC | PRN

## 2022-07-27 MED ORDER — LIDOCAINE 5 % EX PTCH
2.0000 | MEDICATED_PATCH | CUTANEOUS | Status: DC
  Administered 2022-07-27 – 2022-07-30 (×4): 2 via TRANSDERMAL
  Filled 2022-07-27 (×5): qty 2

## 2022-07-27 MED ORDER — PANTOPRAZOLE SODIUM 40 MG PO TBEC
40.0000 mg | DELAYED_RELEASE_TABLET | Freq: Every day | ORAL | Status: DC
  Administered 2022-07-27 – 2022-07-31 (×5): 40 mg via ORAL
  Filled 2022-07-27 (×5): qty 1

## 2022-07-27 MED ORDER — ENOXAPARIN SODIUM 40 MG/0.4ML IJ SOSY: 40.0000 mg | PREFILLED_SYRINGE | INTRAMUSCULAR | Status: DC

## 2022-07-27 NOTE — Consult Note (Signed)
Physical Medicine and Rehabilitation Consult Reason for Consult: ICH Referring Physician: Erin Fulling, MD   HPI: Billy Kim is a 77 y.o. male who was admitted on 07/25/22 with an acute small left thalamic hemorrhage in the setting of uncontrolled hypertension. Repeat CT on 6/6 showed worsening hemorrhage. PMH includes CAD, ulcerative colitis, HTN, and HLD. Physical Medicine & Rehabilitation was consulted to assess candidacy for CIR.     ROS +impaired balance, gait, expressive communication Past Medical History:  Diagnosis Date   Coronary artery disease 01/2020   Moderate proximal LAD disease (not hemodynamically significant) and CTO's of D1 and non-dominant RCA   HLD (hyperlipidemia)    Hypertension    Ulcerative colitis 5/81   Remission for years   Past Surgical History:  Procedure Laterality Date   CARDIAC CATHETERIZATION  03/20/01   Cardiolite EF 55% 02/10/02   CHOLECYSTECTOMY N/A 07/09/2017   Procedure: LAPAROSCOPIC CHOLECYSTECTOMY;  Surgeon: Jimmye Norman, MD;  Location: [facility] OR;  Service: General;  Laterality: N/A;   COLONOSCOPY  multiple   ENDOSCOPIC RETROGRADE CHOLANGIOPANCREATOGRAPHY (ERCP) WITH PROPOFOL N/A 07/08/2017   Procedure: ENDOSCOPIC RETROGRADE CHOLANGIOPANCREATOGRAPHY (ERCP) WITH PROPOFOL;  Surgeon: Lynann Bologna, MD;  Location: [facility] ENDOSCOPY;  Service: Endoscopy;  Laterality: N/A;   INGUINAL HERNIA REPAIR  04/09/06   Bilateral   LAPAROSCOPIC APPENDECTOMY  03/1981   LEFT HEART CATH AND CORONARY ANGIOGRAPHY Left 12/11/2019   Procedure: LEFT HEART CATH AND CORONARY ANGIOGRAPHY;  Surgeon: Yvonne Kendall, MD;  Location: [facility] INVASIVE CV LAB;  Service: Cardiovascular;  Laterality: Left;   REMOVAL OF STONES  07/08/2017   Procedure: REMOVAL OF STONES;  Surgeon: Lynann Bologna, MD;  Location: [facility] ENDOSCOPY;  Service: Endoscopy;;   SPHINCTEROTOMY  07/08/2017   Procedure: Dennison Mascot;  Surgeon: Lynann Bologna, MD;  Location: [facility] ENDOSCOPY;  Service: Endoscopy;;    Family History  Problem Relation Age of Onset   Cancer Mother        ? GYN, died after hysterectomy   Stroke Father    CAD Father    Hypertension Sister    Prostate cancer Brother    CAD Brother 34       CABG   Bladder Cancer Brother 62       Bladder   Hypertension Brother    Hypertension Brother    Hypertension Brother    Hypertension Brother    Hypertension Brother    Bone cancer Paternal Grandfather    Hypertension Son    Prostate cancer Nephew    Bladder Cancer Nephew    Colon cancer Neg Hx    Esophageal cancer Neg Hx    Rectal cancer Neg Hx    Stomach cancer Neg Hx    Social History:  reports that he has never smoked. He has never used smokeless tobacco. He reports that he does not drink alcohol and does not use drugs. Allergies:  Allergies  Allergen Reactions   Amoxicillin Other (See Comments)    Caused headache Has patient had a PCN reaction causing immediate rash, facial/tongue/throat swelling, SOB or lightheadedness with hypotension: No Has patient had a PCN reaction causing severe rash involving mucus membranes or skin necrosis: No Has patient had a PCN reaction that required hospitalization: No Has patient had a PCN reaction occurring within the last 10 years: No If all of the above answers are "NO", then may proceed with Cephalosporin use.   Lyrica [Pregabalin]     Intolerant, worsening headache   Morphine Sulfate Other (See Comments)  headache   Neurontin [Gabapentin] Other (See Comments)    headache   Sulfonamide Derivatives Other (See Comments)    headache   Medications Prior to Admission  Medication Sig Dispense Refill   Ascorbic Acid (VITAMIN C) 1000 MG tablet Take 1,000 mg by mouth daily.     aspirin EC 81 MG tablet Take 1 tablet (81 mg total) by mouth daily. Swallow whole. 90 tablet 3   benzonatate (TESSALON) 200 MG capsule Take 1 capsule (200 mg total) by mouth 3 (three) times daily as needed for cough. 15 capsule 0   carvedilol (COREG)  3.125 MG tablet TAKE 1 TABLET(3.125 MG) BY MOUTH TWICE DAILY (Patient taking differently: Take 3.125 mg by mouth 2 (two) times daily with a meal.) 180 tablet 3   cholecalciferol (VITAMIN D3) 25 MCG (1000 UNIT) tablet Take 2,000 Units by mouth daily.      Coenzyme Q10 100 MG capsule Take 100 mg by mouth daily.     ezetimibe (ZETIA) 10 MG tablet Take 1 tablet (10 mg total) by mouth daily. 90 tablet 3   fluticasone (FLONASE) 50 MCG/ACT nasal spray Place 2 sprays into both nostrils daily. 16 g 0   Garlic 500 MG TABS Take 500 mg by mouth daily.     icosapent Ethyl (VASCEPA) 1 g capsule Take 2 capsules (2 g total) by mouth 2 (two) times daily. 120 capsule 3   isosorbide mononitrate (IMDUR) 120 MG 24 hr tablet TAKE 1 TABLET(120 MG) BY MOUTH DAILY (Patient taking differently: Take 120 mg by mouth daily.) 90 tablet 0   losartan (COZAAR) 50 MG tablet Take 1 tablet (50 mg total) by mouth daily. 90 tablet 3   meclizine (ANTIVERT) 25 MG tablet Take 0.5-1 tablet (12.5 mg- 25 mg) by mouth every 6 hours as needed for dizziness 25 tablet 0   nitroGLYCERIN (NITROSTAT) 0.4 MG SL tablet Place 1 tablet (0.4 mg total) under the tongue every 5 (five) minutes as needed for chest pain. Maximum of 3 doses. 25 tablet 1   omeprazole (PRILOSEC) 20 MG capsule Take 20 mg by mouth daily as needed.     famotidine (PEPCID) 20 MG tablet Take 1 tablet (20 mg total) by mouth 2 (two) times daily as needed for heartburn or indigestion. (Patient not taking: Reported on 07/25/2022)     tiZANidine (ZANAFLEX) 4 MG tablet Take 0.5-1 tablets (2-4 mg total) by mouth every 8 (eight) hours as needed for muscle spasms (sedation caution). (Patient not taking: Reported on 07/25/2022) 30 tablet 0    Home: Home Living Family/patient expects to be discharged to:: Private residence Living Arrangements: Spouse/significant other Available Help at Discharge: Family, Available 24 hours/day Type of Home: House Home Access: Stairs to enter [employer] of Steps: 3 Entrance Stairs-Rails: Right, Left, Can reach both Home Layout: Two level, Able to live on main level with bedroom/bathroom Home Equipment: Other (comment)  Lives With: Spouse  Functional History: Prior Function Prior Level of Function : Independent/Modified Independent, Working/employed, Driving Mobility Comments: working full time in [employer], sport and exercise Status:  Mobility: Bed Mobility Overal bed mobility: Needs Assistance Bed Mobility: Supine to Sit Supine to sit: Supervision, Min guard, HOB elevated General bed mobility comments: extra time for supine>sit with HOB elevated Transfers Overall transfer level: Needs assistance Equipment used: None Transfers: Sit to/from Stand Sit to Stand: Min assist General transfer comment: STS from EOB, recliner without AD Ambulation/Gait Ambulation/Gait assistance: Min assist Gait Distance (Feet): 100 Feet Assistive device: None Gait Pattern/deviations:  Decreased stride length, Decreased dorsiflexion - right, Decreased step length - right, Decreased weight shift to left General Gait Details: decreased foot clearance RLE. PT provides cuing for corrected gait pattern with RLE with pt only able to demonstrate x 1 step,  otherwise reverts back to impaired gait pattern as noted above. Pt with R inattention, bumping doorway on R side x 2. Gait velocity: decreased    ADL:    Cognition: Cognition Overall Cognitive Status: Difficult to assess Arousal/Alertness: Awake/alert Orientation Level: Oriented X4 Cognition Arousal/Alertness: Awake/alert Behavior During Therapy: WFL for tasks assessed/performed Overall Cognitive Status: Difficult to assess General Comments: Pt follows simple commands throughout session with extra time PRN. Difficult to assess due to: Impaired communication (2/2 expressive deficits)  Blood pressure 133/79, pulse 68, temperature 98.2 F (36.8 C), resp. rate 18, height 5\' 8"  (1.727 m), weight  93.5 kg, SpO2 95 %. Physical Exam Gen: no distress, normal appearing HEENT: oral mucosa pink and moist, NCAT Cardio: Reg rate Chest: normal effort, normal rate of breathing Abd: soft, non-distended Ext: no edema Psych: pleasant, normal affect Skin: intact Neuro: Impaired balance and gait, expressive aphasia, right sided inattention. 5/5 strength except for decreased strength in right dorsiflexion  Results for orders placed or performed during the hospital encounter of 07/25/22 (from the past 24 hour(s))  Glucose, capillary     Status: Abnormal   Collection Time: 07/26/22  3:28 PM  Result Value Ref Range   Glucose-Capillary 110 (H) 70 - 99 mg/dL  Glucose, capillary     Status: Abnormal   Collection Time: 07/26/22  7:32 PM  Result Value Ref Range   Glucose-Capillary 127 (H) 70 - 99 mg/dL  Glucose, capillary     Status: Abnormal   Collection Time: 07/26/22 11:45 PM  Result Value Ref Range   Glucose-Capillary 130 (H) 70 - 99 mg/dL  Glucose, capillary     Status: Abnormal   Collection Time: 07/27/22  4:12 AM  Result Value Ref Range   Glucose-Capillary 122 (H) 70 - 99 mg/dL  Glucose, capillary     Status: Abnormal   Collection Time: 07/27/22  7:15 AM  Result Value Ref Range   Glucose-Capillary 123 (H) 70 - 99 mg/dL  Basic metabolic panel     Status: Abnormal   Collection Time: 07/27/22  8:33 AM  Result Value Ref Range   Sodium 139 135 - 145 mmol/L   Potassium 3.9 3.5 - 5.1 mmol/L   Chloride 107 98 - 111 mmol/L   CO2 25 22 - 32 mmol/L   Glucose, Bld 112 (H) 70 - 99 mg/dL   BUN 28 (H) 8 - 23 mg/dL   Creatinine, Ser 9.60 (H) 0.61 - 1.24 mg/dL   Calcium 8.8 (L) 8.9 - 10.3 mg/dL   GFR, Estimated 49 (L) >60 mL/min   Anion gap 7 5 - 15  Glucose, capillary     Status: Abnormal   Collection Time: 07/27/22 11:03 AM  Result Value Ref Range   Glucose-Capillary 145 (H) 70 - 99 mg/dL   ECHOCARDIOGRAM COMPLETE  Result Date: 07/26/2022    ECHOCARDIOGRAM REPORT   Patient Name:   BRIGGS ALCIDE  Seyler Date of Exam: 07/26/2022 Medical Rec #:  [id number]    Height:       68.0 in Accession #:    [id number]   Weight:       206.1 lb Date of Birth:  1945/08/30     BSA:          2.070 m  Patient Age:    77 years     BP:           146/83 mmHg Patient Gender: M            HR:           63 bpm. Exam Location:  [facility] Procedure: 2D Echo, Color Doppler and Cardiac Doppler Indications:     Stroke I63.9  History:         Patient has prior history of Echocardiogram examinations, most                  recent 10/02/2019. CAD; Risk Factors:Dyslipidemia and                  Hypertension.  Sonographer:     Cristela Blue Referring Phys:  [id number] Malachi Carl STACK Diagnosing Phys: Julien Nordmann MD  Sonographer Comments: Suboptimal apical window. Apicals are off axis. IMPRESSIONS  1. Left ventricular ejection fraction, by estimation, is 60 to 65%. The left ventricle has normal function. The left ventricle has no regional wall motion abnormalities. Left ventricular diastolic parameters are consistent with Grade I diastolic dysfunction (impaired relaxation).  2. Right ventricular systolic function is normal. The right ventricular size is normal. There is normal pulmonary artery systolic pressure. The estimated right ventricular systolic pressure is 15.8 mmHg.  3. The mitral valve is normal in structure. No evidence of mitral valve regurgitation. No evidence of mitral stenosis.  4. The aortic valve is normal in structure. Aortic valve regurgitation is not visualized. No aortic stenosis is present.  5. The inferior vena cava is normal in size with greater than 50% respiratory variability, suggesting right atrial pressure of 3 mmHg. FINDINGS  Left Ventricle: Left ventricular ejection fraction, by estimation, is 60 to 65%. The left ventricle has normal function. The left ventricle has no regional wall motion abnormalities. The left ventricular internal cavity size was normal in size. There is  no left ventricular hypertrophy. Left ventricular  diastolic parameters are consistent with Grade I diastolic dysfunction (impaired relaxation). Right Ventricle: The right ventricular size is normal. No increase in right ventricular wall thickness. Right ventricular systolic function is normal. There is normal pulmonary artery systolic pressure. The tricuspid regurgitant velocity is 1.64 m/s, and  with an assumed right atrial pressure of 5 mmHg, the estimated right ventricular systolic pressure is 15.8 mmHg. Left Atrium: Left atrial size was normal in size. Right Atrium: Right atrial size was normal in size. Pericardium: There is no evidence of pericardial effusion. Mitral Valve: The mitral valve is normal in structure. No evidence of mitral valve regurgitation. No evidence of mitral valve stenosis. MV peak gradient, 3.4 mmHg. The mean mitral valve gradient is 2.0 mmHg. Tricuspid Valve: The tricuspid valve is normal in structure. Tricuspid valve regurgitation is not demonstrated. No evidence of tricuspid stenosis. Aortic Valve: The aortic valve is normal in structure. Aortic valve regurgitation is not visualized. No aortic stenosis is present. Aortic valve mean gradient measures 2.0 mmHg. Aortic valve peak gradient measures 3.6 mmHg. Pulmonic Valve: The pulmonic valve was normal in structure. Pulmonic valve regurgitation is not visualized. No evidence of pulmonic stenosis. Aorta: The aortic root is normal in size and structure. Venous: The inferior vena cava is normal in size with greater than 50% respiratory variability, suggesting right atrial pressure of 3 mmHg. IAS/Shunts: No atrial level shunt detected by color flow Doppler.  LEFT VENTRICLE PLAX 2D LVIDd:         4.10 cm  Diastology LVIDs:         2.70 cm   LV e' medial:    5.98 cm/s LV PW:         1.20 cm   LV E/e' medial:  9.7 LV IVS:        1.10 cm   LV e' lateral:   11.00 cm/s LVOT diam:     2.00 cm   LV E/e' lateral: 5.3 LVOT Area:     3.14 cm  RIGHT VENTRICLE RV Basal diam:  2.80 cm RV Mid diam:    2.60  cm LEFT ATRIUM             Index        RIGHT ATRIUM           Index LA diam:        3.90 cm 1.88 cm/m   RA Area:     12.00 cm LA Vol (A2C):   40.9 ml 19.76 ml/m  RA Volume:   21.30 ml  10.29 ml/m LA Vol (A4C):   66.3 ml 32.03 ml/m LA Biplane Vol: 53.6 ml 25.89 ml/m  AORTIC VALVE AV Vmax:      94.55 cm/s AV Vmean:     65.550 cm/s AV VTI:       0.197 m AV Peak Grad: 3.6 mmHg AV Mean Grad: 2.0 mmHg  AORTA Ao Root diam: 3.60 cm MITRAL VALVE               TRICUSPID VALVE MV Area (PHT): 2.36 cm    TR Peak grad:   10.8 mmHg MV Peak grad:  3.4 mmHg    TR Vmax:        164.00 cm/s MV Mean grad:  2.0 mmHg MV Vmax:       0.92 m/s    SHUNTS MV Vmean:      57.7 cm/s   Systemic Diam: 2.00 cm MV Decel Time: 321 msec MV E velocity: 58.20 cm/s MV A velocity: 88.00 cm/s MV E/A ratio:  0.66 Julien Nordmann MD Electronically signed by Julien Nordmann MD Signature Date/Time: 07/26/2022/3:15:32 PM    Final    MR BRAIN W WO CONTRAST  Result Date: 07/26/2022 CLINICAL DATA:  Follow-up intracranial hemorrhage. EXAM: MRI HEAD WITHOUT AND WITH CONTRAST TECHNIQUE: Multiplanar, multiecho pulse sequences of the brain and surrounding structures were obtained without and with intravenous contrast. CONTRAST:  8mL GADAVIST GADOBUTROL 1 MMOL/ML IV SOLN COMPARISON:  CT and CTA from yesterday. FINDINGS: Brain: Known acute hemorrhage in the left thalamus measuring up to 2 cm. Faint rim of enhancement which is likely reactive, no spot sign or vascular lesion seen underlying the hematoma on recent CTA. No underlying masslike findings. There is a small rim of edema. No intraventricular extension. Mild FLAIR hyperintensity in the cerebral white matter for age, attributed to chronic small vessel ischemia. There are chronic microhemorrhages primarily in the deep brain and best attributed to chronic hypertension. No hydrocephalus. Vascular: Major flow voids and vascular enhancements are preserved Skull and upper cervical spine: Normal marrow signal  Sinuses/Orbits: Negative IMPRESSION: 1. No mass or other unexpected finding underlying the patient's left thalamic hematoma. 2. Chronic small vessel ischemia with chronic microhemorrhages. Electronically Signed   By: Tiburcio Pea M.D.   On: 07/26/2022 07:55   CT HEAD WO CONTRAST ( )  Result Date: 07/26/2022 CLINICAL DATA:  Intracranial hemorrhage follow up EXAM: CT HEAD WITHOUT CONTRAST TECHNIQUE: Contiguous axial images were obtained from the base of the skull through the  vertex without intravenous contrast. RADIATION DOSE REDUCTION: This exam was performed according to the departmental dose-optimization program which includes automated exposure control, adjustment of the mA and/or kV according to patient size and/or use of iterative reconstruction technique. COMPARISON:  07/25/2022 at 6:57 p.m. FINDINGS: Brain: Left thalamic intraparenchymal hematoma has slightly increased in size to 2.3 x 1.6 x 1.4 cm (2.5 mL, previously 1.5 mL). The examination is otherwise unchanged. Vascular: No hyperdense vessel or unexpected calcification. Skull: Normal. Negative for fracture or focal lesion. Sinuses/Orbits: No acute finding. Other: None. IMPRESSION: Left thalamic intraparenchymal hematoma has slightly increased in size to 2.3 x 1.6 x 1.4 cm (2.5 mL, previously 1.5 mL). The examination is otherwise unchanged. Electronically Signed   By: Deatra Robinson M.D.   On: 07/26/2022 00:58   CT ANGIO HEAD CODE STROKE  Result Date: 07/25/2022 CLINICAL DATA:  Acute neurologic deficit.  Intracranial hemorrhage. EXAM: CT ANGIOGRAPHY HEAD TECHNIQUE: Multidetector CT imaging of the head was performed using the standard protocol during bolus administration of intravenous contrast. Multiplanar CT image reconstructions and MIPs were obtained to evaluate the vascular anatomy. RADIATION DOSE REDUCTION: This exam was performed according to the departmental dose-optimization program which includes automated exposure control, adjustment of  the mA and/or kV according to patient size and/or use of iterative reconstruction technique. CONTRAST:  75mL OMNIPAQUE IOHEXOL 350 MG/ML SOLN COMPARISON:  None Available. FINDINGS: POSTERIOR CIRCULATION: --Vertebral arteries: Normal --Inferior cerebellar arteries: Normal. --Basilar artery: Normal. --Superior cerebellar arteries: Normal. --Posterior cerebral arteries: Normal. ANTERIOR CIRCULATION: --Intracranial internal carotid arteries: Normal. --Anterior cerebral arteries (ACA): Normal. --Middle cerebral arteries (MCA): Normal. Venous sinuses: As permitted by contrast timing, patent. Anatomic variants: None Review of the MIP images confirms the above findings. IMPRESSION: 1. Normal CTA of the head.  No aneurysm or vascular malformation. 2. Unchanged appearance of left thalamic intraparenchymal hematoma, most consistent with hypertensive hemorrhage. Electronically Signed   By: Deatra Robinson M.D.   On: 07/25/2022 21:09   CT HEAD CODE STROKE WO CONTRAST  Result Date: 07/25/2022 CLINICAL DATA:  Code stroke.  Acute neurologic deficit EXAM: CT HEAD WITHOUT CONTRAST TECHNIQUE: Contiguous axial images were obtained from the base of the skull through the vertex without intravenous contrast. RADIATION DOSE REDUCTION: This exam was performed according to the departmental dose-optimization program which includes automated exposure control, adjustment of the mA and/or kV according to patient size and/or use of iterative reconstruction technique. COMPARISON:  None Available. FINDINGS: Brain: Acute intraparenchymal hemorrhage in the left thalamus measuring 1.8 x 1.4 x 1.2 cm (approximately 1.5 mL). Minimal edema. No midline shift or other mass effect. The size and configuration of the ventricles and extra-axial CSF spaces are normal. The brain parenchyma is normal, without evidence of acute or chronic infarction. Vascular: No abnormal hyperdensity of the major intracranial arteries or dural venous sinuses. No intracranial  atherosclerosis. Skull: The visualized skull base, calvarium and extracranial soft tissues are normal. Sinuses/Orbits: No fluid levels or advanced mucosal thickening of the visualized paranasal sinuses. No mastoid or middle ear effusion. The orbits are normal. ASPECTS ([place] Stroke Program Early CT Score) Not reported in the setting of acute hemorrhage. IMPRESSION: 1. Acute intraparenchymal hemorrhage in the left thalamus measuring 1.8 x 1.4 x 1.2 cm (approximately 1.5 mL). 2. No midline shift or other mass effect. Critical Value/emergent results were called by telephone at the time of interpretation on 07/25/2022 at 7:06 pm to provider [facility] , who verbally acknowledged these results. Electronically Signed   By: Chrisandra Netters.D.  On: 07/25/2022 19:07    Assessment/Plan: Diagnosis: ICH Does the need for close, 24 hr/day medical supervision in concert with the patient's rehab needs make it unreasonable for this patient to be served in a less intensive setting? Yes Co-Morbidities requiring supervision/potential complications:  1) Impaired balance: would benefit from CIR PT 2) Right sided inattention: would benefit from CIR OT 3) Right sided impaired dorsiflexion: may benefit from AFO consultation 4) Expressive aphasia 5) Tachypnea Due to bladder management, bowel management, safety, skin/wound care, disease management, medication administration, pain management, and patient education, does the patient require 24 hr/day rehab nursing? Yes Does the patient require coordinated care of a physician, rehab nurse, therapy disciplines of PT, OT, SLP to address physical and functional deficits in the context of the above medical diagnosis(es)? Yes Addressing deficits in the following areas: balance, endurance, locomotion, strength, transferring, bowel/bladder control, bathing, dressing, feeding, grooming, toileting, language, and psychosocial support Can the patient actively participate in an  intensive therapy program of at least 3 hrs of therapy per day at least 5 days per week? Yes The potential for patient to make measurable gains while on inpatient rehab is excellent Anticipated functional outcomes upon discharge from inpatient rehab are supervision  with PT, supervision with OT, supervision with SLP. Estimated rehab length of stay to reach the above functional goals is: 5-7 days Anticipated discharge destination: Home Overall Rehab/Functional Prognosis: excellent  POST ACUTE RECOMMENDATIONS: This patient's condition is appropriate for continued rehabilitative care in the following setting: CIR Patient has agreed to participate in recommended program. Yes Note that insurance prior authorization may be required for reimbursement for recommended care.   I have personally performed a face to face diagnostic evaluation of this patient. Additionally, I have examined the patient's medical record including any pertinent labs and radiographic images. If the physician assistant has documented in this note, I have reviewed and edited or otherwise concur with the physician assistant's documentation.  Thanks,  Horton Chin, MD 07/27/2022

## 2022-07-27 NOTE — Progress Notes (Signed)
? ?  Inpatient Rehab Admissions Coordinator : ? ?Per therapy recommendations, patient was screened for CIR candidacy by Jerri Glauser RN MSN.  At this time patient appears to be a potential candidate for CIR. I will place a rehab consult per protocol for full assessment. Please call me with any questions. ? ?Bodie Abernethy RN MSN ?Admissions Coordinator ?[phone] ?  ?

## 2022-07-27 NOTE — Progress Notes (Signed)
PHARMACY CONSULT NOTE  Pharmacy Consult for Electrolyte Monitoring and Replacement   Recent Labs: Potassium (mmol/L)  Date Value  07/27/2022 3.9   Magnesium (mg/dL)  Date Value  52/84/1324 2.4   Calcium (mg/dL)  Date Value  40/11/2723 8.8 (L)   Albumin (g/dL)  Date Value  36/64/4034 4.0  09/28/2020 3.9   Phosphorus (mg/dL)  Date Value  74/25/9563 2.9   Sodium (mmol/L)  Date Value  07/27/2022 139  09/28/2020 144     Assessment: 77 year old male admitted with ICH. Past medical history includes CAD, ulcerative colitis, HTN, HLD.   Goal of Therapy:  Electrolytes within normal limits  Plan:  No replacement warranted. Patient transferring out of PCCM care. Pharmacy will sign off of electrolytes and follow peripherally    Elliot Gurney, PharmD, BCPS Clinical Pharmacist  07/27/2022 1:00 PM

## 2022-07-27 NOTE — Evaluation (Signed)
Occupational Therapy Evaluation Patient Details Name: Billy Kim MRN: [id number] DOB: 1945/08/15 Today's Date: 07/27/2022   History of Present Illness Pt is a 77 y/o M admitted on 07/25/22 for acute small L thalamic hemorrhage in the setting of uncontrolled HTN. Repeat CT on 6/6 showed worsening hemorrhage. PMH: CAD, ulcerative colitis, HTN, HLD   Clinical Impression   Patient presenting with decreased Ind in self care,balance, functional mobility/transfer, endurance,and safety awareness. Patient reports living at home with wife and being Ind in all aspects of life. He still works full time in heating and air. Pt noted to have expressive difficulties needing increased time and difficulty with word finding during session. Min A for mobility in room but R inattention throughout session. Self care items placed on R side of sink to encourage visual scanning to the R but pt needing min cuing to locate and increased time to sequence self care tasks while standing at sink.  Patient currently functioning min A overall. Patient will benefit from acute OT to increase overall independence in the areas of ADLs, functional mobility, and safety awareness in order to safely discharge.      Recommendations for follow up therapy are one component of a multi-disciplinary discharge planning process, led by the attending physician.  Recommendations may be updated based on patient status, additional functional criteria and insurance authorization.   Assistance Recommended at Discharge Frequent or constant Supervision/Assistance  Patient can return home with the following A little help with walking and/or transfers;A lot of help with bathing/dressing/bathroom;Assistance with cooking/housework;Assist for transportation;Help with stairs or ramp for entrance;Direct supervision/assist for financial management;Direct supervision/assist for medications management    Functional Status Assessment  Patient has had a recent  decline in their functional status and demonstrates the ability to make significant improvements in function in a reasonable and predictable amount of time.  Equipment Recommendations  Other (comment) (defer to next venue of care)       Precautions / Restrictions Precautions Precautions: Fall Precaution Comments: R inattention, mild R hemi Restrictions Weight Bearing Restrictions: No      Mobility Bed Mobility               General bed mobility comments: seated in recliner chair at beginning/end of session    Transfers Overall transfer level: Needs assistance Equipment used: None Transfers: Sit to/from Stand Sit to Stand: Min assist                  Balance Overall balance assessment: Needs assistance Sitting-balance support: Feet supported Sitting balance-Leahy Scale: Fair     Standing balance support: During functional activity, No upper extremity supported Standing balance-Leahy Scale: Poor                             ADL either performed or assessed with clinical judgement   ADL Overall ADL's : Needs assistance/impaired     Grooming: Wash/dry face;Oral care;Min guard;Cueing for sequencing;Cueing for safety                       Toileting- Clothing Manipulation and Hygiene: Minimal assistance;Sit to/from stand Toileting - Clothing Manipulation Details (indicate cue type and reason): simulated             Vision Patient Visual Report: No change from baseline Vision Assessment?: Yes Ocular Range of Motion: Within Functional Limits Alignment/Gaze Preference: Within Defined Limits Tracking/Visual Pursuits: Able to track stimulus in all quads without  difficulty Convergence: Within functional limits Visual Fields: No apparent deficits Additional Comments: inattention to R side            Pertinent Vitals/Pain Pain Assessment Pain Assessment: No/denies pain     Hand Dominance Right   Extremity/Trunk Assessment Upper  Extremity Assessment Upper Extremity Assessment: RUE deficits/detail RUE Deficits / Details: decreased Inattiention RUE Coordination: decreased fine motor   Lower Extremity Assessment Lower Extremity Assessment: Defer to PT evaluation   Cervical / Trunk Assessment Cervical / Trunk Assessment: Normal   Communication Communication Communication: Expressive difficulties   Cognition Arousal/Alertness: Awake/alert Behavior During Therapy: WFL for tasks assessed/performed Overall Cognitive Status: Difficult to assess                                 General Comments: Pt follows simple commands throughout session with extra time PRN.     General Comments  Pt c/o slight lightheadedness after transferring to recliner, BP in RUE 158/87 mmHg MAP 107. Nurse notified of pt's BP & rechecked when walking with BP 127/100 mmHg - nurse cleared pt for ongoing participation in PT.            Home Living Family/patient expects to be discharged to:: Private residence Living Arrangements: Spouse/significant other Available Help at Discharge: Family;Available 24 hours/day Type of Home: House Home Access: Stairs to enter [employer] of Steps: 3 Entrance Stairs-Rails: Right;Left;Can reach both Home Layout: Two level;Able to live on main level with bedroom/bathroom     Bathroom Shower/Tub: Tub/shower unit         Home Equipment: None      Lives With: Spouse    Prior Functioning/Environment Prior Level of Function : Independent/Modified Independent;Working/employed;Driving             Mobility Comments: working full time in Health visitor Problem List: Decreased coordination;Decreased strength;Decreased activity tolerance;Decreased safety awareness;Impaired balance (sitting and/or standing);Decreased knowledge of use of DME or AE;Impaired UE functional use;Impaired vision/perception      OT Treatment/Interventions: Self-care/ADL  training;Therapeutic exercise;Therapeutic activities;Energy conservation;DME and/or AE instruction;Patient/family education;Balance training;Neuromuscular education;Visual/perceptual remediation/compensation    OT Goals(Current goals can be found in the care plan section) Acute Rehab OT Goals Patient Stated Goal: to get stronger and return to PLOF OT Goal Formulation: With patient/family Time For Goal Achievement: 08/10/22 Potential to Achieve Goals: Fair ADL Goals Pt Will Perform Grooming: with supervision;standing Pt Will Perform Lower Body Dressing: with supervision;sit to/from stand Pt Will Transfer to Toilet: with supervision;ambulating Pt Will Perform Toileting - Clothing Manipulation and hygiene: with supervision;sit to/from stand  OT Frequency: Min 3X/week       AM-PAC OT "6 Clicks" Daily Activity     Outcome Measure Help from another person eating meals?: A Little Help from another person taking care of personal grooming?: A Little Help from another person toileting, which includes using toliet, bedpan, or urinal?: A Little Help from another person bathing (including washing, rinsing, drying)?: A Little Help from another person to put on and taking off regular upper body clothing?: A Little Help from another person to put on and taking off regular lower body clothing?: A Little 6 Click Score: 18   End of Session Nurse Communication: Mobility status  Activity Tolerance: Patient tolerated treatment well Patient left: with call bell/phone within reach;in chair;with family/visitor present  OT Visit Diagnosis: Unsteadiness on feet (R26.81);Muscle weakness (generalized) (M62.81);Hemiplegia and  hemiparesis Hemiplegia - Right/Left: Right Hemiplegia - dominant/non-dominant: Dominant Hemiplegia - caused by:  (L thalamic hemorrhage)                Time: 1610-9604 OT Time Calculation (min): 24 min Charges:  OT General Charges $OT Visit: 1 Visit OT Evaluation $OT Eval Moderate  Complexity: 1 Mod OT Treatments $Self Care/Home Management : 8-22 mins  Jackquline Denmark, MS, OTR/L , CBIS ascom [phone]  07/27/22, 1:04 PM

## 2022-07-27 NOTE — Progress Notes (Signed)
PHARMACIST - PHYSICIAN COMMUNICATION  DR: Aundria Rud  CONCERNING: IV to Oral Route Change Policy  RECOMMENDATION: This patient is receiving pantoprazole by the intravenous route.  Based on criteria approved by the Pharmacy and Therapeutics Committee, the intravenous medication(s) is/are being converted to the equivalent oral dose form(s).   DESCRIPTION: These criteria include: The patient is eating (either orally or via tube) and/or has been taking other orally administered medications for a least 24 hours The patient has no evidence of active gastrointestinal bleeding or impaired GI absorption (gastrectomy, short bowel, patient on TNA or NPO).  If you have questions about this conversion, please contact the Pharmacy Department  []   [phone] )  [facility] [x]   [phone] )  [facility] []   [phone] )  [facility] []   [phone] )  [facility] []   [phone] )  [facility]    Elliot Gurney, PharmD, BCPS Clinical Pharmacist  07/27/2022 12:58 PM

## 2022-07-27 NOTE — Progress Notes (Signed)
Inpatient Rehabilitation Admissions Coordinator   I have left his wife a voicemail to call me to discus rehab venue options. Dr Carlis Abbott to also complete rehab consult this afternoon.  Ottie Glazier, RN, MSN Rehab Admissions Coordinator [phone] 07/27/2022 1:09 PM

## 2022-07-27 NOTE — Progress Notes (Signed)
NAME:  Billy Kim, MRN:  [id number], DOB:  12-11-1945, LOS: 2 ADMISSION DATE:  07/25/2022, CHIEF COMPLAINT:  dysarthria   History of Present Illness:   77 year old male presenting to [facility] ED from home on 07/25/2022 for evaluation as a code stroke.   History provided per bedside patient report, correlating spouse report bedside and chart review. Patient was in his normal state of health when he took a shower this evening around 16:50.  Upon getting out of the shower around 17:00 his wife noticed he seemed " off".  While attempting to get dressed he seemed to have difficulty putting on his socks, also some balance difficulties requiring him to hold onto the bed & when his son called to speak with him he was having difficulty " getting the words out".  She also thinks that she noticed some slight slurred speech.  The patient describes not feeling like himself and knowing something was wrong.  He denied headache, blurred vision, dizziness, sick symptoms or sick contacts.  He did endorse feeling off balance, having difficulty getting out what he wanted to say and possible weakness in bilateral lower extremities. Of note patient is being followed for hypertension.  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   ED course: Upon arrival patient worked up for code stroke.  Speech was documented to have improved after arrival to ED. initial vitals showed hypertension with BP 184/98. Initial NIH stroke scale documented as a 2 & imaging revealed acute small left thalamic bleed measuring 1.5 cm.  Medications given: Labetalol, IV contrast and clevidipine drip started     CT head without contrast 07/25/2022: Acute intraparenchymal hemorrhage in the left thalamus measuring 1.8 x 1.4 x 1.2 cm (approximately 1.5 mL). No midline shift or other mass effect. CT angio head code stroke 07/25/2022:  Normal CTA of the head.  No aneurysm or  vascular malformation. Unchanged appearance of left thalamic intraparenchymal hematoma, most consistent with hypertensive hemorrhage.   PCCM consulted for admission due to acute small left thalamic hemorrhage in the setting of uncontrolled hypertension requiring clevidipine drip.  Pertinent  Medical History  CAD (with chronic total occlusions of D1 & non-dominant RCA as well as moderate proximal LAD dx- medical management) Ulcerative Colitis (remission for years) HTN HLD (hypertriglyceridemia)  Significant Hospital Events: Including procedures, antibiotic start and stop dates in addition to other pertinent events   07/25/22: Admit to ICU with acute small left thalamic hemorrhage in the setting of uncontrolled hypertension requiring clevidipine drip. 6/6 repeat CT head shows worsening thalamic hemorrhage, remains on clevidipine 6/7 blood pressure under control, neuro checks unchanged  Interim History / Subjective:  Feels well, eating breakfast this morning. Denies weakness. Still feels speech is slow and difficult  Objective   Blood pressure 133/79, pulse 68, temperature 98.2 F (36.8 C), resp. rate 18, height 5\' 8"  (1.727 m), weight 93.5 kg, SpO2 95 %.        Intake/Output Summary (Last 24 hours) at 07/27/2022 1412 Last data filed at 07/27/2022 1029 Gross per 24 hour  Intake 798.81 ml  Output 1275 ml  Net -476.19 ml   Filed Weights   07/26/22 0500  Weight: 93.5 kg    Examination: Physical Exam Constitutional:      General: He is not in acute distress.    Appearance: Normal appearance. He is not ill-appearing.  HENT:  Nose: Nose normal.     Mouth/Throat:     Mouth: Mucous membranes are dry.  Eyes:     General: No visual field deficit. Cardiovascular:     Rate and Rhythm: Normal rate and regular rhythm.     Pulses: Normal pulses.     Heart sounds: Normal heart sounds.  Pulmonary:     Effort: Pulmonary effort is normal.     Breath sounds: Normal breath sounds.   Abdominal:     Palpations: Abdomen is soft.  Neurological:     Mental Status: He is alert.     GCS: GCS eye subscore is 4. GCS verbal subscore is 5. GCS motor subscore is 6.     Cranial Nerves: Cranial nerves 2-12 are intact. No cranial nerve deficit, dysarthria or facial asymmetry.     Sensory: Sensation is intact. No sensory deficit.     Motor: Motor function is intact. No weakness, tremor, atrophy, abnormal muscle tone or seizure activity.     Coordination: Coordination is intact. Coordination normal. Finger-Nose-Finger Test and Heel to [facility] Test normal. Rapid alternating movements normal.     Deep Tendon Reflexes: Reflexes are normal and symmetric.     Assessment & Plan:   Neurology #Hemorrhagic Stroke #Left Thalamic Hemorrhage  Presented with dysarthria and word finding difficult, imaging showed a small left thalamic bleed, likely secondary to hypertensive emergency. BP now under control, will switch neuro checks to every 4 hours and then every shift.  -HOB elevation at 30 degrees -continue neurochecks, decrease frequency -hold anti-coagulation -blood pressure control per CV section -stat head CT with any change in neurological status -PT/OT  Cardiovascular #Hypertensive Emergency #CAD  Goal SBP < 150 mmHg. Clevidipine gtt weaned off, now on home anti-hypertensive agents with Carvedilol, Losartan, and Imdur. Added IV Hydralazine PRN to maintain tight BP control  -continue cardiac monitoring -TTE with only grade I diastolic dysfunction  Pulmonary On room air, no active issues  Gastrointestinal Diet resumed after evaluation with SLP  Renal #AKI  AKI on presentation. Losartan was resumed yesterday, will monitor kidney function for any decline. If worsened might need to switch to other agent  Endocrine ICU glycemic protocol  Hem/Onc Holding anti-coagulation given bleed. SCD for prophylaxis  ID No active issues    Best Practice (right click and "Reselect  all SmartList Selections" daily)   Diet/type: Regular consistency (see orders) DVT prophylaxis: SCD GI prophylaxis: N/A Lines: N/A Foley:  N/A Code Status:  full code Last date of multidisciplinary goals of care discussion [07/27/2022]  Labs   CBC: Recent Labs  Lab 07/25/22 1849 07/26/22 0300  WBC 5.6 6.9  NEUTROABS 3.6  --   HGB 14.8 17.4*  HCT 42.3 48.8  MCV 86.7 84.6  PLT 125* 117*    Basic Metabolic Panel: Recent Labs  Lab 07/25/22 1849 07/26/22 0300 07/27/22 0833  NA 140 140 139  K 4.3 4.4 3.9  CL 106 107 107  CO2 26 24 25   GLUCOSE 107* 110* 112*  BUN 26* 22 28*  CREATININE 1.42* 1.15 1.46*  CALCIUM 8.8* 8.8* 8.8*  MG  --  2.4  --   PHOS  --  2.9  --    GFR: Estimated Creatinine Clearance: 46.2 mL/min (A) (by C-G formula based on SCr of 1.46 mg/dL (H)). Recent Labs  Lab 07/25/22 1849 07/26/22 0300  WBC 5.6 6.9    Liver Function Tests: Recent Labs  Lab 07/25/22 1849  AST 24  ALT 18  ALKPHOS 65  BILITOT 2.4*  PROT 6.8  ALBUMIN 4.0   No results for input(s): "LIPASE", "AMYLASE" in the last 168 hours. No results for input(s): "AMMONIA" in the last 168 hours.  ABG No results found for: "PHART", "PCO2ART", "PO2ART", "HCO3", "TCO2", "ACIDBASEDEF", "O2SAT"   Coagulation Profile: Recent Labs  Lab 07/25/22 1849 07/26/22 0300  INR 1.2 1.1    Cardiac Enzymes: No results for input(s): "CKTOTAL", "CKMB", "CKMBINDEX", "TROPONINI" in the last 168 hours.  HbA1C: Hgb A1c MFr Bld  Date/Time Value Ref Range Status  07/26/2022 03:00 AM 5.3 4.8 - 5.6 % Final    Comment:    (NOTE)         Prediabetes: 5.7 - 6.4         Diabetes: >6.4         Glycemic control for adults with diabetes: <7.0     CBG: Recent Labs  Lab 07/26/22 1932 07/26/22 2345 07/27/22 0412 07/27/22 0715 07/27/22 1103  GLUCAP 127* 130* 122* 123* 145*    Past Medical History:  He,  has a past medical history of Coronary artery disease (01/2020), HLD (hyperlipidemia),  Hypertension, and Ulcerative colitis (5/81).   Surgical History:   Past Surgical History:  Procedure Laterality Date   CARDIAC CATHETERIZATION  03/20/01   Cardiolite EF 55% 02/10/02   CHOLECYSTECTOMY N/A 07/09/2017   Procedure: LAPAROSCOPIC CHOLECYSTECTOMY;  Surgeon: Jimmye Norman, MD;  Location: [facility] OR;  Service: General;  Laterality: N/A;   COLONOSCOPY  multiple   ENDOSCOPIC RETROGRADE CHOLANGIOPANCREATOGRAPHY (ERCP) WITH PROPOFOL N/A 07/08/2017   Procedure: ENDOSCOPIC RETROGRADE CHOLANGIOPANCREATOGRAPHY (ERCP) WITH PROPOFOL;  Surgeon: Lynann Bologna, MD;  Location: [facility] ENDOSCOPY;  Service: Endoscopy;  Laterality: N/A;   INGUINAL HERNIA REPAIR  04/09/06   Bilateral   LAPAROSCOPIC APPENDECTOMY  03/1981   LEFT HEART CATH AND CORONARY ANGIOGRAPHY Left 12/11/2019   Procedure: LEFT HEART CATH AND CORONARY ANGIOGRAPHY;  Surgeon: Yvonne Kendall, MD;  Location: [facility] INVASIVE CV LAB;  Service: Cardiovascular;  Laterality: Left;   REMOVAL OF STONES  07/08/2017   Procedure: REMOVAL OF STONES;  Surgeon: Lynann Bologna, MD;  Location: [facility] ENDOSCOPY;  Service: Endoscopy;;   SPHINCTEROTOMY  07/08/2017   Procedure: Dennison Mascot;  Surgeon: Lynann Bologna, MD;  Location: [facility] ENDOSCOPY;  Service: Endoscopy;;     Social History:   reports that he has never smoked. He has never used smokeless tobacco. He reports that he does not drink alcohol and does not use drugs.   Family History:  His family history includes Bladder Cancer in his nephew; Bladder Cancer (age of onset: 87) in his brother; Bone cancer in his paternal grandfather; CAD in his father; CAD (age of onset: 103) in his brother; Cancer in his mother; Hypertension in his brother, brother, brother, brother, brother, sister, and son; Prostate cancer in his brother and nephew; Stroke in his father. There is no history of Colon cancer, Esophageal cancer, Rectal cancer, or Stomach cancer.   Allergies Allergies  Allergen Reactions   Amoxicillin Other (See  Comments)    Caused headache Has patient had a PCN reaction causing immediate rash, facial/tongue/throat swelling, SOB or lightheadedness with hypotension: No Has patient had a PCN reaction causing severe rash involving mucus membranes or skin necrosis: No Has patient had a PCN reaction that required hospitalization: No Has patient had a PCN reaction occurring within the last 10 years: No If all of the above answers are "NO", then may proceed with Cephalosporin use.   Lyrica [Pregabalin]     Intolerant, worsening headache  Morphine Sulfate Other (See Comments)    headache   Neurontin [Gabapentin] Other (See Comments)    headache   Sulfonamide Derivatives Other (See Comments)    headache     Home Medications  Prior to Admission medications   Medication Sig Start Date End Date Taking? Authorizing Provider  Ascorbic Acid (VITAMIN C) 1000 MG tablet Take 1,000 mg by mouth daily.   Yes [provider]  aspirin EC 81 MG tablet Take 1 tablet (81 mg total) by mouth daily. Swallow whole. 12/09/19  Yes End, Cristal Deer, MD  benzonatate (TESSALON) 200 MG capsule Take 1 capsule (200 mg total) by mouth 3 (three) times daily as needed for cough. 05/16/22  Yes Doreene Nest, NP  carvedilol (COREG) 3.125 MG tablet TAKE 1 TABLET(3.125 MG) BY MOUTH TWICE DAILY Patient taking differently: Take 3.125 mg by mouth 2 (two) times daily with a meal. 12/11/21  Yes Joaquim Nam, MD  cholecalciferol (VITAMIN D3) 25 MCG (1000 UNIT) tablet Take 2,000 Units by mouth daily.    Yes [provider]  Coenzyme Q10 100 MG capsule Take 100 mg by mouth daily.   Yes [provider]  ezetimibe (ZETIA) 10 MG tablet Take 1 tablet (10 mg total) by mouth daily. 04/11/22 04/06/23 Yes End, Cristal Deer, MD  fluticasone (FLONASE) 50 MCG/ACT nasal spray Place 2 sprays into both nostrils daily. 05/16/22  Yes Doreene Nest, NP  Garlic 500 MG TABS Take 500 mg by mouth daily.   Yes [provider]  icosapent Ethyl (VASCEPA) 1 g capsule Take 2 capsules (2 g total) by mouth 2 (two) times daily. 06/29/21  Yes End, Cristal Deer, MD  isosorbide mononitrate (IMDUR) 120 MG 24 hr tablet TAKE 1 TABLET(120 MG) BY MOUTH DAILY Patient taking differently: Take 120 mg by mouth daily. 06/08/22  Yes End, Cristal Deer, MD  losartan (COZAAR) 50 MG tablet Take 1 tablet (50 mg total) by mouth daily. 01/18/22 01/13/23 Yes End, Cristal Deer, MD  meclizine (ANTIVERT) 25 MG tablet Take 0.5-1 tablet (12.5 mg- 25 mg) by mouth every 6 hours as needed for dizziness 11/02/21  Yes End, Cristal Deer, MD  nitroGLYCERIN (NITROSTAT) 0.4 MG SL tablet Place 1 tablet (0.4 mg total) under the tongue every 5 (five) minutes as needed for chest pain. Maximum of 3 doses. 08/29/21  Yes End, Cristal Deer, MD  omeprazole (PRILOSEC) 20 MG capsule Take 20 mg by mouth daily as needed.   Yes [provider]  famotidine (PEPCID) 20 MG tablet Take 1 tablet (20 mg total) by mouth 2 (two) times daily as needed for heartburn or indigestion. Patient not taking: Reported on 07/25/2022 04/29/18   Joaquim Nam, MD  tiZANidine (ZANAFLEX) 4 MG tablet Take 0.5-1 tablets (2-4 mg total) by mouth every 8 (eight) hours as needed for muscle spasms (sedation caution). Patient not taking: Reported on 07/25/2022 06/06/21   Joaquim Nam, MD     Critical care time: 30 minutes    Raechel Chute, MD [facility] 07/27/2022 2:22 PM

## 2022-07-27 NOTE — Progress Notes (Signed)
Neurology Progress Note  Subjective: Patient slightly weaker on R today after working with PT but repeat head CT stable  Exam: Vitals:   07/27/22 1400 07/27/22 1607  BP: 105/88 95/70  Pulse: 74 76  Resp: 19 20  Temp:  97.7 F (36.5 C)  SpO2: 96% 95%   Physical Exam Gen: A&O x4, NAD Resp: normal WOB CV: extremities appear well-perfused   Neuro: *MS: A&O x4. Follows multi-step commands.  *Speech: minimal dysarthria, mild aphasia, able to name and repeat *CN: PERRL 3mm, EOMI, VFF by confrontation, sensation intact, R UMN facial droop, hearing intact to voice *Motor:   Normal bulk.  No tremor, rigidity or bradykinesia. No pronator drift. 4+/5 RUE and RLE, full strength on L *Sensory: SILT. Symmetric. Mild R hemineglect *Coordination:  Finger-to-nose, heel-to-shin, rapid alternating motions were intact. *Reflexes:  2+ symm, toes down bilat *Gait: deferred   NIHSS = 6  mRS = 0  ICH score = 0  Data:  Head CT in ED 1. Acute intraparenchymal hemorrhage in the left thalamus measuring 1.8 x 1.4 x 1.2 cm (approximately 1.5 mL). 2. No midline shift or other mass effect.  Head CT 6 hrs later Left thalamic intraparenchymal hematoma has slightly increased in size to 2.3 x 1.6 x 1.4 cm (2.5 mL, previously 1.5 mL). The examination is otherwise unchanged.  Repeat head CT 7/7 - stable  CTA head 1. Normal CTA of the head.  No aneurysm or vascular malformation. 2. Unchanged appearance of left thalamic intraparenchymal hematoma, most consistent with hypertensive hemorrhage.  MRI brain wwo 1. No mass or other unexpected finding underlying the patient's left thalamic hematoma. 2. Chronic small vessel ischemia with chronic microhemorrhages.  CNS imaging personally reviewed; I agree with above interpretations  Impression: This is a 77 yo man with hx CAD, HL, HTN, UC who presents with word finding difficulty and balance issues and was found to have a small L thalamic hemorrhage,  likely hypertensive in etiology. He is at BP goal on oral meds. He seemed slightly weaker today on the R today after working with PT but head CT was stable.  Recommendations: - Goal SBP <150 - BP at goal on oral meds, OK to move patient to floor - SCDs for DVT prophylaxis - HOB elevated 30 degrees - Neurochecks and vitals per unit routine - No antiplatelets or anticoagulation in setting of acute ICH  Will continue to follow   Bing Neighbors, MD [facility] [phone]  If 7pm- 7am, please page neurology on call as listed in [facility].

## 2022-07-27 NOTE — PMR Pre-admission (Signed)
PMR Admission Coordinator Pre-Admission Assessment  Patient: Billy Kim is an 77 y.o., male MRN: [id number] DOB: 02-15-1946 Height: 5\' 8"  (172.7 cm) Weight: 93.5 kg              Insurance Information HMO:     PPO: yes     PCP:      IPA:      80/20:      OTHER:  PRIMARY: [employer]      Policy#: [id number]      Subscriber: pt CM Name: ***      Phone#: ***     Fax#: [phone] Pre-Cert#: [id number]***      Employer:  Benefits:  Phone #: [phone]     Name: 6/7 Eff. Date: 02/19/22     Deduct: none      Out of Pocket Max: $3200        CIR: $295 co pay per day days 1 until 6      SNF: no copay per day days 1 until 20; $203 co pay per day days 21 until 100 Outpatient: $15 per visit     Co-Pay: visits per medical neccesity Home Health: 100%      Co-Pay:  DME: 80%     Co-Pay: 20% Providers: in network  SECONDARY: none  Financial Counselor:       Phone#:   The Data processing manager" for patients in Inpatient Rehabilitation Facilities with attached "Privacy Act Statement-Health Care Records" was provided and verbally reviewed with: Patient and Family  Emergency Contact Information Contact Information     Name Relation Home Work Mobile   Kim,Billy Spouse [phone]   [name], [name] [phone]        Current Medical History  Patient Admitting Diagnosis: CVA  History of Present Illness: 77 year old male with history of CAD, HLD, HTN, ulcerative colitiswho [facility]ente [facility] with word finidng diffuclty and balance issues. Noted slight right facial droop.   Imaging reveweeld left thalamic hemorrhage. No history of trauma. Normal CTA of head. Admitted to ICU in the setting of uncontrolled HTN requiring celviprex drip. 6/6 repeat CT head shows worsenign thalamic hemorrhage. TTE wih only grade 1 diastolic dysfunction. AKI on presentation. Losartan resumed and to monitor kidney funcion. Holding anicoagulation due to  ICH. SCD for prophylaxis.   Complete NIHSS TOTAL: 2 Glasgow Coma Scale Score: 15  Patient's medical record from [facility] has been reviewed by the rehabilitation admission coordinator and physician.  Past Medical History  Past Medical History:  Diagnosis Date   Coronary artery disease 01/2020   Moderate proximal LAD disease (not hemodynamically significant) and CTO's of D1 and non-dominant RCA   HLD (hyperlipidemia)    Hypertension    Ulcerative colitis 5/81   Remission for years   Has the patient had major surgery during 100 days prior to admission? No  Family History  family history includes Bladder Cancer in his nephew; Bladder Cancer (age of onset: 45) in his brother; Bone cancer in his paternal grandfather; CAD in his father; CAD (age of onset: 74) in his brother; Cancer in his mother; Hypertension in his brother, brother, brother, brother, brother, sister, and son; Prostate cancer in his brother and nephew; Stroke in his father.  Current Medications   Current Facility-Administered Medications:     stroke: early stages of recovery book, , Does not apply, Once, Jefferson Fuel, MD   acetaminophen (TYLENOL) tablet 650 mg, 650 mg, Oral, Q4H PRN **OR** acetaminophen (  TYLENOL) 160 MG/5ML solution 650 mg, 650 mg, Per Tube, Q4H PRN **OR** acetaminophen (TYLENOL) suppository 650 mg, 650 mg, Rectal, Q4H PRN, Jefferson Fuel, MD   carvedilol (COREG) tablet 3.125 mg, 3.125 mg, Oral, BID WC, Jaynie Bream, RPH, 3.125 mg at 07/27/22 0801   Chlorhexidine Gluconate Cloth 2 % PADS 6 each, 6 each, Topical, QHS, Kasa, Wallis Bamberg, MD, 6 each at 07/25/22 2044   docusate sodium (COLACE) capsule 100 mg, 100 mg, Oral, BID PRN, Rust-Chester, Cecelia Byars, NP   ezetimibe (ZETIA) tablet 10 mg, 10 mg, Oral, Daily, Jaynie Bream, RPH, 10 mg at 07/27/22 1111   hydrALAZINE (APRESOLINE) injection 10 mg, 10 mg, Intravenous, Q2H PRN, Raechel Chute, MD   icosapent Ethyl (VASCEPA) 1 g capsule 2 g, 2 g, Oral, BID,  Jaynie Bream, RPH, 2 g at 07/27/22 1110   isosorbide mononitrate (IMDUR) 24 hr tablet 120 mg, 120 mg, Oral, Daily, Jaynie Bream, RPH, 120 mg at 07/27/22 1107   losartan (COZAAR) tablet 50 mg, 50 mg, Oral, Daily, Jaynie Bream, RPH, 50 mg at 07/27/22 1106   pantoprazole (PROTONIX) EC tablet 40 mg, 40 mg, Oral, Daily, Jaynie Bream, RPH   polyethylene glycol (MIRALAX / GLYCOLAX) packet 17 g, 17 g, Oral, Daily PRN, Rust-Chester, Cecelia Byars, NP   senna-docusate (Senokot-S) tablet 1 tablet, 1 tablet, Oral, BID, Jefferson Fuel, MD, 1 tablet at 07/27/22 1107  Patients Current Diet:  Diet Order             Diet regular Room service appropriate? Yes with Assist; Fluid consistency: Thin  Diet effective now                  Precautions / Restrictions Precautions Precautions: Fall Precaution Comments: R inattention, mild R hemi Restrictions Weight Bearing Restrictions: No   Has the patient had 2 or more falls or a fall with injury in the past year?No  Prior Activity Level Community (5-7x/wk): Independent, working and driving  Prior Functional Level Prior Function Prior Level of Function : Independent/Modified Independent, Working/employed, Driving Mobility Comments: working full time in [employer]  Self Care: Did the patient need help bathing, dressing, using the toilet or eating?  Independent  Indoor Mobility: Did the patient need assistance with walking from room to room (with or without device)? Independent  Stairs: Did the patient need assistance with internal or external stairs (with or without device)? Independent  Functional Cognition: Did the patient need help planning regular tasks such as shopping or remembering to take medications? Independent  Patient Information    Patient's Response To:     Home Assistive Devices / Equipment Home Assistive Devices/Equipment: Eyeglasses Home Equipment: None  Prior Device Use: Indicate devices/aids used  by the patient prior to current illness, exacerbation or injury? None of the above  Current Functional Level Cognition  Arousal/Alertness: Awake/alert Overall Cognitive Status: Difficult to assess Difficult to assess due to: Impaired communication (expressive deficits) Orientation Level: Oriented X4 General Comments: Pt follows simple commands throughout session with extra time PRN.    Extremity Assessment (includes Sensation/Coordination)  Upper Extremity Assessment: RUE deficits/detail RUE Deficits / Details: decreased Inattiention RUE Coordination: decreased fine motor  Lower Extremity Assessment: Defer to PT evaluation    ADLs  Overall ADL's : Needs assistance/impaired Grooming: Wash/dry face, Oral care, Min guard, Cueing for sequencing, Cueing for safety Toileting- Clothing Manipulation and Hygiene: Minimal assistance, Sit to/from stand Toileting - Clothing Manipulation Details (indicate cue type and reason):  simulated    Mobility  Overal bed mobility: Needs Assistance Bed Mobility: Supine to Sit Supine to sit: Supervision, Min guard, HOB elevated General bed mobility comments: seated in recliner chair at beginning/end of session    Transfers  Overall transfer level: Needs assistance Equipment used: None Transfers: Sit to/from Stand Sit to Stand: Min assist General transfer comment: STS from EOB, recliner without AD    Ambulation / Gait / Stairs / Wheelchair Mobility  Ambulation/Gait Ambulation/Gait assistance: Editor, commissioning (Feet): 100 Feet Assistive device: None Gait Pattern/deviations: Decreased stride length, Decreased dorsiflexion - right, Decreased step length - right, Decreased weight shift to left General Gait Details: decreased foot clearance RLE. PT provides cuing for corrected gait pattern with RLE with pt only able to demonstrate x 1 step,  otherwise reverts back to impaired gait pattern as noted above. Pt with R inattention, bumping doorway on R  side x 2. Gait velocity: decreased    Posture / Balance Balance Overall balance assessment: Needs assistance Sitting-balance support: Feet supported Sitting balance-Leahy Scale: Fair Standing balance support: During functional activity, No upper extremity supported Standing balance-Leahy Scale: Poor    Special needs/care consideration      Previous Home Environment  Living Arrangements: Spouse/significant other  Lives With: Spouse Available Help at Discharge: Family, Available 24 hours/day Type of Home: House Home Layout: Two level, Able to live on main level with bedroom/bathroom Home Access: Stairs to enter Entrance Stairs-Rails: Right, Left, Can reach both Entrance Stairs-Number of Steps: 3 to 5 Bathroom Shower/Tub: Engineer, manufacturing systems: Standard How Accessible: Accessible via walker Home Care Services: No  Discharge Living Setting Plans for Discharge Living Setting: Patient's home, Lives with (comment) (wife) Type of Home at Discharge: House Discharge Home Layout: Two level, Able to live on main level with bedroom/bathroom Discharge Home Access: Stairs to enter Entrance Stairs-Rails: Right, Left, Can reach both Entrance Stairs-Number of Steps: 3 Discharge Bathroom Shower/Tub: Tub/shower unit Discharge Bathroom Toilet: Standard Discharge Bathroom Accessibility: Yes How Accessible: Accessible via walker Does the patient have any problems obtaining your medications?: No  Social/Family/Support Systems Patient Roles: Spouse (employee) Contact Information: wife, Clinical cytogeneticist Anticipated Caregiver: wife Anticipated Industrial/product designer Information: see contacts Ability/Limitations of Caregiver: no limitations Caregiver Availability: 24/7 Discharge Plan Discussed with Primary Caregiver: Yes Is Caregiver In Agreement with Plan?: Yes Does Caregiver/Family have Issues with Lodging/Transportation while Pt is in Rehab?: No  Goals Patient/Family Goal for Rehab:  supervision with PT, OT and SLP Expected length of stay: ELOS 5 to 7 days Pt/Family Agrees to Admission and willing to participate: Yes Program Orientation Provided & Reviewed with Pt/Caregiver Including Roles  & Responsibilities: Yes  Decrease burden of Care through IP rehab admission: n/a  Possible need for SNF placement upon discharge:not anticapted  Patient Condition: This patient's medical and functional status has changed since the consult dated: 07/27/22 in which the Rehabilitation Physician determined and documented that the patient's condition is appropriate for intensive rehabilitative care in an inpatient rehabilitation facility. See "History of Present Illness" (above) for medical update. Functional changes are: ***. Patient's medical and functional status update has been discussed with the Rehabilitation physician and patient remains appropriate for inpatient rehabilitation. Will admit to inpatient rehab today.  Preadmission Screen Completed By:  Clois Dupes, RN MSN, 07/27/2022 3:16 PM ______________________________________________________________________   Discussed status with Dr. Marland Kitchenon***at *** and received approval for admission today.  Admission Coordinator:  Clois Dupes RN MSN time***/Date***

## 2022-07-27 NOTE — Evaluation (Signed)
Physical Therapy Evaluation Patient Details Name: Billy Kim MRN: [id number] DOB: 1945/05/08 Today's Date: 07/27/2022  History of Present Illness  Pt is a 77 y/o M admitted on 07/25/22 for acute small L thalamic hemorrhage in the setting of uncontrolled HTN. Repeat CT on 6/6 showed worsening hemorrhage. PMH: CAD, ulcerative colitis, HTN, HLD  Clinical Impression  Pt seen for PT evaluation with pt agreeable to tx, wife Engineer, maintenance) present for session. Prior to admission pt was independent, working in [employer], and driving. On this date, pt presents with expressive communication deficits, R inattention, R hemiparesis, and impaired balance & gait. Pt requires min assist to ambulate and decreased ability to correct gait pattern to reduce fall risk. Pt with R inattention, bumping doorway x 2 despite PT educating throughout session. Pt would benefit from maximum therapy services to maximize mobility & facilitate return to PLOF, as pt is eager to return to work if possible.    Recommendations for follow up therapy are one component of a multi-disciplinary discharge planning process, led by the attending physician.  Recommendations may be updated based on patient status, additional functional criteria and insurance authorization.  Follow Up Recommendations       Assistance Recommended at Discharge Intermittent Supervision/Assistance  Patient can return home with the following  A little help with walking and/or transfers;A little help with bathing/dressing/bathroom;Assistance with cooking/housework;Assist for transportation;Help with stairs or ramp for entrance    Equipment Recommendations None recommended by PT (TBD)  Recommendations for Other Services       Functional Status Assessment Patient has had a recent decline in their functional status and demonstrates the ability to make significant improvements in function in a reasonable and predictable amount of time.     Precautions / Restrictions  Precautions Precautions: Fall Precaution Comments: R inattention, mild R hemi Restrictions Weight Bearing Restrictions: No      Mobility  Bed Mobility Overal bed mobility: Needs Assistance Bed Mobility: Supine to Sit     Supine to sit: Supervision, Min guard, HOB elevated     General bed mobility comments: extra time for supine>sit with HOB elevated    Transfers Overall transfer level: Needs assistance Equipment used: None Transfers: Sit to/from Stand Sit to Stand: Min assist           General transfer comment: STS from EOB, recliner without AD    Ambulation/Gait Ambulation/Gait assistance: Min assist Gait Distance (Feet): 100 Feet Assistive device: None Gait Pattern/deviations: Decreased stride length, Decreased dorsiflexion - right, Decreased step length - right, Decreased weight shift to left Gait velocity: decreased     General Gait Details: decreased foot clearance RLE. PT provides cuing for corrected gait pattern with RLE with pt only able to demonstrate x 1 step,  otherwise reverts back to impaired gait pattern as noted above. Pt with R inattention, bumping doorway on R side x 2.  Stairs            Wheelchair Mobility    Modified Rankin (Stroke Patients Only)       Balance Overall balance assessment: Needs assistance Sitting-balance support: Feet supported Sitting balance-Leahy Scale: Fair     Standing balance support: During functional activity, No upper extremity supported Standing balance-Leahy Scale: Poor                               Pertinent Vitals/Pain Pain Assessment Pain Assessment: No/denies pain    Home Living Family/patient expects to be  discharged to:: Private residence Living Arrangements: Spouse/significant other Available Help at Discharge: Family;Available 24 hours/day Type of Home: House Home Access: Stairs to enter Entrance Stairs-Rails: Right;Left;Can reach both Entrance Stairs-Number of Steps: 3    Home Layout: Two level;Able to live on main level with bedroom/bathroom Home Equipment: Other (comment)      Prior Function Prior Level of Function : Independent/Modified Independent;Working/employed;Driving             Mobility Comments: working full time in [employer]   Dominant Hand: Right    Extremity/Trunk Assessment   Upper Extremity Assessment Upper Extremity Assessment: Generalized weakness (pt with decreased attention to RUE)    Lower Extremity Assessment Lower Extremity Assessment: Generalized weakness (pt with decreased attention to RLE)    Cervical / Trunk Assessment Cervical / Trunk Assessment: Normal  Communication   Communication: Expressive difficulties  Cognition Arousal/Alertness: Awake/alert Behavior During Therapy: WFL for tasks assessed/performed Overall Cognitive Status: Difficult to assess                                 General Comments: Pt follows simple commands throughout session with extra time PRN.        General Comments General comments (skin integrity, edema, etc.): Pt c/o slight lightheadedness after transferring to recliner, BP in RUE 158/87 mmHg MAP 107. Nurse notified of pt's BP & rechecked when walking with BP 127/100 mmHg - nurse cleared pt for ongoing participation in PT.    Exercises Other Exercises Other Exercises: Educated pt & wife on pt's R sided deficits (weakness & inattention). Educated pt's wife on use of gait belt & assisting pt with ambulating (stand on pt's R side). Wife assisted pt with gait without AD with gait belt with supervision from PT.   Assessment/Plan    PT Assessment Patient needs continued PT services  PT Problem List Decreased strength;Decreased coordination;Decreased activity tolerance;Decreased range of motion;Decreased balance;Decreased mobility;Decreased safety awareness;Decreased knowledge of use of DME       PT Treatment Interventions DME  instruction;Therapeutic exercise;Gait training;Balance training;Stair training;Neuromuscular re-education;Functional mobility training;Therapeutic activities;Patient/family education    PT Goals (Current goals can be found in the Care Plan section)  Acute Rehab PT Goals Patient Stated Goal: get better, return to working PT Goal Formulation: With patient/family Time For Goal Achievement: 08/10/22 Potential to Achieve Goals: Good    Frequency Min 4X/week     Co-evaluation               AM-PAC PT "6 Clicks" Mobility  Outcome Measure Help needed turning from your back to your side while in a flat bed without using bedrails?: None Help needed moving from lying on your back to sitting on the side of a flat bed without using bedrails?: A Little Help needed moving to and from a bed to a chair (including a wheelchair)?: A Little Help needed standing up from a chair using your arms (e.g., wheelchair or bedside chair)?: A Little Help needed to walk in hospital room?: A Little Help needed climbing 3-5 steps with a railing? : A Little 6 Click Score: 19    End of Session Equipment Utilized During Treatment: Gait belt Activity Tolerance: Patient tolerated treatment well Patient left: in chair;with family/visitor present (in handoff to OT) Nurse Communication: Mobility status (BP) PT Visit Diagnosis: Hemiplegia and hemiparesis Hemiplegia - Right/Left: Right Hemiplegia - dominant/non-dominant: Dominant Hemiplegia - caused  by: Nontraumatic intracerebral hemorrhage    Time: 1610-9604 PT Time Calculation (min) (ACUTE ONLY): 28 min   Charges:   PT Evaluation $PT Eval Moderate Complexity: 1 Mod PT Treatments $Therapeutic Activity: 8-22 mins        Aleda Grana, PT, DPT 07/27/22, 11:28 AM   Sandi Mariscal 07/27/2022, 11:26 AM

## 2022-07-28 DIAGNOSIS — I610 Nontraumatic intracerebral hemorrhage in hemisphere, subcortical: Secondary | ICD-10-CM | POA: Diagnosis not present

## 2022-07-28 LAB — GLUCOSE, CAPILLARY
Glucose-Capillary: 112 mg/dL — ABNORMAL HIGH (ref 70–99)
Glucose-Capillary: 111 mg/dL — ABNORMAL HIGH (ref 70–99)
Glucose-Capillary: 135 mg/dL — ABNORMAL HIGH (ref 70–99)

## 2022-07-28 MED ORDER — ENOXAPARIN SODIUM 40 MG/0.4ML IJ SOSY
40.0000 mg | PREFILLED_SYRINGE | INTRAMUSCULAR | Status: DC
  Filled 2022-07-28: qty 0.4

## 2022-07-28 NOTE — Progress Notes (Signed)
Patient transferred to CT via bed with RN in stable condition.

## 2022-07-28 NOTE — Progress Notes (Signed)
Progress Note   Patient: Billy Kim ZOX:[id number] DOB: 12-16-1945 DOA: 07/25/2022     3 DOS: the patient was seen and examined on 07/28/2022   Brief hospital course:  77 year old male presenting to [facility] ED from home on 07/25/2022 for evaluation as a code stroke.   History provided per bedside patient report, correlating spouse report bedside and chart review. Patient was in his normal state of health when he took a shower this evening around 16:50.  Upon getting out of the shower around 17:00 his wife noticed he seemed " off".  While attempting to get dressed he seemed to have difficulty putting on his socks, also some balance difficulties requiring him to hold onto the bed & when his son called to speak with him he was having difficulty " getting the words out".  She also thinks that she noticed some slight slurred speech.  The patient describes not feeling like himself and knowing something was wrong.  He denied headache, blurred vision, dizziness, sick symptoms or sick contacts.  He did endorse feeling off balance, having difficulty getting out what he wanted to say and possible weakness in bilateral lower extremities. Of note patient is being followed for hypertension.  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   ED course: Upon arrival patient worked up for code stroke.  Speech was documented to have improved after arrival to ED. initial vitals showed hypertension with BP 184/98. Initial NIH stroke scale documented as a 2 & imaging revealed acute small left thalamic bleed measuring 1.5 cm.  Medications given: Labetalol, IV contrast and clevidipine drip started     CT head without contrast 07/25/2022: Acute intraparenchymal hemorrhage in the left thalamus measuring 1.8 x 1.4 x 1.2 cm (approximately 1.5 mL). No midline shift or other mass effect. CT angio head code stroke 07/25/2022:  Normal CTA of the head.   No aneurysm or vascular malformation. Unchanged appearance of left thalamic intraparenchymal hematoma, most consistent with hypertensive hemorrhage.   PCCM consulted for admission due to acute small left thalamic hemorrhage in the setting of uncontrolled hypertension requiring clevidipine drip.  Assessment and Plan: Hemorrhagic stroke Left thalamic hemorrhage Hypertensive emergency Patient presented with presented with dysarthria and word finding difficulty, imaging showed a small left thalamic bleed, likely secondary to hypertensive emergency.  Patient was admitted to the ICU and required clevidipine drip. He has been weaned off the drip and is currently normotensive.  Will require further medications. Repeat CT scan of the head without contrast shows similar size of a left thalamic intraparenchymal hemorrhage. No progressive mass effect. Appreciate neurology input and recommendations to maintain systolic blood pressure of less than Patient's symptoms have improved Appreciate PT recommendations, patient is considering acute rehab versus outpatient PT.    History of coronary artery disease With chronic total occlusions of D1 and nondominant RCA as well as moderate proximal LAD Medical management recommended Continue beta-blockers and nitrates    AKI On admission. Baseline serum creatinine 1.19 and it was 1.38 on admission Losartan was resumed Monitor renal function closely   On mechanical DVT prophylaxis due to intracerebral bleed        Subjective: Patient is seen and examined at the bedside.  His wife is at the bedside.  Physical Exam: Vitals:   07/28/22 0313 07/28/22 0409 07/28/22 0852 07/28/22 0900  BP:  95/62 123/80 110/80  Pulse:  65 72 68  Resp:  16 16   Temp:  97.9 F (36.6 C) 97.8 F (36.6 C)   TempSrc:      SpO2:  94% 97%   Weight: 81.1 kg     Height:       Constitutional:      General: He is not in acute distress.    Appearance: Normal  appearance. He is not ill-appearing.  HENT:     Nose: Nose normal.     Mouth/Throat:     Mouth: Mucous membranes are moist.  Eyes:     General: No visual field deficit. Cardiovascular:     Rate and Rhythm: Normal rate and regular rhythm.     Pulses: Normal pulses.     Heart sounds: Normal heart sounds.  Pulmonary:     Effort: Pulmonary effort is normal.     Breath sounds: Normal breath sounds.  Abdominal:     Palpations: Abdomen is soft.  Neurological:     Mental Status: He is alert.     GCS: GCS eye subscore is 4. GCS verbal subscore is 5. GCS motor subscore is 6.     Cranial Nerves: Cranial nerves 2-12 are intact. No cranial nerve deficit, dysarthria or facial asymmetry.     Sensory: Sensation is intact. No sensory deficit.     Motor: Motor function is intact. No weakness, tremor, atrophy, abnormal muscle tone or seizure activity.  Able to move all extremities    Coordination: Coordination is intact. Coordination normal. Finger-Nose-Finger Test and Heel to [facility] Test normal. Rapid alternating movements normal.     Deep Tendon Reflexes: Reflexes are normal and symmetric.    Data Reviewed: Labs reviewed There are no new results to review at this time.  Family Communication: Greater than 50% of time was spent discussing patient's condition and plan of care with him and his wife at the bedside.  All questions and concerns have been addressed.  They verbalized understanding and agree with the plan.     Disposition: Status is: Inpatient Remains inpatient appropriate because: Optimization of blood pressure control  Planned Discharge Destination:  To be determined    Time spent: 33 minutes  Author: Lucile Shutters, MD 07/28/2022 12:19 PM  For on call review [email or web address].

## 2022-07-28 NOTE — Progress Notes (Signed)
Patient received from CT  via bed in stable condition. 

## 2022-07-29 DIAGNOSIS — I610 Nontraumatic intracerebral hemorrhage in hemisphere, subcortical: Secondary | ICD-10-CM | POA: Diagnosis not present

## 2022-07-29 LAB — GLUCOSE, CAPILLARY: Glucose-Capillary: 90 mg/dL (ref 70–99)

## 2022-07-29 MED ORDER — ISOSORBIDE MONONITRATE ER 30 MG PO TB24
60.0000 mg | ORAL_TABLET | Freq: Every day | ORAL | Status: DC
  Administered 2022-07-29 – 2022-07-30 (×2): 60 mg via ORAL
  Filled 2022-07-29 (×2): qty 2

## 2022-07-29 NOTE — Plan of Care (Signed)
  Problem: Education: Goal: Knowledge of disease or condition will improve Outcome: Progressing Goal: Knowledge of secondary prevention will improve (MUST DOCUMENT ALL) Outcome: Progressing Goal: Knowledge of patient specific risk factors will improve Billy Kim N/A or DELETE if not current risk factor) Outcome: Progressing   Problem: Intracerebral Hemorrhage Tissue Perfusion: Goal: Complications of Intracerebral Hemorrhage will be minimized Outcome: Progressing   Problem: Coping: Goal: Will verbalize positive feelings about self Outcome: Progressing Goal: Will identify appropriate support needs Outcome: Progressing   Problem: Health Behavior/Discharge Planning: Goal: Ability to manage health-related needs will improve Outcome: Progressing Goal: Goals will be collaboratively established with patient/family Outcome: Progressing   Problem: Self-Care: Goal: Ability to participate in self-care as condition permits will improve Outcome: Progressing Goal: Ability to communicate needs accurately will improve Outcome: Progressing   Problem: Nutrition: Goal: Risk of aspiration will decrease Outcome: Progressing   Problem: Education: Goal: Knowledge of General Education information will improve Description: Including pain rating scale, medication(s)/side effects and non-pharmacologic comfort measures Outcome: Progressing

## 2022-07-29 NOTE — Progress Notes (Signed)
Neurology Progress Note  Subjective: Exam is stable no new complaints. Refused lovenox this AM 2/2 concerns about blood thinner in the setting of recent ICH  Exam: Vitals:   07/29/22 1134 07/29/22 1622  BP: 137/78 (!) 141/84  Pulse: 60 71  Resp: 16 16  Temp:  98 F (36.7 C)  SpO2: 96% 97%   Physical Exam Gen: A&O x4, NAD Resp: normal WOB CV: extremities appear well-perfused   Neuro: *MS: A&O x4. Follows multi-step commands.  *Speech: minimal dysarthria, mild aphasia, able to name and repeat *CN: PERRL 3mm, EOMI, VFF by confrontation, sensation intact, R UMN facial droop, hearing intact to voice *Motor:   Normal bulk.  No tremor, rigidity or bradykinesia. No pronator drift. 4+/5 RUE and RLE, full strength on L *Sensory: SILT. Symmetric. Mild R hemineglect *Coordination:  Finger-to-nose, heel-to-shin, rapid alternating motions were intact. *Reflexes:  2+ symm, toes down bilat *Gait: deferred   NIHSS = 6  mRS = 0  ICH score = 0  Data:  Head CT in ED 1. Acute intraparenchymal hemorrhage in the left thalamus measuring 1.8 x 1.4 x 1.2 cm (approximately 1.5 mL). 2. No midline shift or other mass effect.  Head CT 6 hrs later Left thalamic intraparenchymal hematoma has slightly increased in size to 2.3 x 1.6 x 1.4 cm (2.5 mL, previously 1.5 mL). The examination is otherwise unchanged.  Repeat head CT 7/7 - stable  CTA head 1. Normal CTA of the head.  No aneurysm or vascular malformation. 2. Unchanged appearance of left thalamic intraparenchymal hematoma, most consistent with hypertensive hemorrhage.  MRI brain wwo 1. No mass or other unexpected finding underlying the patient's left thalamic hematoma. 2. Chronic small vessel ischemia with chronic microhemorrhages.  CNS imaging personally reviewed; I agree with above interpretations  Impression: This is a 77 yo man with hx CAD, HL, HTN, UC who presents with word finding difficulty and balance issues and was found to  have a small L thalamic hemorrhage, likely hypertensive in etiology. He is at BP goal on oral meds. He has been recommended to CIR. I recommended yesterday that DVT prophylaxis with lovenox be started but family wishes to consider this overnight. Will revisit tomorrow then s/o. OK to discharge when CIR has bed.  Recommendations: - Goal SBP <160 - OK with neuro to start pharm DVT prophylaxis, family wishes to consider overnight and readdress tomorrow before starting - Neurochecks and vitals per unit routine   Will rediscuss DVT prophylaxis tomorrow then s/o   Bing Neighbors, MD [facility] [phone]  If 7pm- 7am, please page neurology on call as listed in AMION.

## 2022-07-29 NOTE — Progress Notes (Addendum)
Neurology Progress Note  Subjective: Exam is stable no new complaints  Exam: Vitals:   07/29/22 0015 07/29/22 0500  BP: 117/66 123/75  Pulse: 65 (!) 57  Resp: 18 18  Temp: 98 F (36.7 C) 97.8 F (36.6 C)  SpO2: 93% 98%   Physical Exam Gen: A&O x4, NAD Resp: normal WOB CV: extremities appear well-perfused   Neuro: *MS: A&O x4. Follows multi-step commands.  *Speech: minimal dysarthria, mild aphasia, able to name and repeat *CN: PERRL 3mm, EOMI, VFF by confrontation, sensation intact, R UMN facial droop, hearing intact to voice *Motor:   Normal bulk.  No tremor, rigidity or bradykinesia. No pronator drift. 4+/5 RUE and RLE, full strength on L *Sensory: SILT. Symmetric. Mild R hemineglect *Coordination:  Finger-to-nose, heel-to-shin, rapid alternating motions were intact. *Reflexes:  2+ symm, toes down bilat *Gait: deferred   NIHSS = 6  mRS = 0  ICH score = 0  Data:  Head CT in ED 1. Acute intraparenchymal hemorrhage in the left thalamus measuring 1.8 x 1.4 x 1.2 cm (approximately 1.5 mL). 2. No midline shift or other mass effect.  Head CT 6 hrs later Left thalamic intraparenchymal hematoma has slightly increased in size to 2.3 x 1.6 x 1.4 cm (2.5 mL, previously 1.5 mL). The examination is otherwise unchanged.  Repeat head CT 7/7 - stable  CTA head 1. Normal CTA of the head.  No aneurysm or vascular malformation. 2. Unchanged appearance of left thalamic intraparenchymal hematoma, most consistent with hypertensive hemorrhage.  MRI brain wwo 1. No mass or other unexpected finding underlying the patient's left thalamic hematoma. 2. Chronic small vessel ischemia with chronic microhemorrhages.  CNS imaging personally reviewed; I agree with above interpretations  Impression: This is a 77 yo man with hx CAD, HL, HTN, UC who presents with word finding difficulty and balance issues and was found to have a small L thalamic hemorrhage, likely hypertensive in etiology.  He is at BP goal on oral meds. He has been recommended to CIR.  Recommendations: - Goal SBP <160 - OK to start pharm DVT prophylaxis - Neurochecks and vitals per unit routine   Will continue to follow   Bing Neighbors, MD [facility] [phone]  If 7pm- 7am, please page neurology on call as listed in AMION.

## 2022-07-29 NOTE — Progress Notes (Signed)
Progress Note   Patient: Billy Kim Orne WUJ:[id number] DOB: 1945-12-02 DOA: 07/25/2022     4 DOS: the patient was seen and examined on 07/29/2022   Brief hospital course: 77 year old male presenting to [facility] ED from home on 07/25/2022 for evaluation as a code stroke.   History provided per bedside patient report, correlating spouse report bedside and chart review. Patient was in his normal state of health when he took a shower this evening around 16:50.  Upon getting out of the shower around 17:00 his wife noticed he seemed " off".  While attempting to get dressed he seemed to have difficulty putting on his socks, also some balance difficulties requiring him to hold onto the bed & when his son called to speak with him he was having difficulty " getting the words out".  She also thinks that she noticed some slight slurred speech.  The patient describes not feeling like himself and knowing something was wrong.  He denied headache, blurred vision, dizziness, sick symptoms or sick contacts.  He did endorse feeling off balance, having difficulty getting out what he wanted to say and possible weakness in bilateral lower extremities. Of note patient is being followed for hypertension.  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   ED course: Upon arrival patient worked up for code stroke.  Speech was documented to have improved after arrival to ED. initial vitals showed hypertension with BP 184/98. Initial NIH stroke scale documented as a 2 & imaging revealed acute small left thalamic bleed measuring 1.5 cm.  Medications given: Labetalol, IV contrast and clevidipine drip started     CT head without contrast 07/25/2022: Acute intraparenchymal hemorrhage in the left thalamus measuring 1.8 x 1.4 x 1.2 cm (approximately 1.5 mL). No midline shift or other mass effect. CT angio head code stroke 07/25/2022:  Normal CTA of the head.  No  aneurysm or vascular malformation. Unchanged appearance of left thalamic intraparenchymal hematoma, most consistent with hypertensive hemorrhage.   PCCM consulted for admission due to acute small left thalamic hemorrhage in the setting of uncontrolled hypertension requiring clevidipine drip.     Assessment and Plan: Hemorrhagic stroke Left thalamic hemorrhage Hypertensive emergency Patient presented with presented with dysarthria and word finding difficulty, imaging showed a small left thalamic bleed, likely secondary to hypertensive emergency.  Patient was admitted to the ICU and required clevidipine drip. He has been weaned off the drip and is currently normotensive.  Blood pressure medications have been adjusted to maintain systolic blood pressure less than 150 Repeat CT scan of the head without contrast shows similar size of a left thalamic intraparenchymal hemorrhage. No progressive mass effect. Appreciate neurology input and recommendations to maintain systolic blood pressure of less than Patient's symptoms have improved Appreciate PT recommendations, patient is considering acute rehab versus outpatient PT.       History of coronary artery disease With chronic total occlusions of D1 and nondominant RCA as well as moderate proximal LAD Medical management recommended Continue beta-blockers and nitrates       AKI On admission. Baseline serum creatinine 1.19 and it was 1.38 on admission Losartan was resumed Monitor renal function closely     On mechanical DVT prophylaxis due to intracerebral bleed              Subjective: Patient is seen and examined at the bedside.  Wife is  at the bedside.  Has no new complaints  Physical Exam: Vitals:   07/29/22 0015 07/29/22 0500 07/29/22 0817 07/29/22 0900  BP: 117/66 123/75 134/75   Pulse: 65 (!) 57 (!) 52 62  Resp: 18 18 16    Temp: 98 F (36.7 C) 97.8 F (36.6 C) 97.6 F (36.4 C)   TempSrc: Oral Oral    SpO2:  93% 98% 97%   Weight:      Height:        General: He is not in acute distress.    Appearance: Normal appearance. He is not ill-appearing.  HENT:     Nose: Nose normal.     Mouth/Throat:     Mouth: Mucous membranes are moist.  Eyes:     General: No visual field deficit. Cardiovascular:     Rate and Rhythm: Normal rate and regular rhythm.     Pulses: Normal pulses.     Heart sounds: Normal heart sounds.  Pulmonary:     Effort: Pulmonary effort is normal.     Breath sounds: Normal breath sounds.  Abdominal:     Palpations: Abdomen is soft.  Neurological:     Mental Status: He is alert.     GCS: GCS eye subscore is 4. GCS verbal subscore is 5. GCS motor subscore is 6.     Cranial Nerves: Cranial nerves 2-12 are intact. No cranial nerve deficit, dysarthria or facial asymmetry.     Sensory: Sensation is intact. No sensory deficit.     Motor: Motor function is intact. No weakness, tremor, atrophy, abnormal muscle tone or seizure activity.  Able to move all extremities    Coordination: Coordination is intact. Coordination normal. Finger-Nose-Finger Test and Heel to [facility] Test normal. Rapid alternating movements normal.     Deep Tendon Reflexes: Reflexes are normal and symmetric.  Data Reviewed:  There are no new results to review at this time.  Family Communication: Greater than 50% of time was spent discussing the patient's condition and plan of care with him and his wife at the bedside..  All questions addressed.  They verbalized understanding and agreed with the plan. Patient's wife is concerned about him receiving Lovenox in the setting of hemorrhagic stroke.  Feels more comfortable with him on SCDs for DVT prophylaxis.   Disposition: Status is: Inpatient Remains inpatient appropriate because: Discharge planning  Planned Discharge Destination: Rehab    Time spent: 30 minutes  Author: Lucile Shutters, MD 07/29/2022 10:45 AM  For on call review [email or web address].

## 2022-07-29 NOTE — Progress Notes (Signed)
Speech Language Pathology Treatment: Cognitive-Linquistic  Patient Details Name: Billy Kim MRN: [id number] DOB: May 16, 1945 Today's Date: 07/29/2022 Time: 7253-6644 SLP Time Calculation (min) (ACUTE ONLY): 65 min  Assessment / Plan / Recommendation Clinical Impression  Pt seen for skilled SLP services targeting functional expressive communication. Wife present for session. Pt continues to present with s/sx fluent aphasia most c/w anomic aphasia. Pt's speech is mainly fluent with instances of anomia c/b paucity of speech and emerging use of circumlocution to repair breakdowns. Very minimal and seldomly occuring articulatory imprecision noted. Pt's speech is 100% intelligible to an unfamilair listener. Pt with good awareness of speech errors. During structured conversation tasks and wile answering biographical questions, pt benefited from cueing for use of circumlocution as well as verbal hierarchical cueing to repair anomic events. Pt and wife educated on re: principles of neuroplasticity, changes to speech/language following ICH, aphasia, positive prognostic indicators for speech/language improvement, basic communication strategies, self-advocacy skills, and SLP POC. Both verbalized understanding/agreement.    HPI HPI: Pt is 77 year old male presenting to [facility] ED from home on 07/25/2022 for evaluation as a code stroke. Pt with PMHx HLD, HTN, and CAD. MRI, 6/6, "1. No mass or other unexpected finding underlying the patient's left  thalamic hematoma.  2. Chronic small vessel ischemia with chronic microhemorrhages."      SLP Plan  Continue with current plan of care      Recommendations for follow up therapy are one component of a multi-disciplinary discharge planning process, led by the attending physician.  Recommendations may be updated based on patient status, additional functional criteria and insurance authorization.    Recommendations                     Oral care BID     Aphasia  (R47.01)     Continue with current plan of care    Clyde Canterbury, M.S., CCC-SLP Speech-Language Pathologist [facility] [phone] Arnette Felts)  Billy Kim  07/29/2022, 11:55 AM

## 2022-07-30 ENCOUNTER — Other Ambulatory Visit (HOSPITAL_COMMUNITY): Payer: Self-pay

## 2022-07-30 DIAGNOSIS — I610 Nontraumatic intracerebral hemorrhage in hemisphere, subcortical: Secondary | ICD-10-CM | POA: Diagnosis not present

## 2022-07-30 LAB — BASIC METABOLIC PANEL
Anion gap: 7 (ref 5–15)
BUN: 31 mg/dL — ABNORMAL HIGH (ref 8–23)
CO2: 25 mmol/L (ref 22–32)
Calcium: 8.4 mg/dL — ABNORMAL LOW (ref 8.9–10.3)
Chloride: 108 mmol/L (ref 98–111)
Creatinine, Ser: 1.31 mg/dL — ABNORMAL HIGH (ref 0.61–1.24)
GFR, Estimated: 56 mL/min — ABNORMAL LOW (ref >60)
Glucose, Bld: 117 mg/dL — ABNORMAL HIGH (ref 70–99)
Potassium: 3.9 mmol/L (ref 3.5–5.1)
Sodium: 140 mmol/L (ref 135–145)

## 2022-07-30 LAB — CBC
HCT: 41.2 % (ref 39.0–52.0)
Hemoglobin: 14.2 g/dL (ref 13.0–17.0)
MCH: 30.3 pg (ref 26.0–34.0)
MCHC: 34.5 g/dL (ref 30.0–36.0)
MCV: 88 fL (ref 80.0–100.0)
Platelets: 126 10*3/uL — ABNORMAL LOW (ref 150–400)
RBC: 4.68 MIL/uL (ref 4.22–5.81)
RDW: 13.2 % (ref 11.5–15.5)
WBC: 6.2 10*3/uL (ref 4.0–10.5)
nRBC: 0 % (ref 0.0–0.2)

## 2022-07-30 MED ORDER — ISOSORBIDE MONONITRATE ER 30 MG PO TB24
60.0000 mg | ORAL_TABLET | Freq: Once | ORAL | Status: AC
  Administered 2022-07-30: 60 mg via ORAL
  Filled 2022-07-30: qty 2

## 2022-07-30 MED ORDER — ISOSORBIDE MONONITRATE ER 30 MG PO TB24
120.0000 mg | ORAL_TABLET | Freq: Every day | ORAL | Status: DC
  Administered 2022-07-31: 120 mg via ORAL
  Filled 2022-07-30: qty 4

## 2022-07-30 NOTE — Progress Notes (Signed)
Progress Note   Patient: Billy Kim Kim WJX:[id number] DOB: May 14, 1945 DOA: 07/25/2022     5 DOS: the patient was seen and examined on 07/30/2022   Brief hospital course:  77 year old male presenting to [facility] ED from home on 07/25/2022 for evaluation as a code stroke.   History provided per bedside patient report, correlating spouse report bedside and chart review. Patient was in his normal state of health when he took a shower this evening around 16:50.  Upon getting out of the shower around 17:00 his wife noticed he seemed " off".  While attempting to get dressed he seemed to have difficulty putting on his socks, also some balance difficulties requiring him to hold onto the bed & when his son called to speak with him he was having difficulty " getting the words out".  She also thinks that she noticed some slight slurred speech.  The patient describes not feeling like himself and knowing something was wrong.  He denied headache, blurred vision, dizziness, sick symptoms or sick contacts.  He did endorse feeling off balance, having difficulty getting out what he wanted to say and possible weakness in bilateral lower extremities. Of note patient is being followed for hypertension.  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   ED course: Upon arrival patient worked up for code stroke.  Speech was documented to have improved after arrival to ED. initial vitals showed hypertension with BP 184/98. Initial NIH stroke scale documented as a 2 & imaging revealed acute small left thalamic bleed measuring 1.5 cm.  Medications given: Labetalol, IV contrast and clevidipine drip started     CT head without contrast 07/25/2022: Acute intraparenchymal hemorrhage in the left thalamus measuring 1.8 x 1.4 x 1.2 cm (approximately 1.5 mL). No midline shift or other mass effect. CT angio head code stroke 07/25/2022:  Normal CTA of the head.   No aneurysm or vascular malformation. Unchanged appearance of left thalamic intraparenchymal hematoma, most consistent with hypertensive hemorrhage.   PCCM consulted for admission due to acute small left thalamic hemorrhage in the setting of uncontrolled hypertension requiring clevidipine drip.    Assessment and Plan:  Hemorrhagic stroke Left thalamic hemorrhage Hypertensive emergency Patient presented with presented with dysarthria and word finding difficulty, imaging showed a small left thalamic bleed, likely secondary to hypertensive emergency.  Patient was admitted to the ICU and required clevidipine drip. He has been weaned off the drip and is currently normotensive.  Blood pressure medications have been adjusted to maintain systolic blood pressure less than 150 Repeat CT scan of the head without contrast shows similar size of a left thalamic intraparenchymal hemorrhage. No progressive mass effect. Appreciate neurology input and recommendations to maintain systolic blood pressure of less than Patient's symptoms have improved Appreciate PT recommendations, for discharge to [facility] rehab in a.m. Continue Imdur, losartan and carvedilol     History of coronary artery disease With chronic total occlusions of D1 and nondominant RCA as well as moderate proximal LAD Medical management recommended Continue beta-blockers and nitrates       AKI On admission. Baseline serum creatinine 1.19 and it was 1.38 on admission Losartan was resumed Monitor renal function closely     On mechanical DVT prophylaxis due to intracerebral bleed.  Neurology had recommended to resume DVT prophylaxis but patient's wife declined      Subjective: No new complaints  Physical Exam: Vitals:   07/30/22 0500 07/30/22 0604 07/30/22 0709 07/30/22 1534  BP:  123/78 136/78 (!) 154/92  Pulse:  (!) 57 60 62  Resp:  18 18 16   Temp:  98.6 F (37 C) 97.8 F (36.6 C) 98.2 F (36.8 C)   TempSrc:  Oral Oral Oral  SpO2:  98% 98% 98%  Weight: 84.1 kg     Height:        General: He is not in acute distress.    Appearance: Normal appearance. He is not ill-appearing.  HENT:     Nose: Nose normal.     Mouth/Throat:     Mouth: Mucous membranes are moist.  Eyes:     General: No visual field deficit. Cardiovascular:     Rate and Rhythm: Normal rate and regular rhythm.     Pulses: Normal pulses.     Heart sounds: Normal heart sounds.  Pulmonary:     Effort: Pulmonary effort is normal.     Breath sounds: Normal breath sounds.  Abdominal:     Palpations: Abdomen is soft.  Neurological:     Mental Status: He is alert.     GCS: GCS eye subscore is 4. GCS verbal subscore is 5. GCS motor subscore is 6.     Cranial Nerves: Cranial nerves 2-12 are intact. No cranial nerve deficit, dysarthria or facial asymmetry.     Sensory: Sensation is intact. No sensory deficit.     Motor: Motor function is intact. No weakness, tremor, atrophy, abnormal muscle tone or seizure activity.  Able to move all extremities    Coordination: Coordination is intact. Coordination normal. Finger-Nose-Finger Test and Heel to [facility] Test normal. Rapid alternating movements normal.     Deep Tendon Reflexes: Reflexes are normal and symmetric.   Data Reviewed: Labs reviewed.  Creatinine 1.30 There are no new results to review at this time.  Family Communication: Patient 50% of time was spent discussing patient's condition and plan with him and his wife at the bedside.  All questions and concerns have been addressed.  They verbalized understanding and agree with the plan.  Disposition: Status is: Inpatient Remains inpatient appropriate because: Discharge to inpatient rehab in a.m.  Planned Discharge Destination: Rehab    Time spent: 33 minutes  Author: Lucile Shutters, MD 07/30/2022 4:53 PM  For on call review [email or web address].

## 2022-07-30 NOTE — Progress Notes (Addendum)
Inpatient Rehab Admissions Coordinator:   Addendum:  Spoke with patient's wife early this am to discuss rehab venue options and explain CIR. Insurance had been opened last Friday by my colleague Britta Mccreedy. Wife able to provide assistance and 24/7 supervision for patient. She wanted to see how he was doing with therapy today as she may bring him home.       11:00 am-Received approval from insurance for CIR admission. Spoke with wife to let her know, she is deciding if patient will go to CIR or just go home. She is to let me know asap. Will await her decision.  Rehab Admissons Coordinator [place], [place], [place] [phone]

## 2022-07-30 NOTE — TOC Progression Note (Signed)
Transition of Care [facility]) - Progression Note    Patient Details  Name: Billy Kim MRN: [id number] Date of Birth: 05-31-1945  Transition of Care [facility]) CM/SW Contact  Allena Katz, LCSW Phone Number: 07/30/2022, 11:17 AM  Clinical Narrative:   Pt pending CIR eval. TOC following.          Expected Discharge Plan and Services                                               Social Determinants of Health (SDOH) Interventions SDOH Screenings   Food Insecurity: No Food Insecurity (08/04/2020)  Housing: Low Risk  (08/04/2020)  Transportation Needs: No Transportation Needs (08/04/2020)  Alcohol Screen: Low Risk  (08/04/2020)  Depression (PHQ2-9): Low Risk  (08/08/2021)  Financial Resource Strain: Low Risk  (08/04/2020)  Physical Activity: Inactive (08/04/2020)  Stress: No Stress Concern Present (08/04/2020)  Tobacco Use: Low Risk  (07/25/2022)    Readmission Risk Interventions     No data to display

## 2022-07-30 NOTE — Progress Notes (Signed)
Occupational Therapy Treatment Patient Details Name: Billy Kim MRN: [id number] DOB: 07-07-1945 Today's Date: 07/30/2022   History of present illness Pt is a 77 y/o M admitted on 07/25/22 for acute small L thalamic hemorrhage in the setting of uncontrolled HTN. Repeat CT on 6/6 showed worsening hemorrhage. PMH: CAD, ulcerative colitis, HTN, HLD   OT comments  Pt seen for OT treatment on this date. Upon arrival to room pt resting in bed, agreeable to tx. Pt requires CGA for transfers and ambulation. Pt used wall/railing while picking up objects on ground to maintain balance. Pt demonstrated right side inattention during session, evidenced by right arm trailing behind while using railing during stair mobility, and during scanning activity. Verbal cueing for scanning to right side while walking to ensure path was free of obstacles. Word finding and expressive deficits observed while challenged with naming animals that started with letters A-F. Verbal guidance cueing required for 3 of 6 letters. Pt making progress toward goals, will continue to follow POC. Discharge recommendation remains appropriate.     Recommendations for follow up therapy are one component of a multi-disciplinary discharge planning process, led by the attending physician.  Recommendations may be updated based on patient status, additional functional criteria and insurance authorization.    Assistance Recommended at Discharge Frequent or constant Supervision/Assistance  Patient can return home with the following  A little help with walking and/or transfers;A little help with bathing/dressing/bathroom;Assist for transportation;Help with stairs or ramp for entrance   Equipment Recommendations  Other (comment) (defer)    Recommendations for Other Services      Precautions / Restrictions Precautions Precautions: Fall Precaution Comments: R inattention, mild R hemi Restrictions Weight Bearing Restrictions: No       Mobility  Bed Mobility Overal bed mobility: Modified Independent Bed Mobility: Supine to Sit     Supine to sit: Supervision, HOB elevated          Transfers Overall transfer level: Needs assistance Equipment used: None Transfers: Sit to/from Stand Sit to Stand: Min guard                 Balance Overall balance assessment: Needs assistance Sitting-balance support: Feet supported Sitting balance-Leahy Scale: Good     Standing balance support: No upper extremity supported, During functional activity Standing balance-Leahy Scale: Good Standing balance comment: Good standing balance while reaching overhead and for items on the ground.                           ADL either performed or assessed with clinical judgement   ADL                                              Extremity/Trunk Assessment Upper Extremity Assessment Upper Extremity Assessment: Overall WFL for tasks assessed RUE Deficits / Details: decreased Inattiention RUE Coordination: decreased fine motor   Lower Extremity Assessment Lower Extremity Assessment: Overall WFL for tasks assessed        Vision       Perception     Praxis      Cognition Arousal/Alertness: Awake/alert Behavior During Therapy: WFL for tasks assessed/performed Overall Cognitive Status: Impaired/Different from baseline Area of Impairment: Awareness  Awareness: Emergent   General Comments: Pt decreased situational awareness during ambulating in a moderate stimulating environment.        Exercises      Shoulder Instructions       General Comments      Pertinent Vitals/ Pain       Pain Assessment Pain Assessment: No/denies pain  Home Living                                          Prior Functioning/Environment              Frequency  Min 3X/week        Progress Toward Goals  OT Goals(current goals can now be found in the  care plan section)  Progress towards OT goals: Progressing toward goals  Acute Rehab OT Goals Patient Stated Goal: To go home OT Goal Formulation: With patient/family Time For Goal Achievement: 08/10/22 Potential to Achieve Goals: Fair ADL Goals Pt Will Perform Grooming: with supervision;standing Pt Will Perform Lower Body Dressing: with supervision;sit to/from stand Pt Will Transfer to Toilet: with supervision;ambulating Pt Will Perform Toileting - Clothing Manipulation and hygiene: with supervision;sit to/from stand  Plan Discharge plan remains appropriate    Co-evaluation                 AM-PAC OT "6 Clicks" Daily Activity     Outcome Measure   Help from another person eating meals?: A Little Help from another person taking care of personal grooming?: A Little Help from another person toileting, which includes using toliet, bedpan, or urinal?: None Help from another person bathing (including washing, rinsing, drying)?: A Little Help from another person to put on and taking off regular upper body clothing?: A Little Help from another person to put on and taking off regular lower body clothing?: A Little 6 Click Score: 19    End of Session    OT Visit Diagnosis: Unsteadiness on feet (R26.81);Muscle weakness (generalized) (M62.81);Hemiplegia and hemiparesis Hemiplegia - Right/Left: Right Hemiplegia - dominant/non-dominant: Dominant   Activity Tolerance Patient tolerated treatment well   Patient Left with call bell/phone within reach;in bed;with family/visitor present   Nurse Communication          Time: 1610-9604 OT Time Calculation (min): 20 min  Charges: OT General Charges $OT Visit: 1 Visit OT Treatments $Neuromuscular Re-education: 8-22 mins  Thresa Ross, OTS

## 2022-07-30 NOTE — TOC Benefit Eligibility Note (Signed)
Patient Product/process development scientist completed.    The patient is currently admitted and upon discharge could be taking icosapent Ethyl (Vascepa) 1 g capsule.  The current 30 day co-pay is $100.00.   The patient is insured through [employer] part D   This test claim was processed through [facility]- copay amounts may vary at other pharmacies due to pharmacy/plan contracts, or as the patient moves through the different stages of their insurance plan.  Roland Earl, CPHT Pharmacy Patient Advocate Specialist [facility] Patient Advocate Team Direct Number: [phone]  Fax: [phone]

## 2022-07-30 NOTE — Progress Notes (Signed)
Inpatient Rehab Admissions Coordinator:   Just spoke with [name], Mr Mcquigg wife. They would like to come to CIR. Insurance has approved for 7 days. We will hope to admit tomorrow if bed available. Will update family/staff after rounds tomorrow once we know plan.   Rehab Admissons Coordinator [place], [place], [place] [phone]

## 2022-07-30 NOTE — Progress Notes (Signed)
Speech Language Pathology Treatment: Cognitive-Linquistic  Patient Details Name: Billy Kim MRN: [id number] DOB: November 20, 1945 Today's Date: 07/30/2022 Time: 1000-1035 SLP Time Calculation (min) (ACUTE ONLY): 35 min  Assessment / Plan / Recommendation Clinical Impression  Pt seen for follow up SLP intervention targeting education and compensatory strategy training in the setting of aphasia. Pt's current language profile remains most consistent with anomic variant of aphasia, with expressive language c/b intermittent halted speech pattern vs anomic hesitation. Pt intelligible for all spontaneous utterances. Spouse endorsed continued progress in speech capabilities, reporting that pt is 75% returned to baseline language. Script training introduced to aid approaching complex communication environments with application to scenarios x2. Education shared regarding the impact of topic, communication partner, level of fatigue, and confidence on communication efficiency. Pt/spouse reported understanding. All questions answered regarding speech therapy recommendations and recommendations for transition home (and language demands). Continue to recommend follow up SLP services post discharge.    HPI HPI: Pt is 77 year old male presenting to [facility] ED from home on 07/25/2022 for evaluation as a code stroke. Pt with PMHx HLD, HTN, and CAD. MRI, 6/6, "1. No mass or other unexpected finding underlying the patient's left  thalamic hematoma.  2. Chronic small vessel ischemia with chronic microhemorrhages."      SLP Plan  Continue with current plan of care      Recommendations for follow up therapy are one component of a multi-disciplinary discharge planning process, led by the attending physician.  Recommendations may be updated based on patient status, additional functional criteria and insurance authorization.    Recommendations                  Oral care BID     Aphasia (R47.01)     Continue with  current plan of care   [place] Serafino Burciaga Clapp MS [facility] SLP   [place] J Clapp  07/30/2022, 10:45 AM

## 2022-07-30 NOTE — TOC Progression Note (Signed)
Transition of Care [facility]) - Progression Note    Patient Details  Name: Billy Kim MRN: [id number] Date of Birth: 21-May-1945  Transition of Care [facility]) CM/SW Contact  Allena Katz, LCSW Phone Number: 07/30/2022, 4:04 PM  Clinical Narrative:   Pt to admit to CIR tomorrow pending bed.          Expected Discharge Plan and Services                                               Social Determinants of Health (SDOH) Interventions SDOH Screenings   Food Insecurity: No Food Insecurity (08/04/2020)  Housing: Low Risk  (08/04/2020)  Transportation Needs: No Transportation Needs (08/04/2020)  Alcohol Screen: Low Risk  (08/04/2020)  Depression (PHQ2-9): Low Risk  (08/08/2021)  Financial Resource Strain: Low Risk  (08/04/2020)  Physical Activity: Inactive (08/04/2020)  Stress: No Stress Concern Present (08/04/2020)  Tobacco Use: Low Risk  (07/25/2022)    Readmission Risk Interventions     No data to display

## 2022-07-30 NOTE — Progress Notes (Signed)
Physical Therapy Treatment Patient Details Name: Billy Kim MRN: [id number] DOB: 04-14-1945 Today's Date: 07/30/2022   History of Present Illness Pt is a 77 y/o M admitted on 07/25/22 for acute small L thalamic hemorrhage in the setting of uncontrolled HTN. Repeat CT on 6/6 showed worsening hemorrhage. PMH: CAD, ulcerative colitis, HTN, HLD    PT Comments    Patient sitting up in bed upon arrival and agreeable to therapy. BP at resting 155/81, pain not symptomatic. Performed bed mobility to EOB with supervision assist. Sit to stand with no AD and min guard assist. Ambulated 170 ft around the nursing station. 1 break to check BP, 131/71. Ambulated approximately 80 ft with RW then continued with out AD. Intermittent veering to the right with PT guarding that side. Intructed patient to perform vertical and horizontal head motions while walking with increased veering to the right. Ther ex performed at patients sink in the room, b/l single finger support for standing marching and heel raises. Performed 10 standing marches and heel raises with b/l with min assist for balance, guarding on the R side due to attention deficit and leaning. Sitting EOB 10 LAQ b/l with patient reported difficulty on the RLE.5 x STS with no UE support and min guard for R lateral lean. Patient BP at end of session in bed was 151/80 with no reported symptoms. Patient left in bed with call bell near, family in room, and bed alarm set. SCD's reapplied. Education provided to the family regarding CIR or neuro outpatient experience and the difference between settings upon request. Patients performance during therapy discussed with patient and wife at end of session.   Recommendations for follow up therapy are one component of a multi-disciplinary discharge planning process, led by the attending physician.  Recommendations may be updated based on patient status, additional functional criteria and insurance authorization.  Follow Up  Recommendations       Assistance Recommended at Discharge Intermittent Supervision/Assistance  Patient can return home with the following A little help with walking and/or transfers;A little help with bathing/dressing/bathroom;Assistance with cooking/housework;Assist for transportation;Help with stairs or ramp for entrance   Equipment Recommendations  None recommended by PT    Recommendations for Other Services       Precautions / Restrictions Precautions Precautions: Fall Precaution Comments: R inattention, mild R hemi Restrictions Weight Bearing Restrictions: No     Mobility  Bed Mobility Overal bed mobility: Modified Independent Bed Mobility: Supine to Sit     Supine to sit: Supervision, HOB elevated     General bed mobility comments: Patient in bed, transitioned from long sitting to EOB with supervison. No use of bed rails.    Transfers Overall transfer level: Needs assistance Equipment used: None Transfers: Sit to/from Stand Sit to Stand: Min guard           General transfer comment: STS from EOB without AD. min guard for R leaning/drifting.    Ambulation/Gait Ambulation/Gait assistance: Min guard Gait Distance (Feet): 170 Feet Assistive device: Rolling walker (2 wheels) Gait Pattern/deviations: Step-through pattern, Shuffle, Drifts right/left, Trunk flexed       General Gait Details: improved R foot clearance, RW used for approximately 80 ft with min guard. RW discarded with min guard the rest of the distance. 1 BP check at 131/71, no patient reported symptoms.   Stairs             Wheelchair Mobility    Modified Rankin (Stroke Patients Only)  Balance Overall balance assessment: Needs assistance Sitting-balance support: Feet supported Sitting balance-Leahy Scale: Good Sitting balance - Comments: sitting EOB with no UE support for 2 minutes during coordination testing.   Standing balance support: During functional activity,  Bilateral upper extremity supported Standing balance-Leahy Scale: Fair Standing balance comment: standing at sink with b/l single finger support. able to perform standing marches with decreased speed and balance. Min assist on right side due to inattention/veering.                            Cognition Arousal/Alertness: Awake/alert Behavior During Therapy: WFL for tasks assessed/performed Overall Cognitive Status: Impaired/Different from baseline Area of Impairment: Awareness                           Awareness: Intellectual   General Comments: Pt. follows simple commands well. Difficulty verablizing thoughts/words.        Exercises General Exercises - Lower Extremity Long Arc Quad: AROM, Both, 10 reps, Seated Hip Flexion/Marching: AROM, Strengthening, Both, 10 reps, Standing Heel Raises: AROM, Strengthening, Both, 10 reps, Standing Other Exercises Other Exercises: sit to stand x 5 with no AD. Other Exercises: education on expected therapy experience btween outpatient neuro and CIR.    General Comments        Pertinent Vitals/Pain Pain Assessment Pain Assessment: No/denies pain    Home Living                          Prior Function            PT Goals (current goals can now be found in the care plan section) Acute Rehab PT Goals Patient Stated Goal: get better, return to working PT Goal Formulation: With patient/family Time For Goal Achievement: 08/10/22 Potential to Achieve Goals: Good Progress towards PT goals: Progressing toward goals    Frequency    Min 4X/week      PT Plan Current plan remains appropriate    Co-evaluation              AM-PAC PT "6 Clicks" Mobility   Outcome Measure                   End of Session Equipment Utilized During Treatment: Gait belt Activity Tolerance: Patient tolerated treatment well Patient left: in bed;with bed alarm set;with family/visitor present;with call bell/phone  within reach Nurse Communication: Mobility status PT Visit Diagnosis: Hemiplegia and hemiparesis Hemiplegia - Right/Left: Right Hemiplegia - dominant/non-dominant: Dominant Hemiplegia - caused by: Nontraumatic intracerebral hemorrhage     Time: 1135-1206 PT Time Calculation (min) (ACUTE ONLY): 31 min  Charges:                        Malachi Carl, SPT   Malachi Carl 07/30/2022, 1:03 PM

## 2022-07-30 NOTE — Progress Notes (Addendum)
Subjective: Patient's wife is at the bedside.   Objective: Current vital signs: BP 130/81 (BP Location: Left Arm)   Pulse 67   Temp 98.7 F (37.1 C) (Oral)   Resp 18   Ht 5\' 8"  (1.727 m)   Wt 84.1 kg   SpO2 97%   BMI 28.19 kg/m  Vital signs in last 24 hours: Temp:  [97.8 F (36.6 C)-98.7 F (37.1 C)] 98.7 F (37.1 C) (06/10 2050) Pulse Rate:  [57-67] 67 (06/10 2050) Resp:  [16-18] 18 (06/10 2050) BP: (123-154)/(78-92) 130/81 (06/10 2050) SpO2:  [97 %-100 %] 97 % (06/10 2050) Weight:  [84.1 kg] 84.1 kg (06/10 0500)  Intake/Output from previous day: 06/09 0701 - 06/10 0700 In: 240 [P.O.:240] Out: -  Intake/Output this shift: Total I/O In: 240 [P.O.:240] Out: -  Nutritional status:  Diet Order             Diet regular Room service appropriate? Yes with Assist; Fluid consistency: Thin  Diet effective now                   Physical Exam HEENT- [place]/AT   Lungs- Respirations unlabored Extremities- Warm and well-perfused  Neurological Examination Mental Status: Awake and alert. Fully oriented. Thought content appropriate.  Speech fluent without evidence of aphasia.  Able to follow all commands without difficulty. Cranial Nerves: II: Temporal visual fields intact with no extinction to DSS. . III,IV, VI: Fixates and tracks normally VII: Smile symmetric VIII: Hearing intact to voice IX,X: No hypophonia or hoarseness XI: Symmetric XII: No lingual dysarthria Motor: RUE: 5/5 LUE: 5/5 RLE: 5/5 LLE: 5/5 Sensory: FT intact x 4. No extinction to DSS. Cerebellar: No ataxia with FNF bilaterally Gait: Deferred   Lab Results: Results for orders placed or performed during the hospital encounter of 07/25/22 (from the past 48 hour(s))  Glucose, capillary     Status: None   Collection Time: 07/29/22  8:18 AM  Result Value Ref Range   Glucose-Capillary 90 70 - 99 mg/dL    Comment: Glucose reference range applies only to samples taken after fasting for at least 8  hours.  CBC     Status: Abnormal   Collection Time: 07/30/22  5:25 AM  Result Value Ref Range   WBC 6.2 4.0 - 10.5 K/uL   RBC 4.68 4.22 - 5.81 MIL/uL   Hemoglobin 14.2 13.0 - 17.0 g/dL   HCT 16.1 09.6 - 04.5 %   MCV 88.0 80.0 - 100.0 fL   MCH 30.3 26.0 - 34.0 pg   MCHC 34.5 30.0 - 36.0 g/dL   RDW 40.9 81.1 - 91.4 %   Platelets 126 (L) 150 - 400 K/uL   nRBC 0.0 0.0 - 0.2 %    Comment: Performed at [facility], [address] [place], [place] [address]  Basic metabolic panel     Status: Abnormal   Collection Time: 07/30/22  5:25 AM  Result Value Ref Range   Sodium 140 135 - 145 mmol/L   Potassium 3.9 3.5 - 5.1 mmol/L   Chloride 108 98 - 111 mmol/L   CO2 25 22 - 32 mmol/L   Glucose, Bld 117 (H) 70 - 99 mg/dL    Comment: Glucose reference range applies only to samples taken after fasting for at least 8 hours.   BUN 31 (H) 8 - 23 mg/dL   Creatinine, Ser 6.21 (H) 0.61 - 1.24 mg/dL   Calcium 8.4 (L) 8.9 - 10.3 mg/dL   GFR, Estimated  56 (L) >60 mL/min    Comment: (NOTE) Calculated using the CKD-EPI Creatinine Equation (2021)    Anion gap 7 5 - 15    Comment: Performed at [facility], [address] [place], [place] [address]    Recent Results (from the past 240 hour(s))  MRSA Next Gen by PCR, Nasal     Status: None   Collection Time: 07/25/22  8:38 PM   Specimen: Nasal Mucosa; Nasal Swab  Result Value Ref Range Status   MRSA by PCR Next Gen NOT DETECTED NOT DETECTED Final    Comment: (NOTE) The GeneXpert MRSA Assay (FDA approved for NASAL specimens only), is one component of a comprehensive MRSA colonization surveillance program. It is not intended to diagnose MRSA infection nor to guide or monitor treatment for MRSA infections. Test performance is not FDA approved in patients less than 94 years old. Performed at [facility], [address] [place], [place] [address]     Lipid Panel No results for input(s): "CHOL", "TRIG", "HDL",  "CHOLHDL", "VLDL", "LDLCALC" in the last 72 hours.  Studies/Results: No results found.  Medications: Scheduled:   stroke: early stages of recovery book   Does not apply Once   carvedilol  3.125 mg Oral BID WC   ezetimibe  10 mg Oral Daily   icosapent Ethyl  2 g Oral BID   [START ON 07/31/2022] isosorbide mononitrate  120 mg Oral Daily   lidocaine  2 patch Transdermal Q24H   losartan  50 mg Oral Daily   pantoprazole  40 mg Oral Daily   senna-docusate  1 tablet Oral BID       Assessment: 77 yo male with a PMHx of CAD, HL, HTN, UC who presented with word finding difficulty and balance issues and was found to have a small L thalamic hemorrhage, likely hypertensive in etiology.  - MRI brain: No mass or other unexpected finding underlying the patient's left thalamic hematoma. Chronic small vessel ischemia with chronic microhemorrhages. - He has been recommended for CIR at [facility], with family expecting discharge tomorrow.  - Neurology team had recommended yesterday that DVT prophylaxis with Lovenox be started but family wished to consider this overnight. Today, after additional education and a printout of an abstract from a clinical study was provided, the patient and wife have reiterated their desire to only be treated with SCDs for DVT prophylaxis due to a fear of potential hemorrhagic complications if administered Lovenox.    Recommendations: - Goal SBP <160 - Patient and wife encouraged to keep a daily BP diary at home to record 3x/day BP and HR readings along with corresponding time and date - DVT prophylaxis with SCDs - Continue off ASA - Neurochecks and vitals per unit routine - Neurohospitalist service will sign off. Please call if there are additional questions.    LOS: 5 days   @Electronically  signed: Dr. Caryl Pina 07/30/2022  10:06 PM

## 2022-07-31 ENCOUNTER — Inpatient Hospital Stay (HOSPITAL_COMMUNITY)
Admission: RE | Admit: 2022-07-31 | Discharge: 2022-08-07 | DRG: 057 | Disposition: A | Discharge status: Home / Self Care | Payer: PPO | Source: Other Acute Inpatient Hospital | Attending: Physical Medicine & Rehabilitation | Admitting: Physical Medicine & Rehabilitation

## 2022-07-31 ENCOUNTER — Encounter (HOSPITAL_COMMUNITY): Payer: Self-pay | Admitting: Physical Medicine & Rehabilitation

## 2022-07-31 ENCOUNTER — Other Ambulatory Visit: Payer: Self-pay

## 2022-07-31 DIAGNOSIS — E785 Hyperlipidemia, unspecified: Secondary | ICD-10-CM | POA: Diagnosis not present

## 2022-07-31 DIAGNOSIS — Z79899 Other long term (current) drug therapy: Secondary | ICD-10-CM | POA: Diagnosis not present

## 2022-07-31 DIAGNOSIS — N179 Acute kidney failure, unspecified: Secondary | ICD-10-CM | POA: Diagnosis present

## 2022-07-31 DIAGNOSIS — I25810 Atherosclerosis of coronary artery bypass graft(s) without angina pectoris: Secondary | ICD-10-CM

## 2022-07-31 DIAGNOSIS — I69122 Dysarthria following nontraumatic intracerebral hemorrhage: Principal | ICD-10-CM

## 2022-07-31 DIAGNOSIS — I610 Nontraumatic intracerebral hemorrhage in hemisphere, subcortical: Secondary | ICD-10-CM | POA: Diagnosis not present

## 2022-07-31 DIAGNOSIS — R2689 Other abnormalities of gait and mobility: Secondary | ICD-10-CM | POA: Diagnosis not present

## 2022-07-31 DIAGNOSIS — I2510 Atherosclerotic heart disease of native coronary artery without angina pectoris: Secondary | ICD-10-CM | POA: Diagnosis not present

## 2022-07-31 DIAGNOSIS — Z8249 Family history of ischemic heart disease and other diseases of the circulatory system: Secondary | ICD-10-CM

## 2022-07-31 DIAGNOSIS — Z888 Allergy status to other drugs, medicaments and biological substances status: Secondary | ICD-10-CM | POA: Diagnosis not present

## 2022-07-31 DIAGNOSIS — I69198 Other sequelae of nontraumatic intracerebral hemorrhage: Secondary | ICD-10-CM

## 2022-07-31 DIAGNOSIS — Z9049 Acquired absence of other specified parts of digestive tract: Secondary | ICD-10-CM

## 2022-07-31 DIAGNOSIS — I69120 Aphasia following nontraumatic intracerebral hemorrhage: Secondary | ICD-10-CM | POA: Diagnosis not present

## 2022-07-31 DIAGNOSIS — Z8052 Family history of malignant neoplasm of bladder: Secondary | ICD-10-CM | POA: Diagnosis not present

## 2022-07-31 DIAGNOSIS — Z880 Allergy status to penicillin: Secondary | ICD-10-CM | POA: Diagnosis not present

## 2022-07-31 DIAGNOSIS — I619 Nontraumatic intracerebral hemorrhage, unspecified: Secondary | ICD-10-CM | POA: Diagnosis not present

## 2022-07-31 DIAGNOSIS — Z882 Allergy status to sulfonamides status: Secondary | ICD-10-CM

## 2022-07-31 DIAGNOSIS — Z8042 Family history of malignant neoplasm of prostate: Secondary | ICD-10-CM

## 2022-07-31 DIAGNOSIS — Z823 Family history of stroke: Secondary | ICD-10-CM | POA: Diagnosis not present

## 2022-07-31 DIAGNOSIS — I10 Essential (primary) hypertension: Secondary | ICD-10-CM | POA: Diagnosis not present

## 2022-07-31 MED ORDER — CARVEDILOL 3.125 MG PO TABS
3.1250 mg | ORAL_TABLET | Freq: Two times a day (BID) | ORAL | Status: DC
  Administered 2022-07-31 – 2022-08-07 (×14): 3.125 mg via ORAL
  Filled 2022-07-31 (×14): qty 1

## 2022-07-31 MED ORDER — ISOSORBIDE MONONITRATE ER 30 MG PO TB24
120.0000 mg | ORAL_TABLET | Freq: Every day | ORAL | Status: DC
  Administered 2022-08-01 – 2022-08-07 (×7): 120 mg via ORAL
  Filled 2022-07-31 (×8): qty 4

## 2022-07-31 MED ORDER — EZETIMIBE 10 MG PO TABS
10.0000 mg | ORAL_TABLET | Freq: Every day | ORAL | Status: DC
  Administered 2022-08-01 – 2022-08-07 (×7): 10 mg via ORAL
  Filled 2022-07-31 (×7): qty 1

## 2022-07-31 MED ORDER — ACETAMINOPHEN 160 MG/5ML PO SOLN: 650.0000 mg | ORAL | Status: DC | PRN

## 2022-07-31 MED ORDER — ICOSAPENT ETHYL 1 G PO CAPS
2.0000 g | ORAL_CAPSULE | Freq: Two times a day (BID) | ORAL | Status: DC
  Administered 2022-07-31 – 2022-08-07 (×14): 2 g via ORAL
  Filled 2022-07-31 (×14): qty 2

## 2022-07-31 MED ORDER — LOSARTAN POTASSIUM 50 MG PO TABS
50.0000 mg | ORAL_TABLET | Freq: Every day | ORAL | Status: DC
  Administered 2022-08-01 – 2022-08-07 (×7): 50 mg via ORAL
  Filled 2022-07-31 (×7): qty 1

## 2022-07-31 MED ORDER — SENNOSIDES-DOCUSATE SODIUM 8.6-50 MG PO TABS
1.0000 | ORAL_TABLET | Freq: Two times a day (BID) | ORAL | Status: DC
  Administered 2022-07-31 – 2022-08-07 (×8): 1 via ORAL
  Filled 2022-07-31 (×14): qty 1

## 2022-07-31 MED ORDER — ACETAMINOPHEN 325 MG PO TABS: 650.0000 mg | ORAL_TABLET | ORAL | Status: DC | PRN

## 2022-07-31 MED ORDER — ACETAMINOPHEN 650 MG RE SUPP: 650.0000 mg | RECTAL | Status: DC | PRN

## 2022-07-31 MED ORDER — POLYETHYLENE GLYCOL 3350 17 G PO PACK: 17.0000 g | PACK | Freq: Every day | ORAL | Status: DC | PRN

## 2022-07-31 MED ORDER — PANTOPRAZOLE SODIUM 40 MG PO TBEC
40.0000 mg | DELAYED_RELEASE_TABLET | Freq: Every day | ORAL | Status: DC
  Administered 2022-08-01 – 2022-08-07 (×7): 40 mg via ORAL
  Filled 2022-07-31 (×7): qty 1

## 2022-07-31 MED ORDER — DOCUSATE SODIUM 100 MG PO CAPS: 100.0000 mg | ORAL_CAPSULE | Freq: Two times a day (BID) | ORAL | Status: DC | PRN

## 2022-07-31 MED ORDER — LIDOCAINE 5 % EX PTCH
2.0000 | MEDICATED_PATCH | CUTANEOUS | Status: DC
  Administered 2022-07-31 – 2022-08-03 (×3): 2 via TRANSDERMAL
  Filled 2022-07-31 (×3): qty 2

## 2022-07-31 MED ORDER — BLOOD PRESSURE CONTROL BOOK
Freq: Once | Status: AC
  Filled 2022-07-31: qty 1

## 2022-07-31 NOTE — Care Management Important Message (Signed)
Important Message  Patient Details  Name: [name] MRN: [id number] Date of Birth: [birth year]   Medicare Important Message Given:  N/A - LOS <3 / Initial given by admissions     Olegario Messier A Lucero Ide 07/31/2022, 8:38 AM

## 2022-07-31 NOTE — Progress Notes (Signed)
Inpatient Rehabilitation Admissions Coordinator    I have a CIR bed at [facility] for today. I spoke with his wife by phone and she is in agreement. I have arranged [facility] transport for 12 noon and he will admit to [address] with Dr Natale Lay. I will make the arrangements. Acute team and TOC made aware.  Ottie Glazier, RN, MSN Rehab Admissions Coordinator [phone] 07/31/2022 10:14 AM

## 2022-07-31 NOTE — H&P (Signed)
Physical Medicine and Rehabilitation Admission H&P        Chief Complaint  Patient presents with   Code Stroke  : HPI: Billy Kim is a 77 year old right-handed male with history of CAD maintained on low-dose aspirin, hyperlipidemia, hypertension, ulcerative colitis that is in remission.  Per chart review patient lives with spouse.  Two-level home bed and bath main level 3 steps to entry.  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  Presented to [facility] 07/25/2022 with word finding difficulty and gait instability.  Admission chemistries unremarkable except BUN 26 creatinine 1.42, total bilirubin 2.4.  Cranial CT scan showed acute intraparenchymal hemorrhage in the left thalamus measuring 1.8 x 1.4 x 1.2 cm.  No midline shift or mass effect.  CT angiogram of the head showed no aneurysm or vascular malformation.  MRI follow-up no mass or other unexpected findings underlying the patient's left thalamic hematoma.  Initially on Cleviprex for blood pressure control.  Neurosurgery as well as neurology follow-up with conservative care.  Echo with ejection fraction of 60 to 65% no wall motion abnormalities grade 1 diastolic dysfunction.  Tolerating a regular consistency diet.  Neurology recommended DVT prophylaxis with Lovenox however patient would like to only be treated with SCDs for DVT prophylaxis.  Therapy evaluations completed due to patient decreased functional mobility was admitted for a comprehensive rehab program.  Review of Systems  Constitutional:  Negative for chills, fever and malaise/fatigue.  HENT:  Negative for congestion and hearing loss.   Eyes:  Negative for blurred vision.  Respiratory:  Negative for cough, shortness of breath and wheezing.   Cardiovascular:  Negative for chest pain, palpitations and leg swelling.  Gastrointestinal:  Positive for constipation and nausea.  Genitourinary:  Negative for dysuria, flank pain and hematuria.  Musculoskeletal:   Positive for joint pain and myalgias.  Skin:  Negative for rash.  Neurological:  Positive for speech change and headaches.       Gait instability  All other systems reviewed and are negative.  Past Medical History:  Diagnosis Date   Coronary artery disease 01/2020   Moderate proximal LAD disease (not hemodynamically significant) and CTO's of D1 and non-dominant RCA   HLD (hyperlipidemia)    Hypertension    Ulcerative colitis 5/81   Remission for years   Past Surgical History:  Procedure Laterality Date   CARDIAC CATHETERIZATION  03/20/01   Cardiolite EF 55% 02/10/02   CHOLECYSTECTOMY N/A 07/09/2017   Procedure: LAPAROSCOPIC CHOLECYSTECTOMY;  Surgeon: Jimmye Norman, MD;  Location: [facility] OR;  Service: General;  Laterality: N/A;   COLONOSCOPY  multiple   ENDOSCOPIC RETROGRADE CHOLANGIOPANCREATOGRAPHY (ERCP) WITH PROPOFOL N/A 07/08/2017   Procedure: ENDOSCOPIC RETROGRADE CHOLANGIOPANCREATOGRAPHY (ERCP) WITH PROPOFOL;  Surgeon: Lynann Bologna, MD;  Location: [facility] ENDOSCOPY;  Service: Endoscopy;  Laterality: N/A;   INGUINAL HERNIA REPAIR  04/09/06   Bilateral   LAPAROSCOPIC APPENDECTOMY  03/1981   LEFT HEART CATH AND CORONARY ANGIOGRAPHY Left 12/11/2019   Procedure: LEFT HEART CATH AND CORONARY ANGIOGRAPHY;  Surgeon: Yvonne Kendall, MD;  Location: [facility] INVASIVE CV LAB;  Service: Cardiovascular;  Laterality: Left;   REMOVAL OF STONES  07/08/2017   Procedure: REMOVAL OF STONES;  Surgeon: Lynann Bologna, MD;  Location: [facility] ENDOSCOPY;  Service: Endoscopy;;   SPHINCTEROTOMY  07/08/2017   Procedure: Dennison Mascot;  Surgeon: Lynann Bologna, MD;  Location: [facility] ENDOSCOPY;  Service: Endoscopy;;   Family History  Problem Relation Age of Onset   Cancer Mother        ?  GYN, died after hysterectomy   Stroke Father    CAD Father    Hypertension Sister    Prostate cancer Brother    CAD Brother 18       CABG   Bladder Cancer Brother 78       Bladder   Hypertension Brother    Hypertension Brother     Hypertension Brother    Hypertension Brother    Hypertension Brother    Bone cancer Paternal Grandfather    Hypertension Son    Prostate cancer Nephew    Bladder Cancer Nephew    Colon cancer Neg Hx    Esophageal cancer Neg Hx    Rectal cancer Neg Hx    Stomach cancer Neg Hx    Social History:  reports that he has never smoked. He has never used smokeless tobacco. He reports that he does not drink alcohol and does not use drugs. Allergies:  Allergies  Allergen Reactions   Amoxicillin Other (See Comments)    Caused headache Has patient had a PCN reaction causing immediate rash, facial/tongue/throat swelling, SOB or lightheadedness with hypotension: No Has patient had a PCN reaction causing severe rash involving mucus membranes or skin necrosis: No Has patient had a PCN reaction that required hospitalization: No Has patient had a PCN reaction occurring within the last 10 years: No If all of the above answers are "NO", then may proceed with Cephalosporin use.   Lyrica [Pregabalin]     Intolerant, worsening headache   Morphine Sulfate Other (See Comments)    headache   Neurontin [Gabapentin] Other (See Comments)    headache   Sulfonamide Derivatives Other (See Comments)    headache   Medications Prior to Admission  Medication Sig Dispense Refill   Ascorbic Acid (VITAMIN C) 1000 MG tablet Take 1,000 mg by mouth daily.     benzonatate (TESSALON) 200 MG capsule Take 1 capsule (200 mg total) by mouth 3 (three) times daily as needed for cough. 15 capsule 0   carvedilol (COREG) 3.125 MG tablet TAKE 1 TABLET(3.125 MG) BY MOUTH TWICE DAILY (Patient taking differently: Take 3.125 mg by mouth 2 (two) times daily with a meal.) 180 tablet 3   cholecalciferol (VITAMIN D3) 25 MCG (1000 UNIT) tablet Take 2,000 Units by mouth daily.      ezetimibe (ZETIA) 10 MG tablet Take 1 tablet (10 mg total) by mouth daily. 90 tablet 3   fluticasone (FLONASE) 50 MCG/ACT nasal spray Place 2 sprays into both  nostrils daily. 16 g 0   icosapent Ethyl (VASCEPA) 1 g capsule Take 2 capsules (2 g total) by mouth 2 (two) times daily. 120 capsule 3   isosorbide mononitrate (IMDUR) 120 MG 24 hr tablet TAKE 1 TABLET(120 MG) BY MOUTH DAILY (Patient taking differently: Take 120 mg by mouth daily.) 90 tablet 0   losartan (COZAAR) 50 MG tablet Take 1 tablet (50 mg total) by mouth daily. 90 tablet 3   meclizine (ANTIVERT) 25 MG tablet Take 0.5-1 tablet (12.5 mg- 25 mg) by mouth every 6 hours as needed for dizziness 25 tablet 0   nitroGLYCERIN (NITROSTAT) 0.4 MG SL tablet Place 1 tablet (0.4 mg total) under the tongue every 5 (five) minutes as needed for chest pain. Maximum of 3 doses. 25 tablet 1   omeprazole (PRILOSEC) 20 MG capsule Take 20 mg by mouth daily as needed.      Home: Home Living Family/patient expects to be discharged to:: Private residence Living Arrangements: Spouse/significant other Available Help  at Discharge: Family, Available 24 hours/day Type of Home: House Home Access: Stairs to enter [employer] of Steps: 3 to 5 Entrance Stairs-Rails: Right, Left, Can reach both Home Layout: Two level, Able to live on main level with bedroom/bathroom Bathroom Shower/Tub: Engineer, manufacturing systems: Standard Home Equipment: None  Lives With: Spouse   Functional History: Prior Function Prior Level of Function : Independent/Modified Independent, Working/employed, Driving Mobility Comments: working full time in Occupational hygienist Status:  Mobility: Bed Mobility Overal bed mobility: Modified Independent Bed Mobility: Supine to Sit Supine to sit: Supervision, HOB elevated General bed mobility comments: Patient in bed, transitioned from long sitting to EOB with supervison. No use of bed rails. Transfers Overall transfer level: Needs assistance Equipment used: None Transfers: Sit to/from Stand Sit to Stand: Min guard General transfer comment: STS from EOB without AD. min  guard for R leaning/drifting. Ambulation/Gait Ambulation/Gait assistance: Min guard Gait Distance (Feet): 170 Feet Assistive device: Rolling walker (2 wheels) Gait Pattern/deviations: Step-through pattern, Shuffle, Drifts right/left, Trunk flexed General Gait Details: improved R foot clearance, RW used for approximately 80 ft with min guard. RW discarded with min guard the rest of the distance. 1 BP check at 131/71, no patient reported symptoms. Gait velocity: decreased   ADL: ADL Overall ADL's : Needs assistance/impaired Grooming: Wash/dry face, Oral care, Min guard, Cueing for sequencing, Cueing for safety Toileting- Clothing Manipulation and Hygiene: Minimal assistance, Sit to/from stand Toileting - Clothing Manipulation Details (indicate cue type and reason): simulated   Cognition: Cognition Overall Cognitive Status: Impaired/Different from baseline Arousal/Alertness: Awake/alert Orientation Level: Oriented X4 Cognition Arousal/Alertness: Awake/alert Behavior During Therapy: WFL for tasks assessed/performed Overall Cognitive Status: Impaired/Different from baseline Area of Impairment: Awareness Awareness: Emergent General Comments: Pt decreased situational awareness during ambulating in a moderate stimulating environment. Difficult to assess due to: Impaired communication     Physical Exam: Blood pressure 135/80, pulse 61, temperature 98.5 F (36.9 C), temperature source Oral, resp. rate 18, height 5\' 8"  (1.727 m), weight 84.5 kg, SpO2 99 %.    General: Alert and oriented x 3, No apparent distress HEENT: Head is normocephalic, atraumatic, PERRLA, EOMI, sclera anicteric, oral mucosa pink and moist, dentition intact, ext ear canals clear, wearing glasses Neck: Supple without JVD or lymphadenopathy Heart: Reg rate and rhythm. No murmurs rubs or gallops Chest: CTA bilaterally without wheezes, rales, or rhonchi; no distress Abdomen: Soft, non-tender, non-distended, bowel  sounds positive. Extremities: No clubbing, cyanosis, or edema. Pulses are 2+ Psych: Pt's affect is appropriate. Pt is cooperative Skin: Clean and intact without signs of breakdown Neuro: Alert and oriented x 3, follows commands, cranial nerves II through XII intact, mild expressive deficits.   Slightly delayed responses.  Mild dysarthria noted.  Able to name and repeat.  Fair insight and awareness  Strength 5 out of 5 in bilateral upper and lower extremities Sensation intact light touch in all 4 extremities DTR normal and symmetric bilaterally No tremors noted No pronator drift noted Finger-nose intact bilaterally Musculoskeletal:  Normal muscle bulk, no abnormal tone noted No joint swelling or tenderness noted  Results for orders placed or performed during the hospital encounter of 07/25/22 (from the past 48 hour(s))  CBC     Status: Abnormal   Collection Time: 07/30/22  5:25 AM  Result Value Ref Range   WBC 6.2 4.0 - 10.5 K/uL   RBC 4.68 4.22 - 5.81 MIL/uL   Hemoglobin 14.2 13.0 - 17.0 g/dL   HCT 16.1 09.6 -  52.0 %   MCV 88.0 80.0 - 100.0 fL   MCH 30.3 26.0 - 34.0 pg   MCHC 34.5 30.0 - 36.0 g/dL   RDW 16.1 09.6 - 04.5 %   Platelets 126 (L) 150 - 400 K/uL   nRBC 0.0 0.0 - 0.2 %    Comment: Performed at [facility], [address], [place], [place] [address]  Basic metabolic panel     Status: Abnormal   Collection Time: 07/30/22  5:25 AM  Result Value Ref Range   Sodium 140 135 - 145 mmol/L   Potassium 3.9 3.5 - 5.1 mmol/L   Chloride 108 98 - 111 mmol/L   CO2 25 22 - 32 mmol/L   Glucose, Bld 117 (H) 70 - 99 mg/dL    Comment: Glucose reference range applies only to samples taken after fasting for at least 8 hours.   BUN 31 (H) 8 - 23 mg/dL   Creatinine, Ser 1.91 (H) 0.61 - 1.24 mg/dL   Calcium 8.4 (L) 8.9 - 10.3 mg/dL   GFR, Estimated 56 (L) >60 mL/min    Comment: (NOTE) Calculated using the CKD-EPI Creatinine Equation (2021)    Anion gap 7 5 - 15    Comment:  Performed at [facility], [address] [place], [place] [address]   No results found.    Blood pressure 135/80, pulse 61, temperature 98.5 F (36.9 C), temperature source Oral, resp. rate 18, height 5\' 8"  (1.727 m), weight 84.5 kg, SpO2 99 %.  Medical Problem List and Plan: 1. Functional deficits secondary to intraparenchymal hemorrhage in the left thalamus without mass effect.  -patient may  shower  -ELOS/Goals: 5-7 days,  Sup with PT/OT/SLP  -Admit to CIR  2.  Antithrombotics: -DVT/anticoagulation:  Mechanical: Antiembolism stockings, thigh (TED hose) Bilateral lower extremities Patient has declined Lovenox therapy  -antiplatelet therapy: Aspirin currently on hold 3. Pain Management: Lidoderm patch  4. Mood/Behavior/Sleep: Provide emotional support  -antipsychotic agents: N/A 5. Neuropsych/cognition: This patient is able of making decisions on his own behalf. 6. Skin/Wound Care: Routine skin checks 7. Fluids/Electrolytes/Nutrition: Routine in and outs with follow-up chemistries 8.  Hypertension.  Cozaar 50 mg daily, Coreg 3.125 mg twice daily.  Monitor with increased mobility.  Goal SBP < 160 9.  Hyperlipidemia.  Zetia/Vascepa 10.  History of CAD.  No chest pain or shortness of breath.  Baby aspirin on hold.  Continue Imdur 120 mg daily 11.  History of ulcerative colitis.  Protonix 40 mg daily. 12. AKI. Improving. Rechecks labs tomorrow.  I have personally performed a face to face diagnostic evaluation of this patient and formulated the key components of the plan.  Additionally, I have personally reviewed laboratory data, imaging studies, as well as relevant notes and concur with the physician assistant's documentation above.  The patient's status has not changed from the original H&P.  Any changes in documentation from the acute care chart have been noted above.  Fanny Dance, MD, [place]    Mcarthur Rossetti Angiulli, PA-C 07/31/2022

## 2022-07-31 NOTE — Progress Notes (Signed)
Billy Dance, MD  Physician Physical Medicine and Rehabilitation   PMR Pre-admission     Signed   Date of Service: 07/27/2022  3:05 PM  Related encounter: ED to Hosp-Admission (Discharged) from 07/25/2022 in [facility] 1C MEDICAL TELEMETRY   Signed      Show:Clear all [x] Written[x] Templated[x] Copied  Added by: [x] Standley Brooking, RN[x] Beryle Beams  [] Hover for details PMR Admission Coordinator Pre-Admission Assessment   Patient: Billy Kim is an 77 y.o., male MRN: [id number] DOB: 03-12-1945 Height: 5\' 8"  (172.7 cm) Weight: 84.1 kg                                                                                                                                                  Insurance Information HMO:     PPO: yes     PCP:      IPA:      80/20:      OTHER:  PRIMARY: [employer]      Policy#: [id number]      Subscriber: pt CM Name: Judeth Cornfield      Phone#: [phone]     Fax#: [phone] Pre-Cert#: [id number]    approved for 7 days f/u on 6/18  Employer:  Benefits:  Phone #: [phone]     Name: 6/7 Eff. Date: 02/19/22     Deduct: none      Out of Pocket Max: $3200        CIR: $295 co pay per day days 1 until 6      SNF: no copay per day days 1 until 20; $203 co pay per day days 21 until 100 Outpatient: $15 per visit     Co-Pay: visits per medical neccesity Home Health: 100%      Co-Pay:  DME: 80%     Co-Pay: 20% Providers: in network  SECONDARY: none   Financial Counselor:       Phone#:    The Data processing manager" for patients in Inpatient Rehabilitation Facilities with attached "Privacy Act Statement-Health Care Records" was provided and verbally reviewed with: Patient and Family   Emergency Contact Information Contact Information       Name Relation Home Work Mobile    Satterfield-Palecek,[name] Spouse [phone]    [place], Hearn [phone]             Current Medical  History  Patient Admitting Diagnosis: CVA   History of Present Illness: 77 year old male with history of CAD, HLD, HTN, ulcerative colitis who presented to [facility] on 07/25/22 with word finding difficulty and balance issues. Noted slight right facial droop.    Imaging revealed left thalamic hemorrhage. No history of trauma. Admission chemistries unremarkable except BUN 26 creatinine 1.42, total bilirubin 2.4.  Cranial CT scan showed acute intraparenchymal hemorrhage in the left thalamus measuring 1.8  x 1.4 x 1.2 cm.  No midline shift or mass effect.  CT angiogram of the head showed no aneurysm or vascular malformation.  MRI follow-up no mass or other unexpected findings underlying the patient's left thalamic hematoma.  Initially on Cleviprex for blood pressure control.  Neurosurgery as well as neurology follow-up with conservative care.  Echo with ejection fraction of 60 to 65% no wall motion abnormalities grade 1 diastolic dysfunction.  Tolerating a regular consistency diet.      Complete NIHSS TOTAL: 0 Glasgow Coma Scale Score: 15   Patient's medical record from [facility] has been reviewed by the rehabilitation admission coordinator and physician.   Past Medical History      Past Medical History:  Diagnosis Date   Coronary artery disease 01/2020    Moderate proximal LAD disease (not hemodynamically significant) and CTO's of D1 and non-dominant RCA   HLD (hyperlipidemia)     Hypertension     Ulcerative colitis 5/81    Remission for years    Has the patient had major surgery during 100 days prior to admission? No   Family History  family history includes Bladder Cancer in his nephew; Bladder Cancer (age of onset: 68) in his brother; Bone cancer in his paternal grandfather; CAD in his father; CAD (age of onset: 24) in his brother; Cancer in his mother; Hypertension in his brother, brother, brother, brother, brother, sister, and son; Prostate cancer in his brother and nephew; Stroke in his father.    Current Medications    Current Facility-Administered Medications:     stroke: early stages of recovery book, , Does not apply, Once, Jefferson Fuel, MD   acetaminophen (TYLENOL) tablet 650 mg, 650 mg, Oral, Q4H PRN **OR** acetaminophen (TYLENOL) 160 MG/5ML solution 650 mg, 650 mg, Per Tube, Q4H PRN **OR** acetaminophen (TYLENOL) suppository 650 mg, 650 mg, Rectal, Q4H PRN, Jefferson Fuel, MD   carvedilol (COREG) tablet 3.125 mg, 3.125 mg, Oral, BID WC, Jaynie Bream, RPH, 3.125 mg at 07/30/22 1622   docusate sodium (COLACE) capsule 100 mg, 100 mg, Oral, BID PRN, Rust-Chester, Cecelia Byars, NP   ezetimibe (ZETIA) tablet 10 mg, 10 mg, Oral, Daily, Jaynie Bream, RPH, 10 mg at 07/30/22 4098   hydrALAZINE (APRESOLINE) injection 10 mg, 10 mg, Intravenous, Q2H PRN, Raechel Chute, MD   icosapent Ethyl (VASCEPA) 1 g capsule 2 g, 2 g, Oral, BID, Jaynie Bream, RPH, 2 g at 07/30/22 0837   [START ON 07/31/2022] isosorbide mononitrate (IMDUR) 24 hr tablet 120 mg, 120 mg, Oral, Daily, Agbata, Tochukwu, MD   isosorbide mononitrate (IMDUR) 24 hr tablet 60 mg, 60 mg, Oral, Once, Agbata, Tochukwu, MD   lidocaine (LIDODERM) 5 % 2 patch, 2 patch, Transdermal, Q24H, Jefferson Fuel, MD, 2 patch at 07/30/22 1622   losartan (COZAAR) tablet 50 mg, 50 mg, Oral, Daily, Jaynie Bream, RPH, 50 mg at 07/30/22 0837   pantoprazole (PROTONIX) EC tablet 40 mg, 40 mg, Oral, Daily, Jaynie Bream, RPH, 40 mg at 07/30/22 1191   polyethylene glycol (MIRALAX / GLYCOLAX) packet 17 g, 17 g, Oral, Daily PRN, Rust-Chester, Cecelia Byars, NP   senna-docusate (Senokot-S) tablet 1 tablet, 1 tablet, Oral, BID, Jefferson Fuel, MD, 1 tablet at 07/29/22 4782   Patients Current Diet:  Diet Order                  Diet regular Room service appropriate? Yes with Assist; Fluid consistency: Thin  Diet effective now  Precautions / Restrictions Precautions Precautions: Fall Precaution  Comments: R inattention, mild R hemi Restrictions Weight Bearing Restrictions: No    Has the patient had 2 or more falls or a fall with injury in the past year?No   Prior Activity Level Community (5-7x/wk): Independent, working and driving   Prior Functional Level Prior Function Prior Level of Function : Independent/Modified Independent, Working/employed, Driving Mobility Comments: working full time in [employer]   Self Care: Did the patient need help bathing, dressing, using the toilet or eating?  Independent   Indoor Mobility: Did the patient need assistance with walking from room to room (with or without device)? Independent   Stairs: Did the patient need assistance with internal or external stairs (with or without device)? Independent   Functional Cognition: Did the patient need help planning regular tasks such as shopping or remembering to take medications? Independent   Patient Information   Patient's Response To:  Health Literacy and Transportation Is the patient able to respond to health literacy and transportation needs?: Yes Health Literacy - How often do you need to have someone help you when you read instructions, pamphlets, or other written material from your doctor or pharmacy?: Never In the past 12 months, has lack of transportation kept you from medical appointments or from getting medications?: No In the past 12 months, has lack of transportation kept you from meetings, work, or from getting things needed for daily living?: No   Journalist, newspaper / Equipment Home Assistive Devices/Equipment: Eyeglasses Home Equipment: None   Prior Device Use: Indicate devices/aids used by the patient prior to current illness, exacerbation or injury? None of the above   Current Functional Level Cognition   Arousal/Alertness: Awake/alert Overall Cognitive Status: Impaired/Different from baseline Difficult to assess due to: Impaired communication Orientation Level:  Oriented X4 General Comments: Pt decreased situational awareness during ambulating in a moderate stimulating environment.    Extremity Assessment (includes Sensation/Coordination)   Upper Extremity Assessment: Overall WFL for tasks assessed RUE Deficits / Details: decreased Inattiention RUE Coordination: decreased fine motor  Lower Extremity Assessment: Overall WFL for tasks assessed     ADLs   Overall ADL's : Needs assistance/impaired Grooming: Wash/dry face, Oral care, Min guard, Cueing for sequencing, Cueing for safety Toileting- Clothing Manipulation and Hygiene: Minimal assistance, Sit to/from stand Toileting - Clothing Manipulation Details (indicate cue type and reason): simulated     Mobility   Overal bed mobility: Modified Independent Bed Mobility: Supine to Sit Supine to sit: Supervision, HOB elevated General bed mobility comments: Patient in bed, transitioned from long sitting to EOB with supervison. No use of bed rails.     Transfers   Overall transfer level: Needs assistance Equipment used: None Transfers: Sit to/from Stand Sit to Stand: Min guard General transfer comment: STS from EOB without AD. min guard for R leaning/drifting.     Ambulation / Gait / Stairs / Wheelchair Mobility   Ambulation/Gait Ambulation/Gait assistance: Land (Feet): 170 Feet Assistive device: Rolling walker (2 wheels) Gait Pattern/deviations: Step-through pattern, Shuffle, Drifts right/left, Trunk flexed General Gait Details: improved R foot clearance, RW used for approximately 80 ft with min guard. RW discarded with min guard the rest of the distance. 1 BP check at 131/71, no patient reported symptoms. Gait velocity: decreased     Posture / Balance Dynamic Sitting Balance Sitting balance - Comments: sitting EOB with no UE support for 2 minutes during coordination testing. Balance Overall balance assessment: Needs assistance Sitting-balance support:  Feet  supported Sitting balance-Leahy Scale: Good Sitting balance - Comments: sitting EOB with no UE support for 2 minutes during coordination testing. Standing balance support: No upper extremity supported, During functional activity Standing balance-Leahy Scale: Good Standing balance comment: Good standing balance while reaching overhead and for items on the ground.     Special needs/care consideration          Previous Home Environment  Living Arrangements: Spouse/significant other  Lives With: Spouse Available Help at Discharge: Family, Available 24 hours/day Type of Home: House Home Layout: Two level, Able to live on main level with bedroom/bathroom Home Access: Stairs to enter Entrance Stairs-Rails: Right, Left, Can reach both Entrance Stairs-Number of Steps: 3 to 5 Bathroom Shower/Tub: Engineer, manufacturing systems: Standard How Accessible: Accessible via walker Home Care Services: No   Discharge Living Setting Plans for Discharge Living Setting: Patient's home, Lives with (comment) (wife) Type of Home at Discharge: House Discharge Home Layout: Two level, Able to live on main level with bedroom/bathroom Discharge Home Access: Stairs to enter Entrance Stairs-Rails: Right, Left, Can reach both Entrance Stairs-Number of Steps: 3 Discharge Bathroom Shower/Tub: Tub/shower unit Discharge Bathroom Toilet: Standard Discharge Bathroom Accessibility: Yes How Accessible: Accessible via walker Does the patient have any problems obtaining your medications?: No   Social/Family/Support Systems Patient Roles: Spouse (employee) Contact Information: wife, Clinical cytogeneticist Anticipated Caregiver: wife Anticipated Industrial/product designer Information: see contacts Ability/Limitations of Caregiver: no limitations Caregiver Availability: 24/7 Discharge Plan Discussed with Primary Caregiver: Yes Is Caregiver In Agreement with Plan?: Yes Does Caregiver/Family have Issues with Lodging/Transportation while Pt  is in Rehab?: No   Goals Patient/Family Goal for Rehab: supervision with PT, OT and SLP Expected length of stay: ELOS 5 to 7 days Pt/Family Agrees to Admission and willing to participate: Yes Program Orientation Provided & Reviewed with Pt/Caregiver Including Roles  & Responsibilities: Yes   Decrease burden of Care through IP rehab admission: n/a   Possible need for SNF placement upon discharge:not anticipated   Patient Condition: This patient's medical and functional status has changed since the consult dated: 07/27/22 in which the Rehabilitation Physician determined and documented that the patient's condition is appropriate for intensive rehabilitative care in an inpatient rehabilitation facility. See "History of Present Illness" (above) for medical update. Functional changes are: min guard assist overall. Patient's medical and functional status update has been discussed with the Rehabilitation physician and patient remains appropriate for inpatient rehabilitation. Will admit to inpatient rehab today.   Preadmission Screen Completed By:  Ottie Glazier RN MSN, 07/30/2022 5:01 PM ______________________________________________________________________   Discussed status with Dr. Natale Lay on 07/31/22 at 1021 and received approval for admission today.   Admission Coordinator:  Ottie Glazier  RN MSN time 1610 Date 07/31/22            Revision History

## 2022-07-31 NOTE — Progress Notes (Signed)
Horton Chin, MD  Physician Physical Medicine and Rehabilitation   Consult Note     Signed   Date of Service: 07/27/2022 12:37 PM  Related encounter: ED to Hosp-Admission (Discharged) from 07/25/2022 in [facility] 1C MEDICAL TELEMETRY   Signed     Expand All Collapse All  Show:Clear all [x] Written[x] Templated[] Copied  Added by: [x] Raulkar, Drema Pry, MD  [] Hover for details          Physical Medicine and Rehabilitation Consult Reason for Consult: ICH Referring Physician: Erin Fulling, MD     HPI: Billy Kim is a 77 y.o. male who was admitted on 07/25/22 with an acute small left thalamic hemorrhage in the setting of uncontrolled hypertension. Repeat CT on 6/6 showed worsening hemorrhage. PMH includes CAD, ulcerative colitis, HTN, and HLD. Physical Medicine & Rehabilitation was consulted to assess candidacy for CIR.       ROS +impaired balance, gait, expressive communication     Past Medical History:  Diagnosis Date   Coronary artery disease 01/2020    Moderate proximal LAD disease (not hemodynamically significant) and CTO's of D1 and non-dominant RCA   HLD (hyperlipidemia)     Hypertension     Ulcerative colitis 5/81    Remission for years         Past Surgical History:  Procedure Laterality Date   CARDIAC CATHETERIZATION   03/20/01    Cardiolite EF 55% 02/10/02   CHOLECYSTECTOMY N/A 07/09/2017    Procedure: LAPAROSCOPIC CHOLECYSTECTOMY;  Surgeon: Jimmye Norman, MD;  Location: [facility] OR;  Service: General;  Laterality: N/A;   COLONOSCOPY   multiple   ENDOSCOPIC RETROGRADE CHOLANGIOPANCREATOGRAPHY (ERCP) WITH PROPOFOL N/A 07/08/2017    Procedure: ENDOSCOPIC RETROGRADE CHOLANGIOPANCREATOGRAPHY (ERCP) WITH PROPOFOL;  Surgeon: Lynann Bologna, MD;  Location: [facility] ENDOSCOPY;  Service: Endoscopy;  Laterality: N/A;   INGUINAL HERNIA REPAIR   04/09/06    Bilateral   LAPAROSCOPIC APPENDECTOMY   03/1981   LEFT HEART CATH AND CORONARY ANGIOGRAPHY Left  12/11/2019    Procedure: LEFT HEART CATH AND CORONARY ANGIOGRAPHY;  Surgeon: Yvonne Kendall, MD;  Location: [facility] INVASIVE CV LAB;  Service: Cardiovascular;  Laterality: Left;   REMOVAL OF STONES   07/08/2017    Procedure: REMOVAL OF STONES;  Surgeon: Lynann Bologna, MD;  Location: [facility] ENDOSCOPY;  Service: Endoscopy;;   SPHINCTEROTOMY   07/08/2017    Procedure: Dennison Mascot;  Surgeon: Lynann Bologna, MD;  Location: [facility] ENDOSCOPY;  Service: Endoscopy;;         Family History  Problem Relation Age of Onset   Cancer Mother          ? GYN, died after hysterectomy   Stroke Father     CAD Father     Hypertension Sister     Prostate cancer Brother     CAD Brother 53        CABG   Bladder Cancer Brother 65        Bladder   Hypertension Brother     Hypertension Brother     Hypertension Brother     Hypertension Brother     Hypertension Brother     Bone cancer Paternal Grandfather     Hypertension Son     Prostate cancer Nephew     Bladder Cancer Nephew     Colon cancer Neg Hx     Esophageal cancer Neg Hx     Rectal cancer Neg Hx     Stomach cancer Neg Hx  Social History:  reports that he has never smoked. He has never used smokeless tobacco. He reports that he does not drink alcohol and does not use drugs. Allergies:       Allergies  Allergen Reactions   Amoxicillin Other (See Comments)      Caused headache Has patient had a PCN reaction causing immediate rash, facial/tongue/throat swelling, SOB or lightheadedness with hypotension: No Has patient had a PCN reaction causing severe rash involving mucus membranes or skin necrosis: No Has patient had a PCN reaction that required hospitalization: No Has patient had a PCN reaction occurring within the last 10 years: No If all of the above answers are "NO", then may proceed with Cephalosporin use.   Lyrica [Pregabalin]        Intolerant, worsening headache   Morphine Sulfate Other (See Comments)      headache   Neurontin  [Gabapentin] Other (See Comments)      headache   Sulfonamide Derivatives Other (See Comments)      headache          Medications Prior to Admission  Medication Sig Dispense Refill   Ascorbic Acid (VITAMIN C) 1000 MG tablet Take 1,000 mg by mouth daily.       aspirin EC 81 MG tablet Take 1 tablet (81 mg total) by mouth daily. Swallow whole. 90 tablet 3   benzonatate (TESSALON) 200 MG capsule Take 1 capsule (200 mg total) by mouth 3 (three) times daily as needed for cough. 15 capsule 0   carvedilol (COREG) 3.125 MG tablet TAKE 1 TABLET(3.125 MG) BY MOUTH TWICE DAILY (Patient taking differently: Take 3.125 mg by mouth 2 (two) times daily with a meal.) 180 tablet 3   cholecalciferol (VITAMIN D3) 25 MCG (1000 UNIT) tablet Take 2,000 Units by mouth daily.        Coenzyme Q10 100 MG capsule Take 100 mg by mouth daily.       ezetimibe (ZETIA) 10 MG tablet Take 1 tablet (10 mg total) by mouth daily. 90 tablet 3   fluticasone (FLONASE) 50 MCG/ACT nasal spray Place 2 sprays into both nostrils daily. 16 g 0   Garlic 500 MG TABS Take 500 mg by mouth daily.       icosapent Ethyl (VASCEPA) 1 g capsule Take 2 capsules (2 g total) by mouth 2 (two) times daily. 120 capsule 3   isosorbide mononitrate (IMDUR) 120 MG 24 hr tablet TAKE 1 TABLET(120 MG) BY MOUTH DAILY (Patient taking differently: Take 120 mg by mouth daily.) 90 tablet 0   losartan (COZAAR) 50 MG tablet Take 1 tablet (50 mg total) by mouth daily. 90 tablet 3   meclizine (ANTIVERT) 25 MG tablet Take 0.5-1 tablet (12.5 mg- 25 mg) by mouth every 6 hours as needed for dizziness 25 tablet 0   nitroGLYCERIN (NITROSTAT) 0.4 MG SL tablet Place 1 tablet (0.4 mg total) under the tongue every 5 (five) minutes as needed for chest pain. Maximum of 3 doses. 25 tablet 1   omeprazole (PRILOSEC) 20 MG capsule Take 20 mg by mouth daily as needed.       famotidine (PEPCID) 20 MG tablet Take 1 tablet (20 mg total) by mouth 2 (two) times daily as needed for heartburn or  indigestion. (Patient not taking: Reported on 07/25/2022)       tiZANidine (ZANAFLEX) 4 MG tablet Take 0.5-1 tablets (2-4 mg total) by mouth every 8 (eight) hours as needed for muscle spasms (sedation caution). (Patient not taking: Reported on  07/25/2022) 30 tablet 0      Home: Home Living Family/patient expects to be discharged to:: Private residence Living Arrangements: Spouse/significant other Available Help at Discharge: Family, Available 24 hours/day Type of Home: House Home Access: Stairs to enter [employer] of Steps: 3 Entrance Stairs-Rails: Right, Left, Can reach both Home Layout: Two level, Able to live on main level with bedroom/bathroom Home Equipment: Other (comment)  Lives With: Spouse  Functional History: Prior Function Prior Level of Function : Independent/Modified Independent, Working/employed, Driving Mobility Comments: working full time in [employer], sport and exercise Status:  Mobility: Bed Mobility Overal bed mobility: Needs Assistance Bed Mobility: Supine to Sit Supine to sit: Supervision, Min guard, HOB elevated General bed mobility comments: extra time for supine>sit with HOB elevated Transfers Overall transfer level: Needs assistance Equipment used: None Transfers: Sit to/from Stand Sit to Stand: Min assist General transfer comment: STS from EOB, recliner without AD Ambulation/Gait Ambulation/Gait assistance: Min assist Gait Distance (Feet): 100 Feet Assistive device: None Gait Pattern/deviations: Decreased stride length, Decreased dorsiflexion - right, Decreased step length - right, Decreased weight shift to left General Gait Details: decreased foot clearance RLE. PT provides cuing for corrected gait pattern with RLE with pt only able to demonstrate x 1 step,  otherwise reverts back to impaired gait pattern as noted above. Pt with R inattention, bumping doorway on R side x 2. Gait velocity: decreased   ADL:   Cognition: Cognition Overall  Cognitive Status: Difficult to assess Arousal/Alertness: Awake/alert Orientation Level: Oriented X4 Cognition Arousal/Alertness: Awake/alert Behavior During Therapy: WFL for tasks assessed/performed Overall Cognitive Status: Difficult to assess General Comments: Pt follows simple commands throughout session with extra time PRN. Difficult to assess due to: Impaired communication (2/2 expressive deficits)   Blood pressure 133/79, pulse 68, temperature 98.2 F (36.8 C), resp. rate 18, height 5\' 8"  (1.727 m), weight 93.5 kg, SpO2 95 %. Physical Exam Gen: no distress, normal appearing HEENT: oral mucosa pink and moist, NCAT Cardio: Reg rate Chest: normal effort, normal rate of breathing Abd: soft, non-distended Ext: no edema Psych: pleasant, normal affect Skin: intact Neuro: Impaired balance and gait, expressive aphasia, right sided inattention. 5/5 strength except for decreased strength in right dorsiflexion   Lab Results Last 24 Hours       Results for orders placed or performed during the hospital encounter of 07/25/22 (from the past 24 hour(s))  Glucose, capillary     Status: Abnormal    Collection Time: 07/26/22  3:28 PM  Result Value Ref Range    Glucose-Capillary 110 (H) 70 - 99 mg/dL  Glucose, capillary     Status: Abnormal    Collection Time: 07/26/22  7:32 PM  Result Value Ref Range    Glucose-Capillary 127 (H) 70 - 99 mg/dL  Glucose, capillary     Status: Abnormal    Collection Time: 07/26/22 11:45 PM  Result Value Ref Range    Glucose-Capillary 130 (H) 70 - 99 mg/dL  Glucose, capillary     Status: Abnormal    Collection Time: 07/27/22  4:12 AM  Result Value Ref Range    Glucose-Capillary 122 (H) 70 - 99 mg/dL  Glucose, capillary     Status: Abnormal    Collection Time: 07/27/22  7:15 AM  Result Value Ref Range    Glucose-Capillary 123 (H) 70 - 99 mg/dL  Basic metabolic panel     Status: Abnormal    Collection Time: 07/27/22  8:33 AM  Result Value Ref Range  Sodium 139 135 - 145 mmol/L    Potassium 3.9 3.5 - 5.1 mmol/L    Chloride 107 98 - 111 mmol/L    CO2 25 22 - 32 mmol/L    Glucose, Bld 112 (H) 70 - 99 mg/dL    BUN 28 (H) 8 - 23 mg/dL    Creatinine, Ser 1.61 (H) 0.61 - 1.24 mg/dL    Calcium 8.8 (L) 8.9 - 10.3 mg/dL    GFR, Estimated 49 (L) >60 mL/min    Anion gap 7 5 - 15  Glucose, capillary     Status: Abnormal    Collection Time: 07/27/22 11:03 AM  Result Value Ref Range    Glucose-Capillary 145 (H) 70 - 99 mg/dL       Imaging Results (Last 48 hours)  ECHOCARDIOGRAM COMPLETE   Result Date: 07/26/2022    ECHOCARDIOGRAM REPORT   Patient Name:   KANAI DEBOLT Poulter Date of Exam: 07/26/2022 Medical Rec #:  [id number]    Height:       68.0 in Accession #:    [id number]   Weight:       206.1 lb Date of Birth:  02/18/1946     BSA:          2.070 m Patient Age:    77 years     BP:           146/83 mmHg Patient Gender: M            HR:           63 bpm. Exam Location:  [facility] Procedure: 2D Echo, Color Doppler and Cardiac Doppler Indications:     Stroke I63.9  History:         Patient has prior history of Echocardiogram examinations, most                  recent 10/02/2019. CAD; Risk Factors:Dyslipidemia and                  Hypertension.  Sonographer:     Cristela Blue Referring Phys:  [id number] Malachi Carl STACK Diagnosing Phys: Julien Nordmann MD  Sonographer Comments: Suboptimal apical window. Apicals are off axis. IMPRESSIONS  1. Left ventricular ejection fraction, by estimation, is 60 to 65%. The left ventricle has normal function. The left ventricle has no regional wall motion abnormalities. Left ventricular diastolic parameters are consistent with Grade I diastolic dysfunction (impaired relaxation).  2. Right ventricular systolic function is normal. The right ventricular size is normal. There is normal pulmonary artery systolic pressure. The estimated right ventricular systolic pressure is 15.8 mmHg.  3. The mitral valve is normal in structure. No evidence of mitral  valve regurgitation. No evidence of mitral stenosis.  4. The aortic valve is normal in structure. Aortic valve regurgitation is not visualized. No aortic stenosis is present.  5. The inferior vena cava is normal in size with greater than 50% respiratory variability, suggesting right atrial pressure of 3 mmHg. FINDINGS  Left Ventricle: Left ventricular ejection fraction, by estimation, is 60 to 65%. The left ventricle has normal function. The left ventricle has no regional wall motion abnormalities. The left ventricular internal cavity size was normal in size. There is  no left ventricular hypertrophy. Left ventricular diastolic parameters are consistent with Grade I diastolic dysfunction (impaired relaxation). Right Ventricle: The right ventricular size is normal. No increase in right ventricular wall thickness. Right ventricular systolic function is normal. There is normal pulmonary artery systolic pressure. The tricuspid  regurgitant velocity is 1.64 m/s, and  with an assumed right atrial pressure of 5 mmHg, the estimated right ventricular systolic pressure is 15.8 mmHg. Left Atrium: Left atrial size was normal in size. Right Atrium: Right atrial size was normal in size. Pericardium: There is no evidence of pericardial effusion. Mitral Valve: The mitral valve is normal in structure. No evidence of mitral valve regurgitation. No evidence of mitral valve stenosis. MV peak gradient, 3.4 mmHg. The mean mitral valve gradient is 2.0 mmHg. Tricuspid Valve: The tricuspid valve is normal in structure. Tricuspid valve regurgitation is not demonstrated. No evidence of tricuspid stenosis. Aortic Valve: The aortic valve is normal in structure. Aortic valve regurgitation is not visualized. No aortic stenosis is present. Aortic valve mean gradient measures 2.0 mmHg. Aortic valve peak gradient measures 3.6 mmHg. Pulmonic Valve: The pulmonic valve was normal in structure. Pulmonic valve regurgitation is not visualized. No evidence  of pulmonic stenosis. Aorta: The aortic root is normal in size and structure. Venous: The inferior vena cava is normal in size with greater than 50% respiratory variability, suggesting right atrial pressure of 3 mmHg. IAS/Shunts: No atrial level shunt detected by color flow Doppler.  LEFT VENTRICLE PLAX 2D LVIDd:         4.10 cm   Diastology LVIDs:         2.70 cm   LV e' medial:    5.98 cm/s LV PW:         1.20 cm   LV E/e' medial:  9.7 LV IVS:        1.10 cm   LV e' lateral:   11.00 cm/s LVOT diam:     2.00 cm   LV E/e' lateral: 5.3 LVOT Area:     3.14 cm  RIGHT VENTRICLE RV Basal diam:  2.80 cm RV Mid diam:    2.60 cm LEFT ATRIUM             Index        RIGHT ATRIUM           Index LA diam:        3.90 cm 1.88 cm/m   RA Area:     12.00 cm LA Vol (A2C):   40.9 ml 19.76 ml/m  RA Volume:   21.30 ml  10.29 ml/m LA Vol (A4C):   66.3 ml 32.03 ml/m LA Biplane Vol: 53.6 ml 25.89 ml/m  AORTIC VALVE AV Vmax:      94.55 cm/s AV Vmean:     65.550 cm/s AV VTI:       0.197 m AV Peak Grad: 3.6 mmHg AV Mean Grad: 2.0 mmHg  AORTA Ao Root diam: 3.60 cm MITRAL VALVE               TRICUSPID VALVE MV Area (PHT): 2.36 cm    TR Peak grad:   10.8 mmHg MV Peak grad:  3.4 mmHg    TR Vmax:        164.00 cm/s MV Mean grad:  2.0 mmHg MV Vmax:       0.92 m/s    SHUNTS MV Vmean:      57.7 cm/s   Systemic Diam: 2.00 cm MV Decel Time: 321 msec MV E velocity: 58.20 cm/s MV A velocity: 88.00 cm/s MV E/A ratio:  0.66 Julien Nordmann MD Electronically signed by Julien Nordmann MD Signature Date/Time: 07/26/2022/3:15:32 PM    Final     MR BRAIN W WO CONTRAST   Result Date: 07/26/2022 CLINICAL  DATA:  Follow-up intracranial hemorrhage. EXAM: MRI HEAD WITHOUT AND WITH CONTRAST TECHNIQUE: Multiplanar, multiecho pulse sequences of the brain and surrounding structures were obtained without and with intravenous contrast. CONTRAST:  8mL GADAVIST GADOBUTROL 1 MMOL/ML IV SOLN COMPARISON:  CT and CTA from yesterday. FINDINGS: Brain: Known acute  hemorrhage in the left thalamus measuring up to 2 cm. Faint rim of enhancement which is likely reactive, no spot sign or vascular lesion seen underlying the hematoma on recent CTA. No underlying masslike findings. There is a small rim of edema. No intraventricular extension. Mild FLAIR hyperintensity in the cerebral white matter for age, attributed to chronic small vessel ischemia. There are chronic microhemorrhages primarily in the deep brain and best attributed to chronic hypertension. No hydrocephalus. Vascular: Major flow voids and vascular enhancements are preserved Skull and upper cervical spine: Normal marrow signal Sinuses/Orbits: Negative IMPRESSION: 1. No mass or other unexpected finding underlying the patient's left thalamic hematoma. 2. Chronic small vessel ischemia with chronic microhemorrhages. Electronically Signed   By: Tiburcio Pea M.D.   On: 07/26/2022 07:55    CT HEAD WO CONTRAST ( )   Result Date: 07/26/2022 CLINICAL DATA:  Intracranial hemorrhage follow up EXAM: CT HEAD WITHOUT CONTRAST TECHNIQUE: Contiguous axial images were obtained from the base of the skull through the vertex without intravenous contrast. RADIATION DOSE REDUCTION: This exam was performed according to the departmental dose-optimization program which includes automated exposure control, adjustment of the mA and/or kV according to patient size and/or use of iterative reconstruction technique. COMPARISON:  07/25/2022 at 6:57 p.m. FINDINGS: Brain: Left thalamic intraparenchymal hematoma has slightly increased in size to 2.3 x 1.6 x 1.4 cm (2.5 mL, previously 1.5 mL). The examination is otherwise unchanged. Vascular: No hyperdense vessel or unexpected calcification. Skull: Normal. Negative for fracture or focal lesion. Sinuses/Orbits: No acute finding. Other: None. IMPRESSION: Left thalamic intraparenchymal hematoma has slightly increased in size to 2.3 x 1.6 x 1.4 cm (2.5 mL, previously 1.5 mL). The examination is  otherwise unchanged. Electronically Signed   By: Deatra Robinson M.D.   On: 07/26/2022 00:58    CT ANGIO HEAD CODE STROKE   Result Date: 07/25/2022 CLINICAL DATA:  Acute neurologic deficit.  Intracranial hemorrhage. EXAM: CT ANGIOGRAPHY HEAD TECHNIQUE: Multidetector CT imaging of the head was performed using the standard protocol during bolus administration of intravenous contrast. Multiplanar CT image reconstructions and MIPs were obtained to evaluate the vascular anatomy. RADIATION DOSE REDUCTION: This exam was performed according to the departmental dose-optimization program which includes automated exposure control, adjustment of the mA and/or kV according to patient size and/or use of iterative reconstruction technique. CONTRAST:  75mL OMNIPAQUE IOHEXOL 350 MG/ML SOLN COMPARISON:  None Available. FINDINGS: POSTERIOR CIRCULATION: --Vertebral arteries: Normal --Inferior cerebellar arteries: Normal. --Basilar artery: Normal. --Superior cerebellar arteries: Normal. --Posterior cerebral arteries: Normal. ANTERIOR CIRCULATION: --Intracranial internal carotid arteries: Normal. --Anterior cerebral arteries (ACA): Normal. --Middle cerebral arteries (MCA): Normal. Venous sinuses: As permitted by contrast timing, patent. Anatomic variants: None Review of the MIP images confirms the above findings. IMPRESSION: 1. Normal CTA of the head.  No aneurysm or vascular malformation. 2. Unchanged appearance of left thalamic intraparenchymal hematoma, most consistent with hypertensive hemorrhage. Electronically Signed   By: Deatra Robinson M.D.   On: 07/25/2022 21:09    CT HEAD CODE STROKE WO CONTRAST   Result Date: 07/25/2022 CLINICAL DATA:  Code stroke.  Acute neurologic deficit EXAM: CT HEAD WITHOUT CONTRAST TECHNIQUE: Contiguous axial images were obtained from the base of the skull  through the vertex without intravenous contrast. RADIATION DOSE REDUCTION: This exam was performed according to the departmental dose-optimization  program which includes automated exposure control, adjustment of the mA and/or kV according to patient size and/or use of iterative reconstruction technique. COMPARISON:  None Available. FINDINGS: Brain: Acute intraparenchymal hemorrhage in the left thalamus measuring 1.8 x 1.4 x 1.2 cm (approximately 1.5 mL). Minimal edema. No midline shift or other mass effect. The size and configuration of the ventricles and extra-axial CSF spaces are normal. The brain parenchyma is normal, without evidence of acute or chronic infarction. Vascular: No abnormal hyperdensity of the major intracranial arteries or dural venous sinuses. No intracranial atherosclerosis. Skull: The visualized skull base, calvarium and extracranial soft tissues are normal. Sinuses/Orbits: No fluid levels or advanced mucosal thickening of the visualized paranasal sinuses. No mastoid or middle ear effusion. The orbits are normal. ASPECTS ([place] Stroke Program Early CT Score) Not reported in the setting of acute hemorrhage. IMPRESSION: 1. Acute intraparenchymal hemorrhage in the left thalamus measuring 1.8 x 1.4 x 1.2 cm (approximately 1.5 mL). 2. No midline shift or other mass effect. Critical Value/emergent results were called by telephone at the time of interpretation on 07/25/2022 at 7:06 pm to provider [facility] , who verbally acknowledged these results. Electronically Signed   By: Deatra Robinson M.D.   On: 07/25/2022 19:07       Assessment/Plan: Diagnosis: ICH Does the need for close, 24 hr/day medical supervision in concert with the patient's rehab needs make it unreasonable for this patient to be served in a less intensive setting? Yes Co-Morbidities requiring supervision/potential complications:  1) Impaired balance: would benefit from CIR PT 2) Right sided inattention: would benefit from CIR OT 3) Right sided impaired dorsiflexion: may benefit from AFO consultation 4) Expressive aphasia 5) Tachypnea Due to bladder management,  bowel management, safety, skin/wound care, disease management, medication administration, pain management, and patient education, does the patient require 24 hr/day rehab nursing? Yes Does the patient require coordinated care of a physician, rehab nurse, therapy disciplines of PT, OT, SLP to address physical and functional deficits in the context of the above medical diagnosis(es)? Yes Addressing deficits in the following areas: balance, endurance, locomotion, strength, transferring, bowel/bladder control, bathing, dressing, feeding, grooming, toileting, language, and psychosocial support Can the patient actively participate in an intensive therapy program of at least 3 hrs of therapy per day at least 5 days per week? Yes The potential for patient to make measurable gains while on inpatient rehab is excellent Anticipated functional outcomes upon discharge from inpatient rehab are supervision  with PT, supervision with OT, supervision with SLP. Estimated rehab length of stay to reach the above functional goals is: 5-7 days Anticipated discharge destination: Home Overall Rehab/Functional Prognosis: excellent   POST ACUTE RECOMMENDATIONS: This patient's condition is appropriate for continued rehabilitative care in the following setting: CIR Patient has agreed to participate in recommended program. Yes Note that insurance prior authorization may be required for reimbursement for recommended care.     I have personally performed a face to face diagnostic evaluation of this patient. Additionally, I have examined the patient's medical record including any pertinent labs and radiographic images. If the physician assistant has documented in this note, I have reviewed and edited or otherwise concur with the physician assistant's documentation.   Thanks,   Horton Chin, MD 07/27/2022          Routing History

## 2022-07-31 NOTE — Discharge Summary (Signed)
Physician Discharge Summary   Patient: Billy Kim MRN: [id number] DOB: 08/12/1945  Admit date:     07/25/2022  Discharge date: 07/31/22  Discharge Physician: Naveed Humphres   PCP: Joaquim Nam, MD   Recommendations at discharge:   Take medications as recommended  Discharge Diagnoses: Principal Problem:   Nontraumatic subcortical hemorrhage of left cerebral hemisphere [facility]) Active Problems:   AKI (acute kidney injury) (HCC)   Hypertensive emergency  Resolved Problems:   * No resolved hospital problems. [facility] Course:  Patient was in his normal state of health when he took a shower on the evening evening of his admission around 16:50.  Upon getting out of the shower around 17:00 his wife noticed he seemed " off".  While attempting to get dressed he seemed to have difficulty putting on his socks, also some balance difficulties requiring him to hold onto the bed & when his son called to speak with him he was having difficulty " getting the words out".  She also thinks that she noticed some slight slurred speech.  The patient describes not feeling like himself and knowing something was wrong.  He denied headache, blurred vision, dizziness, sick symptoms or sick contacts.  He did endorse feeling off balance, having difficulty getting out what he wanted to say and possible weakness in bilateral lower extremities. Of note patient is being followed for hypertension.  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   Assessment and Plan: Hemorrhagic stroke Left thalamic hemorrhage Hypertensive emergency Patient presented with dysarthria and word finding difficulty, imaging showed a small left thalamic bleed, likely secondary to hypertensive emergency.  Patient was admitted to the ICU and required clevidipine drip. He has been weaned off the drip and is currently normotensive.  Blood pressure  medications have been adjusted to maintain systolic blood pressure less than 150 Repeat CT scan of the head without contrast shows similar size of a left thalamic intraparenchymal hemorrhage. No progressive mass effect. Appreciate neurology input and recommendations to maintain systolic blood pressure of less than Patient's symptoms have improved Appreciate PT recommendations, for discharge to [facility]  Continue Imdur, losartan and carvedilol     History of coronary artery disease With chronic total occlusions of D1 and nondominant RCA as well as moderate proximal LAD Medical management recommended Continue beta-blockers and nitrates       AKI On admission. Baseline serum creatinine 1.19 and it was 1.38 on admission Losartan was resumed Monitor renal function closely     Consultants: Neurology, pulmonary critical care Procedures performed: 2D echocardiogram Disposition: Rehabilitation facility Diet recommendation:  Discharge Diet Orders (From admission, onward)     Start     Ordered   07/31/22 0000  Diet - low sodium heart healthy        07/31/22 1119           Cardiac diet DISCHARGE MEDICATION: Allergies as of 07/31/2022       Reactions   Amoxicillin Other (See Comments)   Caused headache Has patient had a PCN reaction causing immediate rash, facial/tongue/throat swelling, SOB or lightheadedness with hypotension: No Has patient had a PCN reaction causing severe rash involving mucus membranes or skin necrosis: No Has patient had a PCN reaction that required hospitalization: No Has patient had a PCN reaction occurring within the last 10 years: No If all of the  above answers are "NO", then may proceed with Cephalosporin use.   Lyrica [pregabalin]    Intolerant, worsening headache   Morphine Sulfate Other (See Comments)   headache   Neurontin [gabapentin] Other (See Comments)   headache   Sulfonamide Derivatives Other (See Comments)   headache         Medication List     STOP taking these medications    aspirin EC 81 MG tablet   Coenzyme Q10 100 MG capsule   famotidine 20 MG tablet Commonly known as: Pepcid   Garlic 500 MG Tabs   tiZANidine 4 MG tablet Commonly known as: Zanaflex       TAKE these medications    benzonatate 200 MG capsule Commonly known as: TESSALON Take 1 capsule (200 mg total) by mouth 3 (three) times daily as needed for cough.   carvedilol 3.125 MG tablet Commonly known as: COREG TAKE 1 TABLET(3.125 MG) BY MOUTH TWICE DAILY What changed: See the new instructions.   cholecalciferol 25 MCG (1000 UNIT) tablet Commonly known as: VITAMIN D3 Take 2,000 Units by mouth daily.   ezetimibe 10 MG tablet Commonly known as: ZETIA Take 1 tablet (10 mg total) by mouth daily.   fluticasone 50 MCG/ACT nasal spray Commonly known as: FLONASE Place 2 sprays into both nostrils daily.   icosapent Ethyl 1 g capsule Commonly known as: Vascepa Take 2 capsules (2 g total) by mouth 2 (two) times daily.   isosorbide mononitrate 120 MG 24 hr tablet Commonly known as: IMDUR TAKE 1 TABLET(120 MG) BY MOUTH DAILY What changed: See the new instructions.   losartan 50 MG tablet Commonly known as: COZAAR Take 1 tablet (50 mg total) by mouth daily.   meclizine 25 MG tablet Commonly known as: ANTIVERT Take 0.5-1 tablet (12.5 mg- 25 mg) by mouth every 6 hours as needed for dizziness   nitroGLYCERIN 0.4 MG SL tablet Commonly known as: Nitrostat Place 1 tablet (0.4 mg total) under the tongue every 5 (five) minutes as needed for chest pain. Maximum of 3 doses.   omeprazole 20 MG capsule Commonly known as: PRILOSEC Take 20 mg by mouth daily as needed.   vitamin C 1000 MG tablet Take 1,000 mg by mouth daily.        Discharge Exam: Filed Weights   07/28/22 0313 07/30/22 0500 07/31/22 0446  Weight: 81.1 kg 84.1 kg 84.5 kg   General: He is not in acute distress.    Appearance: Normal appearance. He  is not ill-appearing.  HENT:     Nose: Nose normal.     Mouth/Throat:     Mouth: Mucous membranes are moist.  Eyes:     General: No visual field deficit. Cardiovascular:     Rate and Rhythm: Normal rate and regular rhythm.     Pulses: Normal pulses.     Heart sounds: Normal heart sounds.  Pulmonary:     Effort: Pulmonary effort is normal.     Breath sounds: Normal breath sounds.  Abdominal:     Palpations: Abdomen is soft.  Neurological:     Mental Status: He is alert.     GCS: GCS eye subscore is 4. GCS verbal subscore is 5. GCS motor subscore is 6.     Cranial Nerves: Cranial nerves 2-12 are intact. No cranial nerve deficit, dysarthria or facial asymmetry.     Sensory: Sensation is intact. No sensory deficit.     Motor: Motor function is intact. No weakness, tremor, atrophy, abnormal muscle tone  or seizure activity.  Able to move all extremities    Coordination: Coordination is intact. Coordination normal. Finger-Nose-Finger Test and Heel to [facility] Test normal. Rapid alternating movements normal.     Deep Tendon Reflexes: Reflexes are normal and symmetric.     Condition at discharge: stable  The results of significant diagnostics from this hospitalization (including imaging, microbiology, ancillary and laboratory) are listed below for reference.   Imaging Studies: CT HEAD WO CONTRAST ( )  Result Date: 07/27/2022 CLINICAL DATA:  Neuro deficit, acute, stroke suspected increasing R hemiparesis and hemineglect in patient with L thalamic IPH EXAM: CT HEAD WITHOUT CONTRAST TECHNIQUE: Contiguous axial images were obtained from the base of the skull through the vertex without intravenous contrast. RADIATION DOSE REDUCTION: This exam was performed according to the departmental dose-optimization program which includes automated exposure control, adjustment of the mA and/or kV according to patient size and/or use of iterative reconstruction technique. COMPARISON:  CT head June 6, 24.  FINDINGS: Brain: Similar size of a left thalamic intraparenchymal hemorrhage. No progressive mass effect. No evidence of acute large vascular territory infarct, midline shift, or hydrocephalus. Vascular: No hyperdense vessel. Skull: No acute fracture. Sinuses/Orbits: Clear sinuses.  No acute findings. Other: No mastoid effusions. IMPRESSION: Similar size of a left thalamic intraparenchymal hemorrhage. No progressive mass effect. Electronically Signed   By: Feliberto Harts M.D.   On: 07/27/2022 17:38   ECHOCARDIOGRAM COMPLETE  Result Date: 07/26/2022    ECHOCARDIOGRAM REPORT   Patient Name:   Billy Kim Date of Exam: 07/26/2022 Medical Rec #:  [id number]    Height:       68.0 in Accession #:    [id number]   Weight:       206.1 lb Date of Birth:  1945/12/31     BSA:          2.070 m Patient Age:    77 years     BP:           146/83 mmHg Patient Gender: M            HR:           63 bpm. Exam Location:  [facility] Procedure: 2D Echo, Color Doppler and Cardiac Doppler Indications:     Stroke I63.9  History:         Patient has prior history of Echocardiogram examinations, most                  recent 10/02/2019. CAD; Risk Factors:Dyslipidemia and                  Hypertension.  Sonographer:     Cristela Blue Referring Phys:  [id number] Malachi Carl STACK Diagnosing Phys: Julien Nordmann MD  Sonographer Comments: Suboptimal apical window. Apicals are off axis. IMPRESSIONS  1. Left ventricular ejection fraction, by estimation, is 60 to 65%. The left ventricle has normal function. The left ventricle has no regional wall motion abnormalities. Left ventricular diastolic parameters are consistent with Grade I diastolic dysfunction (impaired relaxation).  2. Right ventricular systolic function is normal. The right ventricular size is normal. There is normal pulmonary artery systolic pressure. The estimated right ventricular systolic pressure is 15.8 mmHg.  3. The mitral valve is normal in structure. No evidence of mitral valve regurgitation.  No evidence of mitral stenosis.  4. The aortic valve is normal in structure. Aortic valve regurgitation is not visualized. No aortic stenosis is present.  5. The inferior vena cava is normal in  size with greater than 50% respiratory variability, suggesting right atrial pressure of 3 mmHg. FINDINGS  Left Ventricle: Left ventricular ejection fraction, by estimation, is 60 to 65%. The left ventricle has normal function. The left ventricle has no regional wall motion abnormalities. The left ventricular internal cavity size was normal in size. There is  no left ventricular hypertrophy. Left ventricular diastolic parameters are consistent with Grade I diastolic dysfunction (impaired relaxation). Right Ventricle: The right ventricular size is normal. No increase in right ventricular wall thickness. Right ventricular systolic function is normal. There is normal pulmonary artery systolic pressure. The tricuspid regurgitant velocity is 1.64 m/s, and  with an assumed right atrial pressure of 5 mmHg, the estimated right ventricular systolic pressure is 15.8 mmHg. Left Atrium: Left atrial size was normal in size. Right Atrium: Right atrial size was normal in size. Pericardium: There is no evidence of pericardial effusion. Mitral Valve: The mitral valve is normal in structure. No evidence of mitral valve regurgitation. No evidence of mitral valve stenosis. MV peak gradient, 3.4 mmHg. The mean mitral valve gradient is 2.0 mmHg. Tricuspid Valve: The tricuspid valve is normal in structure. Tricuspid valve regurgitation is not demonstrated. No evidence of tricuspid stenosis. Aortic Valve: The aortic valve is normal in structure. Aortic valve regurgitation is not visualized. No aortic stenosis is present. Aortic valve mean gradient measures 2.0 mmHg. Aortic valve peak gradient measures 3.6 mmHg. Pulmonic Valve: The pulmonic valve was normal in structure. Pulmonic valve regurgitation is not visualized. No evidence of pulmonic stenosis.  Aorta: The aortic root is normal in size and structure. Venous: The inferior vena cava is normal in size with greater than 50% respiratory variability, suggesting right atrial pressure of 3 mmHg. IAS/Shunts: No atrial level shunt detected by color flow Doppler.  LEFT VENTRICLE PLAX 2D LVIDd:         4.10 cm   Diastology LVIDs:         2.70 cm   LV e' medial:    5.98 cm/s LV PW:         1.20 cm   LV E/e' medial:  9.7 LV IVS:        1.10 cm   LV e' lateral:   11.00 cm/s LVOT diam:     2.00 cm   LV E/e' lateral: 5.3 LVOT Area:     3.14 cm  RIGHT VENTRICLE RV Basal diam:  2.80 cm RV Mid diam:    2.60 cm LEFT ATRIUM             Index        RIGHT ATRIUM           Index LA diam:        3.90 cm 1.88 cm/m   RA Area:     12.00 cm LA Vol (A2C):   40.9 ml 19.76 ml/m  RA Volume:   21.30 ml  10.29 ml/m LA Vol (A4C):   66.3 ml 32.03 ml/m LA Biplane Vol: 53.6 ml 25.89 ml/m  AORTIC VALVE AV Vmax:      94.55 cm/s AV Vmean:     65.550 cm/s AV VTI:       0.197 m AV Peak Grad: 3.6 mmHg AV Mean Grad: 2.0 mmHg  AORTA Ao Root diam: 3.60 cm MITRAL VALVE               TRICUSPID VALVE MV Area (PHT): 2.36 cm    TR Peak grad:   10.8 mmHg MV Peak grad:  3.4 mmHg    TR Vmax:        164.00 cm/s MV Mean grad:  2.0 mmHg MV Vmax:       0.92 m/s    SHUNTS MV Vmean:      57.7 cm/s   Systemic Diam: 2.00 cm MV Decel Time: 321 msec MV E velocity: 58.20 cm/s MV A velocity: 88.00 cm/s MV E/A ratio:  0.66 Julien Nordmann MD Electronically signed by Julien Nordmann MD Signature Date/Time: 07/26/2022/3:15:32 PM    Final    MR BRAIN W WO CONTRAST  Result Date: 07/26/2022 CLINICAL DATA:  Follow-up intracranial hemorrhage. EXAM: MRI HEAD WITHOUT AND WITH CONTRAST TECHNIQUE: Multiplanar, multiecho pulse sequences of the brain and surrounding structures were obtained without and with intravenous contrast. CONTRAST:  8mL GADAVIST GADOBUTROL 1 MMOL/ML IV SOLN COMPARISON:  CT and CTA from yesterday. FINDINGS: Brain: Known acute hemorrhage in the left thalamus  measuring up to 2 cm. Faint rim of enhancement which is likely reactive, no spot sign or vascular lesion seen underlying the hematoma on recent CTA. No underlying masslike findings. There is a small rim of edema. No intraventricular extension. Mild FLAIR hyperintensity in the cerebral white matter for age, attributed to chronic small vessel ischemia. There are chronic microhemorrhages primarily in the deep brain and best attributed to chronic hypertension. No hydrocephalus. Vascular: Major flow voids and vascular enhancements are preserved Skull and upper cervical spine: Normal marrow signal Sinuses/Orbits: Negative IMPRESSION: 1. No mass or other unexpected finding underlying the patient's left thalamic hematoma. 2. Chronic small vessel ischemia with chronic microhemorrhages. Electronically Signed   By: Tiburcio Pea M.D.   On: 07/26/2022 07:55   CT HEAD WO CONTRAST ( )  Result Date: 07/26/2022 CLINICAL DATA:  Intracranial hemorrhage follow up EXAM: CT HEAD WITHOUT CONTRAST TECHNIQUE: Contiguous axial images were obtained from the base of the skull through the vertex without intravenous contrast. RADIATION DOSE REDUCTION: This exam was performed according to the departmental dose-optimization program which includes automated exposure control, adjustment of the mA and/or kV according to patient size and/or use of iterative reconstruction technique. COMPARISON:  07/25/2022 at 6:57 p.m. FINDINGS: Brain: Left thalamic intraparenchymal hematoma has slightly increased in size to 2.3 x 1.6 x 1.4 cm (2.5 mL, previously 1.5 mL). The examination is otherwise unchanged. Vascular: No hyperdense vessel or unexpected calcification. Skull: Normal. Negative for fracture or focal lesion. Sinuses/Orbits: No acute finding. Other: None. IMPRESSION: Left thalamic intraparenchymal hematoma has slightly increased in size to 2.3 x 1.6 x 1.4 cm (2.5 mL, previously 1.5 mL). The examination is otherwise unchanged. Electronically  Signed   By: Deatra Robinson M.D.   On: 07/26/2022 00:58   CT ANGIO HEAD CODE STROKE  Result Date: 07/25/2022 CLINICAL DATA:  Acute neurologic deficit.  Intracranial hemorrhage. EXAM: CT ANGIOGRAPHY HEAD TECHNIQUE: Multidetector CT imaging of the head was performed using the standard protocol during bolus administration of intravenous contrast. Multiplanar CT image reconstructions and MIPs were obtained to evaluate the vascular anatomy. RADIATION DOSE REDUCTION: This exam was performed according to the departmental dose-optimization program which includes automated exposure control, adjustment of the mA and/or kV according to patient size and/or use of iterative reconstruction technique. CONTRAST:  75mL OMNIPAQUE IOHEXOL 350 MG/ML SOLN COMPARISON:  None Available. FINDINGS: POSTERIOR CIRCULATION: --Vertebral arteries: Normal --Inferior cerebellar arteries: Normal. --Basilar artery: Normal. --Superior cerebellar arteries: Normal. --Posterior cerebral arteries: Normal. ANTERIOR CIRCULATION: --Intracranial internal carotid arteries: Normal. --Anterior cerebral arteries (ACA): Normal. --Middle cerebral arteries (MCA): Normal. Venous sinuses: As  permitted by contrast timing, patent. Anatomic variants: None Review of the MIP images confirms the above findings. IMPRESSION: 1. Normal CTA of the head.  No aneurysm or vascular malformation. 2. Unchanged appearance of left thalamic intraparenchymal hematoma, most consistent with hypertensive hemorrhage. Electronically Signed   By: Deatra Robinson M.D.   On: 07/25/2022 21:09   CT HEAD CODE STROKE WO CONTRAST  Result Date: 07/25/2022 CLINICAL DATA:  Code stroke.  Acute neurologic deficit EXAM: CT HEAD WITHOUT CONTRAST TECHNIQUE: Contiguous axial images were obtained from the base of the skull through the vertex without intravenous contrast. RADIATION DOSE REDUCTION: This exam was performed according to the departmental dose-optimization program which includes automated  exposure control, adjustment of the mA and/or kV according to patient size and/or use of iterative reconstruction technique. COMPARISON:  None Available. FINDINGS: Brain: Acute intraparenchymal hemorrhage in the left thalamus measuring 1.8 x 1.4 x 1.2 cm (approximately 1.5 mL). Minimal edema. No midline shift or other mass effect. The size and configuration of the ventricles and extra-axial CSF spaces are normal. The brain parenchyma is normal, without evidence of acute or chronic infarction. Vascular: No abnormal hyperdensity of the major intracranial arteries or dural venous sinuses. No intracranial atherosclerosis. Skull: The visualized skull base, calvarium and extracranial soft tissues are normal. Sinuses/Orbits: No fluid levels or advanced mucosal thickening of the visualized paranasal sinuses. No mastoid or middle ear effusion. The orbits are normal. ASPECTS (Alberta Stroke Program Early CT Score) Not reported in the setting of acute hemorrhage. IMPRESSION: 1. Acute intraparenchymal hemorrhage in the left thalamus measuring 1.8 x 1.4 x 1.2 cm (approximately 1.5 mL). 2. No midline shift or other mass effect. Critical Value/emergent results were called by telephone at the time of interpretation on 07/25/2022 at 7:06 pm to provider [facility] , who verbally acknowledged these results. Electronically Signed   By: Deatra Robinson M.D.   On: 07/25/2022 19:07    Microbiology: Results for orders placed or performed during the hospital encounter of 07/25/22  MRSA Next Gen by PCR, Nasal     Status: None   Collection Time: 07/25/22  8:38 PM   Specimen: Nasal Mucosa; Nasal Swab  Result Value Ref Range Status   MRSA by PCR Next Gen NOT DETECTED NOT DETECTED Final    Comment: (NOTE) The GeneXpert MRSA Assay (FDA approved for NASAL specimens only), is one component of a comprehensive MRSA colonization surveillance program. It is not intended to diagnose MRSA infection nor to guide or monitor treatment for MRSA  infections. Test performance is not FDA approved in patients less than 55 years old. Performed at [facility], [address] [place], [place] [address]     Labs: CBC: Recent Labs  Lab 07/25/22 1849 07/26/22 0300 07/30/22 0525  WBC 5.6 6.9 6.2  NEUTROABS 3.6  --   --   HGB 14.8 17.4* 14.2  HCT 42.3 48.8 41.2  MCV 86.7 84.6 88.0  PLT 125* 117* 126*   Basic Metabolic Panel: Recent Labs  Lab 07/25/22 1849 07/26/22 0300 07/27/22 0833 07/30/22 0525  NA 140 140 139 140  K 4.3 4.4 3.9 3.9  CL 106 107 107 108  CO2 26 24 25 25   GLUCOSE 107* 110* 112* 117*  BUN 26* 22 28* 31*  CREATININE 1.42* 1.15 1.46* 1.31*  CALCIUM 8.8* 8.8* 8.8* 8.4*  MG  --  2.4  --   --   PHOS  --  2.9  --   --    Liver Function  Tests: Recent Labs  Lab 07/25/22 1849  AST 24  ALT 18  ALKPHOS 65  BILITOT 2.4*  PROT 6.8  ALBUMIN 4.0   CBG: Recent Labs  Lab 07/27/22 2011 07/28/22 0406 07/28/22 0849 07/28/22 1144 07/29/22 0818  GLUCAP 109* 112* 111* 135* 90    Discharge time spent: greater than 30 minutes.  Signed: Lucile Shutters, MD [facility] 07/31/2022

## 2022-07-31 NOTE — TOC Transition Note (Signed)
Transition of Care [facility]) - CM/SW Discharge Note   Patient Details  Name: Billy Kim MRN: [id number] Date of Birth: [birth year]  Transition of Care [facility]) CM/SW Contact:  Allena Katz, LCSW Phone Number: 07/31/2022, 9:59 AM   Clinical Narrative:    Pt has orders to discharge to CIR. Medical necessity printed to the unit. CIR to call [facility]. CSW signing off.     Final next level of care:  (CIR) Barriers to Discharge: Barriers Resolved   Patient Goals and CMS Choice CMS Medicare.gov Compare Post Acute Care list provided to:: Patient Choice offered to / list presented to : Patient  Discharge Placement                  Patient to be transferred to facility by: [facility]      Discharge Plan and Services Additional resources added to the After Visit Summary for                                       Social Determinants of Health (SDOH) Interventions SDOH Screenings   Food Insecurity: No Food Insecurity (08/04/2020)  Housing: Low Risk  (08/04/2020)  Transportation Needs: No Transportation Needs (08/04/2020)  Alcohol Screen: Low Risk  (08/04/2020)  Depression (PHQ2-9): Low Risk  (08/08/2021)  Financial Resource Strain: Low Risk  (08/04/2020)  Physical Activity: Inactive (08/04/2020)  Stress: No Stress Concern Present (08/04/2020)  Tobacco Use: Low Risk  (07/25/2022)     Readmission Risk Interventions     No data to display

## 2022-08-01 ENCOUNTER — Telehealth: Payer: Self-pay | Admitting: Internal Medicine

## 2022-08-01 DIAGNOSIS — I619 Nontraumatic intracerebral hemorrhage, unspecified: Secondary | ICD-10-CM | POA: Diagnosis not present

## 2022-08-01 LAB — COMPREHENSIVE METABOLIC PANEL
ALT: 31 U/L (ref 0–44)
AST: 25 U/L (ref 15–41)
Albumin: 3.1 g/dL — ABNORMAL LOW (ref 3.5–5.0)
Alkaline Phosphatase: 63 U/L (ref 38–126)
Anion gap: 7 (ref 5–15)
BUN: 21 mg/dL (ref 8–23)
CO2: 25 mmol/L (ref 22–32)
Calcium: 8.4 mg/dL — ABNORMAL LOW (ref 8.9–10.3)
Chloride: 107 mmol/L (ref 98–111)
Creatinine, Ser: 1.38 mg/dL — ABNORMAL HIGH (ref 0.61–1.24)
GFR, Estimated: 52 mL/min — ABNORMAL LOW (ref >60)
Glucose, Bld: 107 mg/dL — ABNORMAL HIGH (ref 70–99)
Potassium: 4.1 mmol/L (ref 3.5–5.1)
Sodium: 139 mmol/L (ref 135–145)
Total Bilirubin: 1.6 mg/dL — ABNORMAL HIGH (ref 0.3–1.2)
Total Protein: 5.7 g/dL — ABNORMAL LOW (ref 6.5–8.1)

## 2022-08-01 LAB — CBC WITH DIFFERENTIAL/PLATELET
Abs Immature Granulocytes: 0.02 10*3/uL (ref 0.00–0.07)
Basophils Absolute: 0.1 10*3/uL (ref 0.0–0.1)
Basophils Relative: 1 %
Eosinophils Absolute: 0.3 10*3/uL (ref 0.0–0.5)
Eosinophils Relative: 4 %
HCT: 41.4 % (ref 39.0–52.0)
Hemoglobin: 14.6 g/dL (ref 13.0–17.0)
Immature Granulocytes: 0 %
Lymphocytes Relative: 21 %
Lymphs Abs: 1.3 10*3/uL (ref 0.7–4.0)
MCH: 30 pg (ref 26.0–34.0)
MCHC: 35.3 g/dL (ref 30.0–36.0)
MCV: 85.2 fL (ref 80.0–100.0)
Monocytes Absolute: 0.7 10*3/uL (ref 0.1–1.0)
Monocytes Relative: 11 %
Neutro Abs: 3.8 10*3/uL (ref 1.7–7.7)
Neutrophils Relative %: 63 %
Platelets: 116 10*3/uL — ABNORMAL LOW (ref 150–400)
RBC: 4.86 MIL/uL (ref 4.22–5.81)
RDW: 12.8 % (ref 11.5–15.5)
WBC: 6.1 10*3/uL (ref 4.0–10.5)
nRBC: 0 % (ref 0.0–0.2)

## 2022-08-01 LAB — GLUCOSE, CAPILLARY: Glucose-Capillary: 96 mg/dL (ref 70–99)

## 2022-08-01 NOTE — Progress Notes (Signed)
   08/01/22 1000  Spiritual Encounters  Type of Visit Initial  Care provided to: Patient  Referral source Nurse (RN/NT/LPN)  Reason for visit Advance directives  OnCall Visit No   Ch responded to request for AD. There was not family present at bedside. Pt declined AD. No follow-up needed at this time.

## 2022-08-01 NOTE — Plan of Care (Signed)
  Problem: RH Expression Communication Goal: LTG Patient will express needs/wants via multi-modal(SLP) Description: LTG:  Patient will express needs/wants via multi-modal communication (gestures/written, etc) with cues (SLP) Flowsheets (Taken 08/01/2022 1246) LTG: Patient will express needs/wants via multimodal communication (gestures/written, etc) with cueing (SLP): Supervision Goal: LTG Patient will verbally express basic/complex needs(SLP) Description: LTG:  Patient will verbally express basic/complex needs, wants or ideas with cues  (SLP) Flowsheets (Taken 08/01/2022 1246) LTG: Patient will verbally express basic/complex needs, wants or ideas (SLP):  Supervision  Minimal Assistance - Patient > 75% Goal: LTG Patient will increase word finding of common (SLP) Description: LTG:  Patient will increase word finding of common objects/daily info/abstract thoughts with cues using compensatory strategies (SLP). Flowsheets (Taken 08/01/2022 1246) LTG: Patient will increase word finding of common (SLP):  Minimal Assistance - Patient > 75%  Supervision Patient will use compensatory strategies to increase word finding of: Abstract thoughts

## 2022-08-01 NOTE — Evaluation (Signed)
Occupational Therapy Assessment and Plan  Patient Details  Name: Billy Kim MRN: [id number] Date of Birth: Aug 20, 1945  OT Diagnosis: cognitive deficits, hemiplegia affecting dominant side, and muscle weakness (generalized) Rehab Potential: Rehab Potential (ACUTE ONLY): Good ELOS: 5-7 days   Today's Date: 08/01/2022 OT Individual Time: 4098-1191 OT Individual Time Calculation (min): 72 min     Hospital Problem: Principal Problem:   Intraparenchymal hemorrhage of brain [facility])   Past Medical History:  Past Medical History:  Diagnosis Date   Coronary artery disease 01/2020   Moderate proximal LAD disease (not hemodynamically significant) and CTO's of D1 and non-dominant RCA   HLD (hyperlipidemia)    Hypertension    Ulcerative colitis 5/81   Remission for years   Past Surgical History:  Past Surgical History:  Procedure Laterality Date   CARDIAC CATHETERIZATION  03/20/01   Cardiolite EF 55% 02/10/02   CHOLECYSTECTOMY N/A 07/09/2017   Procedure: LAPAROSCOPIC CHOLECYSTECTOMY;  Surgeon: Jimmye Norman, MD;  Location: [facility] OR;  Service: General;  Laterality: N/A;   COLONOSCOPY  multiple   ENDOSCOPIC RETROGRADE CHOLANGIOPANCREATOGRAPHY (ERCP) WITH PROPOFOL N/A 07/08/2017   Procedure: ENDOSCOPIC RETROGRADE CHOLANGIOPANCREATOGRAPHY (ERCP) WITH PROPOFOL;  Surgeon: Lynann Bologna, MD;  Location: [facility] ENDOSCOPY;  Service: Endoscopy;  Laterality: N/A;   INGUINAL HERNIA REPAIR  04/09/06   Bilateral   LAPAROSCOPIC APPENDECTOMY  03/1981   LEFT HEART CATH AND CORONARY ANGIOGRAPHY Left 12/11/2019   Procedure: LEFT HEART CATH AND CORONARY ANGIOGRAPHY;  Surgeon: Yvonne Kendall, MD;  Location: [facility] INVASIVE CV LAB;  Service: Cardiovascular;  Laterality: Left;   REMOVAL OF STONES  07/08/2017   Procedure: REMOVAL OF STONES;  Surgeon: Lynann Bologna, MD;  Location: [facility] ENDOSCOPY;  Service: Endoscopy;;   SPHINCTEROTOMY  07/08/2017   Procedure: Dennison Mascot;  Surgeon: Lynann Bologna, MD;  Location: [facility] ENDOSCOPY;   Service: Endoscopy;;    Assessment & Plan Clinical Impression: Billy Kim is a 77 year old right-handed male with history of CAD maintained on low-dose aspirin, hyperlipidemia, hypertension, ulcerative colitis that is in remission.  Per chart review patient lives with spouse.  Two-level home bed and bath main level 3 steps to entry.  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  Presented to [facility] 07/25/2022 with word finding difficulty and gait instability.  Admission chemistries unremarkable except BUN 26 creatinine 1.42, total bilirubin 2.4.  Cranial CT scan showed acute intraparenchymal hemorrhage in the left thalamus measuring 1.8 x 1.4 x 1.2 cm.  No midline shift or mass effect.  CT angiogram of the head showed no aneurysm or vascular malformation.  MRI follow-up no mass or other unexpected findings underlying the patient's left thalamic hematoma.  Initially on Cleviprex for blood pressure control.  Neurosurgery as well as neurology follow-up with conservative care.  Echo with ejection fraction of 60 to 65% no wall motion abnormalities grade 1 diastolic dysfunction.  Tolerating a regular consistency diet.  Neurology recommended DVT prophylaxis with Lovenox however patient would like to only be treated with SCDs for DVT prophylaxis.   Patient transferred to CIR on 07/31/2022 .    Patient currently requires supervision-CGA with basic self-care skills and IADL secondary to muscle weakness, decreased cardiorespiratoy endurance, decreased coordination, decreased attention to right, decreased memory, central origin, and decreased sitting balance, decreased standing balance, decreased postural control, hemiplegia, and decreased balance strategies.  Prior to hospitalization, patient could complete BADLs, IADLs, and work with independent .  Patient will benefit from skilled intervention to decrease level of assist with basic self-care skills, increase  independence with basic self-care  skills, and increase level of independence with iADL prior to discharge home with care partner.  Anticipate patient will require intermittent supervision and follow up outpatient.  OT - End of Session Activity Tolerance: Decreased this session Endurance Deficit: Yes Endurance Deficit Description: intermittent rest breaks OT Assessment Rehab Potential (ACUTE ONLY): Good OT Patient demonstrates impairments in the following area(s): Balance;Endurance;Perception;Behavior;Motor;Safety;Cognition;Pain OT Basic ADL's Functional Problem(s): Bathing;Dressing;Toileting OT Advanced ADL's Functional Problem(s): Light Housekeeping;Simple Meal Preparation OT Transfers Functional Problem(s): Toilet;Tub/Shower OT Additional Impairment(s): Fuctional Use of Upper Extremity OT Plan OT Intensity: Minimum of 1-2 x/day, 45 to 90 minutes OT Frequency: 5 out of 7 days OT Duration/Estimated Length of Stay: 5-7 days OT Treatment/Interventions: Balance/vestibular training;Community reintegration;Disease mangement/prevention;Functional electrical stimulation;Neuromuscular re-education;Patient/family education;Self Care/advanced ADL retraining;Therapeutic Exercise;UE/LE Coordination activities;Cognitive remediation/compensation;Discharge planning;DME/adaptive equipment instruction;Functional mobility training;Pain management;Psychosocial support;Therapeutic Activities;UE/LE Strength taining/ROM;Visual/perceptual remediation/compensation OT Self Feeding Anticipated Outcome(s): Independent OT Basic Self-Care Anticipated Outcome(s): Independent OT Toileting Anticipated Outcome(s): Independent OT Bathroom Transfers Anticipated Outcome(s): Superivsion OT Recommendation Patient destination: Home Follow Up Recommendations: Outpatient OT Equipment Recommended: To be determined   OT Evaluation Precautions/Restrictions  Precautions Precautions: Fall;Other (comment) Precaution Comments: R inattention, mild R  hemi General Chart Reviewed: Yes Additional Pertinent History: CAD Family/Caregiver Present: No Vital Signs Therapy Vitals Temp: 97.9 F (36.6 C) Pulse Rate: 63 Resp: 18 BP: 122/78 Patient Position (if appropriate): Sitting Oxygen Therapy SpO2: 97 % O2 Device: Room Air Pain Pain Assessment Pain Scale: 0-10 Pain Score: 0-No pain Home Living/Prior Functioning Home Living Family/patient expects to be discharged to:: Private residence Living Arrangements: Spouse/significant other Available Help at Discharge: Family, Available 24 hours/day Type of Home: House Home Access: Stairs to enter [employer] of Steps: 3 Entrance Stairs-Rails: Right, Left, Can reach both Home Layout: Two level, Able to live on main level with bedroom/bathroom Bathroom Shower/Tub: Engineer, manufacturing systems: Standard  Lives With: Spouse IADL History Homemaking Responsibilities: Yes Meal Prep Responsibility: Secondary Laundry Responsibility: Secondary Cleaning Responsibility: Secondary Bill Paying/Finance Responsibility: Primary Shopping Responsibility: Secondary Child Care Responsibility: No Current License: Yes Mode of Transportation: Geographical information systems officer Occupation: Full time employment Type of Occupation: heating and air Prior Function Level of Independence: Independent with basic ADLs, Independent with homemaking with ambulation, Independent with gait  Able to Take Stairs?: Yes Driving: Yes Vocation: Full time employment 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 Vision Baseline Vision/History: 1 Wears glasses Ability to See in Adequate Light: 1 Impaired Patient Visual Report: No change from baseline Vision Assessment?: Yes Eye Alignment: Within Functional Limits Ocular Range of Motion: Within Functional Limits Alignment/Gaze Preference: Within Defined Limits Tracking/Visual Pursuits: Able to track stimulus in all quads without difficulty Saccades: Additional  eye shifts occurred during testing (additional eye shifts present during horizontal tracking but not present during vertical) Convergence: Within functional limits Visual Fields: No apparent deficits Depth Perception: Undershoots Additional Comments: mild R innatention Perception  Perception: Impaired Inattention/Neglect: Does not attend to right side of body;Does not attend to right visual field (mild r inatention) Praxis Praxis: Intact Cognition Cognition Overall Cognitive Status: Impaired/Different from baseline Arousal/Alertness: Awake/alert Orientation Level: Person;Place;Situation Person: Oriented Place: Oriented Situation: Oriented Memory: Impaired Memory Impairment: Decreased short term memory;Decreased recall of new information Decreased Short Term Memory: Functional basic;Verbal basic Attention: Focused;Sustained Focused Attention: Appears intact Sustained Attention: Appears intact Awareness: Appears intact Problem Solving: Appears intact Safety/Judgment: Appears intact Brief Interview for Mental Status (BIMS) Repetition of Three Words (First Attempt): 3 Temporal Orientation: Year: Correct Temporal Orientation: Month: Accurate within 5  days Temporal Orientation: Day: Correct Recall: "Sock": No, could not recall Recall: "Blue": Yes, no cue required Recall: "Bed": No, could not recall BIMS Summary Score: 11 Sensation Sensation Light Touch: Appears Intact Hot/Cold: Not tested Proprioception: Appears Intact Stereognosis: Not tested Coordination Gross Motor Movements are Fluid and Coordinated: Yes Fine Motor Movements are Fluid and Coordinated: No Coordination and Movement Description: mild R hemi (UE>LE) Finger Nose Finger Test: mild undershooting with decreased speed of movements on RUE Motor  Motor Motor: Hemiplegia Motor - Skilled Clinical Observations: mild R hemi (UE>LE)  Trunk/Postural Assessment  Cervical Assessment Cervical Assessment: Exceptions to  [facility] (mild forward head) Thoracic Assessment Thoracic Assessment: Exceptions to [facility] (slight rounded shoulders) Lumbar Assessment Lumbar Assessment: Within Functional Limits Postural Control Postural Control: Deficits on evaluation (mild deficit for higher level functional tasks)  Balance Balance Balance Assessed: Yes Static Sitting Balance Static Sitting - Balance Support: Feet supported Static Sitting - Level of Assistance: 6: Modified independent (Device/Increase time) Dynamic Sitting Balance Dynamic Sitting - Balance Support: Feet supported Dynamic Sitting - Level of Assistance: 5: Stand by assistance Static Standing Balance Static Standing - Balance Support: During functional activity Static Standing - Level of Assistance: 5: Stand by assistance Dynamic Standing Balance Dynamic Standing - Balance Support: During functional activity Dynamic Standing - Level of Assistance: 5: Stand by assistance;4: Min assist;Other (comment) (CGA) Extremity/Trunk Assessment RUE Assessment RUE Assessment: Exceptions to [facility] Passive Range of Motion (PROM) Comments: WFL Active Range of Motion (AROM) Comments: WFL General Strength Comments: 4/5, slowed motor movements with decreased coordination LUE Assessment LUE Assessment: Within Functional Limits  Care Tool Care Tool Self Care Eating   Eating Assist Level: Set up assist    Oral Care    Oral Care Assist Level: Supervision/Verbal cueing    Bathing   Body parts bathed by patient: Right arm;Left arm;Chest;Abdomen;Front perineal area;Buttocks;Right upper leg;Left upper leg;Right lower leg;Left lower leg;Face     Assist Level: Contact Guard/Touching assist    Upper Body Dressing(including orthotics)   What is the patient wearing?: Pull over shirt   Assist Level: Supervision/Verbal cueing    Lower Body Dressing (excluding footwear)   What is the patient wearing?: Pants;Underwear/pull up Assist for lower body dressing: Contact  Guard/Touching assist    Putting on/Taking off footwear   What is the patient wearing?: Socks;Shoes Assist for footwear: Supervision/Verbal cueing       Care Tool Toileting Toileting activity   Assist for toileting: Contact Guard/Touching assist     Care Tool Bed Mobility Roll left and right activity   Roll left and right assist level: Independent with assistive device    Sit to lying activity   Sit to lying assist level: Independent with assistive device    Lying to sitting on side of bed activity   Lying to sitting on side of bed assist level: the ability to move from lying on the back to sitting on the side of the bed with no back support.: Independent with assistive device     Care Tool Transfers Sit to stand transfer   Sit to stand assist level: Contact Guard/Touching assist    Chair/bed transfer   Chair/bed transfer assist level: Contact Guard/Touching assist     Toilet transfer   Assist Level: Contact Guard/Touching assist     Care Tool Cognition  Expression of Ideas and Wants Expression of Ideas and Wants: 3. Some difficulty - exhibits some difficulty with expressing needs and ideas (e.g, some words or finishing thoughts) or speech  is not clear  Understanding Verbal and Non-Verbal Content Understanding Verbal and Non-Verbal Content: 4. Understands (complex and basic) - clear comprehension without cues or repetitions   Memory/Recall Ability Memory/Recall Ability : Current season;That he or she is in a hospital/hospital unit;Staff names and faces   Refer to Care Plan for Long Term Goals  SHORT TERM GOAL WEEK 1 OT Short Term Goal 1 (Week 1): STG=LTG d/t ELOS  Recommendations for other services: Therapeutic Recreation  Pet therapy and Outing/community reintegration   Skilled Therapeutic Intervention Skilled Therapeutic Interventions/Progress Updates: 1:1 OT evaluation and intervention initiated with skilled education provided on OT role, goals, and POC. Pt  received sitting up in recliner presenting to bein good spirits and receptive to skilled OT session. Pt completed BADLs this session at levels listed below with mild strength and coordination deficits noted in RUE and mild R inattention to R UE during functional mobility. Pt currently requires CGA for BADLs and functional mobility 2/2 balance, strength, coordination, and awareness deficits and would benefit from continued OT services in IPR setting. Pt completed 9-hole peg test and grip strength test seated at table to track progress during IPR stay.   Grip strength:  R hand:  Trial 1: 94 lbs Trail 2: 100 lbs Trail 3: 102 lbs Average: 98.87 lbs L hand:  Trial 1: 110 lbs Trail 2: 105 lbs Trail 3: 110 lbs Average: 108 lbs  9-hole peg:  R hand:  Trial 1: 34.46 sec Trail 2: 29.83 sec Trail 3: 30.98 sec Average: 31.76 sec L hand:  Trial 1: 25.09 sec Trail 2: 24.87 sec Trail 3: 25.19 sec Average: 25.05 Pt presenting with mild FM coordination and strength deficits on R (dominant) hand impacting independence in BADL, IADL, and work tasks. Pt was left resting in bed at end of session with call bell in reach, bed alarm on, and all needs met. ADL ADL Eating: Set up Where Assessed-Eating: Chair Grooming: Contact guard Where Assessed-Grooming: Standing at sink Upper Body Bathing: Supervision/safety Where Assessed-Upper Body Bathing: Shower Lower Body Bathing: Contact guard Where Assessed-Lower Body Bathing: Shower Upper Body Dressing: Supervision/safety Where Assessed-Upper Body Dressing: Edge of bed Lower Body Dressing: Contact guard Toileting: Contact guard Where Assessed-Toileting: Teacher, adult education: Furniture conservator/restorer Method: Proofreader: Engineer, technical sales: Not assessed Film/video editor: Administrator, arts Method: Designer, industrial/product: Event organiser  Bed Mobility Bed  Mobility: Supine to Sit;Sit to Supine Supine to Sit: Independent with assistive device Sit to Supine: Independent with assistive device Transfers Sit to Stand: Contact Guard/Touching assist;Supervision/Verbal cueing Stand to Sit: Supervision/Verbal cueing;Contact Guard/Touching assist   Discharge Criteria: Patient will be discharged from OT if patient refuses treatment 3 consecutive times without medical reason, if treatment goals not met, if there is a change in medical status, if patient makes no progress towards goals or if patient is discharged from hospital.  The above assessment, treatment plan, treatment alternatives and goals were discussed and mutually agreed upon: by patient and by family  Army Fossa 08/01/2022, 4:31 PM

## 2022-08-01 NOTE — Progress Notes (Signed)
Inpatient Rehabilitation Care Coordinator Assessment and Plan Patient Details  Name: Billy Kim MRN: [id number] Date of Birth: 08/13/1945  Today's Date: 08/01/2022  Hospital Problems: Principal Problem:   Intraparenchymal hemorrhage of brain [facility])  Past Medical History:  Past Medical History:  Diagnosis Date   Coronary artery disease 01/2020   Moderate proximal LAD disease (not hemodynamically significant) and CTO's of D1 and non-dominant RCA   HLD (hyperlipidemia)    Hypertension    Ulcerative colitis 5/81   Remission for years   Past Surgical History:  Past Surgical History:  Procedure Laterality Date   CARDIAC CATHETERIZATION  03/20/01   Cardiolite EF 55% 02/10/02   CHOLECYSTECTOMY N/A 07/09/2017   Procedure: LAPAROSCOPIC CHOLECYSTECTOMY;  Surgeon: Jimmye Norman, MD;  Location: [facility] OR;  Service: General;  Laterality: N/A;   COLONOSCOPY  multiple   ENDOSCOPIC RETROGRADE CHOLANGIOPANCREATOGRAPHY (ERCP) WITH PROPOFOL N/A 07/08/2017   Procedure: ENDOSCOPIC RETROGRADE CHOLANGIOPANCREATOGRAPHY (ERCP) WITH PROPOFOL;  Surgeon: Lynann Bologna, MD;  Location: [facility] ENDOSCOPY;  Service: Endoscopy;  Laterality: N/A;   INGUINAL HERNIA REPAIR  04/09/06   Bilateral   LAPAROSCOPIC APPENDECTOMY  03/1981   LEFT HEART CATH AND CORONARY ANGIOGRAPHY Left 12/11/2019   Procedure: LEFT HEART CATH AND CORONARY ANGIOGRAPHY;  Surgeon: Yvonne Kendall, MD;  Location: [facility] INVASIVE CV LAB;  Service: Cardiovascular;  Laterality: Left;   REMOVAL OF STONES  07/08/2017   Procedure: REMOVAL OF STONES;  Surgeon: Lynann Bologna, MD;  Location: [facility] ENDOSCOPY;  Service: Endoscopy;;   SPHINCTEROTOMY  07/08/2017   Procedure: Dennison Mascot;  Surgeon: Lynann Bologna, MD;  Location: [facility] ENDOSCOPY;  Service: Endoscopy;;   Social History:  reports that he has never smoked. He has never used smokeless tobacco. He reports that he does not drink alcohol and does not use drugs.  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 Education: HS Health Literacy - How often do you need to have someone help you when you read instructions, pamphlets, or other written material from your doctor or pharmacy?: Rarely Writes: Yes Employment Status: Employed 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 Return to Work Plans: yes, with assistance from son Legal History/Current Legal Issues: n/a Guardian/Conservator: n/a   Abuse/Neglect Abuse/Neglect Assessment Can Be Completed: Yes Physical Abuse: Denies Verbal Abuse: Denies Sexual Abuse: Denies Exploitation of patient/patient's resources: Denies Self-Neglect: Denies  Patient response to: Social Isolation - How often do you feel lonely or isolated from those around you?: Never  Emotional Status Pt's affect, behavior and adjustment status: Pleasant Recent Psychosocial Issues: Coping Psychiatric History: n/a Substance Abuse History: n/a  Patient / Family Perceptions, Expectations & Goals Pt/Family understanding of illness & functional limitations: yes Premorbid pt/family roles/activities: Independent Anticipated changes in roles/activities/participation: Plans to have some assistance from spouse and son 24/7 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 Agencies: None Premorbid Home Care/DME Agencies: None Transportation available at discharge: Son able to transport Is the patient able to respond to transportation needs?: Yes In the past 12 months, has lack of transportation kept you from medical appointments or from getting medications?: No In the past 12 months, has lack of  transportation kept you from meetings, work, or from getting things needed for daily living?: No Resource referrals recommended: Neuropsychology  Discharge Planning Living Arrangements:  Spouse/significant other Support Systems: Spouse/significant other, Children Type of Residence: Private residence Insurance Resources: Media planner (specify) (HTA) Financial Resources: Employment Financial Screen Referred: No Living Expenses: Own Money Management: Patient, Spouse Does the patient have any problems obtaining your medications?: No Home Management: Independent Patient/Family Preliminary Plans: Plans to continue to assist with assistance from spouse Care Coordinator Barriers to Discharge: Insurance for SNF coverage, Decreased caregiver support, Lack of/limited family support Care Coordinator Anticipated Follow Up Needs: HH/OP Expected length of stay: 5--7 Days  Clinical Impression Sw met with patient introduced self and explained role. Patient discharging home with spouse. Patient evaluations currently pending and team will determine a d/c date later. No additional questions or concerns.   Andria Rhein 08/01/2022, 2:33 PM

## 2022-08-01 NOTE — Discharge Instructions (Addendum)
Inpatient Rehab Discharge Instructions  Billy Kim Angeletti Discharge date and time: No discharge date for patient encounter.   Activities/Precautions/ Functional Status: Activity: activity as tolerated Diet: regular diet Wound Care: Routine skin checks Functional status:  ___ No restrictions     ___ Walk up steps independently ___ 24/7 supervision/assistance   ___ Walk up steps with assistance ___ Intermittent supervision/assistance  ___ Bathe/dress independently ___ Walk with walker     _x__ Bathe/dress with assistance ___ Walk Independently    ___ Shower independently ___ Walk with assistance    ___ Shower with assistance ___ No alcohol     ___ Return to work/school ________  Special Instructions: No driving smoking or alcohol  Continue to hold aspirin until further notice    COMMUNITY REFERRALS UPON DISCHARGE:     Outpatient: PT     OT    ST                 Agency: [facility]  Phone: [phone]             Appointment Date/Time: *Please expect follow-up within 7-10 business days to schedule your appointment. If you have not received follow-up, be sure to contact the site directly.*    My questions have been answered and I understand these instructions. I will adhere to these goals and the provided educational materials after my discharge from the hospital.  Patient/Caregiver Signature _______________________________ Date __________  Clinician Signature _______________________________________ Date __________  Please bring this form and your medication list with you to all your follow-up doctor's appointments.

## 2022-08-01 NOTE — Evaluation (Signed)
Speech Language Pathology Assessment and Plan  Patient Details  Name: Billy Kim MRN: [id number] Date of Birth: [birth year]  SLP Diagnosis: Aphasia  Rehab Potential: Good ELOS: 5- 7 days    Today's Date: 08/01/2022 SLP Individual Time: 4098-1191 SLP Individual Time Calculation (min): 35 min   Hospital Problem: Principal Problem:   Intraparenchymal hemorrhage of brain [facility])  Past Medical History:  Past Medical History:  Diagnosis Date   Coronary artery disease 01/2020   Moderate proximal LAD disease (not hemodynamically significant) and CTO's of D1 and non-dominant RCA   HLD (hyperlipidemia)    Hypertension    Ulcerative colitis 5/81   Remission for years   Past Surgical History:  Past Surgical History:  Procedure Laterality Date   CARDIAC CATHETERIZATION  03/20/01   Cardiolite EF 55% 02/10/02   CHOLECYSTECTOMY N/A 07/09/2017   Procedure: LAPAROSCOPIC CHOLECYSTECTOMY;  Surgeon: Jimmye Norman, MD;  Location: [facility] OR;  Service: General;  Laterality: N/A;   COLONOSCOPY  multiple   ENDOSCOPIC RETROGRADE CHOLANGIOPANCREATOGRAPHY (ERCP) WITH PROPOFOL N/A 07/08/2017   Procedure: ENDOSCOPIC RETROGRADE CHOLANGIOPANCREATOGRAPHY (ERCP) WITH PROPOFOL;  Surgeon: Lynann Bologna, MD;  Location: [facility] ENDOSCOPY;  Service: Endoscopy;  Laterality: N/A;   INGUINAL HERNIA REPAIR  04/09/06   Bilateral   LAPAROSCOPIC APPENDECTOMY  03/1981   LEFT HEART CATH AND CORONARY ANGIOGRAPHY Left 12/11/2019   Procedure: LEFT HEART CATH AND CORONARY ANGIOGRAPHY;  Surgeon: Yvonne Kendall, MD;  Location: [facility] INVASIVE CV LAB;  Service: Cardiovascular;  Laterality: Left;   REMOVAL OF STONES  07/08/2017   Procedure: REMOVAL OF STONES;  Surgeon: Lynann Bologna, MD;  Location: [facility] ENDOSCOPY;  Service: Endoscopy;;   SPHINCTEROTOMY  07/08/2017   Procedure: Dennison Mascot;  Surgeon: Lynann Bologna, MD;  Location: [facility] ENDOSCOPY;  Service: Endoscopy;;    Assessment / Plan / Recommendation Clinical Impression  Billy Kim is  a 77 year old right-handed male with history of CAD maintained on low-dose aspirin, hyperlipidemia, hypertension, ulcerative colitis that is in remission.  Per chart review patient lives with spouse.  Two-level home bed and bath main level 3 steps to entry.  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  Presented to [facility]22 with word finding difficulty and gait instability.   Cranial CT scan showed acute intraparenchymal hemorrhage in the left thalamus measuring 1.8 x 1.4 x 1.2 cm.  No midline shift or mass effect.  CT angiogram of the head showed no aneurysm or vascular malformation.  MRI follow-up no mass or other unexpected findings underlying the patient's left thalamic hematoma.  Initially on Cleviprex for blood pressure control.  Neurosurgery as well as neurology follow-up with conservative care. Admitted to CIR on 07/31/22.   Pt presents with a mild anomia. Western Aphasia Battery (WAB) bedside form completed and revealed anomic aphasia. Strengths observed in auditory verbal comprehension, sequential commands, repetition and naming. He acted as his own historian and narrated recent medical history. Pt reports, "I do okay with things I say everyday but I have a hard time with things I don't say a lot." Pt's fluency was described as some hesitations and word finding difficulty. Informally, pt read lunch menu and determine preferred meal. Pt also able to read therapy schedule and identify upcoming appointments. He wrote his name, address, and a short sentence. Pt presents with some decreased legibility which improved with additional trials.   Cognitively, pt was alert and oriented x4. He demonstrated understanding of call button and how to use it. Swallowing screen completed with thin liquids. Pt reports  upper and lower dentures which he wears all the time. He reports that lower dentures are ill fitting and require use of a lot of adhesive to fit properly. At baseline, pt  observed with frequent throat clearing. He consumed thin liquids via straw with delayed throat clear observed after one trial however likely 2/2 consuming remaining liquid from cup. No other s/sx pen/ asp during additional trials.   Pt would benefit from skilled SLP services to maximize  expressive aphasia in order to maximize his independence prior to discharge. Anticipate pt will require supervision at home and f/u home health SLP services.    Skilled Therapeutic Interventions          WAB, informal assessment measures administered. Please see full report for additional details.     SLP Assessment  Patient will need skilled Speech Lanaguage Pathology Services during CIR admission    Recommendations  SLP Diet Recommendations: Age appropriate regular solids;Thin Liquid Administration via: Cup;Straw Medication Administration: Whole meds with liquid Supervision: Patient able to self feed Compensations: Slow rate;Small sips/bites Postural Changes and/or Swallow Maneuvers: Seated upright 90 degrees;Upright 30-60 min after meal Oral Care Recommendations: Oral care BID Patient destination: Home Follow up Recommendations: 24 hour supervision/assistance;Outpatient SLP Equipment Recommended: None recommended by SLP    SLP Frequency 1 to 3 out of 7 days   SLP Duration  SLP Intensity  SLP Treatment/Interventions 5- 7 days  Minumum of 1-2 x/day, 30 to 90 minutes  Cueing hierarchy;Internal/external aids;Speech/Language facilitation;Patient/family education;Multimodal communication approach;Functional tasks;Therapeutic Activities    Pain Pain Assessment Pain Scale: 0-10 Pain Score: 0-No pain  Prior Functioning Cognitive/Linguistic Baseline: Within functional limits Type of Home: House  Lives With: Spouse Available Help at Discharge: Family;Available 24 hours/day Vocation: Full time employment  SLP Evaluation Cognition Overall Cognitive Status: Impaired/Different from  baseline Arousal/Alertness: Awake/alert Orientation Level: Oriented X4 Year: 2024 Month: June Day of Week: Correct Attention: Focused;Sustained Focused Attention: Appears intact Sustained Attention: Appears intact Memory: Impaired Awareness: Appears intact Safety/Judgment: Appears intact  Comprehension Auditory Comprehension Overall Auditory Comprehension: Appears within functional limits for tasks assessed Yes/No Questions: Impaired Complex Questions: 75-100% accurate Commands: Within Functional Limits Two Step Basic Commands: 75-100% accurate Multistep Basic Commands: 75-100% accurate Conversation: Complex Visual Recognition/Discrimination Discrimination: Not tested Reading Comprehension Reading Status: Within funtional limits Expression Expression Primary Mode of Expression: Verbal Verbal Expression Overall Verbal Expression: Impaired Automatic Speech: Name;Social Response;Counting;Day of week Level of Generative/Spontaneous Verbalization: Sentence;Conversation Repetition: No impairment Naming: Impairment Confrontation: Within functional limits Pragmatics: No impairment Impairments:  (circumlocution) Non-Verbal Means of Communication: Not applicable Written Expression Dominant Hand: Right Written Expression: Within Functional Limits Oral Motor Oral Motor/Sensory Function Overall Oral Motor/Sensory Function: Mild impairment Facial ROM: Within Functional Limits Lingual ROM: Reduced right;Suspected CN XII (hypoglossal) dysfunction Lingual Strength: Reduced;Suspected CN XII (hypoglossal) dysfunction Motor Speech Overall Motor Speech: Appears within functional limits for tasks assessed Respiration: Within functional limits Phonation: Normal Resonance: Within functional limits Articulation: Within functional limitis Intelligibility: Intelligible Motor Planning: Witnin functional limits  Care Tool Care Tool Cognition Ability to hear (with hearing aid or hearing  appliances if normally used Ability to hear (with hearing aid or hearing appliances if normally used): 0. Adequate - no difficulty in normal conservation, social interaction, listening to TV   Expression of Ideas and Wants Expression of Ideas and Wants: 3. Some difficulty - exhibits some difficulty with expressing needs and ideas (e.g, some words or finishing thoughts) or speech is not clear   Understanding Verbal and Non-Verbal Content Understanding Verbal  and Non-Verbal Content: 4. Understands (complex and basic) - clear comprehension without cues or repetitions  Memory/Recall Ability Memory/Recall Ability : Current season;That he or she is in a hospital/hospital unit   Bedside Swallowing Assessment General Date of Onset: 07/25/22 Previous Swallow Assessment: hx of upper endoscopy most recent 03/25/20 - noted stenosis of GI junction and hiatal hernia Diet Prior to this Study: Regular;Thin liquids (Level 0) History of Recent Intubation: No Behavior/Cognition: Alert;Cooperative Oral Cavity - Dentition: Dentures, top;Dentures, bottom Self-Feeding Abilities: Able to feed self Vision: Functional for self-feeding Patient Positioning: Upright in bed Volitional Cough: Strong Volitional Swallow: Able to elicit  Oral Care Assessment Oral Assessment  (WDL): Exceptions to WDL Lips: Symmetrical Teeth: Dentures upper;Dentures lower Tongue: Pink;Moist Mucous Membrane(s): Moist;Pink Saliva: Moist, saliva free flowing Level of Consciousness: Alert Is patient on any of following O2 devices?: None of the above Nutritional status: No high risk factors Oral Assessment Risk : Low Risk Ice Chips Ice chips: Not tested Thin Liquid Thin Liquid: Within functional limits Presentation: Self Fed;Cup Other Comments: throat clearing through out session however did not appear to be related to s/sx pen/asp Nectar Thick Nectar Thick Liquid: Not tested Honey Thick Honey Thick Liquid: Not tested Puree Puree:  Not tested Solid Solid: Not tested BSE Assessment Risk for Aspiration Impact on safety and function: No limitations Other Related Risk Factors: History of dysphagia;History of esophageal-related issues  Short Term Goals: Week 1: SLP Short Term Goal 1 (Week 1): STGs= LTGs due to ELOS  Refer to Care Plan for Long Term Goals  Recommendations for other services: None   Discharge Criteria: Patient will be discharged from SLP if patient refuses treatment 3 consecutive times without medical reason, if treatment goals not met, if there is a change in medical status, if patient makes no progress towards goals or if patient is discharged from hospital.  The above assessment, treatment plan, treatment alternatives and goals were discussed and mutually agreed upon: by patient  Renaee Munda 08/01/2022, 12:18 PM

## 2022-08-01 NOTE — Plan of Care (Signed)
Problem: RH Balance Goal: LTG Patient will maintain dynamic standing with ADLs (OT) Description: LTG:  Patient will maintain dynamic standing balance with assist during activities of daily living (OT)  Flowsheets (Taken 08/01/2022 1646) LTG: Pt will maintain dynamic standing balance during ADLs with: Independent with assistive device   Problem: Sit to Stand Goal: LTG:  Patient will perform sit to stand in prep for activites of daily living with assistance level (OT) Description: LTG:  Patient will perform sit to stand in prep for activites of daily living with assistance level (OT) Flowsheets (Taken 08/01/2022 1646) LTG: PT will perform sit to stand in prep for activites of daily living with assistance level: Independent with assistive device   Problem: RH Dressing Goal: LTG Patient will perform upper body dressing (OT) Description: LTG Patient will perform upper body dressing with assist, with/without cues (OT). Flowsheets (Taken 08/01/2022 1646) LTG: Pt will perform upper body dressing with assistance level of: Independent with assistive device Goal: LTG Patient will perform lower body dressing w/assist (OT) Description: LTG: Patient will perform lower body dressing with assist, with/without cues in positioning using equipment (OT) Flowsheets (Taken 08/01/2022 1646) LTG: Pt will perform lower body dressing with assistance level of: Independent with assistive device   Problem: RH Toileting Goal: LTG Patient will perform toileting task (3/3 steps) with assistance level (OT) Description: LTG: Patient will perform toileting task (3/3 steps) with assistance level (OT)  Flowsheets (Taken 08/01/2022 1646) LTG: Pt will perform toileting task (3/3 steps) with assistance level: Independent with assistive device   Problem: RH Functional Use of Upper Extremity Goal: LTG Patient will use RT/LT upper extremity as a (OT) Description: LTG: Patient will use right/left upper extremity as a  stabilizer/gross assist/diminished/nondominant/dominant level with assist, with/without cues during functional activity (OT) Flowsheets (Taken 08/01/2022 1646) LTG: Use of upper extremity in functional activities: RUE as dominant level LTG: Pt will use upper extremity in functional activity with assistance level of: Supervision/Verbal cueing   Problem: RH Simple Meal Prep Goal: LTG Patient will perform simple meal prep w/assist (OT) Description: LTG: Patient will perform simple meal prep with assistance, with/without cues (OT). Flowsheets (Taken 08/01/2022 1646) LTG: Pt will perform simple meal prep with assistance level of: Supervision/Verbal cueing   Problem: RH Light Housekeeping Goal: LTG Patient will perform light housekeeping w/assist (OT) Description: LTG: Patient will perform light housekeeping with assistance, with/without cues (OT). Flowsheets (Taken 08/01/2022 1646) LTG: Pt will perform light housekeeping with assistance level of: Supervision/Verbal cueing   Problem: RH Toilet Transfers Goal: LTG Patient will perform toilet transfers w/assist (OT) Description: LTG: Patient will perform toilet transfers with assist, with/without cues using equipment (OT) Flowsheets (Taken 08/01/2022 1646) LTG: Pt will perform toilet transfers with assistance level of: Independent with assistive device   Problem: RH Tub/Shower Transfers Goal: LTG Patient will perform tub/shower transfers w/assist (OT) Description: LTG: Patient will perform tub/shower transfers with assist, with/without cues using equipment (OT) Flowsheets (Taken 08/01/2022 1646) LTG: Pt will perform tub/shower stall transfers with assistance level of: Independent with assistive device LTG: Pt will perform tub/shower transfers from: Tub/shower combination   Problem: RH Memory Goal: LTG Patient will demonstrate ability for day to day recall/carry over during activities of daily living with assistance level (OT) Description: LTG:   Patient will demonstrate ability for day to day recall/carry over during activities of daily living with assistance level (OT). Flowsheets (Taken 08/01/2022 1646) LTG:  Patient will demonstrate ability for day to day recall/carry over during activities of daily living  with assistance level (OT): Supervision

## 2022-08-01 NOTE — Evaluation (Signed)
Physical Therapy Assessment and Plan  Patient Details  Name: Billy Kim MRN: [id number] Date of Birth: 12-Sep-1945  PT Diagnosis: Abnormal posture, Abnormality of gait, Cognitive deficits, Difficulty walking, Hemiparesis dominant, and Muscle weakness Rehab Potential: Excellent ELOS: ~5-7 days   Today's Date: 08/01/2022 PT Individual Time: 1103-1202 PT Individual Time Calculation (min): 59 min    Hospital Problem: Principal Problem:   Intraparenchymal hemorrhage of brain [facility])   Past Medical History:  Past Medical History:  Diagnosis Date   Coronary artery disease 01/2020   Moderate proximal LAD disease (not hemodynamically significant) and CTO's of D1 and non-dominant RCA   HLD (hyperlipidemia)    Hypertension    Ulcerative colitis 5/81   Remission for years   Past Surgical History:  Past Surgical History:  Procedure Laterality Date   CARDIAC CATHETERIZATION  03/20/01   Cardiolite EF 55% 02/10/02   CHOLECYSTECTOMY N/A 07/09/2017   Procedure: LAPAROSCOPIC CHOLECYSTECTOMY;  Surgeon: Jimmye Norman, MD;  Location: [facility] OR;  Service: General;  Laterality: N/A;   COLONOSCOPY  multiple   ENDOSCOPIC RETROGRADE CHOLANGIOPANCREATOGRAPHY (ERCP) WITH PROPOFOL N/A 07/08/2017   Procedure: ENDOSCOPIC RETROGRADE CHOLANGIOPANCREATOGRAPHY (ERCP) WITH PROPOFOL;  Surgeon: Lynann Bologna, MD;  Location: [facility] ENDOSCOPY;  Service: Endoscopy;  Laterality: N/A;   INGUINAL HERNIA REPAIR  04/09/06   Bilateral   LAPAROSCOPIC APPENDECTOMY  03/1981   LEFT HEART CATH AND CORONARY ANGIOGRAPHY Left 12/11/2019   Procedure: LEFT HEART CATH AND CORONARY ANGIOGRAPHY;  Surgeon: Yvonne Kendall, MD;  Location: [facility] INVASIVE CV LAB;  Service: Cardiovascular;  Laterality: Left;   REMOVAL OF STONES  07/08/2017   Procedure: REMOVAL OF STONES;  Surgeon: Lynann Bologna, MD;  Location: [facility] ENDOSCOPY;  Service: Endoscopy;;   SPHINCTEROTOMY  07/08/2017   Procedure: Dennison Mascot;  Surgeon: Lynann Bologna, MD;  Location: [facility]  ENDOSCOPY;  Service: Endoscopy;;    Assessment & Plan Clinical Impression: Patient is a 77 y.o. year old right-handed male with history of CAD maintained on low-dose aspirin, hyperlipidemia, hypertension, ulcerative colitis that is in remission.  Per chart review patient lives with spouse.  Two-level home bed and bath main level 3 steps to entry.  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  Presented to [facility]/25/2022 with word finding difficulty and gait instability.  Admission chemistries unremarkable except BUN 26 creatinine 1.42, total bilirubin 2.4.  Cranial CT scan showed acute intraparenchymal hemorrhage in the left thalamus measuring 1.8 x 1.4 x 1.2 cm.  No midline shift or mass effect.  CT angiogram of the head showed no aneurysm or vascular malformation.  MRI follow-up no mass or other unexpected findings underlying the patient's left thalamic hematoma.  Initially on Cleviprex for blood pressure control.  Neurosurgery as well as neurology follow-up with conservative care.  Echo with ejection fraction of 60 to 65% no wall motion abnormalities grade 1 diastolic dysfunction.  Tolerating a regular consistency diet.  Neurology recommended DVT prophylaxis with Lovenox however patient would like to only be treated with SCDs for DVT prophylaxis.  Therapy evaluations completed due to patient decreased functional mobility was admitted for a comprehensive rehab program. Patient transferred to CIR on 07/31/2022 .   Patient currently requires  CGA  with mobility secondary to muscle weakness, decreased cardiorespiratoy endurance, impaired timing and sequencing and unbalanced muscle activation, decreased attention to right, decreased memory, and decreased standing balance, decreased postural control, and decreased balance strategies.  Prior to hospitalization, patient was independent  with mobility and lived with Spouse in a House home.  Home access is 3Stairs to enter.  Patient will  benefit from skilled PT intervention to maximize safe functional mobility, minimize fall risk, and decrease caregiver burden for planned discharge home with 24 hour supervision.  Anticipate patient will benefit from follow up OP at discharge.  PT - End of Session Activity Tolerance: Tolerates 30+ min activity with multiple rests Endurance Deficit: Yes Endurance Deficit Description: intermittent rest breaks PT Assessment Rehab Potential (ACUTE/IP ONLY): Excellent PT Patient demonstrates impairments in the following area(s): Balance;Safety;Perception;Skin Integrity;Endurance;Motor;Nutrition;Pain PT Transfers Functional Problem(s): Bed Mobility;Car;Bed to Chair;Floor PT Locomotion Functional Problem(s): Ambulation;Stairs PT Plan PT Intensity: Minimum of 1-2 x/day ,45 to 90 minutes PT Frequency: 5 out of 7 days PT Duration Estimated Length of Stay: ~5-7 days PT Treatment/Interventions: Ambulation/gait training;Community reintegration;DME/adaptive equipment instruction;Neuromuscular re-education;Psychosocial support;Stair training;UE/LE Strength taining/ROM;Balance/vestibular training;Discharge planning;Functional electrical stimulation;Pain management;Skin care/wound management;Therapeutic Activities;UE/LE Coordination activities;Cognitive remediation/compensation;Functional mobility training;Disease management/prevention;Patient/family education;Splinting/orthotics;Therapeutic Exercise;Visual/perceptual remediation/compensation PT Transfers Anticipated Outcome(s): mod-I using LRAD PT Locomotion Anticipated Outcome(s): mod-I household levels PT Recommendation Recommendations for Other Services: Therapeutic Recreation consult Therapeutic Recreation Interventions: Outing/community reintergration;Kitchen group Follow Up Recommendations: Outpatient PT;24 hour supervision/assistance Patient destination: Home Equipment Recommended: None recommended by PT   PT  Evaluation Precautions/Restrictions Precautions Precautions: Fall;Other (comment) Precaution Comments: R inattention, mild R hemi Restrictions Weight Bearing Restrictions: No Pain Pain Assessment Pain Scale: 0-10 Pain Score: 0-No pain Pain Interference Pain Interference Pain Effect on Sleep: 0. Does not apply - I have not had any pain or hurting in the past 5 days Pain Interference with Therapy Activities: 1. Rarely or not at all Pain Interference with Day-to-Day Activities: 1. 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 Vision/Perception  Vision - History Ability to See in Adequate Light: 1 Impaired Vision - Assessment Eye Alignment: Within Functional Limits Ocular Range of Motion: Within Functional Limits Tracking/Visual Pursuits: Able to track stimulus in all quads without difficulty Saccades: Additional eye shifts occurred during testing (additional eye shifts during vertical saccades but WNL for horizontal) Convergence: Within functional limits Perception Perception: Impaired Inattention/Neglect: Other (comment) (possible mild R inattention, will continue to assess) Praxis Praxis: Intact  Cognition Overall Cognitive Status: Impaired/Different from baseline Arousal/Alertness: Awake/alert Orientation Level: Oriented X4 Year: 2024 Month: June Day of Week: Correct Attention: Focused;Sustained Focused Attention: Appears  intact Sustained Attention: Appears intact Memory: Impaired Awareness: Appears intact Safety/Judgment: Appears intact Sensation Sensation Light Touch: Appears Intact Hot/Cold: Not tested Proprioception: Appears Intact Stereognosis: Not tested Coordination Gross Motor Movements are Fluid and Coordinated: Yes (for LEs gross motor functional tasks) Coordination and Movement Description: mild R hemi (UE>LE) Motor  Motor Motor: Hemiplegia Motor - Skilled Clinical Observations: mild R hemi (UE>LE)   Trunk/Postural Assessment  Cervical Assessment Cervical Assessment: Exceptions to [facility] (mild forward head) Thoracic Assessment Thoracic Assessment: Exceptions to WFL (slight rounded shoulders) Lumbar Assessment Lumbar Assessment: Within Functional Limits Postural Control Postural Control: Deficits on evaluation (min deficits for higher level dynamic balance tasks)  Balance Balance Balance Assessed: Yes Standardized Balance Assessment Standardized Balance Assessment: Functional Gait Assessment Static Sitting Balance Static Sitting - Balance Support: Feet supported  Static Sitting - Level of Assistance: 6: Modified independent (Device/Increase time) Dynamic Sitting Balance Dynamic Sitting - Balance Support: Feet supported Dynamic Sitting - Level of Assistance: 5: Stand by assistance Static Standing Balance Static Standing - Balance Support: During functional activity Static Standing - Level of Assistance: 5: Stand by assistance Dynamic Standing Balance Dynamic Standing - Balance Support: During functional activity Dynamic Standing - Level of Assistance: Other (comment) (CGA) Functional Gait  Assessment Gait assessed : Yes Gait Level Surface: Walks 20 ft, slow speed, abnormal gait pattern, evidence for imbalance or deviates 10-15 in outside of the 12 in walkway width. Requires more than 7 sec to ambulate 20 ft. (took more than 7 seconds (took 10 then 9seconds)) Change in Gait Speed:  Able to smoothly change walking speed without loss of balance or gait deviation. Deviate no more than 6 in outside of the 12 in walkway width. Gait with Horizontal Head Turns: Performs head turns smoothly with slight change in gait velocity (eg, minor disruption to smooth gait path), deviates 6-10 in outside 12 in walkway width, or uses an assistive device. Gait with Vertical Head Turns: Performs task with slight change in gait velocity (eg, minor disruption to smooth gait path), deviates 6 - 10 in outside 12 in walkway width or uses assistive device Gait and Pivot Turn: Pivot turns safely within 3 sec and stops quickly with no loss of balance. Step Over Obstacle: Is able to step over one shoe box (4.5 in total height) without changing gait speed. No evidence of imbalance. Gait with Narrow Base of Support: Is able to ambulate for 10 steps heel to toe with no staggering. Gait with Eyes Closed: Walks 20 ft, slow speed, abnormal gait pattern, evidence for imbalance, deviates 10-15 in outside 12 in walkway width. Requires more than 9 sec to ambulate 20 ft. (requires more than 9 seconds) Ambulating Backwards: Walks 20 ft, uses assistive device, slower speed, mild gait deviations, deviates 6-10 in outside 12 in walkway width. Steps: Alternating feet, must use rail. Total Score: 21 Extremity Assessment  RLE Assessment RLE Assessment: Exceptions to [facility] Active Range of Motion (AROM) Comments: WFL/WNL General Strength Comments: assessed in sitting RLE Strength Right Hip Flexion: 4+/5 Right Knee Flexion: 5/5 Right Knee Extension: 5/5 Right Ankle Dorsiflexion: 4+/5 Right Ankle Plantar Flexion: 4+/5 LLE Assessment LLE Assessment: Within Functional Limits Active Range of Motion (AROM) Comments: WFL/WNL General Strength Comments: assessed in sitting to be 5/5  Care Tool Care Tool Bed Mobility Roll left and right activity   Roll left and right assist level: Independent with assistive device    Sit to  lying activity   Sit to lying assist level: Independent with assistive device    Lying to sitting on side of bed activity   Lying to sitting on side of bed assist level: the ability to move from lying on the back to sitting on the side of the bed with no back support.: Independent with assistive device     Care Tool Transfers Sit to stand transfer   Sit to stand assist level: Contact Guard/Touching assist    Chair/bed transfer   Chair/bed transfer assist level: Contact Guard/Touching assist     Toilet transfer        Car transfer   Car transfer assist level: Contact Guard/Touching assist      Care Tool Locomotion Ambulation   Assist level: Contact Guard/Touching assist Assistive device: No Device Max distance: 250ft  Walk 10 feet activity   Assist level:  Contact Guard/Touching assist Assistive device: No Device   Walk 50 feet with 2 turns activity   Assist level: Contact Guard/Touching assist Assistive device: No Device  Walk 150 feet activity   Assist level: Contact Guard/Touching assist Assistive device: No Device  Walk 10 feet on uneven surfaces activity   Assist level: Contact Guard/Touching assist Assistive device: Other (comment) (none)  Stairs   Assist level: Contact Guard/Touching assist Stairs assistive device: 2 hand rails Max number of stairs: 12  Walk up/down 1 step activity   Walk up/down 1 step (curb) assist level: Contact Guard/Touching assist Walk up/down 1 step or curb assistive device: 2 hand rails  Walk up/down 4 steps activity   Walk up/down 4 steps assist level: Contact Guard/Touching assist Walk up/down 4 steps assistive device: 2 hand rails  Walk up/down 12 steps activity   Walk up/down 12 steps assist level: Contact Guard/Touching assist Walk up/down 12 steps assistive device: 2 hand rails  Pick up small objects from floor   Pick up small object from the floor assist level: Contact Guard/Touching assist    Wheelchair Is the patient using  a wheelchair?: No          Wheel 50 feet with 2 turns activity      Wheel 150 feet activity        Refer to Care Plan for Long Term Goals  SHORT TERM GOAL WEEK 1 PT Short Term Goal 1 (Week 1): = to LTGs based on ELOS  Recommendations for other services: Therapeutic Recreation  Outing/community reintegration  Skilled Therapeutic Intervention Pt received sitting EOB and agreeable to therapy session. Evaluation completed (see details above) with patient education regarding purpose of PT evaluation, PT POC and goals, therapy schedule, weekly team meetings, and other CIR information including safety plan and fall risk safety. Pt performed the below functional mobility tasks with the specified levels of skilled cuing and assistance.Pt demonstrates mild R hemi (UE>LE) with mild balance impairments when performing higher level dynamic balance and gait challenges. Pt only requiring intermittent seated rest breaks during session with good activity tolerance. Greatest challenges are for pt to step over obstacles, head turning, backwards walking, and walking with eyes closed/vision occluded. At end of session, pt left seated in recliner with needs in reach and seat belt alarm on.  Mobility Bed Mobility Bed Mobility: Supine to Sit;Sit to Supine Supine to Sit: Independent with assistive device Sit to Supine: Independent with assistive device Transfers Transfers: Sit to Stand;Stand to Sit;Stand Pivot Transfers Sit to Stand: Contact Guard/Touching assist;Supervision/Verbal cueing Stand to Sit: Supervision/Verbal cueing;Contact Guard/Touching assist Stand Pivot Transfers: Contact Guard/Touching assist Transfer (Assistive device): None Locomotion  Gait Ambulation: Yes Gait Assistance: Contact Guard/Touching assist Gait Distance (Feet): 200 Feet Assistive device: None Gait Gait: Yes Gait Pattern: Impaired Gait Pattern: Poor foot clearance - right (occasional decreased R foot clearance during  swing; lack of R arm swing, decreased trunk rotation, occasional downward gaze, slight postural instability) Gait velocity: decreased Stairs / Additional Locomotion Stairs: Yes Stairs Assistance: Contact Guard/Touching assist Stair Management Technique: Two rails;Alternating pattern;Forwards Number of Stairs: 12 Height of Stairs: 6 Ramp: Contact Guard/touching assist Curb: Contact Guard/Touching assist Wheelchair Mobility Wheelchair Mobility: No   Discharge Criteria: Patient will be discharged from PT if patient refuses treatment 3 consecutive times without medical reason, if treatment goals not met, if there is a change in medical status, if patient makes no progress towards goals or if patient is discharged from hospital.  The above assessment, treatment  plan, treatment alternatives and goals were discussed and mutually agreed upon: by patient  Ginny Forth , PT, DPT, NCS, CSRS 08/01/2022, 12:23 PM

## 2022-08-01 NOTE — Patient Care Conference (Addendum)
Inpatient RehabilitationTeam Conference and Plan of Care Update Date: 08/01/2022   Time: 10:47 AM    Patient Name: Billy Kim      Medical Record Number: [id number]  Date of Birth: [birth year] Sex: Male         Room/Bed: 4W24C/4W24C-01 Payor Info: Payor: [employer] / Plan: [employer] PPO / Product Type: *No Product type* /    Admit Date/Time:  07/31/2022  3:18 PM  Primary Diagnosis:  Intraparenchymal hemorrhage of brain [facility])  Hospital Problems: Principal Problem:   Intraparenchymal hemorrhage of brain [facility])    Expected Discharge Date: Expected Discharge Date:  (evals pending)  Team Members Present: Physician leading conference: Dr. Claudette Laws Social Worker Present: Lavera Guise, BSW Nurse Present: Chana Bode, RN PT Present: Casimiro Needle, PT OT Present: Bonnell Public, OT SLP Present: Pablo Lawrence, SLP PPS Coordinator present : Fae Pippin, SLP     Current Status/Progress Goal Weekly Team Focus  Bowel/Bladder   pt continent of b/b: LBM: 6/10   gain regular bowel pattern   assist with toileting need prn    Swallow/Nutrition/ Hydration               ADL's   Per chart review, Pt at min-mod A level overall   eval pending   eval pending    Mobility               Communication                Safety/Cognition/ Behavioral Observations               Pain   no c/o pain   remain pain free   assess pain QS and prn    Skin   skin intact   maintain skin integrity  assess skin QS and prn      Discharge Planning:    2 level, main B+B, 3ste bil rails w spouse; was working PTA  Team Discussion: Patient with aphasia, anomia post ICH.   Patient on target to meet rehab goals: yes  *See Care Plan and progress notes for long and short-term goals.   Revisions to Treatment Plan:  N/a  Teaching Needs: Safety, medications, dietary modifications, etc.   Current Barriers to Discharge: Decreased caregiver support and  Home enviroment access/layout  Possible Resolutions to Barriers: Family education OP follow up services No DME     Medical Summary Current Status: Thalamic hemorrhage on the left side with resultant aphasia, weakness on right improving, labile hyper tension  Barriers to Discharge: Uncontrolled Hypertension   Possible Resolutions to [employer] Focus: Medication adjustments, speech therapy valuation.   Continued Need for Acute Rehabilitation Level of Care: The patient requires daily medical management by a physician with specialized training in physical medicine and rehabilitation for the following reasons: Direction of a multidisciplinary physical rehabilitation program to maximize functional independence : Yes Medical management of patient stability for increased activity during participation in an intensive rehabilitation regime.: Yes Analysis of laboratory values and/or radiology reports with any subsequent need for medication adjustment and/or medical intervention. : Yes   I attest that I was present, lead the team conference, and concur with the assessment and plan of the team.   Chana Bode B 08/01/2022, 2:23 PM

## 2022-08-01 NOTE — Plan of Care (Signed)
  Problem: RH Balance Goal: LTG Patient will maintain dynamic sitting balance (PT) Description: LTG:  Patient will maintain dynamic sitting balance with assistance during mobility activities (PT) Flowsheets (Taken 08/01/2022 1226) LTG: Pt will maintain dynamic sitting balance during mobility activities with:: Independent with assistive device  Goal: LTG Patient will maintain dynamic standing balance (PT) Description: LTG:  Patient will maintain dynamic standing balance with assistance during mobility activities (PT) Flowsheets (Taken 08/01/2022 1226) LTG: Pt will maintain dynamic standing balance during mobility activities with:: Supervision/Verbal cueing   Problem: Sit to Stand Goal: LTG:  Patient will perform sit to stand with assistance level (PT) Description: LTG:  Patient will perform sit to stand with assistance level (PT) Flowsheets (Taken 08/01/2022 1226) LTG: PT will perform sit to stand in preparation for functional mobility with assistance level: Independent with assistive device   Problem: RH Bed Mobility Goal: LTG Patient will perform bed mobility with assist (PT) Description: LTG: Patient will perform bed mobility with assistance, with/without cues (PT). Flowsheets (Taken 08/01/2022 1226) LTG: Pt will perform bed mobility with assistance level of: Independent with assistive device    Problem: RH Bed to Chair Transfers Goal: LTG Patient will perform bed/chair transfers w/assist (PT) Description: LTG: Patient will perform bed to chair transfers with assistance (PT). Flowsheets (Taken 08/01/2022 1226) LTG: Pt will perform Bed to Chair Transfers with assistance level: Independent with assistive device    Problem: RH Car Transfers Goal: LTG Patient will perform car transfers with assist (PT) Description: LTG: Patient will perform car transfers with assistance (PT). Flowsheets (Taken 08/01/2022 1226) LTG: Pt will perform car transfers with assist:: Independent with assistive device     Problem: RH Ambulation Goal: LTG Patient will ambulate in controlled environment (PT) Description: LTG: Patient will ambulate in a controlled environment, # of feet with assistance (PT). Flowsheets (Taken 08/01/2022 1226) LTG: Pt will ambulate in controlled environ  assist needed:: Independent with assistive device LTG: Ambulation distance in controlled environment: 153ft using LRAD Goal: LTG Patient will ambulate in home environment (PT) Description: LTG: Patient will ambulate in home environment, # of feet with assistance (PT). Flowsheets (Taken 08/01/2022 1226) LTG: Pt will ambulate in home environ  assist needed:: Independent with assistive device LTG: Ambulation distance in home environment: 37ft using LRAD Goal: LTG Patient will ambulate in community environment (PT) Description: LTG: Patient will ambulate in community environment, # of feet with assistance (PT). Flowsheets (Taken 08/01/2022 1226) LTG: Pt will ambulate in community environ  assist needed:: Supervision/Verbal cueing LTG: Ambulation distance in community environment: >270ft using LRAD   Problem: RH Stairs Goal: LTG Patient will ambulate up and down stairs w/assist (PT) Description: LTG: Patient will ambulate up and down # of stairs with assistance (PT) Flowsheets (Taken 08/01/2022 1226) LTG: Pt will ambulate up/down stairs assist needed:: Independent with assistive device LTG: Pt will  ambulate up and down number of stairs: 4 steps using HRs

## 2022-08-01 NOTE — Progress Notes (Signed)
PROGRESS NOTE   Subjective/Complaints:  Pt with mild aphasia, anomia, mainly issues with less frequently utilized words   ROS- neg CP, SOB, N/V/D  Objective:   No results found. Recent Labs    07/30/22 0525 08/01/22 0607  WBC 6.2 6.1  HGB 14.2 14.6  HCT 41.2 41.4  PLT 126* 116*   Recent Labs    07/30/22 0525 08/01/22 0607  NA 140 139  K 3.9 4.1  CL 108 107  CO2 25 25  GLUCOSE 117* 107*  BUN 31* 21  CREATININE 1.31* 1.38*  CALCIUM 8.4* 8.4*    Intake/Output Summary (Last 24 hours) at 08/01/2022 0916 Last data filed at 08/01/2022 0981 Gross per 24 hour  Intake 354 ml  Output --  Net 354 ml        Physical Exam: Vital Signs Blood pressure (!) 163/90, pulse (!) 55, temperature 97.9 F (36.6 C), temperature source Oral, resp. rate 17, height 5\' 8"  (1.727 m), weight 84.5 kg, SpO2 98 %.  General: No acute distress Mood and affect are appropriate Heart: Regular rate and rhythm no rubs murmurs or extra sounds Lungs: Clear to auscultation, breathing unlabored, no rales or wheezes Abdomen: Positive bowel sounds, soft nontender to palpation, nondistended Extremities: No clubbing, cyanosis, or edema Skin: No evidence of breakdown, no evidence of rash Neurologic: speech- names ring and watch but not stethoscope ,motor strength is 5/5 in bilateral deltoid, bicep, tricep, grip, hip flexor, knee extensors, ankle dorsiflexor and plantar flexor Sensory exam normal sensation to light touch and proprioception in bilateral upper and lower extremities Cerebellar exam normal finger to nose to finger as well as heel to shin in bilateral upper and lower extremities Musculoskeletal: Full range of motion in all 4 extremities. No joint swelling    Assessment/Plan: 1. Functional deficits which require 3+ hours per day of interdisciplinary therapy in a comprehensive inpatient rehab setting. Physiatrist is providing close team  supervision and 24 hour management of active medical problems listed below. Physiatrist and rehab team continue to assess barriers to discharge/monitor patient progress toward functional and medical goals  Care Tool:  Bathing              Bathing assist       Upper Body Dressing/Undressing Upper body dressing        Upper body assist      Lower Body Dressing/Undressing Lower body dressing            Lower body assist       Toileting Toileting    Toileting assist       Transfers Chair/bed transfer  Transfers assist           Locomotion Ambulation   Ambulation assist              Walk 10 feet activity   Assist           Walk 50 feet activity   Assist           Walk 150 feet activity   Assist           Walk 10 feet on uneven surface  activity   Assist  Wheelchair     Assist               Wheelchair 50 feet with 2 turns activity    Assist            Wheelchair 150 feet activity     Assist          Blood pressure (!) 163/90, pulse (!) 55, temperature 97.9 F (36.6 C), temperature source Oral, resp. rate 17, height 5\' 8"  (1.727 m), weight 84.5 kg, SpO2 98 %.  Medical Problem List and Plan: 1. Functional deficits secondary to intraparenchymal hemorrhage in the left thalamus without mass effect.             -patient may  shower             -ELOS/Goals: 5-7 days,  Sup with PT/OT/SLP             -Admit to CIR  2.  Antithrombotics: -DVT/anticoagulation:  Mechanical: Antiembolism stockings, thigh (TED hose) Bilateral lower extremities Patient has declined Lovenox therapy             -antiplatelet therapy: Aspirin currently on hold 3. Pain Management: Lidoderm patch  4. Mood/Behavior/Sleep: Provide emotional support             -antipsychotic agents: N/A 5. Neuropsych/cognition: This patient is able of making decisions on his own behalf. 6. Skin/Wound Care: Routine skin  checks 7. Fluids/Electrolytes/Nutrition: Routine in and outs with follow-up chemistries 8.  Hypertension.  Cozaar 50 mg daily, Coreg 3.125 mg twice daily.  Monitor with increased mobility.  Goal SBP < 160 Vitals:   07/31/22 2009 08/01/22 0523  BP: 136/82 (!) 163/90  Pulse: 75 (!) 55  Resp: 16 17  Temp: 97.7 F (36.5 C) 97.9 F (36.6 C)  SpO2: 97% 98%    9.  Hyperlipidemia.  Zetia/Vascepa 10.  History of CAD.  No chest pain or shortness of breath.  Baby aspirin on hold.  Continue Imdur 120 mg daily 11.  History of ulcerative colitis.  Protonix 40 mg daily. 12. AKI. Improving. Rechecks labs tomorrow.    LOS: 1 days A FACE TO FACE EVALUATION WAS PERFORMED  Erick Colace 08/01/2022, 9:16 AM

## 2022-08-01 NOTE — Progress Notes (Signed)
Inpatient Rehabilitation Center Individual Statement of Services  Patient Name:  Tamarick Kovalcik Washer  Date:  08/01/2022  Welcome to the Inpatient Rehabilitation Center.  Our goal is to provide you with an individualized program based on your diagnosis and situation, designed to meet your specific needs.  With this comprehensive rehabilitation program, you will be expected to participate in at least 3 hours of rehabilitation therapies Monday-Friday, with modified therapy programming on the weekends.  Your rehabilitation program will include the following services:  Physical Therapy (PT), Occupational Therapy (OT), Speech Therapy (ST), 24 hour per day rehabilitation nursing, Therapeutic Recreaction (TR), Neuropsychology, Care Coordinator, Rehabilitation Medicine, Nutrition Services, Pharmacy Services, and Other  Weekly team conferences will be held on Wednesdays to discuss your progress.  Your Inpatient Rehabilitation Care Coordinator will talk with you frequently to get your input and to update you on team discussions.  Team conferences with you and your family in attendance may also be held.  Expected length of stay: 5-7 Days  Overall anticipated outcome:  Supervision  Depending on your progress and recovery, your program may change. Your Inpatient Rehabilitation Care Coordinator will coordinate services and will keep you informed of any changes. Your Inpatient Rehabilitation Care Coordinator's name and contact numbers are listed  below.  The following services may also be recommended but are not provided by the Inpatient Rehabilitation Center:   Home Health Rehabiltiation Services Outpatient Rehabilitation Services    Arrangements will be made to provide these services after discharge if needed.  Arrangements include referral to agencies that provide these services.  Your insurance has been verified to be:   HTA Your primary doctor is:  Crawford Givens, MD  Pertinent information will be shared  with your doctor and your insurance company.  Inpatient Rehabilitation Care Coordinator:  Lavera Guise, [place] [phone] or [phone]  Information discussed with and copy given to patient by: Andria Rhein, 08/01/2022, 9:36 AM

## 2022-08-01 NOTE — Progress Notes (Signed)
Inpatient Rehabilitation Admission Medication Review by a Pharmacist  A complete drug regimen review was completed for this patient to identify any potential clinically significant medication issues.  High Risk Drug Classes Is patient taking? Indication by Medication  Antipsychotic No   Anticoagulant No   Antibiotic No   Opioid No   Antiplatelet No   Hypoglycemics/insulin No   Vasoactive Medication Yes Carvedilol, Imdur, Losartan -HTN, CAD  Chemotherapy No   Other Yes Ezetimibe, Icosapent - HLD Lidocaine patches - pain Pantoprazole - Reflux     Type of Medication Issue Identified Description of Issue Recommendation(s)  Drug Interaction(s) (clinically significant)     Duplicate Therapy     Allergy     No Medication Administration End Date     Incorrect Dose     Additional Drug Therapy Needed     Significant med changes from prior encounter (inform family/care partners about these prior to discharge).    Other       Clinically significant medication issues were identified that warrant physician communication and completion of prescribed/recommended actions by midnight of the next day:  No  Time spent performing this drug regimen review (minutes):  20 minutes  Thank you Okey Regal, PharmD

## 2022-08-01 NOTE — Telephone Encounter (Signed)
Patient's wife is requesting call back to discuss and to get advice from Dr. Okey Dupre as to who the patient may want to see for neurology after having stroke. Please advise.

## 2022-08-01 NOTE — Progress Notes (Signed)
Inpatient Rehabilitation  Patient information reviewed and entered into eRehab system by Abimelec Grochowski M. Xiadani Damman, M.A., CCC/SLP, PPS Coordinator.  Information including medical coding, functional ability and quality indicators will be reviewed and updated through discharge.    

## 2022-08-01 NOTE — Progress Notes (Signed)
Patient ID: Billy Kim, male   DOB: 1946-02-15, 77 y.o.   MRN: [id number]  Team Conference Report to Patient/Family  Team Conference discussion was reviewed with the patient and caregiver, including goals, any changes in plan of care and target discharge date.  Patient and caregiver express understanding and are in agreement.  The patient has a target discharge date of  (evals pending).  Sw met with patient introduced self and explained role. Patient discharging home with spouse. Patient evaluations currently pending and team will determine a d/c date later. No additional questions or concerns.  Andria Rhein 08/01/2022, 1:59 PM

## 2022-08-01 NOTE — Telephone Encounter (Signed)
Left a message for the patient to call back.  

## 2022-08-02 DIAGNOSIS — I619 Nontraumatic intracerebral hemorrhage, unspecified: Secondary | ICD-10-CM | POA: Diagnosis not present

## 2022-08-02 NOTE — Progress Notes (Signed)
Physical Therapy Session Note  Patient Details  Name: Billy Kim MRN: [id number] Date of Birth: 12/04/1945  Today's Date: 08/02/2022 PT Individual Time: 8657-8469 + 1000-1110 PT Individual Time Calculation (min): 55 min  + 70 min  Short Term Goals: Week 1:  PT Short Term Goal 1 (Week 1): = to LTGs based on ELOS  Skilled Therapeutic Interventions/Progress Updates:     Session 1: Chart reviewed and pt agreeable to therapy. Pt received semi-reclined in bed with no c/o pain. Session focused on amb quality and endurance in outdoor setting to promote community and vocational reintegration. Pt initiated session with set up A for UB dressing. Pt then amb >1538ft for 34min+ 10 min around 1st floor and outdoor spaces using close S + no AD. Terrain included uneven concrete surfaces, hills, grass, gravel, stairs, and crosswalks. Pt noted to have increased R inattention and R toe catching in increased fatigue. Pt verbalized understanding of education to practice safe judgement of energy level to monitor safety of amb if experiencing RLE fatigue. Pt returned to room and completed toileting with distant S. Session education included CIR fall prevention policies, for which pt verbalized understanding and agreement. At end of session, pt was left semi-reclined in bed with alarm engaged, nurse call bell and all needs in reach.  Session 2: Chart reviewed and pt agreeable to therapy. Pt received semi-reclined in bed with no c/o pain. Session focused on functional mobility and transfers to promote return to vocation. Pt initiated session with amb to therapy gym for 259ft with close S + no AD. Pt then completed blocked practice of kneeling and return to stand with CGA progressing to S with repeated practice. Pt then carried water-filled container with S + no AD and no LOB. Pt then completed NuStep for 12 mins of interval training at workloads 5-10 with half time using BUE +BLE and half time using BLE only. Pt then  completed series of balance exercises including toe taps to single, central target and multiple, peripheral targets. Pt required CGA for toe taps and VC for pacing for successful foot placment. Session education emphasized slowing pace with RLE to ensure safety. At end of session, pt was left seated in recliner with alarm engaged, nurse call bell and all needs in reach.    Therapy Documentation Precautions:  Precautions Precautions: Fall, Other (comment) Precaution Comments: R inattention, mild R hemi Restrictions Weight Bearing Restrictions: No General:      Therapy/Group: Individual Therapy  Dionne Milo, PT, DPT 08/02/2022, 10:09 AM

## 2022-08-02 NOTE — IPOC Note (Signed)
Overall Plan of Care [facility]) Patient Details Name: Billy Kim MRN: [id number] DOB: 1945-12-19  Admitting Diagnosis: Intraparenchymal hemorrhage of brain [facility]  Hospital Problems: Principal Problem:   Intraparenchymal hemorrhage of brain (HCC)     Functional Problem List: Nursing Endurance, Medication Management, Pain, Safety, Bowel  PT Balance, Safety, Perception, Skin Integrity, Endurance, Motor, Nutrition, Pain  OT Balance, Endurance, Perception, Behavior, Motor, Safety, Cognition, Pain  SLP Linguistic  TR Endurance, Motor       Basic ADL's: OT Bathing, Dressing, Toileting     Advanced  ADL's: OT Light Housekeeping, Simple Meal Preparation     Transfers: PT Bed Mobility, Car, Bed to Chair, Floor  OT Toilet, Tub/Shower     Locomotion: PT Ambulation, Stairs     Additional Impairments: OT Fuctional Use of Upper Extremity  SLP Communication expression    TR      Anticipated Outcomes Item Anticipated Outcome  Self Feeding Independent  Swallowing      Basic self-care  Independent  Toileting  Independent   Bathroom Transfers Superivsion  Bowel/Bladder  manage bowel w mod I assist  Transfers  mod-I using LRAD  Locomotion  mod-I household levels  Communication  Sup A  Cognition     Pain  < 4 with prns  Safety/Judgment  manage with cues   Therapy Plan: PT Intensity: Minimum of 1-2 x/day ,45 to 90 minutes PT Frequency: 5 out of 7 days PT Duration Estimated Length of Stay: ~5-7 days OT Intensity: Minimum of 1-2 x/day, 45 to 90 minutes OT Frequency: 5 out of 7 days OT Duration/Estimated Length of Stay: 5-7 days SLP Intensity: Minumum of 1-2 x/day, 30 to 90 minutes SLP Frequency: 1 to 3 out of 7 days SLP Duration/Estimated Length of Stay: 5- 7 days   Team Interventions: Nursing Interventions Patient/Family Education, Bowel Management, Disease Management/Prevention, Medication Management, Discharge Planning  PT interventions Ambulation/gait training,  Community reintegration, DME/adaptive equipment instruction, Neuromuscular re-education, Psychosocial support, Stair training, UE/LE Strength taining/ROM, Warden/ranger, Discharge planning, Functional electrical stimulation, Pain management, Skin care/wound management, Therapeutic Activities, UE/LE Coordination activities, Cognitive remediation/compensation, Functional mobility training, Disease management/prevention, Patient/family education, Splinting/orthotics, Therapeutic Exercise, Visual/perceptual remediation/compensation  OT Interventions Warden/ranger, Community reintegration, Disease mangement/prevention, Functional electrical stimulation, Neuromuscular re-education, Patient/family education, Self Care/advanced ADL retraining, Therapeutic Exercise, UE/LE Coordination activities, Cognitive remediation/compensation, Discharge planning, DME/adaptive equipment instruction, Functional mobility training, Pain management, Psychosocial support, Therapeutic Activities, UE/LE Strength taining/ROM, Visual/perceptual remediation/compensation  SLP Interventions Cueing hierarchy, Internal/external aids, Speech/Language facilitation, Patient/family education, Multimodal communication approach, Functional tasks, Therapeutic Activities  TR Interventions Community reintegration, Equities trader education, Leisure education, Functional mobility training  SW/CM Interventions Discharge Planning, Psychosocial Support, Patient/Family Education, Disease Management/Prevention   Barriers to Discharge MD  Medical stability  Nursing Decreased caregiver support 2 level main B+B 3 ste bil rails w spouse  PT      OT      SLP      SW Insurance for SNF coverage, Decreased caregiver support, Lack of/limited family support     Team Discharge Planning: Destination: PT-Home ,OT- Home , SLP-Home Projected Follow-up: PT-Outpatient PT, 24 hour supervision/assistance, OT-  Outpatient OT, SLP-24 hour  supervision/assistance, Outpatient SLP Projected Equipment Needs: PT-None recommended by PT, OT- To be determined, SLP-None recommended by SLP Equipment Details: PT- , OT-  Patient/family involved in discharge planning: PT- Patient,  OT-Patient, SLP-Patient  MD ELOS: 5-7d Medical Rehab Prognosis:  Excellent Assessment: The patient has been admitted for CIR therapies with the diagnosis of LEFT ICH. The  team will be addressing functional mobility, strength, stamina, balance, safety, adaptive techniques and equipment, self-care, bowel and bladder mgt, patient and caregiver education, BP management . Goals have been set at Mod I. Anticipated discharge destination is Home .        See Team Conference Notes for weekly updates to the plan of care

## 2022-08-02 NOTE — Progress Notes (Signed)
Physical Therapy Session Note  Patient Details  Name: Billy Kim MRN: [id number] Date of Birth: [birth year]  Today's Date: 08/02/2022 PT Individual Time: 1425-1505 PT Individual Time Calculation (min): 40 min   Short Term Goals: Week 1:  PT Short Term Goal 1 (Week 1): = to LTGs based on ELOS  Skilled Therapeutic Interventions/Progress Updates:    Pt received supine in bed awake and agreeable to therapy session. Supine>sitting L EOB, mod-I. Sitting EOB donned tennis shoes set-up assist. Pt requesting for assistance to call his wife to have her bring in more clothes. Educated him on how to use phone, wrote wife's number down, and pt able to use R hand to dial the numbers for NMR - she did not answer so assisted pt in leaving a voicemail due to his aphasia. Sit<>stands with supervision for safety during session.  Gait training >228ft on CIR, no AD, with supervision for safety - pt continues to demo decreased R arm swing associated with decreased trunk rotation as well as occasional R R toes catching slightly when fatigued.   Dynamic gait training with cognitive dual-task of locating numbered disks (3-12) in hallway in ascending order progressed to evens then odds in ascending order - close supervision for balance throughout but no instances of LOB - used R hand to pick-up disks for NMR (towards end as pt hand becomes full, he will feel like he picked up a disk but didn't actually grab it, requiring him to slow down and use his vision to ensure he completed task fully) - provided encouragement of increased speed with goal to complete task in <58min20sec with pt requiring ~30-45 more seconds on 1st round then only 6 seconds over on 2nd round with pt's visual scanning to locate targets and recall of target locations improving.  Stair navigation training ascending/descending 11 steps x2 in stairwell (6" height) using R UE support on each HR with CGA for safety - reciprocal pattern in both directions - pt  catches R toes 1x on ascent, but with verbal cuing improves remainder of the time.  At end of session, pt left seated on EOB with needs in reach and bed alarm on.  Therapy Documentation Precautions:  Precautions Precautions: Fall, Other (comment) Precaution Comments: R inattention, mild R hemi Restrictions Weight Bearing Restrictions: No   Pain:  No reports of pain throughout session.    Therapy/Group: Individual Therapy  Ginny Forth , PT, DPT, NCS, CSRS 08/02/2022, 1:06 PM

## 2022-08-02 NOTE — Evaluation (Signed)
Recreational Therapy Assessment and Plan  Patient Details  Name: Billy Kim MRN: [id number] Date of Birth: 01/25/1946 Today's Date: 08/02/2022  Rehab Potential: Excellent ELOS: d/c 6/18   Assessment Hospital Problem: Principal Problem:   Intraparenchymal hemorrhage of brain [facility])     Past Medical History:      Past Medical History:  Diagnosis Date   Coronary artery disease 01/2020    Moderate proximal LAD disease (not hemodynamically significant) and CTO's of D1 and non-dominant RCA   HLD (hyperlipidemia)     Hypertension     Ulcerative colitis 5/81    Remission for years    Past Surgical History:       Past Surgical History:  Procedure Laterality Date   CARDIAC CATHETERIZATION   03/20/01    Cardiolite EF 55% 02/10/02   CHOLECYSTECTOMY N/A 07/09/2017    Procedure: LAPAROSCOPIC CHOLECYSTECTOMY;  Surgeon: Jimmye Norman, MD;  Location: [facility] OR;  Service: General;  Laterality: N/A;   COLONOSCOPY   multiple   ENDOSCOPIC RETROGRADE CHOLANGIOPANCREATOGRAPHY (ERCP) WITH PROPOFOL N/A 07/08/2017    Procedure: ENDOSCOPIC RETROGRADE CHOLANGIOPANCREATOGRAPHY (ERCP) WITH PROPOFOL;  Surgeon: Lynann Bologna, MD;  Location: [facility] ENDOSCOPY;  Service: Endoscopy;  Laterality: N/A;   INGUINAL HERNIA REPAIR   04/09/06    Bilateral   LAPAROSCOPIC APPENDECTOMY   03/1981   LEFT HEART CATH AND CORONARY ANGIOGRAPHY Left 12/11/2019    Procedure: LEFT HEART CATH AND CORONARY ANGIOGRAPHY;  Surgeon: Yvonne Kendall, MD;  Location: [facility] INVASIVE CV LAB;  Service: Cardiovascular;  Laterality: Left;   REMOVAL OF STONES   07/08/2017    Procedure: REMOVAL OF STONES;  Surgeon: Lynann Bologna, MD;  Location: [facility] ENDOSCOPY;  Service: Endoscopy;;   SPHINCTEROTOMY   07/08/2017    Procedure: Dennison Mascot;  Surgeon: Lynann Bologna, MD;  Location: [facility] ENDOSCOPY;  Service: Endoscopy;;      Assessment & Plan Clinical Impression: Billy Kim is a 77 year old right-handed male with history of CAD maintained on low-dose  aspirin, hyperlipidemia, hypertension, ulcerative colitis that is in remission.  Per chart review patient lives with spouse.  Two-level home bed and bath main level 3 steps to entry.  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  Presented to [facility] 07/25/2022 with word finding difficulty and gait instability.  Admission chemistries unremarkable except BUN 26 creatinine 1.42, total bilirubin 2.4.  Cranial CT scan showed acute intraparenchymal hemorrhage in the left thalamus measuring 1.8 x 1.4 x 1.2 cm.  No midline shift or mass effect.  CT angiogram of the head showed no aneurysm or vascular malformation.  MRI follow-up no mass or other unexpected findings underlying the patient's left thalamic hematoma.  Initially on Cleviprex for blood pressure control.  Neurosurgery as well as neurology follow-up with conservative care.  Echo with ejection fraction of 60 to 65% no wall motion abnormalities grade 1 diastolic dysfunction.  Tolerating a regular consistency diet.  Neurology recommended DVT prophylaxis with Lovenox however patient would like to only be treated with SCDs for DVT prophylaxis.   Patient transferred to CIR on 07/31/2022 .     Patient currently requires supervision-CGA with basic self-care skills and IADL secondary to muscle weakness, decreased cardiorespiratoy endurance, decreased coordination, decreased attention to right, decreased memory, central origin, and decreased sitting balance, decreased standing balance, decreased postural control, hemiplegia, and decreased balance strategies.     Pt presents with decreased activity tolerance, decreased functional mobility, decreased balance, decreased coordination, right inattention,decreased memory, decreased leisure awareness Limiting pt's independence with leisure/community pursuits.  Met with pt today to discuss TR services including leisure education & community reintegration.  Pt referred for team for an outing prior to  discharge.  Discussed the purpose of an outing and potential goals.  Pt agreeable to participate in a outing tomorrow.  Leisure History/Participation Identified Leisure Barriers: pt with little time for leisure PTA, pt still working full time Leisure Participation Style: Alone;With Family/Friends Psychosocial / Spiritual Spiritual Interests: Church Social interaction - Mood/Behavior: Cooperative Firefighter Appropriate for Education?: Yes Patient Agreeable to [employer]?: Yes Recreational Therapy Orientation Orientation -Reviewed with patient: Available activity resources;Use of Dayroom Strengths/Weaknesses Patient Strengths/Abilities: Willingness to participate;Active premorbidly Patient weaknesses: Physical limitations;Minimal Premorbid Leisure Activity TR Patient demonstrates impairments in the following area(s): Endurance;Motor  Plan Rec Therapy Plan Is patient appropriate for Therapeutic Recreation?: Yes Rehab Potential: Excellent Treatment times per week: Min 1 TR session >60 minutes for community reintegration during LOS Estimated Length of Stay: d/c 6/18 TR Treatment/Interventions: Community reintegration;Patient/family education;Leisure education;Functional mobility training  Recommendations for other services: None   Discharge Criteria: Patient will be discharged from TR if patient refuses treatment 3 consecutive times without medical reason.  If treatment goals not met, if there is a change in medical status, if patient makes no progress towards goals or if patient is discharged from hospital.  The above assessment, treatment plan, treatment alternatives and goals were discussed and mutually agreed upon: by patient  Billy Kim 08/02/2022, 9:47 AM

## 2022-08-02 NOTE — Progress Notes (Signed)
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 Recent Labs    08/01/22 0607  WBC 6.1  HGB 14.6  HCT 41.4  PLT 116*    Recent Labs    08/01/22 0607  NA 139  K 4.1  CL 107  CO2 25  GLUCOSE 107*  BUN 21  CREATININE 1.38*  CALCIUM 8.4*     Intake/Output Summary (Last 24 hours) at 08/02/2022 0853 Last data filed at 08/02/2022 0834 Gross per 24 hour  Intake 598 ml  Output 0 ml  Net 598 ml         Physical Exam: Vital Signs Blood pressure (!) 158/94, pulse (!) 57, temperature 97.7 F (36.5 C), temperature source Oral, resp. rate 20, height 5\' 8"  (1.727 m), weight 80.8 kg, SpO2 99 %.  General: No acute distress Mood and affect are appropriate Heart: Regular rate and rhythm no rubs murmurs or extra sounds Lungs: Clear to auscultation, breathing unlabored, no rales or wheezes Abdomen: Positive bowel sounds, soft nontender to palpation, nondistended Extremities: No clubbing, cyanosis, or edema Skin: No evidence of breakdown, no evidence of rash Neurologic: speech- names ring and watch but not stethoscope ,motor strength is 5/5 in bilateral deltoid, bicep, tricep, grip, hip flexor, knee extensors, ankle dorsiflexor and plantar flexor Sensory exam normal sensation to light touch and proprioception in bilateral upper and lower extremities Cerebellar exam normal finger to nose to finger as well as heel to shin in bilateral upper and lower extremities Musculoskeletal: Full range of motion in all 4 extremities. No joint swelling    Assessment/Plan: 1. Functional deficits which require 3+ hours per day of interdisciplinary therapy in a comprehensive inpatient rehab setting. Physiatrist is providing close team supervision and 24 hour management of active medical  problems listed below. Physiatrist and rehab team continue to assess barriers to discharge/monitor patient progress toward functional and medical goals  Care Tool:  Bathing    Body parts bathed by patient: Right arm, Left arm, Chest, Abdomen, Front perineal area, Buttocks, Right upper leg, Left upper leg, Right lower leg, Left lower leg, Face         Bathing assist Assist Level: Contact Guard/Touching assist     Upper Body Dressing/Undressing Upper body dressing   What is the patient wearing?: Pull over shirt    Upper body assist Assist Level: Supervision/Verbal cueing    Lower Body Dressing/Undressing Lower body dressing      What is the patient wearing?: Pants, Underwear/pull up     Lower body assist Assist for lower body dressing: Contact Guard/Touching assist     Toileting Toileting    Toileting assist Assist for toileting: Contact Guard/Touching assist     Transfers Chair/bed transfer  Transfers assist     Chair/bed transfer assist level: Contact Guard/Touching assist     Locomotion Ambulation   Ambulation assist      Assist level: Contact Guard/Touching assist Assistive device: No Device Max distance: 220ft   Walk 10 feet activity  Assist     Assist level: Contact Guard/Touching assist Assistive device: No Device   Walk 50 feet activity   Assist    Assist level: Contact Guard/Touching assist Assistive device: No Device    Walk 150 feet activity   Assist    Assist level: Contact Guard/Touching assist Assistive device: No Device    Walk 10 feet on uneven surface  activity   Assist     Assist level: Contact Guard/Touching assist Assistive device: Other (comment) (none)   Wheelchair     Assist Is the patient using a wheelchair?: No             Wheelchair 50 feet with 2 turns activity    Assist            Wheelchair 150 feet activity     Assist          Blood pressure (!) 158/94, pulse  (!) 57, temperature 97.7 F (36.5 C), temperature source Oral, resp. rate 20, height 5\' 8"  (1.727 m), weight 80.8 kg, SpO2 99 %.  Medical Problem List and Plan: 1. Functional deficits secondary to intraparenchymal hemorrhage in the left thalamus without mass effect.             -patient may  shower             -ELOS/Goals: 6/18,  Sup with PT/OT/SLP             -Admit to CIR  2.  Antithrombotics: -DVT/anticoagulation:  Mechanical: Antiembolism stockings, thigh (TED hose) Bilateral lower extremities Patient has declined Lovenox therapy             -antiplatelet therapy: Aspirin currently on hold 3. Pain Management: Lidoderm patch  4. Mood/Behavior/Sleep: Provide emotional support             -antipsychotic agents: N/A 5. Neuropsych/cognition: This patient is able of making decisions on his own behalf. 6. Skin/Wound Care: Routine skin checks 7. Fluids/Electrolytes/Nutrition: Routine in and outs with follow-up chemistries 8.  Hypertension.  Cozaar 50 mg daily, Coreg 3.125 mg twice daily.  Monitor with increased mobility.  Goal SBP < 160 Vitals:   08/01/22 2003 08/02/22 0540  BP: 132/86 (!) 158/94  Pulse: 62 (!) 57  Resp: 20 20  Temp: 98.2 F (36.8 C) 97.7 F (36.5 C)  SpO2: 100% 99%   Would not increase coreg due to HR in 55-65 range, consider increased losartan if BP remains elevated  9.  Hyperlipidemia.  Zetia/Vascepa 10.  History of CAD.  No chest pain or shortness of breath.  Baby aspirin on hold.  Continue Imdur 120 mg daily 11.  History of ulcerative colitis.  Protonix 40 mg daily. 12. AKI. Improving. Rechecks labs tomorrow.    LOS: 2 days A FACE TO FACE EVALUATION WAS PERFORMED  Erick Colace 08/02/2022, 8:53 AM

## 2022-08-02 NOTE — Progress Notes (Signed)
Patient ID: MARTAVIS GURNEY, male   DOB: December 27, 1945, 77 y.o.   MRN: [id number] Met with the patient and wife to review current situation, team conference (Wed.) rehab process and plan of care. Discussed medications with dietary modifications for secondary risk management; including HTN, CAD/CVD, Hereditary elevated triglycerides on Zetia and Vescepa. Reviewed access to My Chart account. Continue to follow along to address educational needs to facilitate preparation for discharge. Pamelia Hoit

## 2022-08-02 NOTE — Discharge Summary (Signed)
Physician Discharge Summary  Patient ID: Billy Kim MRN: [id number] DOB/AGE: September 21, 1945 77 y.o.  Admit date: 07/31/2022 Discharge date: 08/07/2022  Discharge Diagnoses:  Principal Problem:   Intraparenchymal hemorrhage of brain (HCC) Hypertension Hyperlipidemia History of CAD History of ulcerative colitis AKI  Discharged Condition: Stable  Significant Diagnostic Studies: CT HEAD WO CONTRAST ( )  Result Date: 07/27/2022 CLINICAL DATA:  Neuro deficit, acute, stroke suspected increasing R hemiparesis and hemineglect in patient with L thalamic IPH EXAM: CT HEAD WITHOUT CONTRAST TECHNIQUE: Contiguous axial images were obtained from the base of the skull through the vertex without intravenous contrast. RADIATION DOSE REDUCTION: This exam was performed according to the departmental dose-optimization program which includes automated exposure control, adjustment of the mA and/or kV according to patient size and/or use of iterative reconstruction technique. COMPARISON:  CT head June 6, 24. FINDINGS: Brain: Similar size of a left thalamic intraparenchymal hemorrhage. No progressive mass effect. No evidence of acute large vascular territory infarct, midline shift, or hydrocephalus. Vascular: No hyperdense vessel. Skull: No acute fracture. Sinuses/Orbits: Clear sinuses.  No acute findings. Other: No mastoid effusions. IMPRESSION: Similar size of a left thalamic intraparenchymal hemorrhage. No progressive mass effect. Electronically Signed   By: Feliberto Harts M.D.   On: 07/27/2022 17:38   ECHOCARDIOGRAM COMPLETE  Result Date: 07/26/2022    ECHOCARDIOGRAM REPORT   Patient Name:   KASCH PASTERNACK Ryther Date of Exam: 07/26/2022 Medical Rec #:  [id number]    Height:       68.0 in Accession #:    [id number]   Weight:       206.1 lb Date of Birth:  1945-04-07     BSA:          2.070 m Patient Age:    77 years     BP:           146/83 mmHg Patient Gender: M            HR:           63 bpm. Exam Location:  [facility] Procedure:  2D Echo, Color Doppler and Cardiac Doppler Indications:     Stroke I63.9  History:         Patient has prior history of Echocardiogram examinations, most                  recent 10/02/2019. CAD; Risk Factors:Dyslipidemia and                  Hypertension.  Sonographer:     Cristela Blue Referring Phys:  [id number] Malachi Carl STACK Diagnosing Phys: Julien Nordmann MD  Sonographer Comments: Suboptimal apical window. Apicals are off axis. IMPRESSIONS  1. Left ventricular ejection fraction, by estimation, is 60 to 65%. The left ventricle has normal function. The left ventricle has no regional wall motion abnormalities. Left ventricular diastolic parameters are consistent with Grade I diastolic dysfunction (impaired relaxation).  2. Right ventricular systolic function is normal. The right ventricular size is normal. There is normal pulmonary artery systolic pressure. The estimated right ventricular systolic pressure is 15.8 mmHg.  3. The mitral valve is normal in structure. No evidence of mitral valve regurgitation. No evidence of mitral stenosis.  4. The aortic valve is normal in structure. Aortic valve regurgitation is not visualized. No aortic stenosis is present.  5. The inferior vena cava is normal in size with greater than 50% respiratory variability, suggesting right atrial pressure of 3 mmHg. FINDINGS  Left Ventricle: Left ventricular ejection fraction,  by estimation, is 60 to 65%. The left ventricle has normal function. The left ventricle has no regional wall motion abnormalities. The left ventricular internal cavity size was normal in size. There is  no left ventricular hypertrophy. Left ventricular diastolic parameters are consistent with Grade I diastolic dysfunction (impaired relaxation). Right Ventricle: The right ventricular size is normal. No increase in right ventricular wall thickness. Right ventricular systolic function is normal. There is normal pulmonary artery systolic pressure. The tricuspid regurgitant  velocity is 1.64 m/s, and  with an assumed right atrial pressure of 5 mmHg, the estimated right ventricular systolic pressure is 15.8 mmHg. Left Atrium: Left atrial size was normal in size. Right Atrium: Right atrial size was normal in size. Pericardium: There is no evidence of pericardial effusion. Mitral Valve: The mitral valve is normal in structure. No evidence of mitral valve regurgitation. No evidence of mitral valve stenosis. MV peak gradient, 3.4 mmHg. The mean mitral valve gradient is 2.0 mmHg. Tricuspid Valve: The tricuspid valve is normal in structure. Tricuspid valve regurgitation is not demonstrated. No evidence of tricuspid stenosis. Aortic Valve: The aortic valve is normal in structure. Aortic valve regurgitation is not visualized. No aortic stenosis is present. Aortic valve mean gradient measures 2.0 mmHg. Aortic valve peak gradient measures 3.6 mmHg. Pulmonic Valve: The pulmonic valve was normal in structure. Pulmonic valve regurgitation is not visualized. No evidence of pulmonic stenosis. Aorta: The aortic root is normal in size and structure. Venous: The inferior vena cava is normal in size with greater than 50% respiratory variability, suggesting right atrial pressure of 3 mmHg. IAS/Shunts: No atrial level shunt detected by color flow Doppler.  LEFT VENTRICLE PLAX 2D LVIDd:         4.10 cm   Diastology LVIDs:         2.70 cm   LV e' medial:    5.98 cm/s LV PW:         1.20 cm   LV E/e' medial:  9.7 LV IVS:        1.10 cm   LV e' lateral:   11.00 cm/s LVOT diam:     2.00 cm   LV E/e' lateral: 5.3 LVOT Area:     3.14 cm  RIGHT VENTRICLE RV Basal diam:  2.80 cm RV Mid diam:    2.60 cm LEFT ATRIUM             Index        RIGHT ATRIUM           Index LA diam:        3.90 cm 1.88 cm/m   RA Area:     12.00 cm LA Vol (A2C):   40.9 ml 19.76 ml/m  RA Volume:   21.30 ml  10.29 ml/m LA Vol (A4C):   66.3 ml 32.03 ml/m LA Biplane Vol: 53.6 ml 25.89 ml/m  AORTIC VALVE AV Vmax:      94.55 cm/s AV Vmean:      65.550 cm/s AV VTI:       0.197 m AV Peak Grad: 3.6 mmHg AV Mean Grad: 2.0 mmHg  AORTA Ao Root diam: 3.60 cm MITRAL VALVE               TRICUSPID VALVE MV Area (PHT): 2.36 cm    TR Peak grad:   10.8 mmHg MV Peak grad:  3.4 mmHg    TR Vmax:        164.00 cm/s MV Mean grad:  2.0  mmHg MV Vmax:       0.92 m/s    SHUNTS MV Vmean:      57.7 cm/s   Systemic Diam: 2.00 cm MV Decel Time: 321 msec MV E velocity: 58.20 cm/s MV A velocity: 88.00 cm/s MV E/A ratio:  0.66 Julien Nordmann MD Electronically signed by Julien Nordmann MD Signature Date/Time: 07/26/2022/3:15:32 PM    Final    MR BRAIN W WO CONTRAST  Result Date: 07/26/2022 CLINICAL DATA:  Follow-up intracranial hemorrhage. EXAM: MRI HEAD WITHOUT AND WITH CONTRAST TECHNIQUE: Multiplanar, multiecho pulse sequences of the brain and surrounding structures were obtained without and with intravenous contrast. CONTRAST:  8mL GADAVIST GADOBUTROL 1 MMOL/ML IV SOLN COMPARISON:  CT and CTA from yesterday. FINDINGS: Brain: Known acute hemorrhage in the left thalamus measuring up to 2 cm. Faint rim of enhancement which is likely reactive, no spot sign or vascular lesion seen underlying the hematoma on recent CTA. No underlying masslike findings. There is a small rim of edema. No intraventricular extension. Mild FLAIR hyperintensity in the cerebral white matter for age, attributed to chronic small vessel ischemia. There are chronic microhemorrhages primarily in the deep brain and best attributed to chronic hypertension. No hydrocephalus. Vascular: Major flow voids and vascular enhancements are preserved Skull and upper cervical spine: Normal marrow signal Sinuses/Orbits: Negative IMPRESSION: 1. No mass or other unexpected finding underlying the patient's left thalamic hematoma. 2. Chronic small vessel ischemia with chronic microhemorrhages. Electronically Signed   By: Tiburcio Pea M.D.   On: 07/26/2022 07:55   CT HEAD WO CONTRAST ( )  Result Date: 07/26/2022 CLINICAL  DATA:  Intracranial hemorrhage follow up EXAM: CT HEAD WITHOUT CONTRAST TECHNIQUE: Contiguous axial images were obtained from the base of the skull through the vertex without intravenous contrast. RADIATION DOSE REDUCTION: This exam was performed according to the departmental dose-optimization program which includes automated exposure control, adjustment of the mA and/or kV according to patient size and/or use of iterative reconstruction technique. COMPARISON:  07/25/2022 at 6:57 p.m. FINDINGS: Brain: Left thalamic intraparenchymal hematoma has slightly increased in size to 2.3 x 1.6 x 1.4 cm (2.5 mL, previously 1.5 mL). The examination is otherwise unchanged. Vascular: No hyperdense vessel or unexpected calcification. Skull: Normal. Negative for fracture or focal lesion. Sinuses/Orbits: No acute finding. Other: None. IMPRESSION: Left thalamic intraparenchymal hematoma has slightly increased in size to 2.3 x 1.6 x 1.4 cm (2.5 mL, previously 1.5 mL). The examination is otherwise unchanged. Electronically Signed   By: Deatra Robinson M.D.   On: 07/26/2022 00:58   CT ANGIO HEAD CODE STROKE  Result Date: 07/25/2022 CLINICAL DATA:  Acute neurologic deficit.  Intracranial hemorrhage. EXAM: CT ANGIOGRAPHY HEAD TECHNIQUE: Multidetector CT imaging of the head was performed using the standard protocol during bolus administration of intravenous contrast. Multiplanar CT image reconstructions and MIPs were obtained to evaluate the vascular anatomy. RADIATION DOSE REDUCTION: This exam was performed according to the departmental dose-optimization program which includes automated exposure control, adjustment of the mA and/or kV according to patient size and/or use of iterative reconstruction technique. CONTRAST:  75mL OMNIPAQUE IOHEXOL 350 MG/ML SOLN COMPARISON:  None Available. FINDINGS: POSTERIOR CIRCULATION: --Vertebral arteries: Normal --Inferior cerebellar arteries: Normal. --Basilar artery: Normal. --Superior cerebellar  arteries: Normal. --Posterior cerebral arteries: Normal. ANTERIOR CIRCULATION: --Intracranial internal carotid arteries: Normal. --Anterior cerebral arteries (ACA): Normal. --Middle cerebral arteries (MCA): Normal. Venous sinuses: As permitted by contrast timing, patent. Anatomic variants: None Review of the MIP images confirms the above findings. IMPRESSION: 1. Normal CTA  of the head.  No aneurysm or vascular malformation. 2. Unchanged appearance of left thalamic intraparenchymal hematoma, most consistent with hypertensive hemorrhage. Electronically Signed   By: Deatra Robinson M.D.   On: 07/25/2022 21:09   CT HEAD CODE STROKE WO CONTRAST  Result Date: 07/25/2022 CLINICAL DATA:  Code stroke.  Acute neurologic deficit EXAM: CT HEAD WITHOUT CONTRAST TECHNIQUE: Contiguous axial images were obtained from the base of the skull through the vertex without intravenous contrast. RADIATION DOSE REDUCTION: This exam was performed according to the departmental dose-optimization program which includes automated exposure control, adjustment of the mA and/or kV according to patient size and/or use of iterative reconstruction technique. COMPARISON:  None Available. FINDINGS: Brain: Acute intraparenchymal hemorrhage in the left thalamus measuring 1.8 x 1.4 x 1.2 cm (approximately 1.5 mL). Minimal edema. No midline shift or other mass effect. The size and configuration of the ventricles and extra-axial CSF spaces are normal. The brain parenchyma is normal, without evidence of acute or chronic infarction. Vascular: No abnormal hyperdensity of the major intracranial arteries or dural venous sinuses. No intracranial atherosclerosis. Skull: The visualized skull base, calvarium and extracranial soft tissues are normal. Sinuses/Orbits: No fluid levels or advanced mucosal thickening of the visualized paranasal sinuses. No mastoid or middle ear effusion. The orbits are normal. ASPECTS (Alberta Stroke Program Early CT Score) Not reported in  the setting of acute hemorrhage. IMPRESSION: 1. Acute intraparenchymal hemorrhage in the left thalamus measuring 1.8 x 1.4 x 1.2 cm (approximately 1.5 mL). 2. No midline shift or other mass effect. Critical Value/emergent results were called by telephone at the time of interpretation on 07/25/2022 at 7:06 pm to provider [facility] , who verbally acknowledged these results. Electronically Signed   By: Deatra Robinson M.D.   On: 07/25/2022 19:07    Labs:  Basic Metabolic Panel: Recent Labs  Lab 08/01/22 0607 08/06/22 0607  NA 139 143  K 4.1 3.9  CL 107 104  CO2 25 23  GLUCOSE 107* 99  BUN 21 20  CREATININE 1.38* 1.35*  CALCIUM 8.4* 8.9    CBC: Recent Labs  Lab 08/01/22 0607 08/06/22 0607  WBC 6.1 6.2  NEUTROABS 3.8  --   HGB 14.6 15.0  HCT 41.4 42.9  MCV 85.2 85.1  PLT 116* 122*    CBG: Recent Labs  Lab 08/01/22 1222  GLUCAP 96   Family history.  Father with CVA CAD.  Sister with hypertension Brother with prostate cancer.  Negative for colon cancer esophageal cancer or rectal cancer  Brief HPI:   Kemarrion Okray Heckman is a 77 y.o. right-handed male with history of CAD maintained on low-dose aspirin hyperlipidemia hypertension ulcerative colitis that is in remission.  Per chart review lives with spouse independent prior to admission working full-time.  Presented to [facility] 07/25/2022 with word finding difficulty and gait instability.  Admission chemistries unremarkable except BUN 26 creatinine 1.42 total bilirubin 2.4.  Cranial CT scan showed acute intraparenchymal hemorrhage in the left thalamus measuring 1.8 x 1.4 x 1.2 cm.  No midline shift or mass effect.  CT angiogram of the head showed no aneurysm or vascular malformation.  MRI follow-up no mass or unexpected findings.  Initially on Cleviprex for blood pressure control.  Neurosurgery as well as neurology consulted for follow-up with conservative care.  Echocardiogram ejection fraction 60 to 65% no wall motion abnormalities grade 1  diastolic dysfunction.  Tolerating a regular consistency diet.  Therapy evaluations completed and patient was admitted for a comprehensive rehab program  due to decreased functional mobility.   Hospital Course: Mayo Montreuil Odonoghue was admitted to rehab 07/31/2022 for inpatient therapies to consist of PT, ST and OT at least three hours five days a week. Past admission physiatrist, therapy team and rehab RN have worked together to provide customized collaborative inpatient rehab.  Pertaining to patient's intraparenchymal hemorrhage left thalamus without mass effect remained stable conservative care.  Initially advised Lovenox for DVT prophylaxis patient refused support hose were added.  Aspirin for history of CAD remained on hold due to intraparenchymal hemorrhage.  Pain manager use of a Lidoderm patch.  Blood pressure controlled on Cozaar as well as Coreg follow-up outpatient.  Zetia ongoing for hyperlipidemia.  AKI 1.31-1.38 will need outpatient follow-up.   Blood pressures were monitored on TID basis and stable    Rehab course: During patient's stay in rehab weekly team conferences were held to monitor patient's progress, set goals and discuss barriers to discharge. At admission, patient required minimal guard 170 feet rolling walker minimal guard sit to stand  Physical exam Blood pressure 135/80 pulse 61 temperature 98.5 respirations 18 oxygen saturation is 99% room air Constitutional.  No acute distress HEENT Head.  Normocephalic and atraumatic Eyes.  Pupils round and reactive to light no discharge without nystagmus Neck.  Supple nontender no JVD without thyromegaly Cardiac regular rate and rhythm without any extra sounds or murmur heard Abdomen.  Soft nontender positive bowel sounds without rebound Respiratory effort normal no respiratory distress without wheeze Skin.  Clean dry and intact Neurologic.  Alert oriented follows commands mild dysarthria.  Able to name and repeat.  Fair insight and  awareness. Strength 5 out of 5 in bilateral upper and lower extremities sensation intact DTRs 2+  He/She  has had improvement in activity tolerance, balance, postural control as well as ability to compensate for deficits. He/She has had improvement in functional use RUE/LUE  and RLE/LLE as well as improvement in awareness.  Patient contact-guard for mobility ambulating extended distances.  Navigating stairs.  Gather his belongings for activities of daily living and homemaking.  Speech therapy follow-up with Western aphasia battery test given completed revealed anomic aphasia.  Strength observed in auditory verbal comprehension sequential commands repetition and naming.  Cognitively was alert and oriented x 4 demonstrating understanding of call button and how to use it.  Full family teaching completed plan discharge to home       Disposition: Discharge to home    Diet: Regular  Special Instructions: No driving smoking or alcohol  Continue to hold aspirin until further notice  Medications at discharge 1.  Tylenol as needed 2.  Coreg 3.125 mg p.o. twice daily 3.  Zetia 10 mg p.o. daily 4.Vascepa 2 g p.o. twice daily 5.  Imdur 120 mg p.o. daily 6.  Lidoderm patch changes directed 7.  Cozaar 50 mg p.o. daily 8.  Prilosec 20 mg daily as needed 9.  Vitamin C 1000 mg daily 10.  Vitamin D 2000 units daily 11.  Flonase 2 sprays each nostril daily 12.  Antivert 12.5 mg - 25 mg every 6 hours as needed dizziness 13.  Nitroglycerin as needed  30-35 minutes were spent completing discharge summary and discharge planning  Discharge Instructions     Ambulatory referral to Neurology   Complete by: As directed    An appointment is requested in approximately: 4 weeks intraparenchymal hemorrhage   Ambulatory referral to Occupational Therapy   Complete by: As directed    Eval and treat  Ambulatory referral to Physical Medicine Rehab   Complete by: As directed    Moderate complexity follow-up  1 to 2 weeks IPH   Ambulatory referral to Physical Therapy   Complete by: As directed    Eval and treat   Ambulatory referral to Speech Therapy   Complete by: As directed    Eval and treat        Follow-up Information     Kirsteins, Victorino Sparrow, MD Follow up.   Specialty: Physical Medicine and Rehabilitation Why: Office to call for appointment Contact information: [address] [place] [address] [phone]                 Signed: Mcarthur Rossetti Laketha Leopard 08/07/2022, 5:30 AM

## 2022-08-02 NOTE — Progress Notes (Signed)
Occupational Therapy Session Note  Patient Details  Name: Billy Kim MRN: [id number] Date of Birth: 15-May-1945  Today's Date: 08/02/2022 OT Individual Time: 1123-1207 OT Individual Time Calculation (min): 44 min    Short Term Goals: Week 1:  OT Short Term Goal 1 (Week 1): STG=LTG d/t ELOS  Skilled Therapeutic Interventions/Progress Updates:     Pt received sitting up in recliner presenting to be in good spirits receptive to skilled OT session reporting 0/10 pain- OT offering intermittent rest breaks, repositioning, and therapeutic support to optimize participation in therapy session. Focus this session activity tolerance, FM coordination, and IADL re-training.   Pt sit>stand no AD supervision. Pt completed ~200 ft of functional mobility to therapy gym no AD CGA. Pt noted to have downward gaze and decreased swing on RUE- able to correct with mod verbal cues as Pt would quickly forget and return to holding head down and not swinging RUE. Mild uncoordinated arm swing and occasional LOB present during functional mobility, however Pt able to utilize protect responses to correct balance with CGA.   Educated Pt on pill organizers and importance of taking correct dosage of medications at correct times of day to prevent adverse side effects or potential medical emergency over time. Pt receptive to education and able to verbalize and demonstrate understanding of purpose of pill box organizer. Engaged Pt in simulate medication management activity work on [employer] coordination, functional cognition, and assess Pt's ability to safety manage medications at home. Pt instructed to use labeled Sunday-Saturday pill box with slots for "morning, noon, evening, and bed time" to correctly organize 5/5 medications. Pt able to correctly  verbalize and follow single step directions on medications with 100% accuracy and two step directions with min questioning cues to prevent mistakes and for re-assurance. Pt frequently had to  refer back to directions after reading them to ensure understanding presenting with mild STM recall deficits, however demonstrating good use of compensatory strategy to increase safety and accuracy. Following activity, discussed Pt's confidence in completing task with Pt demonstrating insight into deficits and receptive to having with wife assist him with ensuring medications are correctly organized and taken on schedule. During task, Pt able to manipulate small "pills" and pill boxes with min dropping using RUE at non-dominant level.   Pt completed functional mobility back to his room with CGA- able to recall need to lift head and swing R arm during ambulation this trial without cueing. Pt was left resting in recliner with call bell in reach, seat belt alarm on, and all needs met.    Therapy Documentation Precautions:  Precautions Precautions: Fall, Other (comment) Precaution Comments: R inattention, mild R hemi Restrictions Weight Bearing Restrictions: No   Therapy/Group: Individual Therapy  Army Fossa 08/02/2022, 11:42 AM

## 2022-08-03 DIAGNOSIS — I619 Nontraumatic intracerebral hemorrhage, unspecified: Secondary | ICD-10-CM | POA: Diagnosis not present

## 2022-08-03 NOTE — Progress Notes (Signed)
Recreational Therapy Discharge Summary Patient Details  Name: Billy Kim MRN: [id number] Date of Birth: 12-31-1945 Today's Date: 08/03/2022  Comments on progress toward goals: Pt has made excellent progress during LOS and is discharging home with family on 6//18 to provide the needed supervision.  TR sessions focused on pt education, activity analysis/modifications, coping/stress management and community reintegration.  Pt participated in community reintegration to [facility] at overall supervision ambulatory level without assistive device.  Pt did require min cues for right inattention.  Pt is looking forward to upcoming discharge and returning to previously enjoyed activities as able.  See outing goal sheet. GOALS MET.  Reasons for discharge: discharge from hospital  Follow-up: Outpatient  Patient/family agrees with progress made and goals achieved: Yes  Laine Giovanetti 08/03/2022, 12:31 PM

## 2022-08-03 NOTE — Progress Notes (Signed)
Physical Therapy Session Note  Patient Details  Name: Billy Kim MRN: [id number] Date of Birth: [birth year]  Today's Date: 08/03/2022 PT Individual Time: 4098-1191 PT Individual Time Calculation (min): 30 min   Short Term Goals: Week 1:  PT Short Term Goal 1 (Week 1): = to LTGs based on ELOS  Skilled Therapeutic Interventions/Progress Updates: Pt presents supine in bed and agreeable to therapy.  Pt transfers sup to sit w/ mod I.  Pt dons shoes independently at EOB.  Pt transfers throughout session w/ supervision.  Pt amb > 200' to dayroom.  Pt performed standing horseshoe toss w/ alternating arms including crossing midline farther outside of BOS to Right.  Pt standing on [employer] cushion, w/o LOB.  Pt Amb w/ supervision through hallways, given objects to search for to challenge balance.  Pt w/ some word-finding problems (elevator).  Pt returned to room and sat EOB, doffing shoes.  Mod I for sit to supine transfer.  Bed alarm on and all needs in reach.     Therapy Documentation Precautions:  Precautions Precautions: Fall, Other (comment) Precaution Comments: R inattention, mild R hemi Restrictions Weight Bearing Restrictions: No General:   Vital Signs:  Pain:0/10     Therapy/Group: Individual Therapy  Lucio Edward 08/03/2022, 9:48 AM

## 2022-08-03 NOTE — Progress Notes (Signed)
Occupational Therapy Session Note  Patient Details  Name: Billy Kim MRN: [id number] Date of Birth: [birth year]  Today's Date: 08/03/2022 OT Individual Time: 4098-1191 OT Individual Time Calculation (min): 47 min  Second Session:  OT Individual Time: 4782-9562 OT Individual Time Calculation (min): 47 min   Short Term Goals: Week 1:  OT Short Term Goal 1 (Week 1): STG=LTG d/t ELOS  Skilled Therapeutic Interventions/Progress Updates:     Session 1: Pt received semi-reclined in bed presenting to be in good spirits receptive to skilled OT session reporting 0/10 pain- OT offering intermittent rest breaks, repositioning, and therapeutic support to optimize participation in therapy session. Focus this session dynamic standing balance, activity tolerance, and IADL retraining.   Pt requesting to change clothes at beginning of session. Pt transitioned to EOB with supervision. Pt able to doff pants in standing without AD no LOB. Weaved BLEs into pants in sitting by crossing legs into figure-4 position without LOB present.   Pt completed functional mobility to therapy gym without AD requiring min verbal cues for arm swing and head positioning.   Engaged Pt in dynamic standing balance/weight shifting star stepping activity without AD to address reaction time and balance deficits. Pt instructed to follow verbal cues to step R or L foot to colored disk placed on floor (forward/backwards/side ways) weight shifting outside his base of support and then returning back to center. Pt able to complete 2 trials at supervision level overall with occasional LOB noted when stepping posteriorly, however Pt able to correct with min verbal cues.   Dicussed home set-up and safety in bathroom with Pt reporting plans to have his son install grab bars in his home bathroom. With grab bars installed, Pt is safe to complete shower in standing position with supervision. Educated on option of TTB to increase Pt safety, however Pt  reporting he prefers to not use one at this time, but with keep it inconsideration.   Pt ambulated back to room without AD with supervision. Pt requesting to use restroom at end of session. Pt able to stand at toilet for continent void and mange clothing with distant supervision. Pt was left resting in bed with call bell in reach, bed alarm on, and all needs met.    Session 2:  Pt received sitting up in recliner with TR, Misty Stanley, present in room for community integration focused session. Pt presenting to be in good spirits receptive to skilled OT session reporting 0/10 pain- OT offering intermittent rest breaks, repositioning, and therapeutic support to optimize participation in therapy session.  Pt participated in Community reintegration/outing to [facility] Improvement at overall supervision ambulatory level.  Goals focused on safe community mobility, identification & negotiation of obstacles, energy conservation techniques/education, visual scanning & pt education in regard to realistic expectations of himself during future community pursuits.  Pt ambulated throughout the hospital and outdoors on uneven surfaces with supervision, occasional min cues for right inattention.  Pt negotiated van steps with supervision and community mobility throughout the parking lot and [employer]. Engaged Pt in pushing a shopping cart needing supervision/min cues for right visual scanning and determining how much space he needed to safely navigate spaces. Pt able to tolerate ambulating >45 minutes without seated rest break, occasional propped standing rest breaks provided demonstrating increased activity tolerance. Utilized his RUE during tasks at dominant level opening doors, pushing shopping carts, and retrieving items without dropping present. Pt was left resting in recliner with call bell in reach, seat belt alarm  on, and all needs met.    Therapy Documentation Precautions:  Precautions Precautions: Fall,  Other (comment) Precaution Comments: R inattention, mild R hemi Restrictions Weight Bearing Restrictions: No   Therapy/Group: Individual Therapy  Army Fossa 08/03/2022, 8:37 AM

## 2022-08-03 NOTE — Progress Notes (Signed)
Speech Language Pathology Daily Session Note  Patient Details  Name: Domenic Distasio Wroblewski MRN: [id number] Date of Birth: 04-15-1945  Today's Date: 08/03/2022 SLP Individual Time: 1400-1445 SLP Individual Time Calculation (min): 45 min  Short Term Goals: Week 1: SLP Short Term Goal 1 (Week 1): STGs= LTGs due to ELOS  Skilled Therapeutic Interventions:   SLP facilitated a variety of tx tasks targeting specific word finding and complex verbal expression. SLP facilitated complex description and responsive naming task via [employer]. Overall, he benefited from spv verbal cues; though responses of increased complexity (intermittent) required minA semantic verbal cues. In conversation, pt was able to express complex thoughts/ideas @ modI. Speech intelligibility judged to be 100% throughout conversation. Pt left in bed with bed alarm on. Belongings and call light left within pt reach. Recommend cont ST per POC.   Pain Pain Assessment Pain Scale: 0-10 Pain Score: 0-No pain  Therapy/Group: Individual Therapy  Pati Gallo, M.S. CCC-SLP 08/03/2022, 2:14 PM

## 2022-08-03 NOTE — Progress Notes (Signed)
PROGRESS NOTE   Subjective/Complaints:  No issues overnite   ROS- neg CP, SOB, N/V/D  Objective:   No results found. Recent Labs    08/01/22 0607  WBC 6.1  HGB 14.6  HCT 41.4  PLT 116*    Recent Labs    08/01/22 0607  NA 139  K 4.1  CL 107  CO2 25  GLUCOSE 107*  BUN 21  CREATININE 1.38*  CALCIUM 8.4*     Intake/Output Summary (Last 24 hours) at 08/03/2022 1610 Last data filed at 08/02/2022 1249 Gross per 24 hour  Intake 598 ml  Output --  Net 598 ml         Physical Exam: Vital Signs Blood pressure (!) 164/93, pulse (!) 57, temperature 98.1 F (36.7 C), temperature source Oral, resp. rate 17, height 5\' 8"  (1.727 m), weight 80.8 kg, SpO2 100 %.  General: No acute distress Mood and affect are appropriate Heart: Regular rate and rhythm no rubs murmurs or extra sounds Lungs: Clear to auscultation, breathing unlabored, no rales or wheezes Abdomen: Positive bowel sounds, soft nontender to palpation, nondistended Extremities: No clubbing, cyanosis, or edema Skin: No evidence of breakdown, no evidence of rash Neurologic: speech- names ring and watch but not stethoscope ,motor strength is 5/5 in bilateral deltoid, bicep, tricep, grip, hip flexor, knee extensors, ankle dorsiflexor and plantar flexor Sensory exam normal sensation to light touch and proprioception in bilateral upper and lower extremities Cerebellar exam normal finger to nose to finger as well as heel to shin in bilateral upper and lower extremities Musculoskeletal: Full range of motion in all 4 extremities. No joint swelling    Assessment/Plan: 1. Functional deficits which require 3+ hours per day of interdisciplinary therapy in a comprehensive inpatient rehab setting. Physiatrist is providing close team supervision and 24 hour management of active medical problems listed below. Physiatrist and rehab team continue to assess barriers to  discharge/monitor patient progress toward functional and medical goals  Care Tool:  Bathing    Body parts bathed by patient: Right arm, Left arm, Chest, Abdomen, Front perineal area, Buttocks, Right upper leg, Left upper leg, Right lower leg, Left lower leg, Face         Bathing assist Assist Level: Contact Guard/Touching assist     Upper Body Dressing/Undressing Upper body dressing   What is the patient wearing?: Pull over shirt    Upper body assist Assist Level: Supervision/Verbal cueing    Lower Body Dressing/Undressing Lower body dressing      What is the patient wearing?: Pants, Underwear/pull up     Lower body assist Assist for lower body dressing: Contact Guard/Touching assist     Toileting Toileting    Toileting assist Assist for toileting: Contact Guard/Touching assist     Transfers Chair/bed transfer  Transfers assist     Chair/bed transfer assist level: Supervision/Verbal cueing     Locomotion Ambulation   Ambulation assist      Assist level: Supervision/Verbal cueing Assistive device: No Device Max distance: >230ft   Walk 10 feet activity   Assist     Assist level: Supervision/Verbal cueing Assistive device: No Device   Walk 50 feet activity  Assist    Assist level: Supervision/Verbal cueing Assistive device: No Device    Walk 150 feet activity   Assist    Assist level: Supervision/Verbal cueing Assistive device: No Device    Walk 10 feet on uneven surface  activity   Assist     Assist level: Contact Guard/Touching assist Assistive device: Other (comment) (none)   Wheelchair     Assist Is the patient using a wheelchair?: No             Wheelchair 50 feet with 2 turns activity    Assist            Wheelchair 150 feet activity     Assist          Blood pressure (!) 164/93, pulse (!) 57, temperature 98.1 F (36.7 C), temperature source Oral, resp. rate 17, height 5\' 8"  (1.727  m), weight 80.8 kg, SpO2 100 %.  Medical Problem List and Plan: 1. Functional deficits secondary to intraparenchymal hemorrhage in the left thalamus without mass effect.             -patient may  shower             -ELOS/Goals: 6/18,  Sup with PT/OT/SLP Outing with TR today              -Admit to CIR  2.  Antithrombotics: -DVT/anticoagulation:  Mechanical: Antiembolism stockings, thigh (TED hose) Bilateral lower extremities Patient has declined Lovenox therapy             -antiplatelet therapy: Aspirin currently on hold 3. Pain Management: Lidoderm patch  4. Mood/Behavior/Sleep: Provide emotional support             -antipsychotic agents: N/A 5. Neuropsych/cognition: This patient is able of making decisions on his own behalf. 6. Skin/Wound Care: Routine skin checks 7. Fluids/Electrolytes/Nutrition: Routine in and outs with follow-up chemistries 8.  Hypertension.  Cozaar 50 mg daily, Coreg 3.125 mg twice daily.  Monitor with increased mobility.  Goal SBP < 160 Vitals:   08/02/22 1953 08/03/22 0400  BP: 139/85 (!) 164/93  Pulse: 63 (!) 57  Resp: 18 17  Temp: 98 F (36.7 C) 98.1 F (36.7 C)  SpO2: 98% 100%   Would not increase coreg due to HR in 55-65 range, consider increased losartan if BP remains elevated  9.  Hyperlipidemia.  Zetia/Vascepa 10.  History of CAD.  No chest pain or shortness of breath.  Baby aspirin on hold.  Continue Imdur 120 mg daily 11.  History of ulcerative colitis.  Protonix 40 mg daily. 12. AKI. Improving.     Latest Ref Rng & Units 08/01/2022    6:07 AM 07/30/2022    5:25 AM 07/27/2022    8:33 AM  BMP  Glucose 70 - 99 mg/dL 782  956  213   BUN 8 - 23 mg/dL 21  31  28    Creatinine 0.61 - 1.24 mg/dL 0.86  5.78  4.69   Sodium 135 - 145 mmol/L 139  140  139   Potassium 3.5 - 5.1 mmol/L 4.1  3.9  3.9   Chloride 98 - 111 mmol/L 107  108  107   CO2 22 - 32 mmol/L 25  25  25    Calcium 8.9 - 10.3 mg/dL 8.4  8.4  8.8        LOS: 3 days A FACE TO FACE  EVALUATION WAS PERFORMED  Billy Kim Billy Kim 08/03/2022, 8:08 AM

## 2022-08-03 NOTE — Progress Notes (Signed)
Recreational Therapy Session Note  Patient Details  Name: Billy Kim MRN: [id number] Date of Birth: April 19, 1945 Today's Date: 08/03/2022  Pain: no c/o Skilled Therapeutic Interventions/Progress Updates: Pt participated in Community reintegration/outing to [facility] Improvement at overall supervision ambulatory level.  Goals focused on safe community mobility, identification & negotiation of obstacles, energy conservation techniques/education, visual scanning & pt education in regard to realistic expectations of himself during future community pursuits.  Pt ambulated throughout the hospital and outdoors on uneven surfaces with supervision, occasional min cues for right inattention.  Pt negotiated van steps with supervision and community mobility throughout the parking lot and [employer].  Pt also practiced pushing a shopping cart needing supervision/min cues for right visual scanning and determining how much space he needed to safely navigate spaces.  Goals MET.  See outing goal sheet in shadow chart for full details.   Therapy/Group: [employer]   [name] 08/03/2022, 12:20 PM

## 2022-08-04 DIAGNOSIS — I619 Nontraumatic intracerebral hemorrhage, unspecified: Secondary | ICD-10-CM | POA: Diagnosis not present

## 2022-08-04 DIAGNOSIS — N179 Acute kidney failure, unspecified: Secondary | ICD-10-CM | POA: Diagnosis not present

## 2022-08-04 DIAGNOSIS — I10 Essential (primary) hypertension: Secondary | ICD-10-CM | POA: Diagnosis not present

## 2022-08-04 NOTE — Discharge Summary (Signed)
Physical Therapy Discharge Summary  Patient Details  Name: Billy Kim MRN: [id number] Date of Birth: May 22, 1945  Date of Discharge from PT service:{Time; dates multiple:304500300}  {CHL IP REHAB PT TIME CALCULATION:304800500}   Patient has met {NUMBERS 0-12:18577} of {NUMBERS 0-12:18577} long term goals due to {due NW:2956213}.  Patient to discharge at an ambulatory level {LOA:3049010}.   Patient's care partner is independent to provide the necessary physical and cognitive assistance at discharge.  Reasons goals not met: ***  Recommendation:  Patient will benefit from ongoing skilled PT services in outpatient setting to continue to advance safe functional mobility, address ongoing impairments in R hemiparesis (UE>LE), R attention, higher level dynamic standing balance, higher level dynamic gait training, community integration, and minimize fall risk.  Equipment: No equipment provided, none needed  Reasons for discharge: treatment goals met and discharge from hospital  Patient/family agrees with progress made and goals achieved: Yes  Skilled Therapeutic Interventions/Progress Updates:  Pt ***  PT Discharge Precautions/Restrictions Precautions Precautions: Fall;Other (comment) Precaution Comments: R inattention, mild R hemi (UE>LE) Restrictions Weight Bearing Restrictions: No Pain Pain Assessment Pain Scale: 0-10 Pain Score: 0-No pain Pain Interference Pain Interference Pain Effect on Sleep: 0. Does not apply - I have not had any pain or hurting in the past 5 days Pain Interference with Therapy Activities: 1. Rarely or not at all Pain Interference with Day-to-Day Activities: 1. Rarely or not at all Vision/Perception  Vision - History Ability to See in Adequate Light: 1 Impaired Vision - Assessment Eye Alignment: Within Functional Limits Ocular Range of Motion: Within Functional Limits Tracking/Visual Pursuits: Able to track stimulus in all quads without  difficulty Saccades: Additional eye shifts occurred during testing (only during vertical saccades when looking down; otherwise WNL) Convergence: Within functional limits Perception Perception: Impaired Inattention/Neglect: Does not attend to right side of body;Does not attend to right visual field (mild R inattention) Praxis Praxis: Intact  Cognition   Sensation Sensation Light Touch: Appears Intact Hot/Cold: Not tested Proprioception: Appears Intact Stereognosis: Not tested Coordination Gross Motor Movements are Fluid and Coordinated: Yes (for LEs gross motor functional tasks) Coordination and Movement Description: mild R hemi (UE>LE) but improving Motor  Motor Motor: Other (comment) Motor - Discharge Observations: mild R hemi (UE>LE)  Mobility Bed Mobility Bed Mobility: Supine to Sit;Sit to Supine Supine to Sit: Independent with assistive device Sit to Supine: Independent with assistive device Transfers Transfers: Sit to Stand;Stand to Sit;Stand Pivot Transfers Sit to Stand: Independent Stand to Sit: Independent Stand Pivot Transfers: Independent Transfer (Assistive device): None Locomotion     Trunk/Postural Assessment  Cervical Assessment Cervical Assessment: Exceptions to [facility] (mild forward head) Thoracic Assessment Thoracic Assessment: Exceptions to [facility] (slight rounded shoulders) Lumbar Assessment Lumbar Assessment: Within Functional Limits Postural Control Postural Control: Within Functional Limits [facility] for basic, daily functional mobility tasks)  Balance *** Standardized Balance Assessment Standardized Balance Assessment: Functional Gait Assessment Functional Gait  Assessment Gait assessed : Yes Gait Level Surface: Walks 20 ft in less than 7 sec but greater than 5.5 sec, uses assistive device, slower speed, mild gait deviations, or deviates 6-10 in outside of the 12 in walkway width. Change in Gait Speed: Able to smoothly change walking speed without loss of  balance or gait deviation. Deviate no more than 6 in outside of the 12 in walkway width. Gait with Horizontal Head Turns: Performs head turns smoothly with no change in gait. Deviates no more than 6 in outside 12 in walkway width Gait with Vertical Head  Turns: Performs head turns with no change in gait. Deviates no more than 6 in outside 12 in walkway width. Gait and Pivot Turn: Pivot turns safely in greater than 3 sec and stops with no loss of balance, or pivot turns safely within 3 sec and stops with mild imbalance, requires small steps to catch balance. Step Over Obstacle: Is able to step over one shoe box (4.5 in total height) without changing gait speed. No evidence of imbalance. (continues to have difficulty gauaging the distance from object and knowing when to step) Gait with Narrow Base of Support: Ambulates 7-9 steps. Gait with Eyes Closed: Cannot walk 20 ft without assistance, severe gait deviations or imbalance, deviates greater than 15 in outside 12 in walkway width or will not attempt task. Ambulating Backwards: Walks 20 ft, uses assistive device, slower speed, mild gait deviations, deviates 6-10 in outside 12 in walkway width. Steps: Alternating feet, must use rail. Total Score: 21 Extremity Assessment            Ginny Forth , PT, DPT, NCS, CSRS 08/04/2022, 9:19 AM

## 2022-08-04 NOTE — Progress Notes (Signed)
PROGRESS NOTE   Subjective/Complaints:  No events overnight. Denies pain. Reports regular Bms. Reports slept ok.   ROS- neg CP, SOB, N/V/D, abdominal pain, HA  Objective:   No results found. No results for input(s): "WBC", "HGB", "HCT", "PLT" in the last 72 hours.  No results for input(s): "NA", "K", "CL", "CO2", "GLUCOSE", "BUN", "CREATININE", "CALCIUM" in the last 72 hours.   Intake/Output Summary (Last 24 hours) at 08/04/2022 1954 Last data filed at 08/04/2022 1200 Gross per 24 hour  Intake 236 ml  Output --  Net 236 ml         Physical Exam: Vital Signs Blood pressure (!) 143/80, pulse (!) 58, temperature 97.8 F (36.6 C), resp. rate 16, height 5\' 8"  (1.727 m), weight 80.8 kg, SpO2 98 %.  General: No acute distress, lying in bed, appears comfortable  Mood and affect are appropriate Heart: RRR Lungs: Clear to auscultation, breathing unlabored, no rales or wheezes Abdomen: Positive bowel sounds, soft nontender to palpation, nondistended Extremities: No clubbing, cyanosis, or edema Skin: No evidence of breakdown, no evidence of rash Neurologic: speech- word finding difficulties, delayed speech ,motor strength is 5/5 in bilateral deltoid, bicep, tricep, grip, hip flexor, knee extensors, ankle dorsiflexor and plantar flexor Sensory exam normal sensation to light touch and proprioception in bilateral upper and lower extremities Cerebellar exam normal finger to nose to finger as well as heel to shin in bilateral upper and lower extremities Musculoskeletal: Full range of motion in all 4 extremities. No joint swelling    Assessment/Plan: 1. Functional deficits which require 3+ hours per day of interdisciplinary therapy in a comprehensive inpatient rehab setting. Physiatrist is providing close team supervision and 24 hour management of active medical problems listed below. Physiatrist and rehab team continue to assess  barriers to discharge/monitor patient progress toward functional and medical goals  Care Tool:  Bathing    Body parts bathed by patient: Right arm, Left arm, Chest, Abdomen, Front perineal area, Buttocks, Right upper leg, Left upper leg, Right lower leg, Left lower leg, Face         Bathing assist Assist Level: Contact Guard/Touching assist     Upper Body Dressing/Undressing Upper body dressing   What is the patient wearing?: Pull over shirt    Upper body assist Assist Level: Supervision/Verbal cueing    Lower Body Dressing/Undressing Lower body dressing      What is the patient wearing?: Pants, Underwear/pull up     Lower body assist Assist for lower body dressing: Contact Guard/Touching assist     Toileting Toileting    Toileting assist Assist for toileting: Contact Guard/Touching assist     Transfers Chair/bed transfer  Transfers assist     Chair/bed transfer assist level: Supervision/Verbal cueing     Locomotion Ambulation   Ambulation assist      Assist level: Supervision/Verbal cueing Assistive device: No Device Max distance: 200+   Walk 10 feet activity   Assist     Assist level: Supervision/Verbal cueing Assistive device: No Device   Walk 50 feet activity   Assist    Assist level: Supervision/Verbal cueing Assistive device: No Device    Walk 150 feet activity  Assist    Assist level: Supervision/Verbal cueing Assistive device: No Device    Walk 10 feet on uneven surface  activity   Assist     Assist level: Contact Guard/Touching assist Assistive device: Other (comment) (none)   Wheelchair     Assist Is the patient using a wheelchair?: No             Wheelchair 50 feet with 2 turns activity    Assist            Wheelchair 150 feet activity     Assist          Blood pressure (!) 143/80, pulse (!) 58, temperature 97.8 F (36.6 C), resp. rate 16, height 5\' 8"  (1.727 m), weight 80.8  kg, SpO2 98 %.  Medical Problem List and Plan: 1. Functional deficits secondary to intraparenchymal hemorrhage in the left thalamus without mass effect.             -patient may  shower             -ELOS/Goals: 6/18,  Sup with PT/OT/SLP Outing with TR today              -Continue CIR PT/OT/SLP 2.  Antithrombotics: -DVT/anticoagulation:  Mechanical: Antiembolism stockings, thigh (TED hose) Bilateral lower extremities Patient has declined Lovenox therapy             -antiplatelet therapy: Aspirin currently on hold 3. Pain Management: Lidoderm patch  4. Mood/Behavior/Sleep: Provide emotional support             -antipsychotic agents: N/A 5. Neuropsych/cognition: This patient is able of making decisions on his own behalf. 6. Skin/Wound Care: Routine skin checks 7. Fluids/Electrolytes/Nutrition: Routine in and outs with follow-up chemistries 8.  Hypertension.  Cozaar 50 mg daily, Coreg 3.125 mg twice daily.  Monitor with increased mobility.  Goal SBP < 160 Vitals:   08/03/22 2022 08/04/22 0431  BP: (!) 150/91 (!) 143/80  Pulse: 71 (!) 58  Resp: 16 16  Temp: 98.1 F (36.7 C) 97.8 F (36.6 C)  SpO2: 97% 98%   Would not increase coreg due to HR in 55-65 range, consider increased losartan if BP remains elevated  6/15 intermittently elevated, continue to monitor for now  9.  Hyperlipidemia.  Zetia/Vascepa 10.  History of CAD.  No chest pain or shortness of breath.  Baby aspirin on hold.  Continue Imdur 120 mg daily 11.  History of ulcerative colitis.  Protonix 40 mg daily. 12. AKI. Improving.     Latest Ref Rng & Units 08/01/2022    6:07 AM 07/30/2022    5:25 AM 07/27/2022    8:33 AM  BMP  Glucose 70 - 99 mg/dL 660  630  160   BUN 8 - 23 mg/dL 21  31  28    Creatinine 0.61 - 1.24 mg/dL 1.09  3.23  5.57   Sodium 135 - 145 mmol/L 139  140  139   Potassium 3.5 - 5.1 mmol/L 4.1  3.9  3.9   Chloride 98 - 111 mmol/L 107  108  107   CO2 22 - 32 mmol/L 25  25  25    Calcium 8.9 - 10.3 mg/dL  8.4  8.4  8.8    -Encourage fluids, recheck monday    LOS: 4 days A FACE TO FACE EVALUATION WAS PERFORMED  Fanny Dance 08/04/2022, 7:54 PM

## 2022-08-04 NOTE — Progress Notes (Signed)
Occupational Therapy Session Note  Patient Details  Name: Billy Kim MRN: [id number] Date of Birth: 13-Jan-1946  Today's Date: 08/04/2022 OT Individual Time: 1020-1120 OT Individual Time Calculation (min): 60 min    Short Term Goals: Week 1:  OT Short Term Goal 1 (Week 1): STG=LTG d/t ELOS  Skilled Therapeutic Interventions/Progress Updates:    OT session focused on functional mobility, dynamic standing balance, and postural reactions. OT engaged pt in higher level activities incorporating duel tasks. Pt completed figure 8 walk in both directions, progressing to naming colors and numbers of dots in figure 8 pattern (similar to neuromat). Pt with mild LOB during task and able to correct while also showing mild difficulty with duel task component to activity. Pt ambulated throughout unit while locating alphabet on beach ball at supervision level and with increased time. Completed rebounder activity with pt standing on floor and counting by 2's then progressing to completing while standing on balance pad. At end of session, pt returned to room and left with all needs in reach.   Therapy Documentation Precautions:  Precautions Precautions: Fall, Other (comment) Precaution Comments: R inattention, mild R hemi (UE>LE) Restrictions Weight Bearing Restrictions: No General:   Vital Signs:  Pain: Pain Assessment Pain Scale: 0-10 Pain Score: 0-No pain ADL: ADL Eating: Set up Where Assessed-Eating: Chair Grooming: Contact guard Where Assessed-Grooming: Standing at sink Upper Body Bathing: Supervision/safety Where Assessed-Upper Body Bathing: Shower Lower Body Bathing: Contact guard Where Assessed-Lower Body Bathing: Shower Upper Body Dressing: Supervision/safety Where Assessed-Upper Body Dressing: Edge of bed Lower Body Dressing: Contact guard Toileting: Contact guard Where Assessed-Toileting: Teacher, adult education: Furniture conservator/restorer Method: Network engineer: Engineer, technical sales: Not assessed Film/video editor: Administrator, arts Method: Designer, industrial/product: Sales promotion account executive Baseline Vision/History: 1 Wears glasses Patient Visual Report: No change from baseline Vision Assessment?: Yes Eye Alignment: Within Functional Limits Ocular Range of Motion: Within Functional Limits Tracking/Visual Pursuits: Able to track stimulus in all quads without difficulty Saccades: Additional eye shifts occurred during testing (only during vertical saccades when looking down; otherwise WNL) Convergence: Within functional limits Visual Fields: No apparent deficits Depth Perception: Undershoots Perception  Perception: Impaired Inattention/Neglect: Does not attend to right side of body;Does not attend to right visual field (mild R inattention) Praxis Praxis: Intact Balance Standardized Balance Assessment Standardized Balance Assessment: Functional Gait Assessment Functional Gait  Assessment Gait assessed : Yes Gait Level Surface: Walks 20 ft in less than 7 sec but greater than 5.5 sec, uses assistive device, slower speed, mild gait deviations, or deviates 6-10 in outside of the 12 in walkway width. Change in Gait Speed: Able to smoothly change walking speed without loss of balance or gait deviation. Deviate no more than 6 in outside of the 12 in walkway width. Gait with Horizontal Head Turns: Performs head turns smoothly with no change in gait. Deviates no more than 6 in outside 12 in walkway width Gait with Vertical Head Turns: Performs head turns with no change in gait. Deviates no more than 6 in outside 12 in walkway width. Gait and Pivot Turn: Pivot turns safely in greater than 3 sec and stops with no loss of balance, or pivot turns safely within 3 sec and stops with mild imbalance, requires small steps to catch balance. Step Over Obstacle: Is able to step over one shoe box (4.5 in total  height) without changing gait speed. No evidence of imbalance. (continues to have difficulty gauaging  the distance from object and knowing when to step) Gait with Narrow Base of Support: Ambulates 7-9 steps. Gait with Eyes Closed: Cannot walk 20 ft without assistance, severe gait deviations or imbalance, deviates greater than 15 in outside 12 in walkway width or will not attempt task. Ambulating Backwards: Walks 20 ft, uses assistive device, slower speed, mild gait deviations, deviates 6-10 in outside 12 in walkway width. Steps: Alternating feet, must use rail. Total Score: 21 Exercises:   Other Treatments:     Therapy/Group: Individual Therapy  Daneil Dan 08/04/2022, 11:12 AM

## 2022-08-04 NOTE — Progress Notes (Signed)
Speech Language Pathology Daily Session Note  Patient Details  Name: Billy Kim MRN: [id number] Date of Birth: 1945/07/01  Today's Date: 08/04/2022 SLP Individual Time: 1400-1455 SLP Individual Time Calculation (min): 55 min  Short Term Goals: Week 1: SLP Short Term Goal 1 (Week 1): STGs= LTGs due to ELOS  Skilled Therapeutic Interventions: Skilled intervention focused on expressive communication. Pt completed conversational speech task and defined vocabulary words with moderate assistance to include details and use less ambiguous references. He demonstrated functional communication skills and word finding when ordering dinner and breakfast for tonight and tomorrow. Pt provided with education on Semantic feature analysis and strategies to increase word finding and listener understanding in conversation. Cont with therapy per plan of care.      Pain Pain Assessment Pain Scale: Faces Faces Pain Scale: No hurt  Therapy/Group: Individual Therapy  Carlean Jews Jode Lippe 08/04/2022, 2:49 PM

## 2022-08-04 NOTE — Progress Notes (Signed)
Physical Therapy Session Note  Patient Details  Name: Billy Kim MRN: [id number] Date of Birth: Jun 07, 1945  Today's Date: 08/04/2022 PT Individual Time: 0810-0922 PT Individual Time Calculation (min): 72 min   Short Term Goals: Week 1:  PT Short Term Goal 1 (Week 1): = to LTGs based on ELOS  Skilled Therapeutic Interventions/Progress Updates:    Pt received supine in bed awake and agreeable to therapy session. Supine>sitting L EOB, mod-I with HOB partially elevated. Sitting EOB donned tennis shoes without assist. Sit<>stands, no AD, with distant supervision for safety progressing to independent during session.   Gait training ~400-537ft, no AD, with SBA for safety and manually facilitating improved R arm swing. Pt demonstrating the following gait deviations with therapist providing the described cuing and facilitation for improvement:  - pt with decreased R foot clearance landing on forefoot at initial contact resulting in slight forward trunk lean today  - cuing to increase R foot clearance and land with heel strike  - cuing to maintain upright gaze, rather than looking down which worsens forward lean - tactile and verbal cuing with intermittent manual facilitation to increase R arm swing, but unable to sustain   Pt participated in Functional Gait Assessment (FGA) with score of 21/30 demonstrating medium fall risk and no improvement since initial evaluation only 3 days prior (low fall risk 25-28, medium fall risk 19-24, and high fall risk <19).   Pt overall appers to be slightly less balanced this morning, but this improves throughout session - pt reports he may be a little "stiff" in the mornings.  Stair navigation training ascending/descending 12 steps (6" height) not using HRs with CGA progressed to SBA and pt maintaining reciprocal stepping pattern throughout.  Dynamic gait training using agility ladder including the following:  - forward reciprocal stepping - added 4lb ankle weight  to R LE for remainder of agility ladder exercises - forward reciprocal stepping down/back, progressed to adding cognitive dual-task of saying alphabet and then counting up from 10 and then 20 by 2s  - side stepping down/back x2 with focus on increasing speed during 2nd set - backwards step-to pattern leading with R LE progressing to reciprocal stepping pattern down/back x2 reps, demos slow speed of steps in order to maintain balance with CGA Primarily requires close SBA for safety during agility ladder exercises with no significant LOB, except needing CGA during backwards for safety  Dynamic stepping balance and dual-task activity of picking up clothespins from low table on R side then stepping up on 6" step to place clothespin on basketball goal netting and then stepping back down off step to retreive another clothespin - CGA for safety but no LOB.  Gait training back to room and pt demoing improved R LE foot clearance during swing and heel strike on initial contact. At end of session, pt left seated on EOB with needs in reach and bed alarm on.   Therapy Documentation Precautions:  Precautions Precautions: Fall, Other (comment) Precaution Comments: R inattention, mild R hemi Restrictions Weight Bearing Restrictions: No   Pain:  Denies pain during session.  Balance: Standardized Balance Assessment Standardized Balance Assessment: Functional Gait Assessment Functional Gait  Assessment Gait assessed : Yes Gait Level Surface: Walks 20 ft in less than 7 sec but greater than 5.5 sec, uses assistive device, slower speed, mild gait deviations, or deviates 6-10 in outside of the 12 in walkway width. Change in Gait Speed: Able to smoothly change walking speed without loss of balance or gait  deviation. Deviate no more than 6 in outside of the 12 in walkway width. Gait with Horizontal Head Turns: Performs head turns smoothly with no change in gait. Deviates no more than 6 in outside 12 in walkway  width Gait with Vertical Head Turns: Performs head turns with no change in gait. Deviates no more than 6 in outside 12 in walkway width. Gait and Pivot Turn: Pivot turns safely in greater than 3 sec and stops with no loss of balance, or pivot turns safely within 3 sec and stops with mild imbalance, requires small steps to catch balance. Step Over Obstacle: Is able to step over one shoe box (4.5 in total height) without changing gait speed. No evidence of imbalance. (continues to have difficulty gauaging the distance from object and knowing when to step) Gait with Narrow Base of Support: Ambulates 7-9 steps. Gait with Eyes Closed: Cannot walk 20 ft without assistance, severe gait deviations or imbalance, deviates greater than 15 in outside 12 in walkway width or will not attempt task. Ambulating Backwards: Walks 20 ft, uses assistive device, slower speed, mild gait deviations, deviates 6-10 in outside 12 in walkway width. Steps: Alternating feet, must use rail. Total Score: 21    Therapy/Group: Individual Therapy  Ginny Forth , PT, DPT, NCS, CSRS 08/04/2022, 7:48 AM

## 2022-08-05 DIAGNOSIS — I619 Nontraumatic intracerebral hemorrhage, unspecified: Secondary | ICD-10-CM | POA: Diagnosis not present

## 2022-08-05 DIAGNOSIS — N179 Acute kidney failure, unspecified: Secondary | ICD-10-CM | POA: Diagnosis not present

## 2022-08-05 DIAGNOSIS — I10 Essential (primary) hypertension: Secondary | ICD-10-CM | POA: Diagnosis not present

## 2022-08-05 NOTE — Progress Notes (Signed)
PROGRESS NOTE   Subjective/Complaints:  Patient reports he feels "great ".  He denies pain.  Reports he slept well.  Reports he had a bowel movement yesterday.  ROS- neg CP, SOB, N/V/D, abdominal pain, HA, cough  Objective:   No results found. No results for input(s): "WBC", "HGB", "HCT", "PLT" in the last 72 hours.  No results for input(s): "NA", "K", "CL", "CO2", "GLUCOSE", "BUN", "CREATININE", "CALCIUM" in the last 72 hours.   Intake/Output Summary (Last 24 hours) at 08/05/2022 2011 Last data filed at 08/05/2022 0700 Gross per 24 hour  Intake 118 ml  Output --  Net 118 ml         Physical Exam: Vital Signs Blood pressure (!) 134/92, pulse 73, temperature 98.4 F (36.9 C), resp. rate 17, height 5\' 8"  (1.727 m), weight 80.8 kg, SpO2 97 %.  General: No acute distress, lying in bed, appears comfortable  Appropriate, pleasant Heart: RRR Lungs: Clear to auscultation bilaterally, good air movement Abdomen: Positive bowel sounds, soft nontender to palpation, nondistended Extremities: No clubbing, cyanosis, or edema Skin: No evidence of breakdown, no evidence of rash Neurologic: speech- word finding difficulties, delayed speech ,motor strength is 5/5 in bilateral deltoid, bicep, tricep, grip, hip flexor, knee extensors, ankle dorsiflexor and plantar flexor Sensory exam normal sensation to light touch and proprioception in bilateral upper and lower extremities Cerebellar exam normal finger to nose to finger as well as heel to shin in bilateral upper and lower extremities Musculoskeletal: Full range of motion in all 4 extremities. No joint swelling    Assessment/Plan: 1. Functional deficits which require 3+ hours per day of interdisciplinary therapy in a comprehensive inpatient rehab setting. Physiatrist is providing close team supervision and 24 hour management of active medical problems listed below. Physiatrist and  rehab team continue to assess barriers to discharge/monitor patient progress toward functional and medical goals  Care Tool:  Bathing    Body parts bathed by patient: Right arm, Left arm, Chest, Abdomen, Front perineal area, Buttocks, Right upper leg, Left upper leg, Right lower leg, Left lower leg, Face         Bathing assist Assist Level: Contact Guard/Touching assist     Upper Body Dressing/Undressing Upper body dressing   What is the patient wearing?: Pull over shirt    Upper body assist Assist Level: Supervision/Verbal cueing    Lower Body Dressing/Undressing Lower body dressing      What is the patient wearing?: Pants, Underwear/pull up     Lower body assist Assist for lower body dressing: Contact Guard/Touching assist     Toileting Toileting    Toileting assist Assist for toileting: Contact Guard/Touching assist     Transfers Chair/bed transfer  Transfers assist     Chair/bed transfer assist level: Supervision/Verbal cueing     Locomotion Ambulation   Ambulation assist      Assist level: Supervision/Verbal cueing Assistive device: No Device Max distance: 200+   Walk 10 feet activity   Assist     Assist level: Supervision/Verbal cueing Assistive device: No Device   Walk 50 feet activity   Assist    Assist level: Supervision/Verbal cueing Assistive device: No Device  Walk 150 feet activity   Assist    Assist level: Supervision/Verbal cueing Assistive device: No Device    Walk 10 feet on uneven surface  activity   Assist     Assist level: Contact Guard/Touching assist Assistive device: Other (comment) (none)   Wheelchair     Assist Is the patient using a wheelchair?: No             Wheelchair 50 feet with 2 turns activity    Assist            Wheelchair 150 feet activity     Assist          Blood pressure (!) 134/92, pulse 73, temperature 98.4 F (36.9 C), resp. rate 17, height 5'  8" (1.727 m), weight 80.8 kg, SpO2 97 %.  Medical Problem List and Plan: 1. Functional deficits secondary to intraparenchymal hemorrhage in the left thalamus without mass effect.             -patient may  shower             -ELOS/Goals: 6/18,  Sup with PT/OT/SLP             -Continue CIR PT/OT/SLP 2.  Antithrombotics: -DVT/anticoagulation:  Mechanical: Antiembolism stockings, thigh (TED hose) Bilateral lower extremities Patient has declined Lovenox therapy             -antiplatelet therapy: Aspirin currently on hold 3. Pain Management: Lidoderm patch  4. Mood/Behavior/Sleep: Provide emotional support             -antipsychotic agents: N/A 5. Neuropsych/cognition: This patient is able of making decisions on his own behalf. 6. Skin/Wound Care: Routine skin checks 7. Fluids/Electrolytes/Nutrition: Routine in and outs with follow-up chemistries 8.  Hypertension.  Cozaar 50 mg daily, Coreg 3.125 mg twice daily.  Monitor with increased mobility.  Goal SBP < 160 Vitals:   08/05/22 1252 08/05/22 1931  BP: 137/84 (!) 134/92  Pulse: 67 73  Resp: 18 17  Temp: 98.2 F (36.8 C) 98.4 F (36.9 C)  SpO2: 97% 97%   Would not increase coreg due to HR in 55-65 range, consider increased losartan if BP remains elevated  6/16 intermittently a little elevated yesterday although improved today, continue current regimen 9.  Hyperlipidemia.  Zetia/Vascepa 10.  History of CAD.  No chest pain or shortness of breath.  Baby aspirin on hold.  Continue Imdur 120 mg daily 11.  History of ulcerative colitis.  Protonix 40 mg daily. 12. AKI. Improving.     Latest Ref Rng & Units 08/01/2022    6:07 AM 07/30/2022    5:25 AM 07/27/2022    8:33 AM  BMP  Glucose 70 - 99 mg/dL 161  096  045   BUN 8 - 23 mg/dL 21  31  28    Creatinine 0.61 - 1.24 mg/dL 4.09  8.11  9.14   Sodium 135 - 145 mmol/L 139  140  139   Potassium 3.5 - 5.1 mmol/L 4.1  3.9  3.9   Chloride 98 - 111 mmol/L 107  108  107   CO2 22 - 32 mmol/L 25  25   25    Calcium 8.9 - 10.3 mg/dL 8.4  8.4  8.8    -Encourage fluids, recheck tomorrow    LOS: 5 days A FACE TO FACE EVALUATION WAS PERFORMED  Fanny Dance 08/05/2022, 8:11 PM

## 2022-08-05 NOTE — Progress Notes (Signed)
Pt is requesting their social worker to speak with them regarding outpatient therapy on Monday.   Rito Ehrlich, LPN

## 2022-08-06 ENCOUNTER — Other Ambulatory Visit: Payer: PPO

## 2022-08-06 DIAGNOSIS — I619 Nontraumatic intracerebral hemorrhage, unspecified: Secondary | ICD-10-CM | POA: Diagnosis not present

## 2022-08-06 LAB — CBC
HCT: 42.9 % (ref 39.0–52.0)
Hemoglobin: 15 g/dL (ref 13.0–17.0)
MCH: 29.8 pg (ref 26.0–34.0)
MCHC: 35 g/dL (ref 30.0–36.0)
MCV: 85.1 fL (ref 80.0–100.0)
Platelets: 122 10*3/uL — ABNORMAL LOW (ref 150–400)
RBC: 5.04 MIL/uL (ref 4.22–5.81)
RDW: 13.2 % (ref 11.5–15.5)
WBC: 6.2 10*3/uL (ref 4.0–10.5)
nRBC: 0 % (ref 0.0–0.2)

## 2022-08-06 LAB — BASIC METABOLIC PANEL
Anion gap: 16 — ABNORMAL HIGH (ref 5–15)
BUN: 20 mg/dL (ref 8–23)
CO2: 23 mmol/L (ref 22–32)
Calcium: 8.9 mg/dL (ref 8.9–10.3)
Chloride: 104 mmol/L (ref 98–111)
Creatinine, Ser: 1.35 mg/dL — ABNORMAL HIGH (ref 0.61–1.24)
GFR, Estimated: 54 mL/min — ABNORMAL LOW (ref >60)
Glucose, Bld: 99 mg/dL (ref 70–99)
Potassium: 3.9 mmol/L (ref 3.5–5.1)
Sodium: 143 mmol/L (ref 135–145)

## 2022-08-06 MED ORDER — ISOSORBIDE MONONITRATE ER 120 MG PO TB24: 120.0000 mg | ORAL_TABLET | Freq: Every day | ORAL | 0 refills | Status: DC

## 2022-08-06 MED ORDER — LIDOCAINE 5 % EX PTCH: 2.0000 | MEDICATED_PATCH | CUTANEOUS | 0 refills | Status: DC

## 2022-08-06 MED ORDER — CARVEDILOL 3.125 MG PO TABS: 3.1250 mg | ORAL_TABLET | Freq: Two times a day (BID) | ORAL | 0 refills | Status: DC

## 2022-08-06 MED ORDER — ACETAMINOPHEN 325 MG PO TABS: 650.0000 mg | ORAL_TABLET | ORAL | Status: AC | PRN

## 2022-08-06 MED ORDER — VITAMIN D 25 MCG (1000 UNIT) PO TABS: 2000.0000 [IU] | ORAL_TABLET | Freq: Every day | ORAL | 0 refills | Status: AC

## 2022-08-06 MED ORDER — EZETIMIBE 10 MG PO TABS: 10.0000 mg | ORAL_TABLET | Freq: Every day | ORAL | 3 refills | Status: DC

## 2022-08-06 MED ORDER — LOSARTAN POTASSIUM 50 MG PO TABS: 50.0000 mg | ORAL_TABLET | Freq: Every day | ORAL | 3 refills | Status: DC

## 2022-08-06 NOTE — Progress Notes (Signed)
Patient ID: Billy Kim, male   DOB: 1945-02-23, 77 y.o.   MRN: [id number]  This SW covering for primary SW, Lavera Guise.   SW received return phone call from pt wife Billy Kim to discuss outpatient therapy recommendation. She inquired about reasons. SW shared information in PT/OT notes. Prefers [facility]. SW will send referral for PT/OT/SLP.  SW faxed outpatient referral to [facility] (p:[phone]/[phone]).  SW left handicap placard in room.   Cecile Sheerer, MSW, LCSWA Office: [phone] Cell: [phone] Fax: [phone]

## 2022-08-06 NOTE — Progress Notes (Signed)
Inpatient Rehabilitation Discharge Medication Review by a Pharmacist  A complete drug regimen review was completed for this patient to identify any potential clinically significant medication issues.  High Risk Drug Classes Is patient taking? Indication by Medication  Antipsychotic No   Anticoagulant No   Antibiotic No   Opioid No   Antiplatelet No   Hypoglycemics/insulin No   Vasoactive Medication Yes Coreg, imdur, losartan, nitroSL- HTN , angina  Chemotherapy No   Other Yes Zetia- HLD Vascepa- hypertriglyceridemia Flonase- seasonal rhinitis Lidoderm- neuropathic pain Antivert- vertigo Prilosec- GERD     Type of Medication Issue Identified Description of Issue Recommendation(s)  Drug Interaction(s) (clinically significant)     Duplicate Therapy     Allergy     No Medication Administration End Date     Incorrect Dose     Additional Drug Therapy Needed     Significant med changes from prior encounter (inform family/care partners about these prior to discharge).    Other       Clinically significant medication issues were identified that warrant physician communication and completion of prescribed/recommended actions by midnight of the next day:  No  Name of provider notified for urgent issues identified:   Provider Method of Notification:     Pharmacist comments:   Time spent performing this drug regimen review (minutes):  30   Sanders Manninen BS, PharmD, BCPS Clinical Pharmacist 08/06/2022 8:16 AM  Contact: [phone] after 3 PM  "Be curious, not judgmental..." -Debbora Dus

## 2022-08-06 NOTE — Plan of Care (Signed)
  Problem: RH Balance Goal: LTG Patient will maintain dynamic standing with ADLs (OT) Description: LTG:  Patient will maintain dynamic standing balance with assist during activities of daily living (OT)  Outcome: Completed/Met   Problem: Sit to Stand Goal: LTG:  Patient will perform sit to stand in prep for activites of daily living with assistance level (OT) Description: LTG:  Patient will perform sit to stand in prep for activites of daily living with assistance level (OT) Outcome: Completed/Met   Problem: RH Dressing Goal: LTG Patient will perform upper body dressing (OT) Description: LTG Patient will perform upper body dressing with assist, with/without cues (OT). Outcome: Completed/Met Goal: LTG Patient will perform lower body dressing w/assist (OT) Description: LTG: Patient will perform lower body dressing with assist, with/without cues in positioning using equipment (OT) Outcome: Completed/Met   Problem: RH Toileting Goal: LTG Patient will perform toileting task (3/3 steps) with assistance level (OT) Description: LTG: Patient will perform toileting task (3/3 steps) with assistance level (OT)  Outcome: Completed/Met   Problem: RH Functional Use of Upper Extremity Goal: LTG Patient will use RT/LT upper extremity as a (OT) Description: LTG: Patient will use right/left upper extremity as a stabilizer/gross assist/diminished/nondominant/dominant level with assist, with/without cues during functional activity (OT) Outcome: Completed/Met   Problem: RH Simple Meal Prep Goal: LTG Patient will perform simple meal prep w/assist (OT) Description: LTG: Patient will perform simple meal prep with assistance, with/without cues (OT). Outcome: Completed/Met   Problem: RH Light Housekeeping Goal: LTG Patient will perform light housekeeping w/assist (OT) Description: LTG: Patient will perform light housekeeping with assistance, with/without cues (OT). Outcome: Completed/Met   Problem: RH  Toilet Transfers Goal: LTG Patient will perform toilet transfers w/assist (OT) Description: LTG: Patient will perform toilet transfers with assist, with/without cues using equipment (OT) Outcome: Completed/Met   Problem: RH Tub/Shower Transfers Goal: LTG Patient will perform tub/shower transfers w/assist (OT) Description: LTG: Patient will perform tub/shower transfers with assist, with/without cues using equipment (OT) Outcome: Completed/Met   Problem: RH Memory Goal: LTG Patient will demonstrate ability for day to day recall/carry over during activities of daily living with assistance level (OT) Description: LTG:  Patient will demonstrate ability for day to day recall/carry over during activities of daily living with assistance level (OT). Outcome: Completed/Met

## 2022-08-06 NOTE — Progress Notes (Signed)
Speech Language Pathology Discharge Summary  Patient Details  Name: Billy Kim MRN: [id number] Date of Birth: [birth year]  Date of Discharge from SLP service:August 06, 2022  Today's Date: 08/06/2022 SLP Individual Time: 0800-0908 SLP Individual Time Calculation (min): 68 min   Skilled Therapeutic Interventions:  SLP facilitated a variety of speech tasks targeting complex verbal expression.  Pt completed specific word finding tasks (category/letter and adding to abstract category) w/ modA. Call light alarm noted to negatively impact success during tasks and increased success noted when beeping subsided. Pt benefited from minA verbal cues to complete moderately complex responsive naming task and INDly expressed moderately complex thoughts/ideas in conversation @ modI. SLP provided final education re remaining verbal expression deficits and reviewed specific word finding tasks provided for additional practice at home. Pt scheduled for discharge from CIR on 08/07/22. Pt left in bed w/ bed alarm set. Belongings and call light within reach.     Patient has met 4 of 4 long term goals.  Patient to discharge at [facility] level.   Reasons goals not met:   n/a  Clinical Impression/Discharge Summary:   Pt has made excellent progress thus far and has met all LTGs at this time 2* improved specific word finding and complex sentence formulation. He is currently modI for simple verbal expression and minA for complex verbal expression. Pt education completed 6/17, including education re return to prev roles/responsibilities (med/money management and driving) and outpatient ST. Pt will discharge home w/ frequent supervision available from family/friends. He would benefit from outpatient SLP services for more intensive therapy to facilitate improved communication of complex thoughts/ideas and maximize pt independence.   Care Partner:  Caregiver Able to Provide Assistance: Yes  Type of Caregiver Assistance:  Cognitive  Recommendation:  Outpatient SLP  Rationale for SLP Follow Up: Maximize functional communication;Reduce caregiver burden   Equipment:   n/a  Reasons for discharge: Discharged from hospital   Patient/Family Agrees with Progress Made and Goals Achieved: Yes    Pati Gallo, M.S. CCC-SLP 08/06/2022, 9:28 AM

## 2022-08-06 NOTE — Progress Notes (Signed)
Occupational Therapy Discharge Summary  Patient Details  Name: Billy Kim MRN: [id number] Date of Birth: June 10, 1945  Date of Discharge from OT service:August 06, 2022  Today's Date: 08/06/2022 Session 3:  OT Individual Time: 4098-1191 OT Individual Time Calculation (min): 58 min  Session 2: OT Individual Time: 4782-9562 OT Individual Time Calculation (min): 28 min   Patient has met 11 of 11 long term goals due to improved activity tolerance, improved balance, postural control, ability to compensate for deficits, functional use of  RIGHT upper and RIGHT lower extremity, improved awareness, and improved coordination.  Patient to discharge at overall Independent level.  Patient's care partner is independent to provide the necessary physical and cognitive assistance at discharge.    Reasons goals not met: All goals met  Recommendation:  Patient will benefit from ongoing skilled OT services in outpatient setting to continue to advance functional skills in the area of BADL, iADL, Vocation, and Reduce care partner burden.  Equipment: No equipment provided  Reasons for discharge: treatment goals met and discharge from hospital  Patient/family agrees with progress made and goals achieved: Yes  OT Discharge Skilled Therapeutic Interventions/Progress Updates: Session 1:  Pt received sitting up in bed presenting to be in good spirits receptive to skilled OT session reporting 0/10 pain- OT offering intermittent rest breaks, repositioning, and therapeutic support to optimize participation in therapy session. Focus this session d/c planning and BADL retraining. Pt requesting to take shower this AM. Transitioned to EOB mod I with increased time provided. Pt completed short distance functional mobility to bathroom without AD mod I. Pt stood at toilet without AD (continent void in toilet documented in flowsheet) and independently completed anterior peri-care and clothing management. Pt sat on bench  outside shower to doff shirt, socks, and pants independently. Dicussed shower safety and provided education on fall prevention with Pt verbalizing understanding. Pt able to transfer to walk-in shower without AD and independently complete U/LB bathing with increased time provided for safety and motor planning. Pt able to use RUE at independent level this session without dropping items. Pt completed functional mobility back to room without AD and independently completed U/LB bathing tasks. Stood at sink to complete grooming/hygiene tasks (washing face, shaving, grooming hair, brushing teeth) without AD with no LOB or SOB noted. Engaged Pt in functional mobility activity with pt able to ambulate ~300 ft without LOB with Pt able to recall need to keep head lifted and utilize RUE arm swing during mobility. Pt was left resting in his recliner with call bell in reach with all needs met- no alarm on as Pt is independent in his room and safe to ambulate without supervision.  Session 2:  Pt received deeply sleeping in bed waking upon OT arrival presenting to be in good spirits receptive to skilled OT session reporting 0/10 pain- OT offering intermittent rest breaks, repositioning, and therapeutic support to optimize participation in therapy session. Pt completed functional mobility to therapy gym without AD mod I with increased time provided.   Pt completed 9-hole peg test seated at table with practice round provided. Pt previously averaging 31.76 sec on R hand and 25.05 sec on L hand during initial evaluation. Pt with improved scores bilaterally as seen below with improved functional use and coordination for BADL tasks.   R Dominant (hemi) hand Trial 1: 27.64 sec Trial 2: 34.00 sec Trial 3: 28.12 sec Average: 29.92 sec  L Non-dominant hand Trial 1: 23.52 sec Trial 2: 23.80 sec Trial 3: 22.01 sec  Average: 23.12 sec  Pt completed grip strength assessment seated in chair to assess improvement over hospital  stay. Pt with improved strength bilaterally as Pt previously scored 98.87 lbs on his R dominant hand improving to 102.67 lbs and 108 lbs on his L hand improving to 116.3 lbs.   Grip strength:  R hand:  Trial 1: 105 lbs Trail 2: 103 lbs Trail 3: 100 lbs Average: 102.67 lbs L hand:  Trial 1: 120 lbs Trail 2: 119 lbs Trail 3: 110 lbs Average: 116.33 lbs  Pt completed functional mobility back to his room no AD mod I. Pt was left resting in bed with call bell in reach and all needs met.    Precautions/Restrictions  Precautions Precaution Comments: Very mild r innatention, mild R hemi UE>LE Restrictions Weight Bearing Restrictions: No General   Vital Signs Therapy Vitals Temp: 98 F (36.7 C) Pulse Rate: 60 Resp: 17 BP: (Abnormal) 147/87 Patient Position (if appropriate): Lying Oxygen Therapy SpO2: 98 % O2 Device: Room Air Pain Pain Assessment Pain Scale: 0-10 Pain Score: 0-No pain ADL ADL Eating: Modified independent Where Assessed-Eating: Edge of bed Grooming: Modified independent Where Assessed-Grooming: Standing at sink Upper Body Bathing: Modified independent Where Assessed-Upper Body Bathing: Shower Lower Body Bathing: Modified independent Where Assessed-Lower Body Bathing: Shower Upper Body Dressing: Modified independent (Device) Where Assessed-Upper Body Dressing: Edge of bed Lower Body Dressing: Modified independent Where Assessed-Lower Body Dressing: Edge of bed Toileting: Modified independent Where Assessed-Toileting: Teacher, adult education: Engineer, agricultural Method: Proofreader: Engineer, technical sales: Modified independent Web designer Method: Event organiser: Modified independent Film/video editor Method: Designer, industrial/product: Grab bars Vision Baseline Vision/History: 1 Wears glasses Patient Visual Report: No change from baseline Vision Assessment?:  Yes Eye Alignment: Within Functional Limits Ocular Range of Motion: Within Functional Limits Alignment/Gaze Preference: Within Defined Limits Tracking/Visual Pursuits: Able to track stimulus in all quads without difficulty Saccades: Within functional limits Convergence: Within functional limits Visual Fields: No apparent deficits Depth Perception: Undershoots (very mild) Perception  Perception: Impaired Inattention/Neglect: Does not attend to right side of body;Does not attend to right visual field (very mild R innatention) Praxis Praxis: Intact Cognition Cognition Overall Cognitive Status: Impaired/Different from baseline Arousal/Alertness: Awake/alert Orientation Level: Person;Place;Situation Person: Oriented Place: Oriented Situation: Oriented Memory: Impaired (very mild impairment) Memory Impairment: Decreased short term memory;Decreased recall of new information Decreased Short Term Memory: Verbal basic Attention: Sustained;Focused Focused Attention: Appears intact Sustained Attention: Appears intact Awareness: Appears intact Problem Solving: Appears intact Executive Function:  (appears intact for tasks assessed) Safety/Judgment: Appears intact Brief Interview for Mental Status (BIMS) Repetition of Three Words (First Attempt): 3 Temporal Orientation: Year: Correct Temporal Orientation: Month: Accurate within 5 days Temporal Orientation: Day: Correct Recall: "Sock": No, could not recall Recall: "Blue": Yes, no cue required Recall: "Bed": No, could not recall BIMS Summary Score: 11 Sensation Sensation Light Touch: Appears Intact Hot/Cold: Appears Intact Proprioception: Appears Intact Stereognosis: Not tested Coordination Gross Motor Movements are Fluid and Coordinated: Yes Fine Motor Movements are Fluid and Coordinated: No (very mild undershooting with occasional freezing and increased time required for motor planning) Finger Nose Finger Test: mild undershooting  with decreased speed of movements on RUE; improvement from inital evaluation Motor  Motor Motor - Discharge Observations: mild R hemi (UE>LE) Mobility  Bed Mobility Bed Mobility: Supine to Sit;Sit to Supine Supine to Sit: Independent Sit to Supine: Independent Transfers Sit to Stand: Independent Stand to Sit: Independent  Trunk/Postural  Assessment  Cervical Assessment Cervical Assessment: Exceptions to [facility] (mild forward head) Thoracic Assessment Thoracic Assessment: Exceptions to [facility] (slightly rounded shoulders) Lumbar Assessment Lumbar Assessment: Within Functional Limits Postural Control Postural Control: Within Functional Limits [facility] for basic daily functional tasks)  Balance Balance Balance Assessed: Yes Static Sitting Balance Static Sitting - Balance Support: Feet supported Static Sitting - Level of Assistance: 6: Modified independent (Device/Increase time) Dynamic Sitting Balance Dynamic Sitting - Balance Support: During functional activity Dynamic Sitting - Level of Assistance: 6: Modified independent (Device/Increase time) Static Standing Balance Static Standing - Balance Support: During functional activity Static Standing - Level of Assistance: 6: Modified independent (Device/Increase time) Dynamic Standing Balance Dynamic Standing - Balance Support: During functional activity Dynamic Standing - Level of Assistance: 6: Modified independent (Device/Increase time) Extremity/Trunk Assessment RUE Assessment RUE Assessment: Exceptions to [facility] Passive Range of Motion (PROM) Comments: WFL Active Range of Motion (AROM) Comments: WFL General Strength Comments: 4+/5, mildly slowed motor movements with decreased coordination, however improvement from inital evaluation LUE Assessment LUE Assessment: Within Functional Limits   Army Fossa 08/06/2022, 9:51 AM

## 2022-08-06 NOTE — Progress Notes (Signed)
PROGRESS NOTE   Subjective/Complaints:  Patient reports he feels "great ".  He denies pain.  Reports he slept well.  Reports he had a bowel movement yesterday.  ROS- neg CP, SOB, N/V/D, abdominal pain, HA, cough  Objective:   No results found. Recent Labs    08/06/22 0607  WBC 6.2  HGB 15.0  HCT 42.9  PLT 122*    Recent Labs    08/06/22 0607  NA 143  K 3.9  CL 104  CO2 23  GLUCOSE 99  BUN 20  CREATININE 1.35*  CALCIUM 8.9     Intake/Output Summary (Last 24 hours) at 08/06/2022 0852 Last data filed at 08/06/2022 0819 Gross per 24 hour  Intake 355 ml  Output --  Net 355 ml         Physical Exam: Vital Signs Blood pressure (!) 147/87, pulse 60, temperature 98 F (36.7 C), resp. rate 17, height 5\' 8"  (1.727 m), weight 80.8 kg, SpO2 98 %.  General: No acute distress, lying in bed, appears comfortable  Appropriate, pleasant Heart: RRR Lungs: Clear to auscultation bilaterally, good air movement Abdomen: Positive bowel sounds, soft nontender to palpation, nondistended Extremities: No clubbing, cyanosis, or edema Skin: No evidence of breakdown, no evidence of rash Neurologic: speech- word finding difficulties, delayed speech ,motor strength is 5/5 in bilateral deltoid, bicep, tricep, grip, hip flexor, knee extensors, ankle dorsiflexor and plantar flexor Sensory exam normal sensation to light touch and proprioception in bilateral upper and lower extremities Cerebellar exam normal finger to nose to finger as well as heel to shin in bilateral upper and lower extremities Musculoskeletal: Full range of motion in all 4 extremities. No joint swelling    Assessment/Plan: 1. Functional deficits which require 3+ hours per day of interdisciplinary therapy in a comprehensive inpatient rehab setting. Physiatrist is providing close team supervision and 24 hour management of active medical problems listed  below. Physiatrist and rehab team continue to assess barriers to discharge/monitor patient progress toward functional and medical goals  Care Tool:  Bathing    Body parts bathed by patient: Right arm, Left arm, Chest, Abdomen, Front perineal area, Buttocks, Right upper leg, Left upper leg, Right lower leg, Left lower leg, Face         Bathing assist Assist Level: Contact Guard/Touching assist     Upper Body Dressing/Undressing Upper body dressing   What is the patient wearing?: Pull over shirt    Upper body assist Assist Level: Supervision/Verbal cueing    Lower Body Dressing/Undressing Lower body dressing      What is the patient wearing?: Pants, Underwear/pull up     Lower body assist Assist for lower body dressing: Contact Guard/Touching assist     Toileting Toileting    Toileting assist Assist for toileting: Contact Guard/Touching assist     Transfers Chair/bed transfer  Transfers assist     Chair/bed transfer assist level: Supervision/Verbal cueing     Locomotion Ambulation   Ambulation assist      Assist level: Supervision/Verbal cueing Assistive device: No Device Max distance: 200+   Walk 10 feet activity   Assist     Assist level: Supervision/Verbal cueing Assistive device:  No Device   Walk 50 feet activity   Assist    Assist level: Supervision/Verbal cueing Assistive device: No Device    Walk 150 feet activity   Assist    Assist level: Supervision/Verbal cueing Assistive device: No Device    Walk 10 feet on uneven surface  activity   Assist     Assist level: Contact Guard/Touching assist Assistive device: Other (comment) (none)   Wheelchair     Assist Is the patient using a wheelchair?: No             Wheelchair 50 feet with 2 turns activity    Assist            Wheelchair 150 feet activity     Assist          Blood pressure (!) 147/87, pulse 60, temperature 98 F (36.7 C),  resp. rate 17, height 5\' 8"  (1.727 m), weight 80.8 kg, SpO2 98 %.  Medical Problem List and Plan: 1. Functional deficits secondary to intraparenchymal hemorrhage in the left thalamus without mass effect.             -patient may  shower             -ELOS/Goals: 6/18,  Sup with PT/OT/SLP             -Continue CIR PT/OT/SLP May be ready for Mod I in room today , self restricting fluid intake because he does not want to call staff often  2.  Antithrombotics: -DVT/anticoagulation:  Mechanical: Antiembolism stockings, thigh (TED hose) Bilateral lower extremities Patient has declined Lovenox therapy             -antiplatelet therapy: Aspirin currently on hold 3. Pain Management: Lidoderm patch  4. Mood/Behavior/Sleep: Provide emotional support             -antipsychotic agents: N/A 5. Neuropsych/cognition: This patient is able of making decisions on his own behalf. 6. Skin/Wound Care: Routine skin checks 7. Fluids/Electrolytes/Nutrition: Routine in and outs with follow-up chemistries 8.  Hypertension.  Cozaar 50 mg daily, Coreg 3.125 mg twice daily.  Monitor with increased mobility.  Goal SBP < 160 Vitals:   08/05/22 1931 08/06/22 0609  BP: (!) 134/92 (!) 147/87  Pulse: 73 60  Resp: 17 17  Temp: 98.4 F (36.9 C) 98 F (36.7 C)  SpO2: 97% 98%   Would not increase coreg due to HR in 55-65 range, consider increased losartan if BP remains elevated  6/16 intermittently a little elevated yesterday although improved today, continue current regimen 9.  Hyperlipidemia.  Zetia/Vascepa 10.  History of CAD.  No chest pain or shortness of breath.  Baby aspirin on hold.  Continue Imdur 120 mg daily 11.  History of ulcerative colitis.  Protonix 40 mg daily. 12. AKI. Improving.     Latest Ref Rng & Units 08/06/2022    6:07 AM 08/01/2022    6:07 AM 07/30/2022    5:25 AM  BMP  Glucose 70 - 99 mg/dL 99  914  782   BUN 8 - 23 mg/dL 20  21  31    Creatinine 0.61 - 1.24 mg/dL 9.56  2.13  0.86   Sodium 135  - 145 mmol/L 143  139  140   Potassium 3.5 - 5.1 mmol/L 3.9  4.1  3.9   Chloride 98 - 111 mmol/L 104  107  108   CO2 22 - 32 mmol/L 23  25  25    Calcium 8.9 - 10.3  mg/dL 8.9  8.4  8.4    -Creat may be at baseline , BUN improved , creat 82mo ago was 1.19  Has been on Cozaar for >482mo  LOS: 6 days A FACE TO FACE EVALUATION WAS PERFORMED  Erick Colace 08/06/2022, 8:52 AM

## 2022-08-06 NOTE — Telephone Encounter (Signed)
Please let Mr. Boleyn family know that I would favor him following up with one of the neurologists in [place], either with [facility] or [facility].  I recommend that he speak with his rehab team to get their suggestions as well.  Yvonne Kendall, MD [facility]

## 2022-08-06 NOTE — Plan of Care (Signed)
  Problem: RH Expression Communication Goal: LTG Patient will express needs/wants via multi-modal(SLP) Description: LTG:  Patient will express needs/wants via multi-modal communication (gestures/written, etc) with cues (SLP) Outcome: Completed/Met Goal: LTG Patient will verbally express basic/complex needs(SLP) Description: LTG:  Patient will verbally express basic/complex needs, wants or ideas with cues  (SLP) Outcome: Completed/Met Goal: LTG Patient will increase word finding of common (SLP) Description: LTG:  Patient will increase word finding of common objects/daily info/abstract thoughts with cues using compensatory strategies (SLP). Outcome: Completed/Met   

## 2022-08-07 DIAGNOSIS — I619 Nontraumatic intracerebral hemorrhage, unspecified: Secondary | ICD-10-CM | POA: Diagnosis not present

## 2022-08-07 NOTE — Telephone Encounter (Signed)
Spoke with the patient's wife and informed her of the providers response as follows:  "Please let Billy Kim family know that I would favor him following up with one of the neurologists in [place], either with [facility] or [facility].  I recommend that he speak with his rehab team to get their suggestions as well.   Yvonne Kendall, MD [facility]"  Patient's spouse stated she had received a list of names from the attending and that she would be following up with one of them.

## 2022-08-07 NOTE — Progress Notes (Signed)
PROGRESS NOTE   Subjective/Complaints:    ROS- neg CP, SOB, N/V/D, abdominal pain, HA, cough  Objective:   No results found. Recent Labs    08/06/22 0607  WBC 6.2  HGB 15.0  HCT 42.9  PLT 122*    Recent Labs    08/06/22 0607  NA 143  K 3.9  CL 104  CO2 23  GLUCOSE 99  BUN 20  CREATININE 1.35*  CALCIUM 8.9     Intake/Output Summary (Last 24 hours) at 08/07/2022 0839 Last data filed at 08/07/2022 0830 Gross per 24 hour  Intake 1299 ml  Output --  Net 1299 ml         Physical Exam: Vital Signs Blood pressure 120/83, pulse (!) 59, temperature 98.2 F (36.8 C), resp. rate 16, height 5\' 8"  (1.727 m), weight 80.8 kg, SpO2 99 %.  General: No acute distress, lying in bed, appears comfortable  Appropriate, pleasant Heart: RRR Lungs: Clear to auscultation bilaterally, good air movement Abdomen: Positive bowel sounds, soft nontender to palpation, nondistended Extremities: No clubbing, cyanosis, or edema Skin: No evidence of breakdown, no evidence of rash Neurologic: speech- word finding difficulties, delayed speech ,motor strength is 5/5 in bilateral deltoid, bicep, tricep, grip, hip flexor, knee extensors, ankle dorsiflexor and plantar flexor    Assessment/Plan: 1. Functional deficits left thalamic bleed Stable for D/C today F/u PCP in 1-2 weeks F/u PM&R 3-4 weeks Neuro 1-2 mo [place] vs [facility] first available  Cardiology Dr End  See D/C summary See D/C instructions  No driving until re evaled in office No return to work until re eval  Care Tool:  Bathing    Body parts bathed by patient: Right arm, Left arm, Chest, Abdomen, Front perineal area, Buttocks, Right upper leg, Left upper leg, Right lower leg, Left lower leg, Face         Bathing assist Assist Level: Independent with assistive device     Upper Body Dressing/Undressing Upper body dressing   What is the patient wearing?: Pull over  shirt    Upper body assist Assist Level: Independent with assistive device    Lower Body Dressing/Undressing Lower body dressing      What is the patient wearing?: Pants, Underwear/pull up     Lower body assist Assist for lower body dressing: Independent with assitive device     Toileting Toileting    Toileting assist Assist for toileting: Independent with assistive device Assistive Device Comment: Inreased time   Transfers Chair/bed transfer  Transfers assist     Chair/bed transfer assist level: Independent     Locomotion Ambulation   Ambulation assist      Assist level: Independent Assistive device: No Device Max distance: 537ft   Walk 10 feet activity   Assist     Assist level: Independent Assistive device: No Device   Walk 50 feet activity   Assist    Assist level: Independent Assistive device: No Device    Walk 150 feet activity   Assist    Assist level: Independent Assistive device: No Device    Walk 10 feet on uneven surface  activity   Assist     Assist level: Independent  Assistive device: Other (comment) (none)   Wheelchair     Assist Is the patient using a wheelchair?: No   Wheelchair activity did not occur: N/A         Wheelchair 50 feet with 2 turns activity    Assist    Wheelchair 50 feet with 2 turns activity did not occur: N/A       Wheelchair 150 feet activity     Assist  Wheelchair 150 feet activity did not occur: N/A       Blood pressure 120/83, pulse (!) 59, temperature 98.2 F (36.8 C), resp. rate 16, height 5\' 8"  (1.727 m), weight 80.8 kg, SpO2 99 %.  Medical Problem List and Plan: 1. Functional deficits secondary to intraparenchymal hemorrhage in the left thalamus without mass effect.             d/c today   2.  Antithrombotics: -DVT/anticoagulation:  Mechanical: Antiembolism stockings, thigh (TED hose) Bilateral lower extremities Patient has declined Lovenox therapy              -antiplatelet therapy: Aspirin currently on hold 3. Pain Management: Lidoderm patch  4. Mood/Behavior/Sleep: Provide emotional support             -antipsychotic agents: N/A 5. Neuropsych/cognition: This patient is able of making decisions on his own behalf. 6. Skin/Wound Care: Routine skin checks 7. Fluids/Electrolytes/Nutrition: Routine in and outs with follow-up chemistries 8.  Hypertension.  Cozaar 50 mg daily, Coreg 3.125 mg twice daily.  Monitor with increased mobility.  Goal SBP < 160 Vitals:   08/06/22 1315 08/07/22 0358  BP: 120/83   Pulse: 63 (!) 59  Resp: 17 16  Temp: 98 F (36.7 C) 98.2 F (36.8 C)  SpO2: 97% 99%   Would not increase coreg due to HR in 55-65 range, consider increased losartan if BP remains elevated  6/16 intermittently a little elevated yesterday although improved today, continue current regimen 9.  Hyperlipidemia.  Zetia/Vascepa 10.  History of CAD.  No chest pain or shortness of breath.  Baby aspirin on hold.  Continue Imdur 120 mg daily 11.  History of ulcerative colitis.  Protonix 40 mg daily. 12. AKI. Improving.     Latest Ref Rng & Units 08/06/2022    6:07 AM 08/01/2022    6:07 AM 07/30/2022    5:25 AM  BMP  Glucose 70 - 99 mg/dL 99  161  096   BUN 8 - 23 mg/dL 20  21  31    Creatinine 0.61 - 1.24 mg/dL 0.45  4.09  8.11   Sodium 135 - 145 mmol/L 143  139  140   Potassium 3.5 - 5.1 mmol/L 3.9  4.1  3.9   Chloride 98 - 111 mmol/L 104  107  108   CO2 22 - 32 mmol/L 23  25  25    Calcium 8.9 - 10.3 mg/dL 8.9  8.4  8.4    -Creat may be at baseline , BUN improved , creat 87mo ago was 1.19  Has been on Cozaar for >81mo  LOS: 7 days A FACE TO FACE EVALUATION WAS PERFORMED  Erick Colace 08/07/2022, 8:39 AM

## 2022-08-08 ENCOUNTER — Telehealth: Payer: Self-pay | Admitting: *Deleted

## 2022-08-08 NOTE — Telephone Encounter (Signed)
Transitional Care call-- [name] Zappulla          Are you/is patient experiencing any problems since coming home? No Are there any questions regarding any aspect of care? Neurology referral Are there any questions regarding medications administration/dosing? Yes Are meds being taken as prescribed? Yes Patient should review meds with caller to confirm Have there been any falls? No Has Home Health been to the house and/or have they contacted you? Outpatient therapy has not contacted.If not, have you tried to contact them?Calling today Can we help you contact them? Are bowels and bladder emptying properly? Yes  Are there any unexpected incontinence issues? If applicable, is patient following bowel/bladder programs? N/A Any fevers, problems with breathing, unexpected pain? No Are there any skin problems or new areas of breakdown? No Has the patient/family member arranged specialty MD follow up (ie cardiology/neurology/renal/surgical/etc)? No Can we help arrange? Does the patient need any other services or support that we can help arrange? No Are caregivers following through as expected in assisting the patient? Yes spouse Has the patient quit smoking, drinking alcohol, or using drugs as recommended? Yes  Appointment time 3:00 arrive time 2:40 and who it is with here [facility] [address]

## 2022-08-10 NOTE — Progress Notes (Signed)
Inpatient Rehabilitation Care Coordinator Discharge Note   Patient Details  Name: Macallister Ashmead Manso MRN: [id number] Date of Birth: September 08, 1945   Discharge location: Home  Length of Stay: n/a  Discharge activity level: sup  Home/community participation: spouse, [name]  Patient response WU:JWJXBJ Literacy - How often do you need to have someone help you when you read instructions, pamphlets, or other written material from your doctor or pharmacy?: Rarely  Patient response YN:WGNFAO Isolation - How often do you feel lonely or isolated from those around you?: Never  Services provided included: SW, Neuropsych, Pharmacy, TR, RN, CM, SLP, OT, PT, MD, RD  Financial Services:  Financial Services Utilized: Private Insurance HTA  Choices offered to/list presented to: patient and spouse  Follow-up services arranged:  Outpatient, DME    Outpatient Servicies: [facility] : none    Patient response to transportation need: Is the patient able to respond to transportation needs?: Yes In the past 12 months, has lack of transportation kept you from medical appointments or from getting medications?: No In the past 12 months, has lack of transportation kept you from meetings, work, or from getting things needed for daily living?: No   Patient/Family verbalized understanding of follow-up arrangements:  Yes  Individual responsible for coordination of the follow-up plan: self or peggy  Confirmed correct DME delivered: Andria Rhein 08/10/2022    Comments (or additional information):    Andria Rhein

## 2022-08-10 NOTE — Progress Notes (Signed)
Inpatient Rehabilitation Care Coordinator Discharge Note   Patient Details  Name: Ayren R Longie MRN: [id number] Date of Birth: 05/10/1945   Discharge location: Home  Length of Stay: n/a  Discharge activity level: sup  Home/community participation: spouse, [name]  Patient response to:Health Literacy - How often do you need to have someone help you when you read instructions, pamphlets, or other written material from your doctor or pharmacy?: Rarely  Patient response to:Social Isolation - How often do you feel lonely or isolated from those around you?: Never  Services provided included: SW, Neuropsych, Pharmacy, TR, RN, CM, SLP, OT, PT, MD, RD  Financial Services:  Financial Services Utilized: Private Insurance HTA  Choices offered to/list presented to: patient and spouse  Follow-up services arranged:  Outpatient, DME    Outpatient Servicies: [facility] : none    Patient response to transportation need: Is the patient able to respond to transportation needs?: Yes In the past 12 months, has lack of transportation kept you from medical appointments or from getting medications?: No In the past 12 months, has lack of transportation kept you from meetings, work, or from getting things needed for daily living?: No   Patient/Family verbalized understanding of follow-up arrangements:  Yes  Individual responsible for coordination of the follow-up plan: self or peggy  Confirmed correct DME delivered: Ebonee Stober J Crist Kruszka 08/10/2022    Comments (or additional information):    Britani Beattie J Koren Sermersheim 

## 2022-08-13 ENCOUNTER — Encounter: Payer: PPO | Admitting: Speech Pathology

## 2022-08-15 ENCOUNTER — Ambulatory Visit: Payer: PPO | Admitting: Physical Therapy

## 2022-08-15 ENCOUNTER — Ambulatory Visit: Payer: PPO | Admitting: Occupational Therapy

## 2022-08-15 ENCOUNTER — Ambulatory Visit: Payer: PPO | Attending: Family Medicine | Admitting: Speech Pathology

## 2022-08-15 ENCOUNTER — Encounter: Payer: Self-pay | Admitting: Physical Therapy

## 2022-08-15 DIAGNOSIS — I619 Nontraumatic intracerebral hemorrhage, unspecified: Secondary | ICD-10-CM | POA: Insufficient documentation

## 2022-08-15 DIAGNOSIS — R278 Other lack of coordination: Secondary | ICD-10-CM | POA: Diagnosis not present

## 2022-08-15 DIAGNOSIS — R4701 Aphasia: Secondary | ICD-10-CM | POA: Diagnosis not present

## 2022-08-15 DIAGNOSIS — R2689 Other abnormalities of gait and mobility: Secondary | ICD-10-CM | POA: Diagnosis not present

## 2022-08-15 DIAGNOSIS — R262 Difficulty in walking, not elsewhere classified: Secondary | ICD-10-CM

## 2022-08-15 DIAGNOSIS — R41841 Cognitive communication deficit: Secondary | ICD-10-CM | POA: Insufficient documentation

## 2022-08-15 DIAGNOSIS — M6281 Muscle weakness (generalized): Secondary | ICD-10-CM | POA: Diagnosis not present

## 2022-08-15 DIAGNOSIS — R2681 Unsteadiness on feet: Secondary | ICD-10-CM | POA: Insufficient documentation

## 2022-08-15 NOTE — Therapy (Deleted)
OUTPATIENT OCCUPATIONAL THERAPY NEURO EVALUATION  Patient Name: Billy Kim MRN: [id number] DOB:1945-05-02, 77 y.o., male Today's Date: 08/15/2022  PCP:  REFERRING PROVIDER: ***  END OF SESSION:   Past Medical History:  Diagnosis Date   Coronary artery disease 01/2020   Moderate proximal LAD disease (not hemodynamically significant) and CTO's of D1 and non-dominant RCA   HLD (hyperlipidemia)    Hypertension    Ulcerative colitis 5/81   Remission for years   Past Surgical History:  Procedure Laterality Date   CARDIAC CATHETERIZATION  03/20/01   Cardiolite EF 55% 02/10/02   CHOLECYSTECTOMY N/A 07/09/2017   Procedure: LAPAROSCOPIC CHOLECYSTECTOMY;  Surgeon: Jimmye Norman, MD;  Location: [facility] OR;  Service: General;  Laterality: N/A;   COLONOSCOPY  multiple   ENDOSCOPIC RETROGRADE CHOLANGIOPANCREATOGRAPHY (ERCP) WITH PROPOFOL N/A 07/08/2017   Procedure: ENDOSCOPIC RETROGRADE CHOLANGIOPANCREATOGRAPHY (ERCP) WITH PROPOFOL;  Surgeon: Lynann Bologna, MD;  Location: [facility] ENDOSCOPY;  Service: Endoscopy;  Laterality: N/A;   INGUINAL HERNIA REPAIR  04/09/06   Bilateral   LAPAROSCOPIC APPENDECTOMY  03/1981   LEFT HEART CATH AND CORONARY ANGIOGRAPHY Left 12/11/2019   Procedure: LEFT HEART CATH AND CORONARY ANGIOGRAPHY;  Surgeon: Yvonne Kendall, MD;  Location: [facility] INVASIVE CV LAB;  Service: Cardiovascular;  Laterality: Left;   REMOVAL OF STONES  07/08/2017   Procedure: REMOVAL OF STONES;  Surgeon: Lynann Bologna, MD;  Location: [facility] ENDOSCOPY;  Service: Endoscopy;;   SPHINCTEROTOMY  07/08/2017   Procedure: Dennison Mascot;  Surgeon: Lynann Bologna, MD;  Location: [facility] ENDOSCOPY;  Service: Endoscopy;;   Patient Active Problem List   Diagnosis Date Noted   Intraparenchymal hemorrhage of brain (HCC) 07/31/2022   AKI (acute kidney injury) (HCC) 07/27/2022   Hypertensive emergency 07/27/2022   Nontraumatic subcortical hemorrhage of left cerebral hemisphere (HCC) 07/25/2022   Weakness 07/20/2022   Leg  swelling 07/20/2022   Dizziness 11/03/2021   Abscess 08/07/2021   Actinic keratosis 08/07/2021   Melanocytic nevi of trunk 08/07/2021   Nevus of back 08/07/2021   Personal history of other malignant neoplasm of skin 08/07/2021   Medicare annual wellness visit, subsequent 11/09/2020   Coronary artery disease of native artery of native heart with stable angina pectoris (HCC) 12/17/2019   Mixed hyperlipidemia 12/17/2019   Accelerating angina (HCC) 12/11/2019   Shingles 03/01/2019   FH: prostate cancer 02/21/2017   Advance care planning 03/31/2014   PSA elevation 03/27/2013   Thrombocytopenia, unspecified (HCC) 03/27/2013   Acute cough 03/12/2013   ED (erectile dysfunction) 12/28/2011   Neoplasm of uncertain behavior of skin 12/27/2010   Exertional chest pain 01/05/2010   Essential hypertension 04/08/2007   HYPERTRIGLYCERIDEMIA 01/08/2007   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  Rationale for Evaluation and Treatment: {HABREHAB:27488}  SUBJECTIVE:   SUBJECTIVE STATEMENT: *** Pt accompanied by: {accompnied:27141}  PERTINENT HISTORY: ***  PRECAUTIONS: {Therapy precautions:24002}  WEIGHT BEARING RESTRICTIONS: {Yes ***/No:24003}  PAIN:  Are you having pain? {OPRCPAIN:27236}  FALLS: Has patient fallen in last 6 months? {fallsyesno:27318}  LIVING ENVIRONMENT: Lives with: {OPRC lives with:25569::"lives with their family"} Lives in: {Lives in:25570} Stairs: {opstairs:27293} Has following equipment at home: {Assistive devices:23999}  PLOF: {PLOF:24004}  PATIENT GOALS: ***  OBJECTIVE:   HAND DOMINANCE: {MISC; OT HAND DOMINANCE:[id number]}  ADLs: Overall ADLs: *** Transfers/ambulation related to ADLs: Eating: *** Grooming: *** UB Dressing: *** LB Dressing: *** Toileting: *** Bathing: *** Tub Shower transfers: *** Equipment:  {equipment:25573}  IADLs:  Shopping: *** Light housekeeping: *** Meal Prep: *** Community mobility: *** Medication management: *** Financial management: *** Handwriting: {OTWRITTENEXPRESSION:25361}  MOBILITY STATUS: {OTMOBILITY:25360}  POSTURE COMMENTS:  {posture:25561} Sitting balance: {sitting balance:25483}  ACTIVITY TOLERANCE: Activity tolerance: ***  FUNCTIONAL OUTCOME MEASURES: {OTFUNCTIONALMEASURES:27238}  UPPER EXTREMITY ROM:    {AROM/PROM:27142} ROM Right eval Left eval  Shoulder flexion    Shoulder abduction    Shoulder adduction    Shoulder extension    Shoulder internal rotation    Shoulder external rotation    Elbow flexion    Elbow extension    Wrist flexion    Wrist extension    Wrist ulnar deviation    Wrist radial deviation    Wrist pronation    Wrist supination    (Blank rows = not tested)  UPPER EXTREMITY MMT:     MMT Right eval Left eval  Shoulder flexion    Shoulder abduction    Shoulder adduction    Shoulder extension    Shoulder internal rotation    Shoulder external rotation    Middle trapezius    Lower trapezius    Elbow flexion    Elbow extension    Wrist flexion    Wrist extension    Wrist ulnar deviation    Wrist radial deviation    Wrist pronation    Wrist supination    (Blank rows = not tested)  HAND FUNCTION: {handfunction:27230}  COORDINATION: {otcoordination:27237}  SENSATION: {sensation:27233}  EDEMA: ***  MUSCLE TONE: {UETONE:25567}  COGNITION: Overall cognitive status: {cognition:24006}  VISION: Subjective report: *** Baseline vision: {OTBASELINEVISION:25363} Visual history: {OTVISUALHISTORY:25364}  VISION ASSESSMENT: {visionassessment:27231}  Patient has difficulty with following activities due to following visual impairments: ***  PERCEPTION: {Perception:25564}  PRAXIS: {Praxis:25565}  OBSERVATIONS: ***   TODAY'S TREATMENT:                                                                                                                               DATE: ***   PATIENT EDUCATION: Education details: *** Person educated: {Person educated:25204} Education method: {Education Method:25205} Education comprehension: {Education Comprehension:25206}  HOME EXERCISE PROGRAM: ***   GOALS: Goals reviewed with patient? {yes/no:20286}  SHORT TERM GOALS: Target date: ***  *** Baseline: Goal status: INITIAL  2.  *** Baseline:  Goal status: INITIAL  3.  *** Baseline:  Goal status: INITIAL  4.  *** Baseline:  Goal status: INITIAL  5.  *** Baseline:  Goal status: INITIAL  6.  *** Baseline:  Goal status: INITIAL  LONG TERM GOALS: Target date: ***  *** Baseline:  Goal status: INITIAL  2.  *** Baseline:  Goal status: INITIAL  3.  *** Baseline:  Goal status: INITIAL  4.  *** Baseline:  Goal status: INITIAL  5.  *** Baseline:  Goal status: INITIAL  6.  *** Baseline:  Goal status: INITIAL  ASSESSMENT:  CLINICAL IMPRESSION: Patient is a *** y.o. *** who was seen today for occupational therapy evaluation for ***.  PERFORMANCE DEFICITS: in functional skills including {OT physical skills:25468}, cognitive skills including {OT cognitive skills:25469}, and psychosocial skills including {OT psychosocial skills:25470}.   IMPAIRMENTS: are limiting patient from {OT performance deficits:25471}.   CO-MORBIDITIES: {Comorbidities:25485} that affects occupational performance. Patient will benefit from skilled OT to address above impairments and improve overall function.  MODIFICATION OR ASSISTANCE TO COMPLETE EVALUATION: {OT modification:25474}  OT OCCUPATIONAL PROFILE AND HISTORY: {OT PROFILE AND HISTORY:25484}  CLINICAL DECISION MAKING: {OT CDM:25475}  REHAB POTENTIAL: {rehabpotential:25112}  EVALUATION COMPLEXITY: {Evaluation complexity:25115}    PLAN:  OT FREQUENCY: {rehab frequency:25116}  OT DURATION: {rehab duration:25117}  PLANNED  INTERVENTIONS: {OT Interventions:25467}  RECOMMENDED OTHER SERVICES: ***  CONSULTED AND AGREED WITH PLAN OF CARE: {ZOX:09604}  PLAN FOR NEXT SESSION: ***   Herma Carson, Student-OT 08/15/2022, 10:09 AM

## 2022-08-15 NOTE — Progress Notes (Signed)
  Physician Documentation Your signature is required to indicate approval of the treatment plan as stated above. By signing this report, you are approving the plan of care. Please sign and either send electronically or print and fax the signed copy to the number below. If you approve with modifications, please indicate those in the space provided. Physician Signature: Crawford Givens Date:__06/26/24 _____ Time:__9:20 PM   Joaquim Nam, MD

## 2022-08-15 NOTE — Therapy (Addendum)
Pt. Arrived for speech therapy and physical therapy evaluations today, however Pt. Left before the start of occupational therapy evaluation. Will plan to perform the initial evaluation at his next visit.   Herma Carson OTS 08/15/2022 10:32 AM  This entire session was performed under the direct supervision and direction of a licensed therapist. I have personally read, edited, and approve of the note as written.   Olegario Messier, MS, OTR/L   08/15/2022

## 2022-08-15 NOTE — Therapy (Signed)
OUTPATIENT PHYSICAL THERAPY NEURO EVALUATION   Patient Name: DIMITRIOUS MICCICHE MRN: [id number] DOB:1945-11-15, 77 y.o., male Today's Date: 08/15/2022   PCP: Joaquim Nam, MD  REFERRING PROVIDER: Joaquim Nam, MD   END OF SESSION:  PT End of Session - 08/15/22 1030     Visit Number 1    Number of Visits 16    Date for PT Re-Evaluation 10/10/22    Progress Note Due on Visit 10    PT Start Time 0930    PT Stop Time 1015    PT Time Calculation (min) 45 min    Equipment Utilized During Treatment Gait belt    Activity Tolerance Patient tolerated treatment well    Behavior During Therapy [facility] for tasks assessed/performed             Past Medical History:  Diagnosis Date   Coronary artery disease 01/2020   Moderate proximal LAD disease (not hemodynamically significant) and CTO's of D1 and non-dominant RCA   HLD (hyperlipidemia)    Hypertension    Ulcerative colitis 5/81   Remission for years   Past Surgical History:  Procedure Laterality Date   CARDIAC CATHETERIZATION  03/20/01   Cardiolite EF 55% 02/10/02   CHOLECYSTECTOMY N/A 07/09/2017   Procedure: LAPAROSCOPIC CHOLECYSTECTOMY;  Surgeon: Jimmye Norman, MD;  Location: [facility] OR;  Service: General;  Laterality: N/A;   COLONOSCOPY  multiple   ENDOSCOPIC RETROGRADE CHOLANGIOPANCREATOGRAPHY (ERCP) WITH PROPOFOL N/A 07/08/2017   Procedure: ENDOSCOPIC RETROGRADE CHOLANGIOPANCREATOGRAPHY (ERCP) WITH PROPOFOL;  Surgeon: Lynann Bologna, MD;  Location: [facility] ENDOSCOPY;  Service: Endoscopy;  Laterality: N/A;   INGUINAL HERNIA REPAIR  04/09/06   Bilateral   LAPAROSCOPIC APPENDECTOMY  03/1981   LEFT HEART CATH AND CORONARY ANGIOGRAPHY Left 12/11/2019   Procedure: LEFT HEART CATH AND CORONARY ANGIOGRAPHY;  Surgeon: Yvonne Kendall, MD;  Location: [facility] INVASIVE CV LAB;  Service: Cardiovascular;  Laterality: Left;   REMOVAL OF STONES  07/08/2017   Procedure: REMOVAL OF STONES;  Surgeon: Lynann Bologna, MD;  Location: [facility] ENDOSCOPY;  Service:  Endoscopy;;   SPHINCTEROTOMY  07/08/2017   Procedure: Dennison Mascot;  Surgeon: Lynann Bologna, MD;  Location: [facility] ENDOSCOPY;  Service: Endoscopy;;   Patient Active Problem List   Diagnosis Date Noted   Intraparenchymal hemorrhage of brain (HCC) 07/31/2022   AKI (acute kidney injury) (HCC) 07/27/2022   Hypertensive emergency 07/27/2022   Nontraumatic subcortical hemorrhage of left cerebral hemisphere (HCC) 07/25/2022   Weakness 07/20/2022   Leg swelling 07/20/2022   Dizziness 11/03/2021   Abscess 08/07/2021   Actinic keratosis 08/07/2021   Melanocytic nevi of trunk 08/07/2021   Nevus of back 08/07/2021   Personal history of other malignant neoplasm of skin 08/07/2021   Medicare annual wellness visit, subsequent 11/09/2020   Coronary artery disease of native artery of native heart with stable angina pectoris (HCC) 12/17/2019   Mixed hyperlipidemia 12/17/2019   Accelerating angina (HCC) 12/11/2019   Shingles 03/01/2019   FH: prostate cancer 02/21/2017   Advance care planning 03/31/2014   PSA elevation 03/27/2013   Thrombocytopenia, unspecified (HCC) 03/27/2013   Acute cough 03/12/2013   ED (erectile dysfunction) 12/28/2011   Neoplasm of uncertain behavior of skin 12/27/2010   Exertional chest pain 01/05/2010   Essential hypertension 04/08/2007   HYPERTRIGLYCERIDEMIA 01/08/2007   GILBERT'S SYNDROME 01/08/2007   ULCERATIVE COLITIS 01/08/2007    ONSET DATE: 07/25/2022  REFERRING DIAG: Intraparenchymal hemorrhage of brain (HCC)   THERAPY DIAG:  Other abnormalities of gait and mobility  Unsteadiness on feet  Difficulty in walking, not elsewhere classified  Rationale for Evaluation and Treatment: Rehabilitation  SUBJECTIVE:                                                                                                                                                                                             SUBJECTIVE STATEMENT:  Pt reports his biggest problems now are  related to doing two things at once primarily with walking, he notes he tends to 'not walk straight' anymore. He would like to potentially get back to his work, but is aware it probably will not be to full capacity like before. 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 On this date, pt presents with expressive communication deficits, R inattention, R hemiparesis, and impaired balance & gait. Pt requires min assist to ambulate and decreased ability to correct gait pattern to reduce fall risk. Pt with R inattention, bumping doorway x 2 despite PT educating throughout session."  Per IPR note leading to discharge on 08/04/2022:  -pt currently amb. 400-500 ft at SBA -previous FGA scoring at 21/30 staging at a medium fall risk -working on dynamic gait/balance and R LE foot clearance    PAIN:  Are you having pain? No  PRECAUTIONS: Fall  WEIGHT BEARING RESTRICTIONS: No  FALLS: Has patient fallen in last 6 months? No  LIVING ENVIRONMENT: Lives with: lives with their spouse Lives in: House/apartment Stairs: Yes: Internal: 14 steps; on right going up and External: 4 steps; on right going up, on left going up, and can reach both Has following equipment at home: None  PLOF: Independent  PATIENT GOALS: to walk straight, work on memory  OBJECTIVE:   DIAGNOSTIC FINDINGS:  CT ANGIOGRAPHY HEAD on 06/05: IMPRESSION: 1. Normal CTA of the head.  No aneurysm or vascular malformation. 2. Unchanged appearance of left thalamic intraparenchymal hematoma, most consistent with hypertensive hemorrhage.   MRI HEAD WITHOUT AND WITH CONTRAST on 06/06: IMPRESSION:  1. No mass or other unexpected finding underlying  the patient's left thalamic hematoma. 2. Chronic small vessel ischemia with chronic microhemorrhages.   COGNITION: Overall cognitive status: Impaired, as it relates slowed cognitive processes.  COORDINATION: WFL   LOWER EXTREMITY MMT:    MMT Right Eval Left Eval  Hip flexion 4 4  Hip extension    Hip abduction  4 4  Hip adduction 4 4  Hip internal rotation    Hip external rotation    Knee flexion 4- 4-  Knee extension 4+ 4+  Ankle dorsiflexion 4- 4-  Ankle plantarflexion    Ankle inversion    Ankle eversion    (Blank rows = not tested)  TRANSFERS: Assistive device utilized: None  Sit to stand: SBA Stand to sit: SBA Floor:  possible assessment 2nd session  STAIRS: Level of Assistance: Modified independence Stair Negotiation Technique: Alternating Pattern  with Single Rail on Right Number of Stairs: 4  Height of Stairs: Standard  Comments: Use of hand rail going up, no use descending but was cautious.  GAIT: Gait pattern:  R sided drift and decreased arm swing- Right Distance walked: 942 Assistive device utilized: None Level of assistance: CGA  FUNCTIONAL TESTS:  Timed up and go (TUG): 8.92 sec 6 minute walk test: 942 feet 10 meter walk test: 12.37 sec,  Functional gait assessment: 26; see details below  [facility] PT Assessment - 08/15/22 0001       Functional Gait  Assessment   Gait assessed  Yes    Gait Level Surface Walks 20 ft in less than 5.5 sec, no assistive devices, good speed, no evidence for imbalance, normal gait pattern, deviates no more than 6 in outside of the 12 in walkway width.    Change in Gait Speed Able to smoothly change walking speed without loss of balance or gait deviation. Deviate no more than 6 in outside of the 12 in walkway width.    Gait with Horizontal Head Turns Performs head turns smoothly with slight change in gait velocity (eg, minor disruption to smooth gait path), deviates 6-10 in outside 12 in walkway width, or uses an  assistive device.    Gait with Vertical Head Turns Performs head turns with no change in gait. Deviates no more than 6 in outside 12 in walkway width.    Gait and Pivot Turn Pivot turns safely within 3 sec and stops quickly with no loss of balance.    Step Over Obstacle Is able to step over 2 stacked shoe boxes taped together (9 in total height) without changing gait speed. No evidence of imbalance.    Gait with Narrow Base of Support Ambulates 7-9 steps.    Gait with Eyes Closed Walks 20 ft, uses assistive device, slower speed, mild gait deviations, deviates 6-10 in outside 12 in walkway width. Ambulates 20 ft in less than 9 sec but greater than 7 sec.    Ambulating Backwards Walks 20 ft, no assistive devices, good speed, no evidence for imbalance, normal gait    Steps Alternating feet, must use rail.    Total Score 26             PATIENT SURVEYS:  FOTO 73  TODAY'S TREATMENT:  DATE: 08/15/22  Today's initial evaluation consisted of the following tests and measures: FOTO, , , TUG, FGA. See associated goals and functional tests sections for further details.   PATIENT EDUCATION: Education details: Educated on instruction for tests and measures, outcome measures, and POC.  Person educated: Patient Education method: Medical illustrator Education comprehension: verbalized understanding  HOME EXERCISE PROGRAM: Address 2nd session  GOALS: Goals reviewed with patient? No  SHORT TERM GOALS: Target date: 09/12/2022      Patient will be independent in home exercise program to improve strength/mobility for better functional independence with ADLs. Baseline: No HEP currently  Goal status: INITIAL     LONG TERM GOALS: Target date: 10/10/2022     1.  Patient will increase FOTO score to equal to or greater than  80   to demonstrate  statistically significant improvement in mobility and quality of life.  Baseline: 73 Goal status: INITIAL   2.  Patient will increase FGA score by > 4 points to demonstrate decreased fall risk during functional activities. Baseline: 26 Goal status: INITIAL    3.   Patient will increase 10 meter walk test to >1.48m/s as to improve gait speed for better community ambulation and to reduce fall risk. Baseline: 12.37 sec, .81 m/s Goal status: INITIAL  4.   Patient will increase six minute walk test distance to >1200 for progression to advanced community ambulator and improve gait ability Baseline: 942 feet Goal status: INITIAL  5.   Patient will successfully navigate a simulated community environment/obstacle course while successfully attending to R side obstacles without cueing from therapist. Baseline: assess 2nd session Goal status: INITIAL 6.   Patient will display an increase of LE functional strength of 4+/5 or better for various workplace tasks by carrying weighted objects greater distances without rest breaks, no LOB or any level of assist form therapist. Baseline:  assess 2nd session Goal status: INITIAL 7.   Patient will show increased safety with floor transfers by showing consistent and proper transfer mechanics at Modified independent assist or better provided by therapist. Baseline: assess 2nd session Goal status: INITIAL   ASSESSMENT:  CLINICAL IMPRESSION: Patient is a 77 y.o. Male who was seen today for physical therapy evaluation and treatment for Intraparenchymal hemorrhage. Pt is currently functioning at levels close to potential baseline but still showing room for improvement. Pt still presenting as a low fall risk AEB functional gait assessment scores. Additionally shows decreased endurance and gait speed evidenced by distance and speed. Goals above adjusted to better assess pt's presenting baseline. Plan to assess some isolated work tasks next session to  further solidify POC. Pt will continue to benefit from skilled physical therapy intervention to address impairments, improve QOL, and attain therapy goals.    OBJECTIVE IMPAIRMENTS: Abnormal gait, decreased balance, decreased cognition, and decreased endurance.   ACTIVITY LIMITATIONS: carrying, lifting, squatting, and locomotion level  PARTICIPATION LIMITATIONS: driving, community activity, and occupation  PERSONAL FACTORS: Age, Past/current experiences, and Time since onset of injury/illness/exacerbation are also affecting patient's functional outcome.   REHAB POTENTIAL: Good  CLINICAL DECISION MAKING: Stable/uncomplicated  EVALUATION COMPLEXITY: Low  PLAN:  PT FREQUENCY: 1-2x/week  PT DURATION: 8 weeks  PLANNED INTERVENTIONS: Therapeutic exercises, Therapeutic activity, Neuromuscular re-education, Balance training, Gait training, Patient/Family education, Self Care, Joint mobilization, Stair training, Cryotherapy, Moist heat, and Re-evaluation  PLAN FOR NEXT SESSION:   Assessment of: dynamic gait while carrying weighted objects, and floor transfers related to proposed goals. Initiate HEP. High level dynamic  balance and gait training.   Nani Gasser, Student-PT 08/15/2022, 4:21 PM  I have read and reviewed the attached note and am in agreement with the documentation provided.     This licensed clinician was present and actively directing care throughout the session at all times. Grier Rocher PT, DPT  Physical Therapist - [facility]  5:08 PM 08/15/22

## 2022-08-15 NOTE — Therapy (Signed)
OUTPATIENT SPEECH LANGUAGE PATHOLOGY  EVALUATION   Patient Name: Billy Kim MRN: [id number] DOB:1945/05/27, 77 y.o., male Today's Date: 08/15/2022  PCP: Crawford Givens, MD REFERRING PROVIDER: Claudette Laws, MD   End of Session - 08/15/22 [id number]     Visit Number 1    Number of Visits 25    Date for SLP Re-Evaluation 11/07/22    Authorization Type [employer]    Progress Note Due on Visit 10    SLP Start Time 0845    SLP Stop Time  0930    SLP Time Calculation (min) 45 min    Activity Tolerance Patient tolerated treatment well             No past medical history on file. The histories are not reviewed yet. Please review them in the "History" navigator section and refresh this SmartLink. Patient Active Problem List   Diagnosis Date Noted   Intraparenchymal hemorrhage of brain (HCC) 07/31/2022   AKI (acute kidney injury) (HCC) 07/27/2022   Hypertensive emergency 07/27/2022   Nontraumatic subcortical hemorrhage of left cerebral hemisphere (HCC) 07/25/2022   Weakness 07/20/2022   Leg swelling 07/20/2022   Dizziness 11/03/2021   Abscess 08/07/2021   Actinic keratosis 08/07/2021   Melanocytic nevi of trunk 08/07/2021   Nevus of back 08/07/2021   Personal history of other malignant neoplasm of skin 08/07/2021   Medicare annual wellness visit, subsequent 11/09/2020   Coronary artery disease of native artery of native heart with stable angina pectoris (HCC) 12/17/2019   Mixed hyperlipidemia 12/17/2019   Accelerating angina (HCC) 12/11/2019   Shingles 03/01/2019   FH: prostate cancer 02/21/2017   Advance care planning 03/31/2014   PSA elevation 03/27/2013   Thrombocytopenia, unspecified (HCC) 03/27/2013   Acute cough 03/12/2013   ED (erectile dysfunction) 12/28/2011   Neoplasm of uncertain behavior of skin 12/27/2010   Exertional chest pain 01/05/2010   Essential hypertension 04/08/2007   HYPERTRIGLYCERIDEMIA 01/08/2007   GILBERT'S SYNDROME 01/08/2007    ULCERATIVE COLITIS 01/08/2007    ONSET DATE: 07/25/2022   REFERRING DIAG: I61.9 (ICD-10-CM) - Intraparenchymal hemorrhage of brain (HCC)   THERAPY DIAG:  Cognitive communication deficit  Aphasia  Intraparenchymal hemorrhage of brain (HCC)  Rationale for Evaluation and Treatment Rehabilitation  SUBJECTIVE:   SUBJECTIVE STATEMENT: Pt is known to this Clinical research associate, pt appeared subdued with flat affect Pt accompanied by: significant other who remained in the lobby  PERTINENT HISTORY and DIAGNOSTIC FINDINGS: Pt is a right handed 77 year old male with past medical history of HLD, HTN, CAD with recent intra parenchymal hemorrhage on 07/25/2022. MRI (07/26/2022) revealed "known acute hemorrhage in the left thalamus measuring up to 2 cm and chronic small vessel ischemia with chronic microhemorrhages."   PAIN:  Are you having pain? No   FALLS: Has patient fallen in last 6 months?  No  LIVING ENVIRONMENT: Lives with: lives with their spouse Lives in: House/apartment  PLOF:  Level of assistance: Independent with ADLs, Independent with IADLs Employment: Full-time employment   PATIENT GOALS   to get better  OBJECTIVE:   COGNITIVE COMMUNICATION: Overall cognitive status: Difficulty to assess due to: possible anomic aphasia Areas of impairment:  Memory: Impaired: Immediate Working Teacher, music term Psychologist, educational function: Impaired: Problem solving, Organization, Planning, and Slow processing Functional communication: Impaired - halting speech, delayed response Functional deficits: Impaired: suspect some word finding deficits  AUDITORY COMPREHENSION: Overall auditory comprehension: Appears intact YES/NO questions: Appears intact Following directions: Appears intact Conversation: Moderately Complex Interfering components: processing speed Effective technique:  extra processing time   READING COMPREHENSION: Intact  EXPRESSION: verbal  VERBAL EXPRESSION: Level of generative/spontaneous  verbalization: sentence Automatic speech: name: impaired, social response: intact, day of week: impaired, and month of year: impaired  Repetition: Appears intact Naming: Confrontation: 51-75% and Divergent: 51-75% Pragmatics: Impaired: abnormal effect, dysprosody, eye contact, and monotone Effective technique: open ended questions, sentence completion, phonemic cues, and articulatory cues Non-verbal means of communication: N/A   WRITTEN EXPRESSION: Dominant hand: right   Written expression: Appears intact  MOTOR SPEECH: Overall motor speech: Appears intact Respiration: diaphragmatic/abdominal breathing Phonation: wet and low vocal intensity Resonance: WFL Articulation: Appears intact Intelligibility: Intelligible Motor planning: Appears intact  ORAL MOTOR EXAMINATION: Facial : WFL Lingual: WFL Velum: WFL Mandible: WFL Cough: WFL Voice: Weak, Wet suspect related to   STANDARDIZED ASSESSMENTS: Addenbrooke's Cognitive Examination - ACE III The Addenbrooke's Cognitive Examination-III (ACE-III) is a brief cognitive test that assesses five cognitive domains. The total score is 100 with higher scores indicating better cognitive functioning. Cut off scores of 88 and 82 are recommended for suspicion of dementia (88 has sensitivity of 1.00 and specificity of 0.96, 82 has sensitivity of 0.93 and specificity of 1.00). American Version C  Attention 14/18  Memory 12/26  Fluency 5/14  Language 19/26  Visuospatial 16/16  TOTAL ACE- III Score 66/100     PATIENT REPORTED OUTCOME MEASURES (PROM): To be completed over the next 3 sessions   TODAY'S TREATMENT:  N/A   PATIENT EDUCATION: Education details: results of this assessment, ST POC Person educated: Patient and Spouse Education method: Explanation Education comprehension: needs further education  HOME EXERCISE PROGRAM:        N/A    GOALS:  Goals reviewed with patient? Yes  SHORT TERM GOALS: Target date: 10  sessions  With Min A, patient will describe visual scenes using 3 or more sentences at 95% accuracy. Baseline: Goal status: INITIAL   2.  Pt will report improved cognitive communication via PROM by 5 points at last ST session    Baseline:  Goal status: INITIAL  3.   With Min A, patient will recall functional verbal information 80% accuracy with use of compensations/strategies (repeats, rephrasing, notetaking, etc).   Baseline:  Goal status: INITIAL  4.  With Min A, patient will generate sentences with 3 or more words in response to a situation at 95% accuracy in order to increase ability to communicate basic wants and needs.    Baseline:  Goal status: INITIAL  LONG TERM GOALS: Target date: 11/07/2022  With Supervision A, patient will participate in complex conversation at 90% accuracy to increase ability to communicate complex thoughts, feelings, and needs.  Baseline:  Goal status: INITIAL  2.   With Supervision A, patient will demonstrate strategies for processing/recall (eg notetaking, paraphrasing) successfully by recalling >90% of details from 15 minutes verbal information.   Baseline:  Goal status: INITIAL  ASSESSMENT:  CLINICAL IMPRESSION:  Patient is a 77 y.o. right handed male who was seen today for cognitive communication evaluation d/t recent left thalamic hemorrhage.  Pt presents with mild to moderate cognitive communication impaired that is likely multifactorial in nature related to word finding deficits, short term memory deficits, slow processing as well as pragmatic differences such as flat affect. Throughout the evaluation pt stated "I just went blank."  OBJECTIVE IMPAIRMENTS include memory, executive functioning, expressive language, and aphasia. These impairments are limiting patient from return to work, managing medications, managing appointments, managing finances, household responsibilities, ADLs/IADLs, and effectively communicating at  home and in  community. Factors affecting potential to achieve goals and functional outcome are  N/A .Marland Kitchen Patient will benefit from skilled SLP services to address above impairments and improve overall function.  REHAB POTENTIAL: Excellent  PLAN: SLP FREQUENCY: 1-2x/week  SLP DURATION: 12 weeks  PLANNED INTERVENTIONS: Language facilitation, Cueing hierachy, Cognitive reorganization, Internal/external aids, Functional tasks, SLP instruction and feedback, Compensatory strategies, and Patient/family education   Daniyal Tabor B. Dreama Saa, M.S., CCC-SLP, Tree surgeon Certified Brain Injury Specialist [facility]  [facility] Services Office [phone] Ascom [phone] Fax [phone]

## 2022-08-20 ENCOUNTER — Telehealth: Payer: Self-pay | Admitting: Family Medicine

## 2022-08-20 NOTE — Telephone Encounter (Signed)
Patient's wife returned call to the office from [place], asked for a call back whenever possible. Please advise, thank you.

## 2022-08-20 NOTE — Telephone Encounter (Signed)
Wife realized call was from June 3rd and nothing further was needed.

## 2022-08-21 ENCOUNTER — Encounter: Payer: PPO | Attending: Physical Medicine & Rehabilitation | Admitting: Physical Medicine & Rehabilitation

## 2022-08-21 ENCOUNTER — Encounter: Payer: Self-pay | Admitting: Family Medicine

## 2022-08-21 ENCOUNTER — Encounter: Payer: Self-pay | Admitting: Physical Medicine & Rehabilitation

## 2022-08-21 ENCOUNTER — Ambulatory Visit (INDEPENDENT_AMBULATORY_CARE_PROVIDER_SITE_OTHER): Payer: PPO | Admitting: Family Medicine

## 2022-08-21 VITALS — BP 145/84 | HR 59 | Ht 68.0 in | Wt 184.0 lb

## 2022-08-21 VITALS — BP 120/68 | HR 61 | Temp 98.0°F | Ht 68.0 in | Wt 183.0 lb

## 2022-08-21 DIAGNOSIS — I619 Nontraumatic intracerebral hemorrhage, unspecified: Secondary | ICD-10-CM | POA: Diagnosis not present

## 2022-08-21 DIAGNOSIS — I63512 Cerebral infarction due to unspecified occlusion or stenosis of left middle cerebral artery: Secondary | ICD-10-CM | POA: Insufficient documentation

## 2022-08-21 DIAGNOSIS — I10 Essential (primary) hypertension: Secondary | ICD-10-CM | POA: Diagnosis not present

## 2022-08-21 LAB — BASIC METABOLIC PANEL
BUN: 12 mg/dL (ref 6–23)
CO2: 29 mEq/L (ref 19–32)
Calcium: 9 mg/dL (ref 8.4–10.5)
Chloride: 106 mEq/L (ref 96–112)
Creatinine, Ser: 1.21 mg/dL (ref 0.40–1.50)
GFR: 57.43 mL/min — ABNORMAL LOW (ref >60.00)
Glucose, Bld: 104 mg/dL — ABNORMAL HIGH (ref 70–99)
Potassium: 4.5 mEq/L (ref 3.5–5.1)
Sodium: 141 mEq/L (ref 135–145)

## 2022-08-21 LAB — CBC WITH DIFFERENTIAL/PLATELET
Basophils Absolute: 0.1 10*3/uL (ref 0.0–0.1)
Basophils Relative: 0.8 % (ref 0.0–3.0)
Eosinophils Absolute: 0.2 10*3/uL (ref 0.0–0.7)
Eosinophils Relative: 3.8 % (ref 0.0–5.0)
HCT: 42.4 % (ref 39.0–52.0)
Hemoglobin: 14.6 g/dL (ref 13.0–17.0)
Lymphocytes Relative: 16.8 % (ref 12.0–46.0)
Lymphs Abs: 1 10*3/uL (ref 0.7–4.0)
MCHC: 34.4 g/dL (ref 30.0–36.0)
MCV: 87.1 fl (ref 78.0–100.0)
Monocytes Absolute: 0.6 10*3/uL (ref 0.1–1.0)
Monocytes Relative: 9.7 % (ref 3.0–12.0)
Neutro Abs: 4.3 10*3/uL (ref 1.4–7.7)
Neutrophils Relative %: 68.9 % (ref 43.0–77.0)
Platelets: 126 10*3/uL — ABNORMAL LOW (ref 150.0–400.0)
RBC: 4.87 Mil/uL (ref 4.22–5.81)
RDW: 14 % (ref 11.5–15.5)
WBC: 6.2 10*3/uL (ref 4.0–10.5)

## 2022-08-21 NOTE — Patient Instructions (Signed)
Start in ~1 month.  Graduated return to driving instructions were provided. It is recommended that the patient first drives with another licensed driver in an empty parking lot. If the patient does well with this, and they can drive on a quiet street with the licensed driver. If the patient does well with this they can drive on a busy street with a licensed driver. If the patient does well with this, the next time out they can go by himself. For the first month after resuming driving, I recommend no nighttime or Interstate driving.

## 2022-08-21 NOTE — Progress Notes (Signed)
Subjective:    Patient ID: Billy Kim, male    DOB: 1946/01/26, 77 y.o.   MRN: [id number] 77 y.o. right-handed male with history of CAD maintained on low-dose aspirin hyperlipidemia hypertension ulcerative colitis that is in remission.  Per chart review lives with spouse independent prior to admission working full-time.  Presented to [facility] 07/25/2022 with word finding difficulty and gait instability.  Admission chemistries unremarkable except BUN 26 creatinine 1.42 total bilirubin 2.4.  Cranial CT scan sho wed acute intraparenchymal hemorrhage in the left thalamus measuring 1.8 x 1.4 x 1.2 cm.  No midline shift or mass effect.  CT angiogram of the head showed no aneurysm or vascular malformation.  MRI follow-up no mass or unexpected findings.  Initially on Cleviprex for blood pressure control.  Neurosurgery as well as neurology consulted for follow-up with conservative care.  Echocardiogram ejection fraction 60 to 65% no wall motion abnormalities grade 1 diastolic dysfunction.  Tolerating a regular consistency diet.  Therapy evaluations completed and patient was admitted for a comprehensive rehab program due to decreased functional mobility.  Admit date: 07/31/2022 Discharge date: 08/07/2022 HPI  77 year old male recovering from left thalamic hemorrhage.  Fortunately he did not have any sensory loss or dysesthetic pain associated with this.  He still has some mild weakness mainly noted as fatigue after prolonged activity such as walking around the block.  He denies any visual issues.  He is independent with all self-care and mobility with the exception of requiring some assistance for showering.  He has just started outpatient PT OT speech with initial evaluations.  His blood pressure at his primary care office was normal.  Elevated at this office.  He is keeping a log at home which he will bring back to his primary care physician and assess whether any further medication adjustments are needed. Pain  Inventory Average Pain 0 Pain Right Now 0 My pain is intermittent and dull  LOCATION OF PAIN  head  BOWEL Number of stools per week: 3 Oral laxative use No   BLADDER Normal    Mobility walk without assistance how many minutes can you walk? 35 ability to climb steps?  yes do you drive?  no  Function retired  Neuro/Psych No problems in this area  Prior Studies Any changes since last visit?  no  Physicians involved in your care Any changes since last visit?  no   Family History  Problem Relation Age of Onset   Cancer Mother        ? GYN, died after hysterectomy   Stroke Father    CAD Father    Hypertension Sister    Prostate cancer Brother    CAD Brother 42       CABG   Bladder Cancer Brother 72       Bladder   Hypertension Brother    Hypertension Brother    Hypertension Brother    Hypertension Brother    Hypertension Brother    Bone cancer Paternal Grandfather    Hypertension Son    Prostate cancer Nephew    Bladder Cancer Nephew    Colon cancer Neg Hx    Esophageal cancer Neg Hx    Rectal cancer Neg Hx    Stomach cancer Neg Hx    Social History   Socioeconomic History   Marital status: Married    Spouse name: Not on file   Number of children: 1   Years of education: Not on file   Highest education level:  Not on file  Occupational History   Occupation: OWNER    Employer: SELF    Comment: [employer] business  Tobacco Use   Smoking status: Never   Smokeless tobacco: Never  Vaping Use   Vaping Use: Never used  Substance and Sexual Activity   Alcohol use: No   Drug use: No   Sexual activity: Never  Other Topics Concern   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   Social Determinants of Health   Financial Resource Strain: Low Risk  (08/04/2020)   Overall Financial Resource Strain (CARDIA)    Difficulty of Paying Living Expenses: Not  hard at all  Food Insecurity: No Food Insecurity (08/04/2020)   Hunger Vital Sign    Worried About Running Out of Food in the Last Year: Never true    Ran Out of Food in the Last Year: Never true  Transportation Needs: No Transportation Needs (08/04/2020)   PRAPARE - Administrator, Civil Service (Medical): No    Lack of Transportation (Non-Medical): No  Physical Activity: Inactive (08/04/2020)   Exercise Vital Sign    Days of Exercise per Week: 0 days    Minutes of Exercise per Session: 0 min  Stress: No Stress Concern Present (08/04/2020)   [employer] of Occupational Health - Occupational Stress Questionnaire    Feeling of Stress : Not at all  Social Connections: Not on file   Past Surgical History:  Procedure Laterality Date   CARDIAC CATHETERIZATION  03/20/01   Cardiolite EF 55% 02/10/02   CHOLECYSTECTOMY N/A 07/09/2017   Procedure: LAPAROSCOPIC CHOLECYSTECTOMY;  Surgeon: Jimmye Norman, MD;  Location: [facility] OR;  Service: General;  Laterality: N/A;   COLONOSCOPY  multiple   ENDOSCOPIC RETROGRADE CHOLANGIOPANCREATOGRAPHY (ERCP) WITH PROPOFOL N/A 07/08/2017   Procedure: ENDOSCOPIC RETROGRADE CHOLANGIOPANCREATOGRAPHY (ERCP) WITH PROPOFOL;  Surgeon: Lynann Bologna, MD;  Location: [facility] ENDOSCOPY;  Service: Endoscopy;  Laterality: N/A;   INGUINAL HERNIA REPAIR  04/09/06   Bilateral   LAPAROSCOPIC APPENDECTOMY  03/1981   LEFT HEART CATH AND CORONARY ANGIOGRAPHY Left 12/11/2019   Procedure: LEFT HEART CATH AND CORONARY ANGIOGRAPHY;  Surgeon: Yvonne Kendall, MD;  Location: [facility] INVASIVE CV LAB;  Service: Cardiovascular;  Laterality: Left;   REMOVAL OF STONES  07/08/2017   Procedure: REMOVAL OF STONES;  Surgeon: Lynann Bologna, MD;  Location: [facility] ENDOSCOPY;  Service: Endoscopy;;   SPHINCTEROTOMY  07/08/2017   Procedure: Dennison Mascot;  Surgeon: Lynann Bologna, MD;  Location: [facility] ENDOSCOPY;  Service: Endoscopy;;   Past Medical History:  Diagnosis Date   Coronary artery disease 01/2020    Moderate proximal LAD disease (not hemodynamically significant) and CTO's of D1 and non-dominant RCA   HLD (hyperlipidemia)    Hypertension    Ulcerative colitis 5/81   Remission for years   Ht 5\' 8"  (1.727 m)   Wt 184 lb (83.5 kg)   BMI 27.98 kg/m   Opioid Risk Score:   Fall Risk Score:  `1  Depression screen [facility] 2/9     08/21/2022    3:25 PM 08/21/2022    8:41 AM 08/08/2021   10:03 AM 08/04/2020    8:30 AM 04/29/2018    2:38 PM 04/25/2017    3:33 PM 04/23/2016    8:39 AM  Depression screen PHQ 2/9  Decreased Interest 1 0 0 0 0 0 0  Down, Depressed, Hopeless 0 0 0 0  0 0 0  PHQ - 2 Score 1 0 0 0 0 0 0  Altered sleeping 0 0 0 0     Tired, decreased energy 2 1 1  0     Change in appetite 0 0 0 0     Feeling bad or failure about yourself  1 1 0 0     Trouble concentrating 2 2 1  0     Moving slowly or fidgety/restless 3 2 0 0     Suicidal thoughts 0 0 0 0     PHQ-9 Score 9 6 2  0     Difficult doing work/chores  Not difficult at all Not difficult at all Not difficult at all       Review of Systems  Neurological:  Positive for headaches.  All other systems reviewed and are negative.     Objective:   Physical Exam Vitals and nursing note reviewed.  Constitutional:      Appearance: He is normal weight.  HENT:     Head: Normocephalic and atraumatic.  Eyes:     Extraocular Movements: Extraocular movements intact.     Conjunctiva/sclera: Conjunctivae normal.     Pupils: Pupils are equal, round, and reactive to light.  Neurological:     Mental Status: He is alert and oriented to person, place, and time.     Comments: Speech with mild anomia.  He is able to speak at phrase length.  Some increased latency response although the wife states that his baseline. Motor strength is 5/5 bilateral deltoid, bicep, tricep, grip, hip flexor, knee extensor, ankle dorsiflexor and plantar flexor Negative straight leg raise bilaterally Ambulates without assistive device no evidence of toe drag  or knee instability he has some difficulty with toe walk although this may be more of an apraxia.  Psychiatric:        Mood and Affect: Mood normal.        Behavior: Behavior normal.    Sensation intact to light touch in the left and right upper and lower limbs.       Assessment & Plan:  #1.  Left thalamic hemorrhage he is approximately 3 to 4 weeks post.  We discussed that he is early in his recovery and we expect a very good recovery.  We discussed the timing for him of recovery for speech is up to a year.  I would expect his physical recovery plateau around 6 months.  I do think he can go back to driving in about 1 month and have provided some graduated return to driving instructions.  He was working prior to his stroke.  Will address this once he completes his outpatient therapy.  He may have to modify his work activities. Graduated return to driving instructions were provided. It is recommended that the patient first drives with another licensed driver in an empty parking lot. If the patient does well with this, and they can drive on a quiet street with the licensed driver. If the patient does well with this they can drive on a busy street with a licensed driver. If the patient does well with this, the next time out they can go by himself. For the first month after resuming driving, I recommend no nighttime or Interstate driving.

## 2022-08-21 NOTE — Progress Notes (Unsigned)
Inpatient f/u.  Thalamic bleed.  He couldn't speak normally, wife took patient to ER.  Admitted.  Balance was affected.  No numbness. Balance is still not back to baseline.  Prev R sided effect noted.  Speech is better but not back to baseline.  Still in outpatient therapy.   His BP cuff is higher than ours- 141/93, then 145/98.  He isn't lightheaded.    Recheck on our cuff 120/62.    Meds, vitals, and allergies reviewed.   ROS: Per HPI unless specifically indicated in ROS section     I need to update cardiology about getting f/u scheduled.  He needs carotid f/u done.  Already had echo done.    CN 2-12 wnl B, S/S wnl x4

## 2022-08-21 NOTE — Patient Instructions (Addendum)
Your BP looks good here.  Go to the lab on the way out.   If you have mychart we'll likely use that to update you.    Take care.  Glad to see you. I will update cardiology about getting f/u scheduled.

## 2022-08-22 ENCOUNTER — Ambulatory Visit: Payer: PPO | Admitting: Physician Assistant

## 2022-08-22 ENCOUNTER — Ambulatory Visit: Payer: PPO | Admitting: Occupational Therapy

## 2022-08-22 ENCOUNTER — Ambulatory Visit: Payer: PPO | Attending: Physician Assistant

## 2022-08-22 DIAGNOSIS — R278 Other lack of coordination: Secondary | ICD-10-CM | POA: Insufficient documentation

## 2022-08-22 DIAGNOSIS — I619 Nontraumatic intracerebral hemorrhage, unspecified: Secondary | ICD-10-CM | POA: Diagnosis not present

## 2022-08-22 DIAGNOSIS — R41841 Cognitive communication deficit: Secondary | ICD-10-CM | POA: Insufficient documentation

## 2022-08-22 DIAGNOSIS — R2689 Other abnormalities of gait and mobility: Secondary | ICD-10-CM | POA: Diagnosis not present

## 2022-08-22 DIAGNOSIS — R4701 Aphasia: Secondary | ICD-10-CM | POA: Diagnosis not present

## 2022-08-22 DIAGNOSIS — M6281 Muscle weakness (generalized): Secondary | ICD-10-CM | POA: Diagnosis not present

## 2022-08-22 DIAGNOSIS — R2681 Unsteadiness on feet: Secondary | ICD-10-CM | POA: Insufficient documentation

## 2022-08-22 DIAGNOSIS — R262 Difficulty in walking, not elsewhere classified: Secondary | ICD-10-CM | POA: Diagnosis not present

## 2022-08-22 NOTE — Assessment & Plan Note (Signed)
History of.  Has made progress in the meantime.  Continue with PT for now.  Blood pressure is controlled on our cuff.  I suspect he has artificially high anion inaccurate readings on his home cuff.  Continue carvedilol Zetia Vascepa isosorbide losartan in the meantime.  I told him I would update cardiology about getting f/u scheduled.  He needs carotid f/u done.  Already had echo done.

## 2022-08-22 NOTE — Therapy (Addendum)
OUTPATIENT OCCUPATIONAL THERAPY NEURO EVALUATION  Patient Name: Billy Kim MRN: [id number] DOB:08/29/1945, 77 y.o., male Today's Date: 08/22/2022  PCP: Joaquim Nam, MD REFERRING PROVIDER: Charlton Amor, PA-C  END OF SESSION:  OT End of Session - 08/22/22 1309     Visit Number 1    Number of Visits 12    Date for OT Re-Evaluation 11/14/22    OT Start Time 0845    OT Stop Time 0930    OT Time Calculation (min) 45 min    Activity Tolerance Patient tolerated treatment well    Behavior During Therapy [facility] for tasks assessed/performed             Past Medical History:  Diagnosis Date   Coronary artery disease 01/2020   Moderate proximal LAD disease (not hemodynamically significant) and CTO's of D1 and non-dominant RCA   HLD (hyperlipidemia)    Hypertension    Ulcerative colitis 5/81   Remission for years   Past Surgical History:  Procedure Laterality Date   CARDIAC CATHETERIZATION  03/20/01   Cardiolite EF 55% 02/10/02   CHOLECYSTECTOMY N/A 07/09/2017   Procedure: LAPAROSCOPIC CHOLECYSTECTOMY;  Surgeon: Jimmye Norman, MD;  Location: [facility] OR;  Service: General;  Laterality: N/A;   COLONOSCOPY  multiple   ENDOSCOPIC RETROGRADE CHOLANGIOPANCREATOGRAPHY (ERCP) WITH PROPOFOL N/A 07/08/2017   Procedure: ENDOSCOPIC RETROGRADE CHOLANGIOPANCREATOGRAPHY (ERCP) WITH PROPOFOL;  Surgeon: Lynann Bologna, MD;  Location: [facility] ENDOSCOPY;  Service: Endoscopy;  Laterality: N/A;   INGUINAL HERNIA REPAIR  04/09/06   Bilateral   LAPAROSCOPIC APPENDECTOMY  03/1981   LEFT HEART CATH AND CORONARY ANGIOGRAPHY Left 12/11/2019   Procedure: LEFT HEART CATH AND CORONARY ANGIOGRAPHY;  Surgeon: Yvonne Kendall, MD;  Location: [facility] INVASIVE CV LAB;  Service: Cardiovascular;  Laterality: Left;   REMOVAL OF STONES  07/08/2017   Procedure: REMOVAL OF STONES;  Surgeon: Lynann Bologna, MD;  Location: [facility] ENDOSCOPY;  Service: Endoscopy;;   SPHINCTEROTOMY  07/08/2017   Procedure: Dennison Mascot;  Surgeon:  Lynann Bologna, MD;  Location: [facility] ENDOSCOPY;  Service: Endoscopy;;   Patient Active Problem List   Diagnosis Date Noted   Intraparenchymal hemorrhage of brain (HCC) 07/31/2022   AKI (acute kidney injury) (HCC) 07/27/2022   Hypertensive emergency 07/27/2022   Nontraumatic subcortical hemorrhage of left cerebral hemisphere (HCC) 07/25/2022   Weakness 07/20/2022   Leg swelling 07/20/2022   Dizziness 11/03/2021   Abscess 08/07/2021   Actinic keratosis 08/07/2021   Melanocytic nevi of trunk 08/07/2021   Nevus of back 08/07/2021   Personal history of other malignant neoplasm of skin 08/07/2021   Medicare annual wellness visit, subsequent 11/09/2020   Coronary artery disease of native artery of native heart with stable angina pectoris (HCC) 12/17/2019   Mixed hyperlipidemia 12/17/2019   Accelerating angina (HCC) 12/11/2019   Shingles 03/01/2019   FH: prostate cancer 02/21/2017   Advance care planning 03/31/2014   PSA elevation 03/27/2013   Thrombocytopenia, unspecified (HCC) 03/27/2013   Acute cough 03/12/2013   ED (erectile dysfunction) 12/28/2011   Neoplasm of uncertain behavior of skin 12/27/2010   Exertional chest pain 01/05/2010   Essential hypertension 04/08/2007   HYPERTRIGLYCERIDEMIA 01/08/2007   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 Reports that his PT session went well today. 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 Pt. Reports he desires to return to work. Pt. Reports that he is having trouble with memory.  Pt accompanied by: self  PERTINENT HISTORY: Pt. Is a 77 y.o. male who has been diagnosed with a Intraparenchymal hemorrhage of the brain (HCC). Other  dx include AKI, hypertension, hyperlipidemia, and history of CAD and ulcerative colitis. Pt. Presents with expressive communication deficits, R inattention, and R hemiparesis and impaired balance.   PRECAUTIONS: No driving WEIGHT BEARING RESTRICTIONS: No  PAIN:  Are you having pain? No  FALLS: Has patient fallen in last 6 months? No  LIVING ENVIRONMENT: Lives with: lives with their spouse Lives in: 2 story home Stairs: Yes: Internal: 14 steps; and External: 4 steps; can reach both Has following equipment at home: None  PLOF: Independent  PATIENT GOALS: Work on improving memory, and balance on the R side  OBJECTIVE:   HAND DOMINANCE: Right  ADLs:  Eating: Independent however holds utensils differently with the right hand, grabs handle with entire hand Grooming: Independent UB Dressing: Independent LB Dressing: Takes longer to put on pants due to balance, independent with tying shoes and putting socks on when sitting down Toileting: Independent Bathing: Tub shower, standing, holds onto wall to keep balance Tub Shower transfers: Independent, hold onto wall Equipment: none  IADLs: Shopping: Does with spouse, pushes cart to help balance,  Light housekeeping: Wife does most of it now, increased time due to being cautious to ensure he does not lose his balance Meal Prep: Wife prepares all meals, able to go to fridge to get out a drink Community mobility: Uses grocery cart to balance Medication management: Wife sets them out each day for him, pt. Takes them independently Financial management: Spouse handles finance Handwriting: 100% legible  MOBILITY STATUS: Independent however often has to hold onto things to ensure balance  POSTURE COMMENTS:  No Significant postural limitations Sitting balance:  WFL  ACTIVITY TOLERANCE: Activity tolerance: WFL  FUNCTIONAL OUTCOME MEASURES: FOTO: Eval: 98 TR:98  (FOTO goal is not indicated at this time)  UPPER EXTREMITY ROM:     Active ROM Right WFL Left WFL  Shoulder flexion    Shoulder abduction    Shoulder adduction    Shoulder extension    Shoulder internal rotation    Shoulder external rotation    Elbow flexion    Elbow extension    Wrist flexion    Wrist extension    Wrist ulnar deviation    Wrist radial deviation    Wrist pronation    Wrist supination    (Blank rows = not tested)  UPPER EXTREMITY MMT:     MMT Right eval Left eval  Shoulder flexion 5/5 5/5  Shoulder abduction 5/5 5/5  Shoulder adduction    Shoulder extension    Shoulder internal rotation    Shoulder external rotation    Middle trapezius    Lower trapezius    Elbow flexion 5/5 5/5  Elbow extension 5/5 5/5  Wrist flexion 5/5 5/5  Wrist extension    Wrist ulnar deviation    Wrist radial deviation    Wrist pronation    Wrist supination    (Blank rows = not tested)  HAND FUNCTION: Grip strength: Right: 87 lbs; Left: 102 lbs, Lateral pinch: Right: 21 lbs, Left: 21 lbs, and 3 point pinch: Right: 17 lbs, Left: 18 lbs  COORDINATION: 9 Hole Peg test: Right: 29 sec; Left: 25 sec  SENSATION: Light touch: WFL  Stereognosis: WFL  EDEMA: None  MUSCLE TONE: None  COGNITION: Overall cognitive status: Impaired memory  VISION: Subjective report: Pt. Reports he has not noticed any vision changes. Baseline vision: Wears glasses all the time Visual history:  None  VISION ASSESSMENT: To be further assessed in functional context  PERCEPTION: TBD  PRAXIS: WFL  TODAY'S TREATMENT:                                                                                                                              DATE: 08/22/22   Evaluation completed, measurements obtained, goals established  PATIENT EDUCATION: Education details: OT Services, POC, Goals Person educated: Patient Education method: Explanation, Demonstration, and Verbal cues Education comprehension: verbalized understanding  HOME EXERCISE PROGRAM: Will  continue ongoing assessment of HEP needs to provide as indicated.   GOALS: Goals reviewed with patient? Yes  SHORT TERM GOALS: Target date: 10/03/2022    Pt. Will be independent with HEP for increasing R hand strength and coordination. Baseline: Currently no HEP Goal status: INITIAL  LONG TERM GOALS: Target date: 11/14/22  Pt. Will increase R grip strength by 5# to be able to securely hold and carry objects for ADL/IADL tasks.  Baseline: Grip strength: Right: 87 lbs; Left: 102 lbs Goal status: INITIAL  2.  Pt. Will improve R FMC skills by 2 seconds of speed to independently manipulate small objects during ADL/IADL tasks.  Baseline: Eval: 9 hole peg test: L: 25 seconds, R: 29 seconds Goal status: INITIAL  3.  Pt. Will demonstrate simulated pill box setup with supervision.  Baseline: Eval: Pts. Wife currently sets out medicine for Pt. To take, Pt. Has pill organizer but does not use it yet. Goal status: INITIAL  4.  Pt. Will independently demonstrate holding utensil with proper form to increase engagement with eating tasks.   Baseline: Pt. Currently uses full grasp to hold utensil while eating rather than  Goal status: INITIAL   ASSESSMENT:  CLINICAL IMPRESSION: Patient is a 77 y.o. male who was seen today for occupational therapy evaluation for Intraparenchymal hemorrhage of brain (HCC). Pt. Presents with R side hand weakness and coordination deficits. Pt. Desires to return to work and to improve overall R hand strength. Pts. FOTO score was 98 with the TR score of 98. Pt. Would benefit from skilled OT services to improve RUE strength,  to improve R hand coordination to efficiently manipulate objects in order to increase  engagement in and independence in ADL/IADL tasks.  PERFORMANCE DEFICITS: in functional skills including ADLs, IADLs, coordination, strength, and UE functional use, cognitive skills including memory, and psychosocial skills including coping strategies, environmental  adaptation, and routines and behaviors.   IMPAIRMENTS: are limiting patient from ADLs, IADLs, and work.   CO-MORBIDITIES: has no other co-morbidities that affects occupational performance. Patient will benefit from skilled OT to address above impairments and improve overall function.  MODIFICATION OR ASSISTANCE TO COMPLETE EVALUATION: No modification of tasks or assist necessary  to complete an evaluation.  OT OCCUPATIONAL PROFILE AND HISTORY: Detailed assessment: Review of records and additional review of physical, cognitive, psychosocial history related to current functional performance.  CLINICAL DECISION MAKING: Moderate - several treatment options, min-mod task modification necessary  REHAB POTENTIAL: Good  EVALUATION COMPLEXITY: Moderate    PLAN:  OT FREQUENCY: 1x/week  OT DURATION: 12 weeks  PLANNED INTERVENTIONS: self care/ADL training, therapeutic exercise, therapeutic activity, neuromuscular re-education, and patient/family education  RECOMMENDED OTHER SERVICES: ST, PT  CONSULTED AND AGREED WITH PLAN OF CARE: Patient  PLAN FOR NEXT SESSION: Initiate Treatment   Herma Carson, Student-OT 08/22/2022, 1:30 PM  This entire session was performed under the direct supervision and direction of a licensed therapist. I have personally read, edited, and approve of the note as written.   Olegario Messier, MS, OTR/L   08/22/2022

## 2022-08-22 NOTE — Therapy (Signed)
OUTPATIENT PHYSICAL THERAPY NEURO TREATMENT  Patient Name: Billy Kim MRN: [id number] DOB:05-27-1945, 77 y.o., male Today's Date: 08/22/2022   PCP: Joaquim Nam, MD  REFERRING PROVIDER: Joaquim Nam, MD   END OF SESSION:  PT End of Session - 08/22/22 0747     Visit Number 2    Number of Visits 16    Date for PT Re-Evaluation 10/10/22    Progress Note Due on Visit 10    PT Start Time 0801    PT Stop Time 0843    PT Time Calculation (min) 42 min    Equipment Utilized During Treatment Gait belt    Activity Tolerance Patient tolerated treatment well    Behavior During Therapy [facility] for tasks assessed/performed             Past Medical History:  Diagnosis Date   Coronary artery disease 01/2020   Moderate proximal LAD disease (not hemodynamically significant) and CTO's of D1 and non-dominant RCA   HLD (hyperlipidemia)    Hypertension    Ulcerative colitis 5/81   Remission for years   Past Surgical History:  Procedure Laterality Date   CARDIAC CATHETERIZATION  03/20/01   Cardiolite EF 55% 02/10/02   CHOLECYSTECTOMY N/A 07/09/2017   Procedure: LAPAROSCOPIC CHOLECYSTECTOMY;  Surgeon: Jimmye Norman, MD;  Location: [facility] OR;  Service: General;  Laterality: N/A;   COLONOSCOPY  multiple   ENDOSCOPIC RETROGRADE CHOLANGIOPANCREATOGRAPHY (ERCP) WITH PROPOFOL N/A 07/08/2017   Procedure: ENDOSCOPIC RETROGRADE CHOLANGIOPANCREATOGRAPHY (ERCP) WITH PROPOFOL;  Surgeon: Lynann Bologna, MD;  Location: [facility] ENDOSCOPY;  Service: Endoscopy;  Laterality: N/A;   INGUINAL HERNIA REPAIR  04/09/06   Bilateral   LAPAROSCOPIC APPENDECTOMY  03/1981   LEFT HEART CATH AND CORONARY ANGIOGRAPHY Left 12/11/2019   Procedure: LEFT HEART CATH AND CORONARY ANGIOGRAPHY;  Surgeon: Yvonne Kendall, MD;  Location: [facility] INVASIVE CV LAB;  Service: Cardiovascular;  Laterality: Left;   REMOVAL OF STONES  07/08/2017   Procedure: REMOVAL OF STONES;  Surgeon: Lynann Bologna, MD;  Location: [facility] ENDOSCOPY;  Service:  Endoscopy;;   SPHINCTEROTOMY  07/08/2017   Procedure: Dennison Mascot;  Surgeon: Lynann Bologna, MD;  Location: [facility] ENDOSCOPY;  Service: Endoscopy;;   Patient Active Problem List   Diagnosis Date Noted   Intraparenchymal hemorrhage of brain (HCC) 07/31/2022   AKI (acute kidney injury) (HCC) 07/27/2022   Hypertensive emergency 07/27/2022   Nontraumatic subcortical hemorrhage of left cerebral hemisphere (HCC) 07/25/2022   Weakness 07/20/2022   Leg swelling 07/20/2022   Dizziness 11/03/2021   Abscess 08/07/2021   Actinic keratosis 08/07/2021   Melanocytic nevi of trunk 08/07/2021   Nevus of back 08/07/2021   Personal history of other malignant neoplasm of skin 08/07/2021   Medicare annual wellness visit, subsequent 11/09/2020   Coronary artery disease of native artery of native heart with stable angina pectoris (HCC) 12/17/2019   Mixed hyperlipidemia 12/17/2019   Accelerating angina (HCC) 12/11/2019   Shingles 03/01/2019   FH: prostate cancer 02/21/2017   Advance care planning 03/31/2014   PSA elevation 03/27/2013   Thrombocytopenia, unspecified (HCC) 03/27/2013   Acute cough 03/12/2013   ED (erectile dysfunction) 12/28/2011   Neoplasm of uncertain behavior of skin 12/27/2010   Exertional chest pain 01/05/2010   Essential hypertension 04/08/2007   HYPERTRIGLYCERIDEMIA 01/08/2007   GILBERT'S SYNDROME 01/08/2007   ULCERATIVE COLITIS 01/08/2007    ONSET DATE: 07/25/2022  REFERRING DIAG: Intraparenchymal hemorrhage of brain (HCC)   THERAPY DIAG:  Muscle weakness (generalized)  Other lack of coordination  Other abnormalities of  gait and mobility  Unsteadiness on feet  Difficulty in walking, not elsewhere classified  Rationale for Evaluation and Treatment: Rehabilitation  SUBJECTIVE:                                                                                                                                                                                              SUBJECTIVE STATEMENT:  Patient reports having issues with veering to right when walking    Pt accompanied by: self  PERTINENT HISTORY: Per acute care PT evaluation on 07/27/2022: "Prior to admission pt was independent, working in [employer], and driving. On this date, pt presents with expressive communication deficits, R inattention, R hemiparesis, and impaired balance & gait. Pt requires min assist to ambulate and decreased ability to correct gait pattern to reduce fall risk. Pt with R inattention, bumping doorway x 2 despite PT educating throughout session."  Per IPR note leading to discharge on 08/04/2022:  -pt currently amb. 400-500 ft at SBA -previous FGA scoring at 21/30 staging at a medium fall risk -working on dynamic gait/balance and R LE foot clearance    PAIN:  Are you having pain? No  PRECAUTIONS: Fall  WEIGHT BEARING RESTRICTIONS: No  FALLS: Has patient fallen in last 6 months? No  LIVING ENVIRONMENT: Lives with: lives with their spouse Lives in: House/apartment Stairs: Yes: Internal: 14 steps; on right going up and External: 4 steps; on right going up, on left going up, and can reach both Has following equipment at home: None  PLOF: Independent  PATIENT GOALS: to walk straight, work on memory  OBJECTIVE:   DIAGNOSTIC FINDINGS:  CT ANGIOGRAPHY HEAD on 06/05: IMPRESSION: 1. Normal CTA of the head.  No aneurysm or vascular malformation. 2. Unchanged appearance of left thalamic intraparenchymal hematoma, most consistent with hypertensive hemorrhage.   MRI HEAD WITHOUT AND WITH CONTRAST on 06/06: IMPRESSION:  1. No mass or other unexpected finding underlying the patient's left thalamic hematoma. 2. Chronic small vessel ischemia with chronic microhemorrhages.   COGNITION: Overall cognitive status: Impaired, as it relates slowed cognitive processes.  COORDINATION: WFL   LOWER EXTREMITY MMT:    MMT Right Eval Left Eval  Hip flexion 4 4  Hip extension     Hip abduction 4 4  Hip adduction 4 4  Hip internal rotation    Hip external rotation    Knee flexion 4- 4-  Knee extension 4+ 4+  Ankle dorsiflexion 4- 4-  Ankle plantarflexion    Ankle inversion    Ankle eversion    (Blank rows = not tested)  TRANSFERS: Assistive device utilized: None  Sit to stand:  SBA Stand to sit: SBA Floor:  possible assessment 2nd session  STAIRS: Level of Assistance: Modified independence Stair Negotiation Technique: Alternating Pattern  with Single Rail on Right Number of Stairs: 4  Height of Stairs: Standard  Comments: Use of hand rail going up, no use descending but was cautious.  GAIT: Gait pattern:  R sided drift and decreased arm swing- Right Distance walked: 942 Assistive device utilized: None Level of assistance: CGA  FUNCTIONAL TESTS:  Timed up and go (TUG): 8.92 sec 6 minute walk test: 942 feet 10 meter walk test: 12.37 sec,  Functional gait assessment: 26; see details below    PATIENT SURVEYS:  FOTO 73  TODAY'S TREATMENT:                                                                                                                              DATE: 08/22/22    Therapeutic activities:   Dynamic high knee march in // bars x 20 reps each LE - Difficulty with SLS initially yet did improve with practice.   Tandem gait in // bars- down and back x 4 - Mild instability with occasional UE reaching for support.   SLS up to 20 sec each LE after multiple attempts- 7 sec at min and 20+ sec at best   Dynamic walking with approx 25 lb box with 1 hand grip (to simulate carrying container of Freon at work)   Horizontal head turning 80 feet x 4 trials(some veering to right and inattention with right side) while calling out items on wall (sticky notes and pictures)   Forward step up/over 1/2 foam x 3 in // bars  x 6 (focusing on picking right foot) - no UE Support  PATIENT EDUCATION: Education details: Educated on instruction for tests  and measures, outcome measures, and POC.  Person educated: Patient Education method: Medical illustrator Education comprehension: verbalized understanding  HOME EXERCISE PROGRAM: To be initiated   GOALS: Goals reviewed with patient? No  SHORT TERM GOALS: Target date: 09/12/2022      Patient will be independent in home exercise program to improve strength/mobility for better functional independence with ADLs. Baseline: No HEP currently  Goal status: INITIAL     LONG TERM GOALS: Target date: 10/10/2022     1.  Patient will increase FOTO score to equal to or greater than  80   to demonstrate statistically significant improvement in mobility and quality of life.  Baseline: 73 Goal status: INITIAL   2.  Patient will increase FGA score by > 4 points to demonstrate decreased fall risk during functional activities. Baseline: 26 Goal status: INITIAL    3.   Patient will increase 10 meter walk test to >1.42m/s as to improve gait speed for better community ambulation and to reduce fall risk. Baseline: 12.37 sec, .81 m/s Goal status: INITIAL  4.   Patient will increase six minute walk test distance to >1200 for progression to advanced  community ambulator and improve gait ability Baseline: 942 feet Goal status: INITIAL  5.   Patient will successfully navigate a simulated community environment/obstacle course while successfully attending to R side obstacles without cueing from therapist. Baseline: assess 2nd session Goal status: INITIAL 6.   Patient will display an increase of LE functional strength of 4+/5 or better for various workplace tasks by carrying weighted objects greater distances without rest breaks, no LOB or any level of assist form therapist. Baseline:  assess 2nd session Goal status: INITIAL 7.   Patient will show increased safety with floor transfers by showing consistent and proper transfer mechanics at Modified independent assist or better provided by  therapist. Baseline: assess 2nd session Goal status: INITIAL   ASSESSMENT:  CLINICAL IMPRESSION: Patient presented with excellent motivation for today's treatment. He did present with some inattention to right side but able to improve with VC and reminders to scan. He did well mostly with picking his right foot up with no significant LOB today. He performed well with carrying a 25# object to simulate his work without LOB as well.  Pt will continue to benefit from skilled physical therapy intervention to address impairments, improve QOL, and attain therapy goals.    OBJECTIVE IMPAIRMENTS: Abnormal gait, decreased balance, decreased cognition, and decreased endurance.   ACTIVITY LIMITATIONS: carrying, lifting, squatting, and locomotion level  PARTICIPATION LIMITATIONS: driving, community activity, and occupation  PERSONAL FACTORS: Age, Past/current experiences, and Time since onset of injury/illness/exacerbation are also affecting patient's functional outcome.   REHAB POTENTIAL: Good  CLINICAL DECISION MAKING: Stable/uncomplicated  EVALUATION COMPLEXITY: Low  PLAN:  PT FREQUENCY: 1-2x/week  PT DURATION: 8 weeks  PLANNED INTERVENTIONS: Therapeutic exercises, Therapeutic activity, Neuromuscular re-education, Balance training, Gait training, Patient/Family education, Self Care, Joint mobilization, Stair training, Cryotherapy, Moist heat, and Re-evaluation  PLAN FOR NEXT SESSION:    Initiate HEP. High level dynamic balance and gait training.   Lenda Kelp, PT 08/22/2022, 5:31 PM  Physical Therapist - [facility]  5:31 PM 08/22/22

## 2022-08-24 ENCOUNTER — Telehealth: Payer: Self-pay | Admitting: *Deleted

## 2022-08-24 NOTE — Telephone Encounter (Signed)
-----   Message from Yvonne Kendall, MD sent at 08/23/2022  5:55 AM EDT ----- Hello,  Mr. Roxbury was recently hospitalized with a stroke.  We had agreed to obtain carotid Dopplers at our last visit in May, though it does not look like this has been scheduled yet.  Can you try to arrange for this to be done as soon as possible and have him follow-up with me or an APP shortly thereafter?  Echo was also ordered at our visit in May but can be cancelled, as one was performed when he was hospitalized last month.  Thanks.  Thayer Ohm  ----- Message ----- From: Joaquim Nam, MD Sent: 08/22/2022  11:18 PM EDT To: Yvonne Kendall, MD  Can you see about getting follow-up scheduled for this patient?  Can you see about getting his follow-up carotid study done?  Many thanks.

## 2022-08-24 NOTE — Telephone Encounter (Signed)
Reviewed the patient's chart.  He has an active order for a carotid duplex & echocardiogram from 06/2022. The patient had an echo completed on 07/26/22. Will cancel the active echo order and forward to scheduling to please try to help arrange for a Carotid Duplex as soon as possible.  The patient is already scheduled to see Zeiders Givens, NP on 08/30/22.

## 2022-08-24 NOTE — Telephone Encounter (Signed)
Attempted to schedule carotid, scheduler with [facility] off until Monday, left voicemail to contact me back to schedule.

## 2022-08-27 ENCOUNTER — Other Ambulatory Visit: Payer: Self-pay

## 2022-08-27 DIAGNOSIS — R531 Weakness: Secondary | ICD-10-CM

## 2022-08-28 ENCOUNTER — Ambulatory Visit (HOSPITAL_COMMUNITY)
Admission: RE | Admit: 2022-08-28 | Discharge: 2022-08-28 | Disposition: A | Discharge status: Home / Self Care | Payer: PPO | Source: Ambulatory Visit | Attending: Internal Medicine | Admitting: Internal Medicine

## 2022-08-28 ENCOUNTER — Other Ambulatory Visit: Payer: PPO

## 2022-08-28 DIAGNOSIS — R531 Weakness: Secondary | ICD-10-CM | POA: Insufficient documentation

## 2022-08-28 NOTE — Progress Notes (Signed)
Carotid ultrasound study completed.   Please see CV Procedures for preliminary results.  Shani Fitch, RVT  3:02 PM 08/28/22

## 2022-08-29 ENCOUNTER — Ambulatory Visit: Payer: PPO | Admitting: Physical Therapy

## 2022-08-29 ENCOUNTER — Ambulatory Visit: Payer: PPO | Admitting: Occupational Therapy

## 2022-08-29 ENCOUNTER — Ambulatory Visit: Payer: PPO | Admitting: Speech Pathology

## 2022-08-29 DIAGNOSIS — I619 Nontraumatic intracerebral hemorrhage, unspecified: Secondary | ICD-10-CM

## 2022-08-29 DIAGNOSIS — M6281 Muscle weakness (generalized): Secondary | ICD-10-CM | POA: Diagnosis not present

## 2022-08-29 DIAGNOSIS — R2689 Other abnormalities of gait and mobility: Secondary | ICD-10-CM

## 2022-08-29 DIAGNOSIS — R41841 Cognitive communication deficit: Secondary | ICD-10-CM

## 2022-08-29 DIAGNOSIS — R278 Other lack of coordination: Secondary | ICD-10-CM

## 2022-08-29 DIAGNOSIS — R4701 Aphasia: Secondary | ICD-10-CM

## 2022-08-29 DIAGNOSIS — R2681 Unsteadiness on feet: Secondary | ICD-10-CM

## 2022-08-29 DIAGNOSIS — R262 Difficulty in walking, not elsewhere classified: Secondary | ICD-10-CM

## 2022-08-29 NOTE — Therapy (Signed)
OUTPATIENT PHYSICAL THERAPY NEURO TREATMENT  Patient Name: Billy Kim MRN: [id number] DOB:[birth year], 77 y.o., male Today's Date: 08/29/2022   PCP: Joaquim Nam, MD  REFERRING PROVIDER: Joaquim Nam, MD   END OF SESSION:  PT End of Session - 08/29/22 1314     Visit Number 3    Number of Visits 16    Date for PT Re-Evaluation 10/10/22    Progress Note Due on Visit 10    PT Start Time 1400    PT Stop Time 1445    PT Time Calculation (min) 45 min    Equipment Utilized During Treatment Gait belt    Activity Tolerance Patient tolerated treatment well    Behavior During Therapy [facility] for tasks assessed/performed             Past Medical History:  Diagnosis Date   Coronary artery disease 01/2020   Moderate proximal LAD disease (not hemodynamically significant) and CTO's of D1 and non-dominant RCA   HLD (hyperlipidemia)    Hypertension    Ulcerative colitis 5/81   Remission for years   Past Surgical History:  Procedure Laterality Date   CARDIAC CATHETERIZATION  03/20/01   Cardiolite EF 55% 02/10/02   CHOLECYSTECTOMY N/A 07/09/2017   Procedure: LAPAROSCOPIC CHOLECYSTECTOMY;  Surgeon: Jimmye Norman, MD;  Location: [facility] OR;  Service: General;  Laterality: N/A;   COLONOSCOPY  multiple   ENDOSCOPIC RETROGRADE CHOLANGIOPANCREATOGRAPHY (ERCP) WITH PROPOFOL N/A 07/08/2017   Procedure: ENDOSCOPIC RETROGRADE CHOLANGIOPANCREATOGRAPHY (ERCP) WITH PROPOFOL;  Surgeon: Lynann Bologna, MD;  Location: [facility] ENDOSCOPY;  Service: Endoscopy;  Laterality: N/A;   INGUINAL HERNIA REPAIR  04/09/06   Bilateral   LAPAROSCOPIC APPENDECTOMY  03/1981   LEFT HEART CATH AND CORONARY ANGIOGRAPHY Left 12/11/2019   Procedure: LEFT HEART CATH AND CORONARY ANGIOGRAPHY;  Surgeon: Yvonne Kendall, MD;  Location: [facility] INVASIVE CV LAB;  Service: Cardiovascular;  Laterality: Left;   REMOVAL OF STONES  07/08/2017   Procedure: REMOVAL OF STONES;  Surgeon: Lynann Bologna, MD;  Location: [facility] ENDOSCOPY;  Service:  Endoscopy;;   SPHINCTEROTOMY  07/08/2017   Procedure: Dennison Mascot;  Surgeon: Lynann Bologna, MD;  Location: [facility] ENDOSCOPY;  Service: Endoscopy;;   Patient Active Problem List   Diagnosis Date Noted   Intraparenchymal hemorrhage of brain (HCC) 07/31/2022   AKI (acute kidney injury) (HCC) 07/27/2022   Hypertensive emergency 07/27/2022   Nontraumatic subcortical hemorrhage of left cerebral hemisphere (HCC) 07/25/2022   Weakness 07/20/2022   Leg swelling 07/20/2022   Dizziness 11/03/2021   Abscess 08/07/2021   Actinic keratosis 08/07/2021   Melanocytic nevi of trunk 08/07/2021   Nevus of back 08/07/2021   Personal history of other malignant neoplasm of skin 08/07/2021   Medicare annual wellness visit, subsequent 11/09/2020   Coronary artery disease of native artery of native heart with stable angina pectoris (HCC) 12/17/2019   Mixed hyperlipidemia 12/17/2019   Accelerating angina (HCC) 12/11/2019   Shingles 03/01/2019   FH: prostate cancer 02/21/2017   Advance care planning 03/31/2014   PSA elevation 03/27/2013   Thrombocytopenia, unspecified (HCC) 03/27/2013   Acute cough 03/12/2013   ED (erectile dysfunction) 12/28/2011   Neoplasm of uncertain behavior of skin 12/27/2010   Exertional chest pain 01/05/2010   Essential hypertension 04/08/2007   HYPERTRIGLYCERIDEMIA 01/08/2007   GILBERT'S SYNDROME 01/08/2007   ULCERATIVE COLITIS 01/08/2007    ONSET DATE: 07/25/2022  REFERRING DIAG: Intraparenchymal hemorrhage of brain (HCC)   THERAPY DIAG:  Muscle weakness (generalized)  Other lack of coordination  Other abnormalities of  gait and mobility  Unsteadiness on feet  Difficulty in walking, not elsewhere classified  Rationale for Evaluation and Treatment: Rehabilitation  SUBJECTIVE:                                                                                                                                                                                              SUBJECTIVE STATEMENT:   Pt reports that he is doing "great." No pain. Was able to get together with family for fourth. No falls reported since last PT session.     Pt accompanied by: self  PERTINENT HISTORY:  Per acute care PT evaluation on 07/27/2022: "Prior to admission pt was independent, working in [employer], and driving. On this date, pt presents with expressive communication deficits, R inattention, R hemiparesis, and impaired balance & gait. Pt requires min assist to ambulate and decreased ability to correct gait pattern to reduce fall risk. Pt with R inattention, bumping doorway x 2 despite PT educating throughout session."  Per IPR note leading to discharge on 08/04/2022:  -pt currently amb. 400-500 ft at SBA -previous FGA scoring at 21/30 staging at a medium fall risk -working on dynamic gait/balance and R LE foot clearance    PAIN:  Are you having pain? No  PRECAUTIONS: Fall  WEIGHT BEARING RESTRICTIONS: No  FALLS: Has patient fallen in last 6 months? No  LIVING ENVIRONMENT: Lives with: lives with their spouse Lives in: House/apartment Stairs: Yes: Internal: 14 steps; on right going up and External: 4 steps; on right going up, on left going up, and can reach both Has following equipment at home: None  PLOF: Independent  PATIENT GOALS: to walk straight, work on memory  OBJECTIVE:   DIAGNOSTIC FINDINGS:  CT ANGIOGRAPHY HEAD on 06/05: IMPRESSION: 1. Normal CTA of the head.  No aneurysm or vascular malformation. 2. Unchanged appearance of left thalamic intraparenchymal hematoma, most consistent with hypertensive hemorrhage.   MRI HEAD WITHOUT AND WITH CONTRAST on 06/06: IMPRESSION:  1. No mass or other unexpected finding underlying the patient's left thalamic hematoma. 2. Chronic small vessel ischemia with chronic microhemorrhages.   COGNITION: Overall cognitive status: Impaired, as it relates slowed cognitive processes.  COORDINATION: WFL   LOWER EXTREMITY  MMT:    MMT Right Eval Left Eval  Hip flexion 4 4  Hip extension    Hip abduction 4 4  Hip adduction 4 4  Hip internal rotation    Hip external rotation    Knee flexion 4- 4-  Knee extension 4+ 4+  Ankle dorsiflexion 4- 4-  Ankle plantarflexion    Ankle inversion    Ankle eversion    (  Blank rows = not tested)  TRANSFERS: Assistive device utilized: None  Sit to stand: SBA Stand to sit: SBA Floor:  possible assessment 2nd session  STAIRS: Level of Assistance: Modified independence Stair Negotiation Technique: Alternating Pattern  with Single Rail on Right Number of Stairs: 4  Height of Stairs: Standard  Comments: Use of hand rail going up, no use descending but was cautious.  GAIT: Gait pattern:  R sided drift and decreased arm swing- Right Distance walked: 942 Assistive device utilized: None Level of assistance: CGA  FUNCTIONAL TESTS:  Timed up and go (TUG): 8.92 sec 6 minute walk test: 942 feet 10 meter walk test: 12.37 sec,  Functional gait assessment: 26; see details below    PATIENT SURVEYS:  FOTO 73  TODAY'S TREATMENT:                                                                                                                              DATE: 08/29/22    Therapeutic activities:   Reciprocal foot tap on 6inch step. X 10 bil  Static standing with 1 foot on airex pad, 1 foot on 6inch step. 2 x 20 sec bil  Reciprocal foot tap on 6inch step from airex pad x 10 bil   Blaze pods level ground random. X 1 min  Blaze pods level ground focus x 45 sec Blaze pods on stairs random 2x 1 min   Supervision assist from PT for safety on level ground with 2 mild LOB, but able to correct without session. 2 LOB on stairs requiring min assist from PT and UE support on rail to prevent fall.    Horizontal head turning 120 feet x 66 trials(some veering to right and inattention with right side) while calling out items on wall (sticky notes and pictures)  Head nod  while walking x146ft x 4 Naming car bands x 87ft then naming fruits to 58ft. Pt able to name ~ 6 of each prior to requiring questioning and statement cues to name any additional items in category.    PATIENT EDUCATION: Education details: Educated on instruction for tests and measures, outcome measures, and POC.  Person educated: Patient Education method: Medical illustrator Education comprehension: verbalized understanding  HOME EXERCISE PROGRAM: To be initiated    GOALS: Goals reviewed with patient? No  SHORT TERM GOALS: Target date: 09/12/2022      Patient will be independent in home exercise program to improve strength/mobility for better functional independence with ADLs. Baseline: No HEP currently  Goal status: INITIAL     LONG TERM GOALS: Target date: 10/10/2022     1.  Patient will increase FOTO score to equal to or greater than  80   to demonstrate statistically significant improvement in mobility and quality of life.  Baseline: 73 Goal status: INITIAL   2.  Patient will increase FGA score by > 4 points to demonstrate decreased fall risk during functional activities. Baseline: 26 Goal status:  INITIAL    3.   Patient will increase 10 meter walk test to >1.69m/s as to improve gait speed for better community ambulation and to reduce fall risk. Baseline: 12.37 sec, .81 m/s Goal status: INITIAL  4.   Patient will increase six minute walk test distance to >1200 for progression to advanced community ambulator and improve gait ability Baseline: 942 feet Goal status: INITIAL  5.   Patient will successfully navigate a simulated community environment/obstacle course while successfully attending to R side obstacles without cueing from therapist. Baseline: assess 2nd session Goal status: INITIAL 6.   Patient will display an increase of LE functional strength of 4+/5 or better for various workplace tasks by carrying weighted objects greater distances without  rest breaks, no LOB or any level of assist form therapist. Baseline:  assess 2nd session Goal status: INITIAL 7.   Patient will show increased safety with floor transfers by showing consistent and proper transfer mechanics at Modified independent assist or better provided by therapist. Baseline: assess 2nd session Goal status: INITIAL   ASSESSMENT:  CLINICAL IMPRESSION: Patient presented with excellent motivation for today's treatment. Mild LOB intermittently throughout session on RLE, requiring assist to prevent fall on stairs with high level dynamic balance task. The Blaze Pod Random setting was chosen to enhance cognitive processing and agility, providing an unpredictable environment to simulate real-world scenarios, and fostering quick reactions and adaptability.   Pt will continue to benefit from skilled physical therapy intervention to address impairments, improve QOL, and attain therapy goals.    OBJECTIVE IMPAIRMENTS: Abnormal gait, decreased balance, decreased cognition, and decreased endurance.   ACTIVITY LIMITATIONS: carrying, lifting, squatting, and locomotion level  PARTICIPATION LIMITATIONS: driving, community activity, and occupation  PERSONAL FACTORS: Age, Past/current experiences, and Time since onset of injury/illness/exacerbation are also affecting patient's functional outcome.   REHAB POTENTIAL: Good  CLINICAL DECISION MAKING: Stable/uncomplicated  EVALUATION COMPLEXITY: Low  PLAN:  PT FREQUENCY: 1-2x/week  PT DURATION: 8 weeks  PLANNED INTERVENTIONS: Therapeutic exercises, Therapeutic activity, Neuromuscular re-education, Balance training, Gait training, Patient/Family education, Self Care, Joint mobilization, Stair training, Cryotherapy, Moist heat, and Re-evaluation  PLAN FOR NEXT SESSION:   Balance HEP  High level dynamic balance and gait training.   Golden Pop, PT 08/29/2022, 4:51 PM  Physical Therapist - [facility]  4:51 PM 08/29/22

## 2022-08-29 NOTE — Therapy (Signed)
OUTPATIENT SPEECH LANGUAGE PATHOLOGY  TREATMENT NOTE   Patient Name: Billy Kim MRN: [id number] DOB:09/03/1945, 77 y.o., male Today's Date: 08/29/2022  PCP: Crawford Givens, MD REFERRING PROVIDER: Claudette Laws, MD   End of Session - 08/29/22 1446     Visit Number 2    Number of Visits 25    Date for SLP Re-Evaluation 11/07/22    Authorization Type [employer]    Progress Note Due on Visit 10    SLP Start Time 1445    SLP Stop Time  1530    SLP Time Calculation (min) 45 min    Activity Tolerance Patient tolerated treatment well             No past medical history on file. The histories are not reviewed yet. Please review them in the "History" navigator section and refresh this SmartLink. Patient Active Problem List   Diagnosis Date Noted   Intraparenchymal hemorrhage of brain (HCC) 07/31/2022   AKI (acute kidney injury) (HCC) 07/27/2022   Hypertensive emergency 07/27/2022   Nontraumatic subcortical hemorrhage of left cerebral hemisphere (HCC) 07/25/2022   Weakness 07/20/2022   Leg swelling 07/20/2022   Dizziness 11/03/2021   Abscess 08/07/2021   Actinic keratosis 08/07/2021   Melanocytic nevi of trunk 08/07/2021   Nevus of back 08/07/2021   Personal history of other malignant neoplasm of skin 08/07/2021   Medicare annual wellness visit, subsequent 11/09/2020   Coronary artery disease of native artery of native heart with stable angina pectoris (HCC) 12/17/2019   Mixed hyperlipidemia 12/17/2019   Accelerating angina (HCC) 12/11/2019   Shingles 03/01/2019   FH: prostate cancer 02/21/2017   Advance care planning 03/31/2014   PSA elevation 03/27/2013   Thrombocytopenia, unspecified (HCC) 03/27/2013   Acute cough 03/12/2013   ED (erectile dysfunction) 12/28/2011   Neoplasm of uncertain behavior of skin 12/27/2010   Exertional chest pain 01/05/2010   Essential hypertension 04/08/2007   HYPERTRIGLYCERIDEMIA 01/08/2007   GILBERT'S SYNDROME 01/08/2007    ULCERATIVE COLITIS 01/08/2007    ONSET DATE: 07/25/2022   REFERRING DIAG: I61.9 (ICD-10-CM) - Intraparenchymal hemorrhage of brain (HCC)   THERAPY DIAG:  Cognitive communication deficit  Aphasia  Intraparenchymal hemorrhage of brain (HCC)  Rationale for Evaluation and Treatment Rehabilitation  SUBJECTIVE:   PERTINENT HISTORY and DIAGNOSTIC FINDINGS: Pt is a right handed 77 year old male with past medical history of HLD, HTN, CAD with recent intra parenchymal hemorrhage on 07/25/2022. MRI (07/26/2022) revealed "known acute hemorrhage in the left thalamus measuring up to 2 cm and chronic small vessel ischemia with chronic microhemorrhages."   PAIN:  Are you having pain? No   FALLS: Has patient fallen in last 6 months?  No  LIVING ENVIRONMENT: Lives with: lives with their spouse Lives in: House/apartment  PLOF:  Level of assistance: Independent with ADLs, Independent with IADLs Employment: Full-time employment   PATIENT GOALS   to get better  SUBJECTIVE STATEMENT: Pt with increased smiling, interaction, laughing Pt accompanied by: significant other who remained in the lobby  OBJECTIVE:   TODAY'S TREATMENT:  Skilled ST session focused on pt's cognitive communication goals. SLP facilitated the session by providing the following interventions:  Communicative Participation Item Bank (CPIB) Age Range: 18+   The Communicative Participation Item Bank (CPIB) is a patient-reported outcomes instrument that targets the construct of communicative participation [employer] et al., 2013). The CPIB was developed with the intent of being appropriate for community-dwelling adults in the variety of conversational situations they frequently engage in as part  of life roles at home, at work, and in social and leisure situations. All items in the CPIB start with the stem, 'Does your condition interfere with..' followed by various conversational situations such as 'Making a phone call to get  information,' or 'Having a conversation in a noisy place.' Respondents choose from four response categories to rate the level of interference they experience in that situation, ranging from 'Not at all,' to 'Very much.' The item bank consists of 46 items, and a paper-and-pencil 10-item disorder-generic short form is available. The CPIB scores are reported as T-scores where 50 = the mean of the calibration sample and standard deviation = 10.  Does your condition interfere with talking with people you know?  A LITTLE (value=2) Does your condition interfere with communicating when you need to say something quickly?  A LITTLE (value=2) Does your condition interfere with talking with people you do NOT know? QUITE A BIT (value=1) Does your condition interfere with communicating when you are out in the community (e.g., running errands, appointments)?  A LITTLE (value=2) Does your condition interfere with asking questions in a conversation?  NOT AT ALL (value=3) Does your condition interfere with communicating in a small group of people?  A LITTLE (value=2) Does your condition interfere with having a long conversation with someone you know about a book, movie, show or sports event?  A LITTLE (value=2) Does your condition interfere with giving someone DETAILED information?  A LITTLE (value=2) Does your condition interfere with getting your turn in a fast-moving conversation?  A LITTLE (value=2) Does your condition interfere with trying to persuade a friend or family member to see a different point of view?  A LITTLE (value=2)  Pt's T-Score is 52.70 which is considered to be within average mean of 50.     SLP requested pt explain solutions to various work related scenarios. Pt able to provide detailed organized response.   Higher level conversation probes - Pt was Mod I for use of word finding strategies to provide a cohesive, coherent response.    PATIENT EDUCATION: Education details: see above Person  educated: Patient and Spouse Education method: Explanation Education comprehension: needs further education  HOME EXERCISE PROGRAM:        Engage with his wife concerning conversation starters  Read out loud  GOALS:  Goals reviewed with patient? Yes  SHORT TERM GOALS: Target date: 10 sessions  With Min A, patient will describe visual scenes using 3 or more sentences at 95% accuracy. Baseline: Goal status: INITIAL   2.  Pt will report improved cognitive communication via PROM by 5 points at last ST session    Baseline:  Goal status: INITIAL  3.   With Min A, patient will recall functional verbal information 80% accuracy with use of compensations/strategies (repeats, rephrasing, notetaking, etc).   Baseline:  Goal status: INITIAL  4.  With Min A, patient will generate sentences with 3 or more words in response to a situation at 95% accuracy in order to increase ability to communicate basic wants and needs.    Baseline:  Goal status: INITIAL  LONG TERM GOALS: Target date: 11/07/2022  With Supervision A, patient will participate in complex conversation at 90% accuracy to increase ability to communicate complex thoughts, feelings, and needs.  Baseline:  Goal status: INITIAL  2.   With Supervision A, patient will demonstrate strategies for processing/recall (eg notetaking, paraphrasing) successfully by recalling >90% of details from 15 minutes verbal information.   Baseline:  Goal status: INITIAL  ASSESSMENT:  CLINICAL IMPRESSION: Patient is a 77 y.o. right handed male who was seen today for cognitive communication treatment session d/t recent left thalamic hemorrhage.  Pt with great improvement over previous evaluation (~ 2 weeks prior). Pt with increased pragmatics and verbal expression. See above treatment note for details.   OBJECTIVE IMPAIRMENTS include memory, executive functioning, expressive language, and aphasia. These impairments are limiting patient from return to  work, managing medications, managing appointments, managing finances, household responsibilities, ADLs/IADLs, and effectively communicating at home and in community. Factors affecting potential to achieve goals and functional outcome are  N/A .Marland Kitchen Patient will benefit from skilled SLP services to address above impairments and improve overall function.  REHAB POTENTIAL: Excellent  PLAN: SLP FREQUENCY: 1-2x/week  SLP DURATION: 12 weeks  PLANNED INTERVENTIONS: Language facilitation, Cueing hierachy, Cognitive reorganization, Internal/external aids, Functional tasks, SLP instruction and feedback, Compensatory strategies, and Patient/family education   Chayanne Filippi B. Dreama Saa, M.S., CCC-SLP, Tree surgeon Certified Brain Injury Specialist [facility]  [facility] Rehabilitation Services Office [phone] Ascom [phone] Fax [phone]

## 2022-08-29 NOTE — Therapy (Addendum)
OUTPATIENT OCCUPATIONAL THERAPY NEURO TREATMENT NOTE  Patient Name: Billy Kim MRN: [id number] DOB:07/23/1945, 77 y.o., male Today's Date: 08/29/2022  PCP: Joaquim Nam, MD REFERRING PROVIDER: Charlton Amor, PA-C  END OF SESSION:  OT End of Session - 08/29/22 1407     Visit Number 2    Number of Visits 12    Date for OT Re-Evaluation 11/14/22    OT Start Time 1315    OT Stop Time 1400    OT Time Calculation (min) 45 min    Activity Tolerance Patient tolerated treatment well    Behavior During Therapy [facility] for tasks assessed/performed             Past Medical History:  Diagnosis Date   Coronary artery disease 01/2020   Moderate proximal LAD disease (not hemodynamically significant) and CTO's of D1 and non-dominant RCA   HLD (hyperlipidemia)    Hypertension    Ulcerative colitis 5/81   Remission for years   Past Surgical History:  Procedure Laterality Date   CARDIAC CATHETERIZATION  03/20/01   Cardiolite EF 55% 02/10/02   CHOLECYSTECTOMY N/A 07/09/2017   Procedure: LAPAROSCOPIC CHOLECYSTECTOMY;  Surgeon: Jimmye Norman, MD;  Location: [facility] OR;  Service: General;  Laterality: N/A;   COLONOSCOPY  multiple   ENDOSCOPIC RETROGRADE CHOLANGIOPANCREATOGRAPHY (ERCP) WITH PROPOFOL N/A 07/08/2017   Procedure: ENDOSCOPIC RETROGRADE CHOLANGIOPANCREATOGRAPHY (ERCP) WITH PROPOFOL;  Surgeon: Lynann Bologna, MD;  Location: [facility] ENDOSCOPY;  Service: Endoscopy;  Laterality: N/A;   INGUINAL HERNIA REPAIR  04/09/06   Bilateral   LAPAROSCOPIC APPENDECTOMY  03/1981   LEFT HEART CATH AND CORONARY ANGIOGRAPHY Left 12/11/2019   Procedure: LEFT HEART CATH AND CORONARY ANGIOGRAPHY;  Surgeon: Yvonne Kendall, MD;  Location: [facility] INVASIVE CV LAB;  Service: Cardiovascular;  Laterality: Left;   REMOVAL OF STONES  07/08/2017   Procedure: REMOVAL OF STONES;  Surgeon: Lynann Bologna, MD;  Location: [facility] ENDOSCOPY;  Service: Endoscopy;;   SPHINCTEROTOMY  07/08/2017   Procedure: Dennison Mascot;  Surgeon:  Lynann Bologna, MD;  Location: [facility] ENDOSCOPY;  Service: Endoscopy;;   Patient Active Problem List   Diagnosis Date Noted   Intraparenchymal hemorrhage of brain (HCC) 07/31/2022   AKI (acute kidney injury) (HCC) 07/27/2022   Hypertensive emergency 07/27/2022   Nontraumatic subcortical hemorrhage of left cerebral hemisphere (HCC) 07/25/2022   Weakness 07/20/2022   Leg swelling 07/20/2022   Dizziness 11/03/2021   Abscess 08/07/2021   Actinic keratosis 08/07/2021   Melanocytic nevi of trunk 08/07/2021   Nevus of back 08/07/2021   Personal history of other malignant neoplasm of skin 08/07/2021   Medicare annual wellness visit, subsequent 11/09/2020   Coronary artery disease of native artery of native heart with stable angina pectoris (HCC) 12/17/2019   Mixed hyperlipidemia 12/17/2019   Accelerating angina (HCC) 12/11/2019   Shingles 03/01/2019   FH: prostate cancer 02/21/2017   Advance care planning 03/31/2014   PSA elevation 03/27/2013   Thrombocytopenia, unspecified (HCC) 03/27/2013   Acute cough 03/12/2013   ED (erectile dysfunction) 12/28/2011   Neoplasm of uncertain behavior of skin 12/27/2010   Exertional chest pain 01/05/2010   Essential hypertension 04/08/2007   HYPERTRIGLYCERIDEMIA 01/08/2007   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 Reports that he is doing well today. Pt. reports that he  enjoyed doing the simulated pill box activity today and he found it very helpful and challenging. Pt accompanied by: self  PERTINENT HISTORY: Pt. Is a 77 y.o. male who has been diagnosed with a Intraparenchymal hemorrhage of the brain (HCC). Other dx include AKI, hypertension, hyperlipidemia, and history  of CAD and ulcerative colitis. Pt. Presents with expressive communication deficits, R inattention, and R hemiparesis and impaired balance.   PRECAUTIONS: No driving WEIGHT BEARING RESTRICTIONS: No  PAIN:  Are you having pain? No  FALLS: Has patient fallen in last 6 months? No  LIVING ENVIRONMENT: Lives with: lives with their spouse Lives in: 2 story home Stairs: Yes: Internal: 14 steps; and External: 4 steps; can reach both Has following equipment at home: None  PLOF: Independent  PATIENT GOALS: Work on improving memory, and balance on the R side  OBJECTIVE:   HAND DOMINANCE: Right  ADLs:  Eating: Independent however holds utensils differently with the right hand, grabs handle with entire hand Grooming: Independent UB Dressing: Independent LB Dressing: Takes longer to put on pants due to balance, independent with tying shoes and putting socks on when sitting down Toileting: Independent Bathing: Tub shower, standing, holds onto wall to keep balance Tub Shower transfers: Independent, hold onto wall Equipment: none  IADLs: Shopping: Does with spouse, pushes cart to help balance,  Light housekeeping: Wife does most of it now, increased time due to being cautious to ensure he does not lose his balance Meal Prep: Wife prepares all meals, able to go to fridge to get out a drink Community mobility: Uses grocery cart to balance Medication management: Wife sets them out each day for him, pt. Takes them independently Financial management: Spouse handles finance Handwriting: 100% legible  MOBILITY STATUS: Independent however often has to hold onto things to ensure balance  POSTURE COMMENTS:  No Significant postural limitations Sitting balance:  WFL  ACTIVITY TOLERANCE: Activity tolerance: WFL  FUNCTIONAL OUTCOME MEASURES: FOTO: Eval: 98 TR:98  (FOTO goal is not indicated at this time)  UPPER EXTREMITY ROM:    Active ROM Right WFL Left WFL  Shoulder flexion     Shoulder abduction    Shoulder adduction    Shoulder extension    Shoulder internal rotation    Shoulder external rotation    Elbow flexion    Elbow extension    Wrist flexion    Wrist extension    Wrist ulnar deviation    Wrist radial deviation    Wrist pronation    Wrist supination    (Blank rows = not tested)  UPPER EXTREMITY MMT:     MMT Right eval Left eval  Shoulder flexion 5/5 5/5  Shoulder abduction 5/5 5/5  Shoulder adduction    Shoulder extension    Shoulder internal rotation    Shoulder external rotation    Middle trapezius    Lower trapezius    Elbow flexion 5/5 5/5  Elbow extension 5/5 5/5  Wrist flexion 5/5 5/5  Wrist extension    Wrist ulnar deviation    Wrist radial deviation    Wrist pronation    Wrist supination    (Blank rows = not tested)  HAND FUNCTION: Grip strength: Right: 87 lbs; Left: 102 lbs, Lateral pinch: Right: 21 lbs, Left: 21 lbs, and 3 point pinch: Right: 17 lbs, Left: 18 lbs  COORDINATION: 9 Hole Peg test: Right: 29 sec; Left: 25 sec  SENSATION: Light touch: WFL Stereognosis: WFL  EDEMA: None  MUSCLE TONE: None  COGNITION: Overall  cognitive status: Impaired memory  VISION: Subjective report: Pt. Reports he has not noticed any vision changes. Baseline vision: Wears glasses all the time Visual history:  None  VISION ASSESSMENT: To be further assessed in functional context  PERCEPTION: TBD  PRAXIS: WFL  TODAY'S TREATMENT:                                                                                                                              DATE: 08/29/2022  Therapeutic Exercise:   Pt. facilitated R hand strengthening with use of hand gripper set at 28.9# for 2 trials to remove jumbo pegs from the peg board and place pegs back into container located at different heights. Pt. Worked on R hand strengthening by holding a 4# dowel horizontally and vertically through gripping the dowel with his R hand. Resistance  was applied by therapist placing force on dowel up/down and side to side directions. Pt. Was challenged to maintain a sturdy grip with the R hand on the 4# dowel with external force being applied. Pt. Was given an education handout and educated on theraputty exercises to complete at home for R hand strengthening. Pt. Used dark blue resistive putty to demonstrate exercises and verbalized thathe understood each exercise. Exercises including: gross gripping, gross digit extension, thumb abduction, lateral, and 3pt. Pinch strengthening, digit abduction, and thumb opposition. Pt. was provided with a visual handout HEP through [employer].   Self Care/Home Management:   Pt. Worked on simulated medication management task. Pt. Was able to problem solve through setting up small pill box following instructions with increased verbal cues to ensure proper set up.   PATIENT EDUCATION: Education details: Handout on theraputty exercises for R hand strengthening Person educated: Patient Education method: Explanation, Demonstration, and Verbal cues Education comprehension: verbalized understanding  HOME EXERCISE PROGRAM: Theraputty exercises for R hand strengthening    GOALS: Goals reviewed with patient? Yes  SHORT TERM GOALS: Target date: 10/03/2022    Pt. Will be independent with HEP for increasing R hand strength and coordination. Baseline: Currently no HEP Goal status: INITIAL  LONG TERM GOALS: Target date: 11/14/22  Pt. Will increase R grip strength by 5# to be able to securely hold and carry objects for ADL/IADL tasks.  Baseline: Grip strength: Right: 87 lbs; Left: 102 lbs Goal status: INITIAL  2.  Pt. Will improve R FMC skills by 2 seconds of speed to independently manipulate small objects during ADL/IADL tasks.  Baseline: Eval: 9 hole peg test: L: 25 seconds, R: 29 seconds Goal status: INITIAL  3.  Pt. Will demonstrate simulated pill box setup with supervision.  Baseline: Eval: Pts. Wife  currently sets out medicine for Pt. To take, Pt. Has pill organizer but does not use it yet. Goal status: INITIAL  4.  Pt. Will independently demonstrate holding utensil with proper form to increase engagement with eating tasks.   Baseline: Pt. Currently uses full grasp to hold utensil while eating rather than  Goal status: INITIAL   ASSESSMENT:  CLINICAL IMPRESSION: Pt. Tolerated all therapeutic exercises well today. Pt. Was able to use hand gripper set to 28.9# for both trials and Was able to grip 4# dowel with force applied side to side and up/down. Pt. Was receptive of theraputty exercises and properly demonstrated how to complete them however required increased verbal cues to ensure proper form of each exercise. Pt. Was given the website link to review proper form of theraputty exercises incase a refresher is needed. Pt. Worked on setting up a small pill box following instructions. Pt. Required increased verbal cues throughout to ensure pills were placed correctly and encouraged to read the instructions multiple times. Pt. Reported he felt this activity was very helpful for him. Pt. Was able to Pt. Would benefit from skilled OT services to improve RUE strength,  to improve R hand coordination to efficiently manipulate objects in order to increase  engagement in and independence in ADL/IADL tasks.  PERFORMANCE DEFICITS: in functional skills including ADLs, IADLs, coordination, strength, and UE functional use, cognitive skills including memory, and psychosocial skills including coping strategies, environmental adaptation, and routines and behaviors.   IMPAIRMENTS: are limiting patient from ADLs, IADLs, and work.   CO-MORBIDITIES: has no other co-morbidities that affects occupational performance. Patient will benefit from skilled OT to address above impairments and improve overall function.  MODIFICATION OR ASSISTANCE TO COMPLETE EVALUATION: No modification of tasks or assist necessary to  complete an evaluation.  OT OCCUPATIONAL PROFILE AND HISTORY: Detailed assessment: Review of records and additional review of physical, cognitive, psychosocial history related to current functional performance.  CLINICAL DECISION MAKING: Moderate - several treatment options, min-mod task modification necessary  REHAB POTENTIAL: Good  EVALUATION COMPLEXITY: Moderate    PLAN:  OT FREQUENCY: 1x/week  OT DURATION: 12 weeks  PLANNED INTERVENTIONS: self care/ADL training, therapeutic exercise, therapeutic activity, neuromuscular re-education, and patient/family education  RECOMMENDED OTHER SERVICES: ST, PT  CONSULTED AND AGREED WITH PLAN OF CARE: Patient  PLAN FOR NEXT SESSION: Initiate Treatment   Herma Carson, Student-OT 08/29/2022, 2:11 PM  This entire session was performed under the direct supervision and direction of a licensed therapist. I have personally read, edited, and approve of the note as written.   Olegario Messier, MS, OTR/L   08/29/2022

## 2022-08-30 ENCOUNTER — Encounter: Payer: Self-pay | Admitting: Nurse Practitioner

## 2022-08-30 ENCOUNTER — Ambulatory Visit: Payer: PPO | Attending: Physician Assistant | Admitting: Nurse Practitioner

## 2022-08-30 VITALS — BP 130/86 | HR 64 | Ht 67.0 in | Wt 179.4 lb

## 2022-08-30 DIAGNOSIS — E781 Pure hyperglyceridemia: Secondary | ICD-10-CM

## 2022-08-30 DIAGNOSIS — I10 Essential (primary) hypertension: Secondary | ICD-10-CM | POA: Diagnosis not present

## 2022-08-30 DIAGNOSIS — I2510 Atherosclerotic heart disease of native coronary artery without angina pectoris: Secondary | ICD-10-CM | POA: Diagnosis not present

## 2022-08-30 DIAGNOSIS — E782 Mixed hyperlipidemia: Secondary | ICD-10-CM | POA: Diagnosis not present

## 2022-08-30 DIAGNOSIS — I619 Nontraumatic intracerebral hemorrhage, unspecified: Secondary | ICD-10-CM

## 2022-08-30 NOTE — Progress Notes (Signed)
Office Visit    Patient Name: Billy Kim Date of Encounter: 08/30/2022  Primary Care Provider:  Joaquim Nam, MD Primary Cardiologist:  Yvonne Kendall, MD  Chief Complaint    77 y.o. y/o male with a history of CAD, hypertension, hyperlipidemia, and ulcerative colitis, presents for follow-up after recent admission for intraparenchymal hemorrhage in the left thalamus.  Past Medical History    Past Medical History:  Diagnosis Date   Coronary artery disease    a. 11/2019 Cath: Moderate proximal LAD disease (not hemodynamically significant) and CTO's of D1 and non-dominant RCA.   HLD (hyperlipidemia)    Hypertension    Intracerebral hemorrhage (HCC)    a. 07/2022 CT Head: acute intraparenchymal hemorrhage in the left thalamus without midline shift or mass effect.   Systolic murmur    a. 07/2022 Echo: EF 60-65%, no rwma, GrI DD, nl RV fxn, RVSP 15.59mmHg, no significant valvular disease.   Ulcerative colitis 06/1979   Remission for years   Past Surgical History:  Procedure Laterality Date   CARDIAC CATHETERIZATION  03/20/01   Cardiolite EF 55% 02/10/02   CHOLECYSTECTOMY N/A 07/09/2017   Procedure: LAPAROSCOPIC CHOLECYSTECTOMY;  Surgeon: Jimmye Norman, MD;  Location: [facility] OR;  Service: General;  Laterality: N/A;   COLONOSCOPY  multiple   ENDOSCOPIC RETROGRADE CHOLANGIOPANCREATOGRAPHY (ERCP) WITH PROPOFOL N/A 07/08/2017   Procedure: ENDOSCOPIC RETROGRADE CHOLANGIOPANCREATOGRAPHY (ERCP) WITH PROPOFOL;  Surgeon: Lynann Bologna, MD;  Location: [facility] ENDOSCOPY;  Service: Endoscopy;  Laterality: N/A;   INGUINAL HERNIA REPAIR  04/09/06   Bilateral   LAPAROSCOPIC APPENDECTOMY  03/1981   LEFT HEART CATH AND CORONARY ANGIOGRAPHY Left 12/11/2019   Procedure: LEFT HEART CATH AND CORONARY ANGIOGRAPHY;  Surgeon: Yvonne Kendall, MD;  Location: [facility] INVASIVE CV LAB;  Service: Cardiovascular;  Laterality: Left;   REMOVAL OF STONES  07/08/2017   Procedure: REMOVAL OF STONES;  Surgeon: Lynann Bologna, MD;  Location: [facility] ENDOSCOPY;  Service: Endoscopy;;   SPHINCTEROTOMY  07/08/2017   Procedure: Dennison Mascot;  Surgeon: Lynann Bologna, MD;  Location: [facility] ENDOSCOPY;  Service: Endoscopy;;    Allergies  Allergies  Allergen Reactions   Amoxicillin Other (See Comments)    Caused headache Has patient had a PCN reaction causing immediate rash, facial/tongue/throat swelling, SOB or lightheadedness with hypotension: No Has patient had a PCN reaction causing severe rash involving mucus membranes or skin necrosis: No Has patient had a PCN reaction that required hospitalization: No Has patient had a PCN reaction occurring within the last 10 years: No If all of the above answers are "NO", then may proceed with Cephalosporin use.   Lyrica [Pregabalin]     Intolerant, worsening headache   Morphine Sulfate Other (See Comments)    headache   Neurontin [Gabapentin] Other (See Comments)    headache   Statins     myalgias   Sulfonamide Derivatives Other (See Comments)    headache    History of Present Illness      77 y.o. y/o male with the above past medical history including CAD, hypertension, hyperlipidemia, and ulcerative colitis.  He previously underwent diagnostic catheterization in July 2021 showing chronic total occlusions of the mid right coronary artery and first diagonal with otherwise moderate, nonobstructive disease involving the LAD and OM1.  EF was 65% by ventriculography, and he was medically managed.   Billy Kim was last seen in cardiology clinic in May 2024 at which time he reported a single episode of stabbing lower chest and upper abdominal discomfort that occurred  a month prior, with partial relief with nitroglycerin.  He also reported occasional episodes of a chill like sensation and tingling through his body.  Arrangements were made for 2D echocardiogram and lab work.  Unfortunately, patient was admitted to [facility] in July 25, 2022 due to word finding and gait  instability.  CT of his head showed acute intraparenchymal hemorrhage in the left thalamus without midline shift or mass effect.  CT angiogram of the head showed no aneurysm or vascular malformation.  MRI showed no mass or unexpected findings.  He was seen by neurosurgery and neurology with recommendation for conservative care.  2D echocardiogram was carried out and showed normal LV function with grade 1 diastolic dysfunction, RVSP of 24.4 mmHg, and no significant valvular disease.  He was subsequently transferred to inpatient rehab where he stayed for 1 week and was discharged home on June 18.  Recent carotid ultrasound showed mild, 1 to 39% bilateral internal carotid artery stenoses without any significant disease.  Since his hospital discharge, he has felt reasonably well.  He still has some residual right-sided weakness and difficulty finding words but overall, things have been improving.  His wife is present with him today.  He has follow-up with [facility] clinic neurology in 2 weeks.  He is working regularly with PT, OT, and speech.  He has not had any chest pain and denies dyspnea, palpitations, PND, orthopnea, dizziness, syncope, or early satiety.  He occasionally notes swelling in his ankles at the end of the day, and admits to significant reduction in his usual activity with worsening than usual.  Home Medications    Current Outpatient Medications  Medication Sig Dispense Refill   acetaminophen (TYLENOL) 325 MG tablet Take 2 tablets (650 mg total) by mouth every 4 (four) hours as needed for mild pain (or temp > 37.5 C (99.5 F)).     Ascorbic Acid (VITAMIN C) 1000 MG tablet Take 1,000 mg by mouth daily.     carvedilol (COREG) 3.125 MG tablet Take 1 tablet (3.125 mg total) by mouth 2 (two) times daily with a meal. 60 tablet 0   cholecalciferol (VITAMIN D3) 25 MCG (1000 UNIT) tablet Take 2 tablets (2,000 Units total) by mouth daily. 30 tablet 0   ezetimibe (ZETIA) 10 MG tablet Take 1 tablet (10 mg  total) by mouth daily. 90 tablet 3   fluticasone (FLONASE) 50 MCG/ACT nasal spray Place 2 sprays into both nostrils daily. 16 g 0   icosapent Ethyl (VASCEPA) 1 g capsule Take 2 capsules (2 g total) by mouth 2 (two) times daily. 120 capsule 3   isosorbide mononitrate (IMDUR) 120 MG 24 hr tablet Take 1 tablet (120 mg total) by mouth daily. 30 tablet 0   losartan (COZAAR) 50 MG tablet Take 1 tablet (50 mg total) by mouth daily. 90 tablet 3   meclizine (ANTIVERT) 25 MG tablet Take 0.5-1 tablet (12.5 mg- 25 mg) by mouth every 6 hours as needed for dizziness 25 tablet 0   nitroGLYCERIN (NITROSTAT) 0.4 MG SL tablet Place 1 tablet (0.4 mg total) under the tongue every 5 (five) minutes as needed for chest pain. Maximum of 3 doses. 25 tablet 1   lidocaine (LIDODERM) 5 % Place 2 patches onto the skin daily. Remove & Discard patch within 12 hours or as directed by MD (Patient not taking: Reported on 08/22/2022) 30 patch 0   omeprazole (PRILOSEC) 20 MG capsule Take 20 mg by mouth daily as needed. (Patient not taking: Reported on 08/22/2022)  No current facility-administered medications for this visit.     Review of Systems    Ongoing, mild right sided weakness and occasional difficulty finding words, especially when trying to speak quickly.  Occasional ankle swelling at the end of the day which resolves by keeping his legs elevated or by using a tighter sock.  He denies chest pain, palpitations, dyspnea, PND, orthopnea, dizziness, syncope, or early satiety.  All other systems reviewed and are otherwise negative except as noted above.    Physical Exam    VS:  BP 130/86 (BP Location: Left Arm, Patient Position: Sitting, Cuff Size: Normal)   Pulse 64   Ht 5\' 7"  (1.702 m)   Wt 179 lb 6.4 oz (81.4 kg)   SpO2 97%   BMI 28.10 kg/m  , BMI Body mass index is 28.1 kg/m.     GEN: Well nourished, well developed, in no acute distress. HEENT: normal. Neck: Supple, no JVD, carotid bruits, or masses. Cardiac: RRR,  1/6 systolic murmur at the upper sternal borders and along the left sternal border. No clubbing, cyanosis, edema.  Radials 2+/PT 2+ and equal bilaterally.  Respiratory:  Respirations regular and unlabored, clear to auscultation bilaterally. GI: Soft, nontender, nondistended, BS + x 4. MS: no deformity or atrophy. Skin: warm and dry, no rash. Neuro:  Strength and sensation are intact. Psych: Normal affect.  Accessory Clinical Findings     Lab Results  Component Value Date   WBC 6.2 08/21/2022   HGB 14.6 08/21/2022   HCT 42.4 08/21/2022   MCV 87.1 08/21/2022   PLT 126.0 (L) 08/21/2022   Lab Results  Component Value Date   CREATININE 1.21 08/21/2022   BUN 12 08/21/2022   NA 141 08/21/2022   K 4.5 08/21/2022   CL 106 08/21/2022   CO2 29 08/21/2022   Lab Results  Component Value Date   ALT 31 08/01/2022   AST 25 08/01/2022   ALKPHOS 63 08/01/2022   BILITOT 1.6 (H) 08/01/2022   Lab Results  Component Value Date   CHOL 148 07/26/2022   HDL 29 (L) 07/26/2022   LDLCALC 41 07/26/2022   LDLDIRECT 62.0 11/08/2021   TRIG 388 (H) 07/26/2022   CHOLHDL 5.1 07/26/2022    Lab Results  Component Value Date   HGBA1C 5.3 07/26/2022   Lab Results  Component Value Date   TSH 5.247 (H) 07/20/2022    Assessment & Plan    1.  Coronary artery disease: Moderate, nonobstructive LAD disease on catheterization in 2021 with small vessel chronic total occlusions of the first diagonal and RCA.  He had episode of atypical chest pain back in the spring with recent echo showing normal LV function and no significant valvular disease.  No recurrent symptoms.  He is working with PT and OT in the setting of recent intracerebral hemorrhage, and is tolerating rehabilitation well without chest pain or dyspnea.  He is now off of aspirin in the setting of intracerebral hemorrhage with plans for neurology follow-up.  He remains on beta-blocker, Zetia, Vascepa, nitrate, and ARB therapy.  2.  Hemorrhagic  stroke/intraparenchymal hemorrhage in the left thalamus: Admission in June with word finding and gait instability.  Found to have intraparenchymal hemorrhage in the left thalamus.  This was able to be managed conservatively and he subsequently spent a week at inpatient rehab at [facility].  He is now working with PT, OT, and speech, and tolerating rehabilitation well.  Still with some mild right-sided weakness word finding but overall, his  wife has noted improvement and patient has generally been feeling well.  He plans to follow-up with [facility] neurology.  He remains off of ASA.  3.  Hyperlipidemia/hypertriglyceridemia: Prior statin intolerance secondary to myalgias.  He remains on Vascepa and Zetia.  LDL of 41 in June with triglycerides of 388.  4.  Essential hypertension: Blood pressure stable today 130/86 on beta-blocker, ARB, and nitrate.  5.  Dependent edema: Patient occasionally notes very mild swelling in his ankles at the end of the day, ever since his stroke.  This is improved with keeping his legs elevated with a taller/tighter sock.  He is euvolemic on examination with no evidence of edema.  Encouraged him to avoid processed foods, keep legs elevated when appropriate, and consider wearing a tall sock.  Echo reassuring.  6.  Disposition: Patient follows up with neurology before the end of the month.  He has a follow-up appoint with Dr. Okey Dupre in September.  Informed Consent    Nicolasa Ducking, NP 08/30/2022, 12:16 PM

## 2022-08-30 NOTE — Patient Instructions (Signed)
Medication Instructions:  Your physician recommends that you continue on your current medications as directed. Please refer to the Current Medication list given to you today.  *If you need a refill on your cardiac medications before your next appointment, please call your pharmacy*  Lab Work: None ordered If you have labs (blood work) drawn today and your tests are completely normal, you will receive your results only by: MyChart Message (if you have MyChart) OR A paper copy in the mail If you have any lab test that is abnormal or we need to change your treatment, we will call you to review the results.  Follow-Up: At [facility], you and your health needs are our priority.  As part of our continuing mission to provide you with exceptional heart care, we have created designated Provider Care Teams.  These Care Teams include your primary Cardiologist (physician) and Advanced Practice Providers (APPs -  Physician Assistants and Nurse Practitioners) who all work together to provide you with the care you need, when you need it.  We recommend signing up for the patient portal called "MyChart".  Sign up information is provided on this After Visit Summary.  MyChart is used to connect with patients for Virtual Visits (Telemedicine).  Patients are able to view lab/test results, encounter notes, upcoming appointments, etc.  Non-urgent messages can be sent to your provider as well.   To learn more about what you can do with MyChart, go to [email or web address].    Your next appointment:   11/01/22 at 8:20 AM  Provider:   Yvonne Kendall, MD

## 2022-08-31 ENCOUNTER — Ambulatory Visit: Payer: PPO | Admitting: Speech Pathology

## 2022-08-31 ENCOUNTER — Ambulatory Visit: Payer: PPO

## 2022-08-31 DIAGNOSIS — I619 Nontraumatic intracerebral hemorrhage, unspecified: Secondary | ICD-10-CM

## 2022-08-31 DIAGNOSIS — R4701 Aphasia: Secondary | ICD-10-CM

## 2022-08-31 DIAGNOSIS — M6281 Muscle weakness (generalized): Secondary | ICD-10-CM | POA: Diagnosis not present

## 2022-08-31 NOTE — Therapy (Signed)
OUTPATIENT SPEECH LANGUAGE PATHOLOGY  TREATMENT NOTE   Patient Name: Billy Kim MRN: [id number] DOB:[birth year], 77 y.o., male Today's Date: 08/31/2022  PCP: Crawford Givens, MD REFERRING PROVIDER: Claudette Laws, MD   End of Session - 08/31/22 0827     Visit Number 3    Number of Visits 25    Date for SLP Re-Evaluation 11/07/22    Authorization Type [employer]    Progress Note Due on Visit 10    SLP Start Time 0800    SLP Stop Time  0845    SLP Time Calculation (min) 45 min    Activity Tolerance Patient tolerated treatment well             No past medical history on file. The histories are not reviewed yet. Please review them in the "History" navigator section and refresh this SmartLink. Patient Active Problem List   Diagnosis Date Noted   Intraparenchymal hemorrhage of brain (HCC) 07/31/2022   AKI (acute kidney injury) (HCC) 07/27/2022   Hypertensive emergency 07/27/2022   Nontraumatic subcortical hemorrhage of left cerebral hemisphere (HCC) 07/25/2022   Weakness 07/20/2022   Leg swelling 07/20/2022   Dizziness 11/03/2021   Abscess 08/07/2021   Actinic keratosis 08/07/2021   Melanocytic nevi of trunk 08/07/2021   Nevus of back 08/07/2021   Personal history of other malignant neoplasm of skin 08/07/2021   Medicare annual wellness visit, subsequent 11/09/2020   Coronary artery disease of native artery of native heart with stable angina pectoris (HCC) 12/17/2019   Mixed hyperlipidemia 12/17/2019   Accelerating angina (HCC) 12/11/2019   Shingles 03/01/2019   FH: prostate cancer 02/21/2017   Advance care planning 03/31/2014   PSA elevation 03/27/2013   Thrombocytopenia, unspecified (HCC) 03/27/2013   Acute cough 03/12/2013   ED (erectile dysfunction) 12/28/2011   Neoplasm of uncertain behavior of skin 12/27/2010   Exertional chest pain 01/05/2010   Essential hypertension 04/08/2007   HYPERTRIGLYCERIDEMIA 01/08/2007   GILBERT'S SYNDROME 01/08/2007    ULCERATIVE COLITIS 01/08/2007    ONSET DATE: 07/25/2022   REFERRING DIAG: I61.9 (ICD-10-CM) - Intraparenchymal hemorrhage of brain (HCC)   THERAPY DIAG:  Aphasia  Intraparenchymal hemorrhage of brain (HCC)  Rationale for Evaluation and Treatment Rehabilitation  SUBJECTIVE:   PERTINENT HISTORY and DIAGNOSTIC FINDINGS: Pt is a right handed 77 year old male with past medical history of HLD, HTN, CAD with recent intra parenchymal hemorrhage on 07/25/2022. MRI (07/26/2022) revealed "known acute hemorrhage in the left thalamus measuring up to 2 cm and chronic small vessel ischemia with chronic microhemorrhages."   PAIN:  Are you having pain? No   FALLS: Has patient fallen in last 6 months?  No  LIVING ENVIRONMENT: Lives with: lives with their spouse Lives in: House/apartment  PLOF:  Level of assistance: Independent with ADLs, Independent with IADLs Employment: Full-time employment   PATIENT GOALS   to get better  SUBJECTIVE STATEMENT: Pt with increased smiling, interaction, laughing Pt accompanied by: significant other who remained in the lobby  OBJECTIVE:   TODAY'S TREATMENT:  Skilled ST session focused on pt's cognitive communication goals. SLP facilitated the session by providing the following interventions:  Pt benefited from moderate assistance to provide semantic description of semi-complex target words and minimal assistance for word finding based on SLP description. Suspect word finding difficulties related to decreased understanding of task.   PATIENT EDUCATION: Education details: see above Person educated: Patient and Spouse Education method: Explanation Education comprehension: needs further education  HOME EXERCISE PROGRAM:  Engage with his wife concerning conversation starters  Read out loud  GOALS:  Goals reviewed with patient? Yes  SHORT TERM GOALS: Target date: 10 sessions  With Min A, patient will describe visual scenes using 3 or more  sentences at 95% accuracy. Baseline: Goal status: INITIAL   2.  Pt will report improved cognitive communication via PROM by 5 points at last ST session    Baseline:  Goal status: INITIAL  3.   With Min A, patient will recall functional verbal information 80% accuracy with use of compensations/strategies (repeats, rephrasing, notetaking, etc).   Baseline:  Goal status: INITIAL  4.  With Min A, patient will generate sentences with 3 or more words in response to a situation at 95% accuracy in order to increase ability to communicate basic wants and needs.    Baseline:  Goal status: INITIAL  LONG TERM GOALS: Target date: 11/07/2022  With Supervision A, patient will participate in complex conversation at 90% accuracy to increase ability to communicate complex thoughts, feelings, and needs.  Baseline:  Goal status: INITIAL  2.   With Supervision A, patient will demonstrate strategies for processing/recall (eg notetaking, paraphrasing) successfully by recalling >90% of details from 15 minutes verbal information.   Baseline:  Goal status: INITIAL  ASSESSMENT:  CLINICAL IMPRESSION: Patient is a 77 y.o. right handed male who was seen today for cognitive communication treatment session d/t recent left thalamic hemorrhage.  Pt with great improvement over previous evaluation (~ 2 weeks prior). Pt with increased pragmatics and verbal expression. See above treatment note for details.   OBJECTIVE IMPAIRMENTS include memory, executive functioning, expressive language, and aphasia. These impairments are limiting patient from return to work, managing medications, managing appointments, managing finances, household responsibilities, ADLs/IADLs, and effectively communicating at home and in community. Factors affecting potential to achieve goals and functional outcome are  N/A .Marland Kitchen Patient will benefit from skilled SLP services to address above impairments and improve overall function.  REHAB POTENTIAL:  Excellent  PLAN: SLP FREQUENCY: 1-2x/week  SLP DURATION: 12 weeks  PLANNED INTERVENTIONS: Language facilitation, Cueing hierachy, Cognitive reorganization, Internal/external aids, Functional tasks, SLP instruction and feedback, Compensatory strategies, and Patient/family education   Abeer Iversen B. Dreama Saa, M.S., CCC-SLP, Tree surgeon Certified Brain Injury Specialist [facility]  [facility] Rehabilitation Services Office [phone] Ascom [phone] Fax [phone]

## 2022-09-03 ENCOUNTER — Ambulatory Visit: Payer: PPO | Admitting: Physical Therapy

## 2022-09-03 ENCOUNTER — Ambulatory Visit: Payer: PPO | Admitting: Occupational Therapy

## 2022-09-03 ENCOUNTER — Ambulatory Visit: Payer: PPO | Admitting: Speech Pathology

## 2022-09-03 DIAGNOSIS — M6281 Muscle weakness (generalized): Secondary | ICD-10-CM

## 2022-09-03 DIAGNOSIS — R262 Difficulty in walking, not elsewhere classified: Secondary | ICD-10-CM

## 2022-09-03 DIAGNOSIS — R4701 Aphasia: Secondary | ICD-10-CM

## 2022-09-03 DIAGNOSIS — R278 Other lack of coordination: Secondary | ICD-10-CM

## 2022-09-03 DIAGNOSIS — R2681 Unsteadiness on feet: Secondary | ICD-10-CM

## 2022-09-03 DIAGNOSIS — R2689 Other abnormalities of gait and mobility: Secondary | ICD-10-CM

## 2022-09-03 DIAGNOSIS — I619 Nontraumatic intracerebral hemorrhage, unspecified: Secondary | ICD-10-CM

## 2022-09-03 DIAGNOSIS — R41841 Cognitive communication deficit: Secondary | ICD-10-CM

## 2022-09-03 NOTE — Therapy (Signed)
OUTPATIENT PHYSICAL THERAPY NEURO TREATMENT  Patient Name: Billy Kim MRN: [id number] DOB:April 18, 1945, 77 y.o., male Today's Date: 09/03/2022   PCP: Joaquim Nam, MD  REFERRING PROVIDER: Joaquim Nam, MD   END OF SESSION:  PT End of Session - 09/03/22 0756     Visit Number 4    Number of Visits 16    Date for PT Re-Evaluation 10/10/22    Progress Note Due on Visit 10    PT Start Time 0845    PT Stop Time 0926    PT Time Calculation (min) 41 min    Equipment Utilized During Treatment Gait belt    Activity Tolerance Patient tolerated treatment well    Behavior During Therapy [facility] for tasks assessed/performed             Past Medical History:  Diagnosis Date   Coronary artery disease    a. 11/2019 Cath: Moderate proximal LAD disease (not hemodynamically significant) and CTO's of D1 and non-dominant RCA.   HLD (hyperlipidemia)    Hypertension    Intracerebral hemorrhage (HCC)    a. 07/2022 CT Head: acute intraparenchymal hemorrhage in the left thalamus without midline shift or mass effect.   Systolic murmur    a. 07/2022 Echo: EF 60-65%, no rwma, GrI DD, nl RV fxn, RVSP 15.31mmHg, no significant valvular disease.   Ulcerative colitis 06/1979   Remission for years   Past Surgical History:  Procedure Laterality Date   CARDIAC CATHETERIZATION  03/20/01   Cardiolite EF 55% 02/10/02   CHOLECYSTECTOMY N/A 07/09/2017   Procedure: LAPAROSCOPIC CHOLECYSTECTOMY;  Surgeon: Jimmye Norman, MD;  Location: [facility] OR;  Service: General;  Laterality: N/A;   COLONOSCOPY  multiple   ENDOSCOPIC RETROGRADE CHOLANGIOPANCREATOGRAPHY (ERCP) WITH PROPOFOL N/A 07/08/2017   Procedure: ENDOSCOPIC RETROGRADE CHOLANGIOPANCREATOGRAPHY (ERCP) WITH PROPOFOL;  Surgeon: Lynann Bologna, MD;  Location: [facility] ENDOSCOPY;  Service: Endoscopy;  Laterality: N/A;   INGUINAL HERNIA REPAIR  04/09/06   Bilateral   LAPAROSCOPIC APPENDECTOMY  03/1981   LEFT HEART CATH AND CORONARY ANGIOGRAPHY Left 12/11/2019    Procedure: LEFT HEART CATH AND CORONARY ANGIOGRAPHY;  Surgeon: Yvonne Kendall, MD;  Location: [facility] INVASIVE CV LAB;  Service: Cardiovascular;  Laterality: Left;   REMOVAL OF STONES  07/08/2017   Procedure: REMOVAL OF STONES;  Surgeon: Lynann Bologna, MD;  Location: [facility] ENDOSCOPY;  Service: Endoscopy;;   SPHINCTEROTOMY  07/08/2017   Procedure: Dennison Mascot;  Surgeon: Lynann Bologna, MD;  Location: [facility] ENDOSCOPY;  Service: Endoscopy;;   Patient Active Problem List   Diagnosis Date Noted   Intraparenchymal hemorrhage of brain (HCC) 07/31/2022   AKI (acute kidney injury) (HCC) 07/27/2022   Hypertensive emergency 07/27/2022   Nontraumatic subcortical hemorrhage of left cerebral hemisphere (HCC) 07/25/2022   Weakness 07/20/2022   Leg swelling 07/20/2022   Dizziness 11/03/2021   Abscess 08/07/2021   Actinic keratosis 08/07/2021   Melanocytic nevi of trunk 08/07/2021   Nevus of back 08/07/2021   Personal history of other malignant neoplasm of skin 08/07/2021   Medicare annual wellness visit, subsequent 11/09/2020   Coronary artery disease of native artery of native heart with stable angina pectoris (HCC) 12/17/2019   Mixed hyperlipidemia 12/17/2019   Accelerating angina (HCC) 12/11/2019   Shingles 03/01/2019   FH: prostate cancer 02/21/2017   Advance care planning 03/31/2014   PSA elevation 03/27/2013   Thrombocytopenia, unspecified (HCC) 03/27/2013   Acute cough 03/12/2013   ED (erectile dysfunction) 12/28/2011   Neoplasm of uncertain behavior of skin 12/27/2010   Exertional  chest pain 01/05/2010   Essential hypertension 04/08/2007   HYPERTRIGLYCERIDEMIA 01/08/2007   GILBERT'S SYNDROME 01/08/2007   ULCERATIVE COLITIS 01/08/2007    ONSET DATE: 07/25/2022  REFERRING DIAG: Intraparenchymal hemorrhage of brain (HCC)   THERAPY DIAG:  Muscle weakness (generalized)  Other lack of coordination  Other abnormalities of gait and mobility  Unsteadiness on feet  Difficulty in walking,  not elsewhere classified  Rationale for Evaluation and Treatment: Rehabilitation  SUBJECTIVE:                                                                                                                                                                                             SUBJECTIVE STATEMENT:  Pt reports that he is doing well. No issues over the weekend. Reports that he ha da very good weekend, was able to go see family in [place].     Pt accompanied by: self  PERTINENT HISTORY:  Per acute care PT evaluation on 07/27/2022: "Prior to admission pt was independent, working in [employer], and driving. On this date, pt presents with expressive communication deficits, R inattention, R hemiparesis, and impaired balance & gait. Pt requires min assist to ambulate and decreased ability to correct gait pattern to reduce fall risk. Pt with R inattention, bumping doorway x 2 despite PT educating throughout session."  Per IPR note leading to discharge on 08/04/2022:  -pt currently amb. 400-500 ft at SBA -previous FGA scoring at 21/30 staging at a medium fall risk -working on dynamic gait/balance and R LE foot clearance    PAIN:  Are you having pain? No  PRECAUTIONS: Fall  WEIGHT BEARING RESTRICTIONS: No  FALLS: Has patient fallen in last 6 months? No  LIVING ENVIRONMENT: Lives with: lives with their spouse Lives in: House/apartment Stairs: Yes: Internal: 14 steps; on right going up and External: 4 steps; on right going up, on left going up, and can reach both Has following equipment at home: None  PLOF: Independent  PATIENT GOALS: to walk straight, work on memory  OBJECTIVE:   DIAGNOSTIC FINDINGS:  CT ANGIOGRAPHY HEAD on 06/05: IMPRESSION: 1. Normal CTA of the head.  No aneurysm or vascular malformation. 2. Unchanged appearance of left thalamic intraparenchymal hematoma, most consistent with hypertensive hemorrhage.   MRI HEAD WITHOUT AND WITH CONTRAST on 06/06: IMPRESSION:  1.  No mass or other unexpected finding underlying the patient's left thalamic hematoma. 2. Chronic small vessel ischemia with chronic microhemorrhages.   COGNITION: Overall cognitive status: Impaired, as it relates slowed cognitive processes.  COORDINATION: WFL   LOWER EXTREMITY MMT:    MMT Right Eval Left Eval  Hip flexion 4 4  Hip extension    Hip abduction 4 4  Hip adduction 4 4  Hip internal rotation    Hip external rotation    Knee flexion 4- 4-  Knee extension 4+ 4+  Ankle dorsiflexion 4- 4-  Ankle plantarflexion    Ankle inversion    Ankle eversion    (Blank rows = not tested)  TRANSFERS: Assistive device utilized: None  Sit to stand: SBA Stand to sit: SBA Floor:  possible assessment 2nd session  STAIRS: Level of Assistance: Modified independence Stair Negotiation Technique: Alternating Pattern  with Single Rail on Right Number of Stairs: 4  Height of Stairs: Standard  Comments: Use of hand rail going up, no use descending but was cautious.  GAIT: Gait pattern:  R sided drift and decreased arm swing- Right Distance walked: 942 Assistive device utilized: None Level of assistance: CGA  FUNCTIONAL TESTS:  Timed up and go (TUG): 8.92 sec 6 minute walk test: 942 feet 10 meter walk test: 12.37 sec,  Functional gait assessment: 26; see details below    PATIENT SURVEYS:  FOTO 73  TODAY'S TREATMENT:                                                                                                                              DATE: 09/03/22  Nustep level 4-5 x 5 min with cues for increased speed>50spm and full ROM in BLE .    Eyes open/eyes closed with narrow BOS x 30 sec each x 3 bout s Tandem stance x 15 sce bil with intermittent UE support for safety x 3 bouts  SLS x 15 sec bil with intermittent UE support x 3 bouts   Side stepping with RTB x 23ft bil x 4 Forward/reverse AROM, 30ft x 2 and with RTB 24ft each x 3   Standing 1 foot on airex pad with  other foot on airex pad on 6inch step 2 x 15 bil then perform contralateral cross body reach x 10 bil   Resisted gait in matrix cable machine x 12.5 lbs x 3 with 2 instances of min assist to prevent LOB with posterior return to machine.   Throughout session PT provided supervision assist-CGA for safety with cues for use of hip and stepping strategy to correct LOB.       PATIENT EDUCATION: Education details: Educated on instruction for tests and measures, outcome measures, and POC.  Person educated: Patient Education method: Medical illustrator Education comprehension: verbalized understanding  HOME EXERCISE PROGRAM: Access Code: [email or web address] URL: [email or web address] Date: 09/03/2022 Prepared by: Grier Rocher  Exercises - Standing Single Leg Stance with Counter Support  - 1 x daily - 7 x weekly - 3 sets - 4 reps - 15 hold - Romberg Stance with Eyes Closed  - 1 x daily - 7 x weekly - 3 sets - 3 reps - 15 hold - Standing Tandem Balance with Counter Support  - 1 x daily - 7 x weekly -  3 sets - 4 reps - 15 hold   GOALS: Goals reviewed with patient? No  SHORT TERM GOALS: Target date: 09/12/2022      Patient will be independent in home exercise program to improve strength/mobility for better functional independence with ADLs. Baseline: No HEP currently  Goal status: INITIAL     LONG TERM GOALS: Target date: 10/10/2022     1.  Patient will increase FOTO score to equal to or greater than  80   to demonstrate statistically significant improvement in mobility and quality of life.  Baseline: 73 Goal status: INITIAL   2.  Patient will increase FGA score by > 4 points to demonstrate decreased fall risk during functional activities. Baseline: 26 Goal status: INITIAL    3.   Patient will increase 10 meter walk test to >1.53m/s as to improve gait speed for better community ambulation and to reduce fall risk. Baseline: 12.37 sec, .81 m/s Goal status:  INITIAL  4.   Patient will increase six minute walk test distance to >1200 for progression to advanced community ambulator and improve gait ability Baseline: 942 feet Goal status: INITIAL  5.   Patient will successfully navigate a simulated community environment/obstacle course while successfully attending to R side obstacles without cueing from therapist. Baseline: assess 2nd session Goal status: INITIAL 6.   Patient will display an increase of LE functional strength of 4+/5 or better for various workplace tasks by carrying weighted objects greater distances without rest breaks, no LOB or any level of assist form therapist. Baseline:  assess 2nd session Goal status: INITIAL 7.   Patient will show increased safety with floor transfers by showing consistent and proper transfer mechanics at Modified independent assist or better provided by therapist. Baseline: assess 2nd session Goal status: INITIAL   ASSESSMENT:  CLINICAL IMPRESSION: Patient presented with excellent motivation for today's treatment. PT provided pt with balance HEP to maximize improved return to functional and safety with ADLs as listed above. Pt demonstrated mild LOB and weakness in the R hip with dynamic resisted gait requiring cues for proper step width and use of stepping straget yto correct LOB. Pt will continue to benefit from skilled physical therapy intervention to address impairments, improve QOL, and attain therapy goals.    OBJECTIVE IMPAIRMENTS: Abnormal gait, decreased balance, decreased cognition, and decreased endurance.   ACTIVITY LIMITATIONS: carrying, lifting, squatting, and locomotion level  PARTICIPATION LIMITATIONS: driving, community activity, and occupation  PERSONAL FACTORS: Age, Past/current experiences, and Time since onset of injury/illness/exacerbation are also affecting patient's functional outcome.   REHAB POTENTIAL: Good  CLINICAL DECISION MAKING: Stable/uncomplicated  EVALUATION  COMPLEXITY: Low  PLAN:  PT FREQUENCY: 1-2x/week  PT DURATION: 8 weeks  PLANNED INTERVENTIONS: Therapeutic exercises, Therapeutic activity, Neuromuscular re-education, Balance training, Gait training, Patient/Family education, Self Care, Joint mobilization, Stair training, Cryotherapy, Moist heat, and Re-evaluation  PLAN FOR NEXT SESSION:   Resisted gait training.  High level dynamic balance and gait training.   Golden Pop, PT 09/03/2022, 9:45 AM  Physical Therapist - [facility]  9:45 AM 09/03/22

## 2022-09-03 NOTE — Therapy (Signed)
OUTPATIENT SPEECH LANGUAGE PATHOLOGY  TREATMENT NOTE   Patient Name: Billy Kim MRN: [id number] DOB:[birth year], 77 y.o., male Today's Date: 09/03/2022  PCP: Crawford Givens, MD REFERRING PROVIDER: Claudette Laws, MD   End of Session - 09/03/22 0946     Visit Number 4    Number of Visits 25    Date for SLP Re-Evaluation 11/07/22    Authorization Type [employer]    Progress Note Due on Visit 10    SLP Start Time 0930    SLP Stop Time  1015    SLP Time Calculation (min) 45 min    Activity Tolerance Patient tolerated treatment well             No past medical history on file. The histories are not reviewed yet. Please review them in the "History" navigator section and refresh this SmartLink. Patient Active Problem List   Diagnosis Date Noted   Intraparenchymal hemorrhage of brain (HCC) 07/31/2022   AKI (acute kidney injury) (HCC) 07/27/2022   Hypertensive emergency 07/27/2022   Nontraumatic subcortical hemorrhage of left cerebral hemisphere (HCC) 07/25/2022   Weakness 07/20/2022   Leg swelling 07/20/2022   Dizziness 11/03/2021   Abscess 08/07/2021   Actinic keratosis 08/07/2021   Melanocytic nevi of trunk 08/07/2021   Nevus of back 08/07/2021   Personal history of other malignant neoplasm of skin 08/07/2021   Medicare annual wellness visit, subsequent 11/09/2020   Coronary artery disease of native artery of native heart with stable angina pectoris (HCC) 12/17/2019   Mixed hyperlipidemia 12/17/2019   Accelerating angina (HCC) 12/11/2019   Shingles 03/01/2019   FH: prostate cancer 02/21/2017   Advance care planning 03/31/2014   PSA elevation 03/27/2013   Thrombocytopenia, unspecified (HCC) 03/27/2013   Acute cough 03/12/2013   ED (erectile dysfunction) 12/28/2011   Neoplasm of uncertain behavior of skin 12/27/2010   Exertional chest pain 01/05/2010   Essential hypertension 04/08/2007   HYPERTRIGLYCERIDEMIA 01/08/2007   GILBERT'S SYNDROME 01/08/2007    ULCERATIVE COLITIS 01/08/2007    ONSET DATE: 07/25/2022   REFERRING DIAG: I61.9 (ICD-10-CM) - Intraparenchymal hemorrhage of brain (HCC)   THERAPY DIAG:  Aphasia  Intraparenchymal hemorrhage of brain (HCC)  Cognitive communication deficit  Rationale for Evaluation and Treatment Rehabilitation  SUBJECTIVE:   PERTINENT HISTORY and DIAGNOSTIC FINDINGS: Pt is a right handed 77 year old male with past medical history of HLD, HTN, CAD with recent intra parenchymal hemorrhage on 07/25/2022. MRI (07/26/2022) revealed "known acute hemorrhage in the left thalamus measuring up to 2 cm and chronic small vessel ischemia with chronic microhemorrhages."   PAIN:  Are you having pain? No   FALLS: Has patient fallen in last 6 months?  No  LIVING ENVIRONMENT: Lives with: lives with their spouse Lives in: House/apartment  PLOF:  Level of assistance: Independent with ADLs, Independent with IADLs Employment: Full-time employment   PATIENT GOALS   to get better  SUBJECTIVE STATEMENT: Pt with increased smiling, interaction, laughing Pt accompanied by: significant other who remained in the lobby  OBJECTIVE:   TODAY'S TREATMENT:  Skilled ST session focused on pt's cognitive communication goals. SLP facilitated the session by providing the following interventions:  SLP trained semantic feature analysis (SFA) with visual aid. Initial training with SLP provided x5 words related to work tasks, patient provided x3 features with moderate contextual/questions cues to verbalize features. Improved carry over of task as patient provided x3 features with moderate cues in 75% of x8 opportunities. 50% (4/8) word opportunities patient IND provided 2  of 3 features for word finding and 25%  (2/8) patient IND provided 1/3 features. Patient improved to provided total x3 features in all opportunities with moderate assistance via questions and visual cues to provide a faciliator (feature) word for word finding. SLP  modeled SFA and provided x3 features for patient to complete word finding--100% accuracy demonstrated.   PATIENT EDUCATION: Education details: see above Person educated: Patient and Spouse Education method: Explanation Education comprehension: needs further education  HOME EXERCISE PROGRAM:        Read out loud  Complete semantic feature analysis    GOALS:  Goals reviewed with patient? Yes  SHORT TERM GOALS: Target date: 10 sessions  With Min A, patient will describe visual scenes using 3 or more sentences at 95% accuracy. Baseline: Goal status: INITIAL   2.  Pt will report improved cognitive communication via PROM by 5 points at last ST session    Baseline:  Goal status: INITIAL  3.   With Min A, patient will recall functional verbal information 80% accuracy with use of compensations/strategies (repeats, rephrasing, notetaking, etc).   Baseline:  Goal status: INITIAL  4.  With Min A, patient will generate sentences with 3 or more words in response to a situation at 95% accuracy in order to increase ability to communicate basic wants and needs.    Baseline:  Goal status: INITIAL  LONG TERM GOALS: Target date: 11/07/2022  With Supervision A, patient will participate in complex conversation at 90% accuracy to increase ability to communicate complex thoughts, feelings, and needs.  Baseline:  Goal status: INITIAL  2.   With Supervision A, patient will demonstrate strategies for processing/recall (eg notetaking, paraphrasing) successfully by recalling >90% of details from 15 minutes verbal information.   Baseline:  Goal status: INITIAL  ASSESSMENT:  CLINICAL IMPRESSION: Patient is a 77 y.o. right handed male who was seen today for cognitive communication treatment session d/t recent left thalamic hemorrhage.  Pt with great improvement over previous evaluation (~ 2 weeks prior). Pt with increased pragmatics and verbal expression. He responded well to semantic feature  analysis. See above treatment note for details.   OBJECTIVE IMPAIRMENTS include memory, executive functioning, expressive language, and aphasia. These impairments are limiting patient from return to work, managing medications, managing appointments, managing finances, household responsibilities, ADLs/IADLs, and effectively communicating at home and in community. Factors affecting potential to achieve goals and functional outcome are  N/A .Marland Kitchen Patient will benefit from skilled SLP services to address above impairments and improve overall function.  REHAB POTENTIAL: Excellent  PLAN: SLP FREQUENCY: 1-2x/week  SLP DURATION: 12 weeks  PLANNED INTERVENTIONS: Language facilitation, Cueing hierachy, Cognitive reorganization, Internal/external aids, Functional tasks, SLP instruction and feedback, Compensatory strategies, and Patient/family education   Cayce Quezada B. Dreama Saa, M.S., CCC-SLP, Tree surgeon Certified Brain Injury Specialist [facility]  [facility] Rehabilitation Services Office [phone] Ascom [phone] Fax [phone]

## 2022-09-03 NOTE — Therapy (Addendum)
OUTPATIENT OCCUPATIONAL THERAPY NEURO TREATMENT NOTE  Patient Name: Billy Kim MRN: [id number] DOB:[birth year], 77 y.o., male Today's Date: 09/03/2022  PCP: Joaquim Nam, MD REFERRING PROVIDER: Charlton Amor, PA-C  END OF SESSION:  OT End of Session - 09/03/22 0852     Visit Number 3    Number of Visits 12    Date for OT Re-Evaluation 11/14/22    OT Start Time 0802    OT Stop Time 0845    OT Time Calculation (min) 43 min    Activity Tolerance Patient tolerated treatment well    Behavior During Therapy [facility] for tasks assessed/performed             Past Medical History:  Diagnosis Date   Coronary artery disease    a. 11/2019 Cath: Moderate proximal LAD disease (not hemodynamically significant) and CTO's of D1 and non-dominant RCA.   HLD (hyperlipidemia)    Hypertension    Intracerebral hemorrhage (HCC)    a. 07/2022 CT Head: acute intraparenchymal hemorrhage in the left thalamus without midline shift or mass effect.   Systolic murmur    a. 07/2022 Echo: EF 60-65%, no rwma, GrI DD, nl RV fxn, RVSP 15.22mmHg, no significant valvular disease.   Ulcerative colitis 06/1979   Remission for years   Past Surgical History:  Procedure Laterality Date   CARDIAC CATHETERIZATION  03/20/01   Cardiolite EF 55% 02/10/02   CHOLECYSTECTOMY N/A 07/09/2017   Procedure: LAPAROSCOPIC CHOLECYSTECTOMY;  Surgeon: Jimmye Norman, MD;  Location: [facility] OR;  Service: General;  Laterality: N/A;   COLONOSCOPY  multiple   ENDOSCOPIC RETROGRADE CHOLANGIOPANCREATOGRAPHY (ERCP) WITH PROPOFOL N/A 07/08/2017   Procedure: ENDOSCOPIC RETROGRADE CHOLANGIOPANCREATOGRAPHY (ERCP) WITH PROPOFOL;  Surgeon: Lynann Bologna, MD;  Location[facility] ENDOSCOPY;  Service: Endoscopy;  Laterality: N/A;   INGUINAL HERNIA REPAIR  04/09/06   Bilateral   LAPAROSCOPIC APPENDECTOMY  03/1981   LEFT HEART CATH AND CORONARY ANGIOGRAPHY Left 12/11/2019   Procedure: LEFT HEART CATH AND CORONARY ANGIOGRAPHY;  Surgeon: Yvonne Kendall,  MD;  Location: [facility] INVASIVE CV LAB;  Service: Cardiovascular;  Laterality: Left;   REMOVAL OF STONES  07/08/2017   Procedure: REMOVAL OF STONES;  Surgeon: Lynann Bologna, MD;  Location: [facility] ENDOSCOPY;  Service: Endoscopy;;   SPHINCTEROTOMY  07/08/2017   Procedure: Dennison Mascot;  Surgeon: Lynann Bologna, MD;  Location: [facility] ENDOSCOPY;  Service: Endoscopy;;   Patient Active Problem List   Diagnosis Date Noted   Intraparenchymal hemorrhage of brain (HCC) 07/31/2022   AKI (acute kidney injury) (HCC) 07/27/2022   Hypertensive emergency 07/27/2022   Nontraumatic subcortical hemorrhage of left cerebral hemisphere (HCC) 07/25/2022   Weakness 07/20/2022   Leg swelling 07/20/2022   Dizziness 11/03/2021   Abscess 08/07/2021   Actinic keratosis 08/07/2021   Melanocytic nevi of trunk 08/07/2021   Nevus of back 08/07/2021   Personal history of other malignant neoplasm of skin 08/07/2021   Medicare annual wellness visit, subsequent 11/09/2020   Coronary artery disease of native artery of native heart with stable angina pectoris (HCC) 12/17/2019   Mixed hyperlipidemia 12/17/2019   Accelerating angina (HCC) 12/11/2019   Shingles 03/01/2019   FH: prostate cancer 02/21/2017   Advance care planning 03/31/2014   PSA elevation 03/27/2013   Thrombocytopenia, unspecified (HCC) 03/27/2013   Acute cough 03/12/2013   ED (erectile dysfunction) 12/28/2011   Neoplasm of uncertain behavior of skin 12/27/2010   Exertional chest pain 01/05/2010   Essential hypertension 04/08/2007   HYPERTRIGLYCERIDEMIA 01/08/2007   GILBERT'S SYNDROME 01/08/2007   ULCERATIVE  COLITIS 01/08/2007    ONSET DATE: 07/25/2022  REFERRING DIAG: Intraparenchymal hemorrhage of brain (HCC)  THERAPY DIAG:  Muscle weakness (generalized)  Other lack of coordination  Rationale for Evaluation and Treatment: Rehabilitation  SUBJECTIVE:   SUBJECTIVE STATEMENT: Pt. Reports that he is doing well today. Pt. Reports that he had a good  weekend and walked 2 blocks on Saturday. Pt accompanied by: self  PERTINENT HISTORY: Pt. Is a 77 y.o. male who has been diagnosed with a Intraparenchymal hemorrhage of the brain (HCC). Other dx include AKI, hypertension, hyperlipidemia, and history of CAD and ulcerative colitis. Pt. Presents with expressive communication deficits, R inattention, and R hemiparesis and impaired balance.   PRECAUTIONS: No driving WEIGHT BEARING RESTRICTIONS: No  PAIN:  Are you having pain? No  FALLS: Has patient fallen in last 6 months? No  LIVING ENVIRONMENT: Lives with: lives with their spouse Lives in: 2 story home Stairs: Yes: Internal: 14 steps; and External: 4 steps; can reach both Has following equipment at home: None  PLOF: Independent  PATIENT GOALS: Work on improving memory, and balance on the R side  OBJECTIVE:   HAND DOMINANCE: Right  ADLs:  Eating: Independent however holds utensils differently with the right hand, grabs handle with entire hand Grooming: Independent UB Dressing: Independent LB Dressing: Takes longer to put on pants due to balance, independent with tying shoes and putting socks on when sitting down Toileting: Independent Bathing: Tub shower, standing, holds onto wall to keep balance Tub Shower transfers: Independent, hold onto wall Equipment: none  IADLs: Shopping: Does with spouse, pushes cart to help balance,  Light housekeeping: Wife does most of it now, increased time due to being cautious to ensure he does not lose his balance Meal Prep: Wife prepares all meals, able to go to fridge to get out a drink Community mobility: Uses grocery cart to balance Medication management: Wife sets them out each day for him, pt. Takes them independently Financial management: Spouse handles finance Handwriting: 100% legible  MOBILITY STATUS: Independent however often has to hold onto things to ensure balance  POSTURE COMMENTS:  No Significant postural  limitations Sitting balance:  WFL  ACTIVITY TOLERANCE: Activity tolerance: WFL  FUNCTIONAL OUTCOME MEASURES: FOTO: Eval: 98 TR:98  (FOTO goal is not indicated at this time)  UPPER EXTREMITY ROM:    Active ROM Right WFL Left WFL  Shoulder flexion    Shoulder abduction    Shoulder adduction    Shoulder extension    Shoulder internal rotation    Shoulder external rotation    Elbow flexion    Elbow extension    Wrist flexion    Wrist extension    Wrist ulnar deviation    Wrist radial deviation    Wrist pronation    Wrist supination    (Blank rows = not tested)  UPPER EXTREMITY MMT:     MMT Right eval Left eval  Shoulder flexion 5/5 5/5  Shoulder abduction 5/5 5/5  Shoulder adduction    Shoulder extension    Shoulder internal rotation    Shoulder external rotation    Middle trapezius    Lower trapezius    Elbow flexion 5/5 5/5  Elbow extension 5/5 5/5  Wrist flexion 5/5 5/5  Wrist extension    Wrist ulnar deviation    Wrist radial deviation    Wrist pronation    Wrist supination    (Blank rows = not tested)  HAND FUNCTION: Grip strength: Right: 87 lbs; Left: 102 lbs,  Lateral pinch: Right: 21 lbs, Left: 21 lbs, and 3 point pinch: Right: 17 lbs, Left: 18 lbs  COORDINATION: 9 Hole Peg test: Right: 29 sec; Left: 25 sec  SENSATION: Light touch: WFL Stereognosis: WFL  EDEMA: None  MUSCLE TONE: None  COGNITION: Overall cognitive status: Impaired memory  VISION: Subjective report: Pt. Reports he has not noticed any vision changes. Baseline vision: Wears glasses all the time Visual history:  None  VISION ASSESSMENT: To be further assessed in functional context  PERCEPTION: TBD  PRAXIS: WFL  TODAY'S TREATMENT:                                                                                                                              DATE: 09/03/2022  Therapeutic Exercise:   Pt. facilitated R hand strengthening with use of hand gripper set at  28.9# for 1 trials to remove jumbo pegs from the peg board and place pegs back into container located at different heights.  Self Care/Home Management:  Pt. Worked on simulated medication management task. Pt. Was able to problem solve through setting up small pill box and large pill box following instructions with increased verbal cues to ensure proper set up. Pt. Worked on answering medication questionnaire while referring to information displayed on medication bottles.    PATIENT EDUCATION: Education details: Handout on theraputty exercises for R hand strengthening Person educated: Patient Education method: Explanation, Demonstration, and Verbal cues Education comprehension: verbalized understanding  HOME EXERCISE PROGRAM: Theraputty exercises for R hand strengthening    GOALS: Goals reviewed with patient? Yes  SHORT TERM GOALS: Target date: 10/03/2022    Pt. Will be independent with HEP for increasing R hand strength and coordination. Baseline: Currently no HEP Goal status: INITIAL  LONG TERM GOALS: Target date: 11/14/22  Pt. Will increase R grip strength by 5# to be able to securely hold and carry objects for ADL/IADL tasks.  Baseline: Grip strength: Right: 87 lbs; Left: 102 lbs Goal status: INITIAL  2.  Pt. Will improve R FMC skills by 2 seconds of speed to independently manipulate small objects during ADL/IADL tasks.  Baseline: Eval: 9 hole peg test: L: 25 seconds, R: 29 seconds Goal status: INITIAL  3.  Pt. Will demonstrate simulated pill box setup with supervision.  Baseline: Eval: Pts. Wife currently sets out medicine for Pt. To take, Pt. Has pill organizer but does not use it yet. Goal status: INITIAL  4.  Pt. Will independently demonstrate holding utensil with proper form to increase engagement with eating tasks.   Baseline: Pt. Currently uses full grasp to hold utensil while eating rather than  Goal status: INITIAL   ASSESSMENT:  CLINICAL IMPRESSION: Pt. Was  able to use hand gripper set to 28.9# to remove jumbo pegs from the peg board. Pt. was able to answer the questions located on the questionnaire while referring to information on the medication bottles however Pt. required increased time due to having to read  the questions multiple times to ensure he understood what it was asking. Pt. Required increased verbal cues throughout answering the questions to ensure the were referring to the right medication bottle. Pt. Was able to set up small and large pill box however Pt. Required increased verbal cues throughout to ensure pills were placed correctly and encouraged to read the instructions multiple times. Pt. Reported he felt this activity was very helpful for him. Pt. Was able to Pt. Would benefit from skilled OT services to improve RUE strength,  to improve R hand coordination to efficiently manipulate objects in order to increase  engagement in and independence in ADL/IADL tasks.  PERFORMANCE DEFICITS: in functional skills including ADLs, IADLs, coordination, strength, and UE functional use, cognitive skills including memory, and psychosocial skills including coping strategies, environmental adaptation, and routines and behaviors.   IMPAIRMENTS: are limiting patient from ADLs, IADLs, and work.   CO-MORBIDITIES: has no other co-morbidities that affects occupational performance. Patient will benefit from skilled OT to address above impairments and improve overall function.  MODIFICATION OR ASSISTANCE TO COMPLETE EVALUATION: No modification of tasks or assist necessary to complete an evaluation.  OT OCCUPATIONAL PROFILE AND HISTORY: Detailed assessment: Review of records and additional review of physical, cognitive, psychosocial history related to current functional performance.  CLINICAL DECISION MAKING: Moderate - several treatment options, min-mod task modification necessary  REHAB POTENTIAL: Good  EVALUATION COMPLEXITY: Moderate    PLAN:  OT  FREQUENCY: 1x/week  OT DURATION: 12 weeks  PLANNED INTERVENTIONS: self care/ADL training, therapeutic exercise, therapeutic activity, neuromuscular re-education, and patient/family education  RECOMMENDED OTHER SERVICES: ST, PT  CONSULTED AND AGREED WITH PLAN OF CARE: Patient  PLAN FOR NEXT SESSION: Initiate Treatment   Herma Carson, Student-OT 09/03/2022, 10:31 AM  This entire session was performed under the direct supervision and direction of a licensed therapist. I have personally read, edited, and approve of the note as written.   Olegario Messier, MS, OTR/L   09/03/2022

## 2022-09-04 ENCOUNTER — Ambulatory Visit: Payer: PPO

## 2022-09-04 ENCOUNTER — Ambulatory Visit: Payer: PPO | Admitting: Physical Therapy

## 2022-09-05 ENCOUNTER — Ambulatory Visit: Payer: PPO | Admitting: Occupational Therapy

## 2022-09-05 ENCOUNTER — Ambulatory Visit: Payer: PPO

## 2022-09-06 ENCOUNTER — Ambulatory Visit: Payer: PPO | Admitting: Speech Pathology

## 2022-09-06 ENCOUNTER — Ambulatory Visit: Payer: PPO

## 2022-09-06 DIAGNOSIS — I619 Nontraumatic intracerebral hemorrhage, unspecified: Secondary | ICD-10-CM

## 2022-09-06 DIAGNOSIS — M6281 Muscle weakness (generalized): Secondary | ICD-10-CM | POA: Diagnosis not present

## 2022-09-06 DIAGNOSIS — R278 Other lack of coordination: Secondary | ICD-10-CM

## 2022-09-06 DIAGNOSIS — R2689 Other abnormalities of gait and mobility: Secondary | ICD-10-CM

## 2022-09-06 DIAGNOSIS — R4701 Aphasia: Secondary | ICD-10-CM

## 2022-09-06 DIAGNOSIS — R262 Difficulty in walking, not elsewhere classified: Secondary | ICD-10-CM

## 2022-09-06 DIAGNOSIS — R2681 Unsteadiness on feet: Secondary | ICD-10-CM

## 2022-09-06 DIAGNOSIS — R41841 Cognitive communication deficit: Secondary | ICD-10-CM

## 2022-09-06 NOTE — Therapy (Signed)
OUTPATIENT SPEECH LANGUAGE PATHOLOGY  TREATMENT NOTE   Patient Name: Billy Kim MRN: [id number] DOB:1945-06-20, 77 y.o., male Today's Date: 09/06/2022  PCP: Crawford Givens, MD REFERRING PROVIDER: Claudette Laws, MD   End of Session - 09/06/22 1417     Visit Number 5    Number of Visits 25    Date for SLP Re-Evaluation 11/07/22    Authorization Type [employer]    Progress Note Due on Visit 10    SLP Start Time 1400    SLP Stop Time  1445    SLP Time Calculation (min) 45 min    Activity Tolerance Patient tolerated treatment well             No past medical history on file. The histories are not reviewed yet. Please review them in the "History" navigator section and refresh this SmartLink. Patient Active Problem List   Diagnosis Date Noted   Intraparenchymal hemorrhage of brain (HCC) 07/31/2022   AKI (acute kidney injury) (HCC) 07/27/2022   Hypertensive emergency 07/27/2022   Nontraumatic subcortical hemorrhage of left cerebral hemisphere (HCC) 07/25/2022   Weakness 07/20/2022   Leg swelling 07/20/2022   Dizziness 11/03/2021   Abscess 08/07/2021   Actinic keratosis 08/07/2021   Melanocytic nevi of trunk 08/07/2021   Nevus of back 08/07/2021   Personal history of other malignant neoplasm of skin 08/07/2021   Medicare annual wellness visit, subsequent 11/09/2020   Coronary artery disease of native artery of native heart with stable angina pectoris (HCC) 12/17/2019   Mixed hyperlipidemia 12/17/2019   Accelerating angina (HCC) 12/11/2019   Shingles 03/01/2019   FH: prostate cancer 02/21/2017   Advance care planning 03/31/2014   PSA elevation 03/27/2013   Thrombocytopenia, unspecified (HCC) 03/27/2013   Acute cough 03/12/2013   ED (erectile dysfunction) 12/28/2011   Neoplasm of uncertain behavior of skin 12/27/2010   Exertional chest pain 01/05/2010   Essential hypertension 04/08/2007   HYPERTRIGLYCERIDEMIA 01/08/2007   GILBERT'S SYNDROME 01/08/2007    ULCERATIVE COLITIS 01/08/2007    ONSET DATE: 07/25/2022   REFERRING DIAG: I61.9 (ICD-10-CM) - Intraparenchymal hemorrhage of brain (HCC)   THERAPY DIAG:  Aphasia  Cognitive communication deficit  Intraparenchymal hemorrhage of brain (HCC)  Rationale for Evaluation and Treatment Rehabilitation  SUBJECTIVE:   PERTINENT HISTORY and DIAGNOSTIC FINDINGS: Pt is a right handed 77 year old male with past medical history of HLD, HTN, CAD with recent intra parenchymal hemorrhage on 07/25/2022. MRI (07/26/2022) revealed "known acute hemorrhage in the left thalamus measuring up to 2 cm and chronic small vessel ischemia with chronic microhemorrhages."   PAIN:  Are you having pain? No   FALLS: Has patient fallen in last 6 months?  No  LIVING ENVIRONMENT: Lives with: lives with their spouse Lives in: House/apartment  PLOF:  Level of assistance: Independent with ADLs, Independent with IADLs Employment: Full-time employment   PATIENT GOALS   to get better  SUBJECTIVE STATEMENT: Pt states it took him 3 hours to figure out some business related calculations that shouldn't have taken him but a few minutes Pt accompanied by: significant other who remained in the lobby  OBJECTIVE:   TODAY'S TREATMENT:  Skilled ST session focused on pt's cognitive communication goals. SLP facilitated the session by providing the following interventions:  Pt brought in SFA homework, completed well  Pt described complex math problem related to his personal business. While his wife was able to help him, recommend that his wife provide some trial problems that are similar for pt to  use for structured practice.   SLP provided some examples but each math problem was too simple.   PATIENT EDUCATION: Education details: see above Person educated: Patient and Spouse Education method: Explanation Education comprehension: needs further education  HOME EXERCISE PROGRAM:        Read out loud  Complete semantic  feature analysis    GOALS:  Goals reviewed with patient? Yes  SHORT TERM GOALS: Target date: 10 sessions  With Min A, patient will describe visual scenes using 3 or more sentences at 95% accuracy. Baseline: Goal status: INITIAL   2.  Pt will report improved cognitive communication via PROM by 5 points at last ST session    Baseline:  Goal status: INITIAL  3.   With Min A, patient will recall functional verbal information 80% accuracy with use of compensations/strategies (repeats, rephrasing, notetaking, etc).   Baseline:  Goal status: INITIAL  4.  With Min A, patient will generate sentences with 3 or more words in response to a situation at 95% accuracy in order to increase ability to communicate basic wants and needs.    Baseline:  Goal status: INITIAL  LONG TERM GOALS: Target date: 11/07/2022  With Supervision A, patient will participate in complex conversation at 90% accuracy to increase ability to communicate complex thoughts, feelings, and needs.  Baseline:  Goal status: INITIAL  2.   With Supervision A, patient will demonstrate strategies for processing/recall (eg notetaking, paraphrasing) successfully by recalling >90% of details from 15 minutes verbal information.   Baseline:  Goal status: INITIAL  ASSESSMENT:  CLINICAL IMPRESSION: Patient is a 77 y.o. right handed male who was seen today for cognitive communication treatment session d/t recent left thalamic hemorrhage.  Pt with great improvement over previous evaluation (~ 2 weeks prior). Pt with increased pragmatics and verbal expression. He responded well to semantic feature analysis. See above treatment note for details.   OBJECTIVE IMPAIRMENTS include memory, executive functioning, expressive language, and aphasia. These impairments are limiting patient from return to work, managing medications, managing appointments, managing finances, household responsibilities, ADLs/IADLs, and effectively communicating at home  and in community. Factors affecting potential to achieve goals and functional outcome are  N/A .Marland Kitchen Patient will benefit from skilled SLP services to address above impairments and improve overall function.  REHAB POTENTIAL: Excellent  PLAN: SLP FREQUENCY: 1-2x/week  SLP DURATION: 12 weeks  PLANNED INTERVENTIONS: Language facilitation, Cueing hierachy, Cognitive reorganization, Internal/external aids, Functional tasks, SLP instruction and feedback, Compensatory strategies, and Patient/family education   Eldean Nanna B. Dreama Saa, M.S., CCC-SLP, Tree surgeon Certified Brain Injury Specialist [facility]  [facility] Rehabilitation Services Office [phone] Ascom [phone] Fax [phone]

## 2022-09-06 NOTE — Therapy (Signed)
OUTPATIENT PHYSICAL THERAPY NEURO TREATMENT  Patient Name: Billy Kim MRN: [id number] DOB:10-31-1945, 77 y.o., male Today's Date: 09/06/2022   PCP: Joaquim Nam, MD  REFERRING PROVIDER: Joaquim Nam, MD   END OF SESSION:  PT End of Session - 09/06/22 1513     Visit Number 5    Number of Visits 16    Date for PT Re-Evaluation 10/10/22    Authorization Type [employer]    Progress Note Due on Visit 10    PT Start Time 1315    PT Stop Time 1355    PT Time Calculation (min) 40 min    Activity Tolerance Patient tolerated treatment well;No increased pain    Behavior During Therapy [facility] for tasks assessed/performed             Past Medical History:  Diagnosis Date   Coronary artery disease    a. 11/2019 Cath: Moderate proximal LAD disease (not hemodynamically significant) and CTO's of D1 and non-dominant RCA.   HLD (hyperlipidemia)    Hypertension    Intracerebral hemorrhage (HCC)    a. 07/2022 CT Head: acute intraparenchymal hemorrhage in the left thalamus without midline shift or mass effect.   Systolic murmur    a. 07/2022 Echo: EF 60-65%, no rwma, GrI DD, nl RV fxn, RVSP 15.33mmHg, no significant valvular disease.   Ulcerative colitis 06/1979   Remission for years   Past Surgical History:  Procedure Laterality Date   CARDIAC CATHETERIZATION  03/20/01   Cardiolite EF 55% 02/10/02   CHOLECYSTECTOMY N/A 07/09/2017   Procedure: LAPAROSCOPIC CHOLECYSTECTOMY;  Surgeon: Jimmye Norman, MD;  Location: [facility] OR;  Service: General;  Laterality: N/A;   COLONOSCOPY  multiple   ENDOSCOPIC RETROGRADE CHOLANGIOPANCREATOGRAPHY (ERCP) WITH PROPOFOL N/A 07/08/2017   Procedure: ENDOSCOPIC RETROGRADE CHOLANGIOPANCREATOGRAPHY (ERCP) WITH PROPOFOL;  Surgeon: Lynann Bologna, MD;  Location: [facility] ENDOSCOPY;  Service: Endoscopy;  Laterality: N/A;   INGUINAL HERNIA REPAIR  04/09/06   Bilateral   LAPAROSCOPIC APPENDECTOMY  03/1981   LEFT HEART CATH AND CORONARY ANGIOGRAPHY Left  12/11/2019   Procedure: LEFT HEART CATH AND CORONARY ANGIOGRAPHY;  Surgeon: Yvonne Kendall, MD;  Location: [facility] INVASIVE CV LAB;  Service: Cardiovascular;  Laterality: Left;   REMOVAL OF STONES  07/08/2017   Procedure: REMOVAL OF STONES;  Surgeon: Lynann Bologna, MD;  Location: [facility] ENDOSCOPY;  Service: Endoscopy;;   SPHINCTEROTOMY  07/08/2017   Procedure: Dennison Mascot;  Surgeon: Lynann Bologna, MD;  Location: [facility] ENDOSCOPY;  Service: Endoscopy;;   Patient Active Problem List   Diagnosis Date Noted   Intraparenchymal hemorrhage of brain (HCC) 07/31/2022   AKI (acute kidney injury) (HCC) 07/27/2022   Hypertensive emergency 07/27/2022   Nontraumatic subcortical hemorrhage of left cerebral hemisphere (HCC) 07/25/2022   Weakness 07/20/2022   Leg swelling 07/20/2022   Dizziness 11/03/2021   Abscess 08/07/2021   Actinic keratosis 08/07/2021   Melanocytic nevi of trunk 08/07/2021   Nevus of back 08/07/2021   Personal history of other malignant neoplasm of skin 08/07/2021   Medicare annual wellness visit, subsequent 11/09/2020   Coronary artery disease of native artery of native heart with stable angina pectoris (HCC) 12/17/2019   Mixed hyperlipidemia 12/17/2019   Accelerating angina (HCC) 12/11/2019   Shingles 03/01/2019   FH: prostate cancer 02/21/2017   Advance care planning 03/31/2014   PSA elevation 03/27/2013   Thrombocytopenia, unspecified (HCC) 03/27/2013   Acute cough 03/12/2013   ED (erectile dysfunction) 12/28/2011   Neoplasm of uncertain behavior of skin 12/27/2010  Exertional chest pain 01/05/2010   Essential hypertension 04/08/2007   HYPERTRIGLYCERIDEMIA 01/08/2007   GILBERT'S SYNDROME 01/08/2007   ULCERATIVE COLITIS 01/08/2007    ONSET DATE: 07/25/2022  REFERRING DIAG: Intraparenchymal hemorrhage of brain (HCC)   THERAPY DIAG:  Muscle weakness (generalized)  Other lack of coordination  Other abnormalities of gait and mobility  Unsteadiness on feet  Difficulty  in walking, not elsewhere classified  Rationale for Evaluation and Treatment: Rehabilitation  SUBJECTIVE:                                                                                                                                                                                             SUBJECTIVE STATEMENT: Pt reluctant and thoughtful to give report, but says with confident no major updates, no pain, no falls, no medical updates.   Pt accompanied by: self (wife in waiting zone)   PERTINENT HISTORY:  Per acute care PT evaluation on 07/27/2022: "Prior to admission pt was independent, working in [employer], and driving. On this date, pt presents with expressive communication deficits, R inattention, R hemiparesis, and impaired balance & gait. Pt requires min assist to ambulate and decreased ability to correct gait pattern to reduce fall risk. Pt with R inattention, bumping doorway x 2 despite PT educating throughout session." Pt DC to inpatient rehab with the following measures as of discharge on 08/04/2022: AMB 400-547ft at SBA, FGA 21/30, intervention emphasis on dynamic balance, gait training, and Rt foot clearance.  PAIN:  Are you having pain? No  PRECAUTIONS: Fall  WEIGHT BEARING RESTRICTIONS: No  FALLS: Has patient fallen in last 6 months? No  PATIENT GOALS: to walk straight, work on memory  OBJECTIVE:   TODAY'S TREATMENT:                                                                                                                              DATE: 09/06/22  Nustep seat 9, arms 9, level 4 x 6 minutes  Standing cable row, 17.5lb  x15 (no frank asymmetry, no LOB) Standing dual task step tap game x2 minutes: (  5 colors, 5 directions, 3 surface heights; starting single legs to double leg, then 4 limb laterality (only 1 error throughout), just 2-3 mild LOB.   -Then final round includes lower extremity setup and upper extremity setup, both with 5 colors.  -Balance beam wlaking in //   bars 8x, able to progress to mostly hands free  -5lb AW bilat with overhead rebound in hallway, alternating sides via 180 degree step pivot turn each time (x30)    PATIENT EDUCATION: Education details: Educated on instruction for tests and measures, outcome measures, and POC.  Person educated: Patient Education method: Medical illustrator Education comprehension: verbalized understanding  HOME EXERCISE PROGRAM: Access Code: [email or web address] URL: [email or web address] Date: 09/03/2022 Prepared by: Grier Rocher  Exercises - Standing Single Leg Stance with Counter Support  - 1 x daily - 7 x weekly - 3 sets - 4 reps - 15 hold - Romberg Stance with Eyes Closed  - 1 x daily - 7 x weekly - 3 sets - 3 reps - 15 hold - Standing Tandem Balance with Counter Support  - 1 x daily - 7 x weekly - 3 sets - 4 reps - 15 hold   GOALS: Goals reviewed with patient? No  SHORT TERM GOALS: Target date: 09/12/2022   Patient will be independent in home exercise program to improve strength/mobility for better functional independence with ADLs. Baseline: No HEP currently  Goal status: INITIAL     LONG TERM GOALS: Target date: 10/10/2022   1.  Patient will increase FOTO score to equal to or greater than  80   to demonstrate statistically significant improvement in mobility and quality of life.  Baseline: 73 Goal status: INITIAL   2.  Patient will increase FGA score by > 4 points to demonstrate decreased fall risk during functional activities. Baseline: 26 Goal status: INITIAL    3.   Patient will increase 10 meter walk test to >1.58m/s as to improve gait speed for better community ambulation and to reduce fall risk. Baseline: 12.37 sec, .81 m/s Goal status: INITIAL  4.   Patient will increase six minute walk test distance to >1200 for progression to advanced community ambulator and improve gait ability Baseline: 942 feet Goal status: INITIAL  5.   Patient will successfully  navigate a simulated community environment/obstacle course while successfully attending to R side obstacles without cueing from therapist. Baseline: assess 2nd session Goal status: INITIAL 6.   Patient will display an increase of LE functional strength of 4+/5 or better for various workplace tasks by carrying weighted objects greater distances without rest breaks, no LOB or any level of assist form therapist. Baseline:  assess 2nd session Goal status: INITIAL 7.   Patient will show increased safety with floor transfers by showing consistent and proper transfer mechanics at Modified independent assist or better provided by therapist. Baseline: assess 2nd session Goal status: INITIAL   ASSESSMENT:  CLINICAL IMPRESSION: Pt remains flat but interactive and pleasant, appears to require some effort for use for memory recall at times, but no frank deficits as he makes reference to prior sessions. Pt partakes in high volume of cognitive dual task activities paired with balance tasks, only 1 cognitive error and only 2-3 mild LOB. SLS performance is not symmetrical, worse in Rt stance, but much too close to attribute with confidence solely due to neurological deficits.Pt will continue to benefit from skilled physical therapy intervention to address impairments, improve QOL, and attain therapy goals.    OBJECTIVE IMPAIRMENTS: Abnormal  gait, decreased balance, decreased cognition, and decreased endurance.   ACTIVITY LIMITATIONS: carrying, lifting, squatting, and locomotion level  PARTICIPATION LIMITATIONS: driving, community activity, and occupation  PERSONAL FACTORS: Age, Past/current experiences, and Time since onset of injury/illness/exacerbation are also affecting patient's functional outcome.   REHAB POTENTIAL: Good  CLINICAL DECISION MAKING: Stable/uncomplicated  EVALUATION COMPLEXITY: Low  PLAN:  PT FREQUENCY: 1-2x/week  PT DURATION: 8 weeks  PLANNED INTERVENTIONS: Therapeutic  exercises, Therapeutic activity, Neuromuscular re-education, Balance training, Gait training, Patient/Family education, Self Care, Joint mobilization, Stair training, Cryotherapy, Moist heat, and Re-evaluation  PLAN FOR NEXT SESSION:   Resisted gait training.  High level dynamic balance and gait training.   Astou Lada C, PT 09/06/2022, 3:23 PM  3:32 PM, 09/06/22 Rosamaria Lints, PT, DPT Physical Therapist - [facility]  Outpatient Physical Therapy- Main [facility] [phone]     Physical Therapist - [facility]  [facility]  3:23 PM 09/06/22

## 2022-09-10 ENCOUNTER — Ambulatory Visit: Payer: PPO | Admitting: Speech Pathology

## 2022-09-10 ENCOUNTER — Ambulatory Visit: Payer: PPO

## 2022-09-10 DIAGNOSIS — R278 Other lack of coordination: Secondary | ICD-10-CM

## 2022-09-10 DIAGNOSIS — M6281 Muscle weakness (generalized): Secondary | ICD-10-CM

## 2022-09-10 DIAGNOSIS — R2689 Other abnormalities of gait and mobility: Secondary | ICD-10-CM

## 2022-09-10 DIAGNOSIS — I619 Nontraumatic intracerebral hemorrhage, unspecified: Secondary | ICD-10-CM

## 2022-09-10 DIAGNOSIS — R2681 Unsteadiness on feet: Secondary | ICD-10-CM

## 2022-09-10 DIAGNOSIS — R262 Difficulty in walking, not elsewhere classified: Secondary | ICD-10-CM

## 2022-09-10 DIAGNOSIS — R41841 Cognitive communication deficit: Secondary | ICD-10-CM

## 2022-09-10 NOTE — Therapy (Signed)
OUTPATIENT SPEECH LANGUAGE PATHOLOGY  TREATMENT NOTE   Patient Name: Billy Kim MRN: [id number] DOB:1945-08-30, 77 y.o., male Today's Date: 09/10/2022  PCP: Crawford Givens, MD REFERRING PROVIDER: Claudette Laws, MD   End of Session - 09/10/22 1212     Visit Number 6    Number of Visits 25    Date for SLP Re-Evaluation 11/07/22    Authorization Type [employer]    Progress Note Due on Visit 10    SLP Start Time 1145    SLP Stop Time  1230    SLP Time Calculation (min) 45 min    Activity Tolerance Patient tolerated treatment well             No past medical history on file. The histories are not reviewed yet. Please review them in the "History" navigator section and refresh this SmartLink. Patient Active Problem List   Diagnosis Date Noted   Intraparenchymal hemorrhage of brain (HCC) 07/31/2022   AKI (acute kidney injury) (HCC) 07/27/2022   Hypertensive emergency 07/27/2022   Nontraumatic subcortical hemorrhage of left cerebral hemisphere (HCC) 07/25/2022   Weakness 07/20/2022   Leg swelling 07/20/2022   Dizziness 11/03/2021   Abscess 08/07/2021   Actinic keratosis 08/07/2021   Melanocytic nevi of trunk 08/07/2021   Nevus of back 08/07/2021   Personal history of other malignant neoplasm of skin 08/07/2021   Medicare annual wellness visit, subsequent 11/09/2020   Coronary artery disease of native artery of native heart with stable angina pectoris (HCC) 12/17/2019   Mixed hyperlipidemia 12/17/2019   Accelerating angina (HCC) 12/11/2019   Shingles 03/01/2019   FH: prostate cancer 02/21/2017   Advance care planning 03/31/2014   PSA elevation 03/27/2013   Thrombocytopenia, unspecified (HCC) 03/27/2013   Acute cough 03/12/2013   ED (erectile dysfunction) 12/28/2011   Neoplasm of uncertain behavior of skin 12/27/2010   Exertional chest pain 01/05/2010   Essential hypertension 04/08/2007   HYPERTRIGLYCERIDEMIA 01/08/2007   GILBERT'S SYNDROME 01/08/2007    ULCERATIVE COLITIS 01/08/2007    ONSET DATE: 07/25/2022   REFERRING DIAG: I61.9 (ICD-10-CM) - Intraparenchymal hemorrhage of brain (HCC)   THERAPY DIAG:  Cognitive communication deficit  Intraparenchymal hemorrhage of brain (HCC)  Rationale for Evaluation and Treatment Rehabilitation  SUBJECTIVE:   PERTINENT HISTORY and DIAGNOSTIC FINDINGS: Pt is a right handed 77 year old male with past medical history of HLD, HTN, CAD with recent intra parenchymal hemorrhage on 07/25/2022. MRI (07/26/2022) revealed "known acute hemorrhage in the left thalamus measuring up to 2 cm and chronic small vessel ischemia with chronic microhemorrhages."   PAIN:  Are you having pain? No   FALLS: Has patient fallen in last 6 months?  No  LIVING ENVIRONMENT: Lives with: lives with their spouse Lives in: House/apartment  PLOF:  Level of assistance: Independent with ADLs, Independent with IADLs Employment: Full-time employment   PATIENT GOALS   to get better  SUBJECTIVE STATEMENT: Pt pleasant, speaking of rain that we recently had Pt accompanied by: significant other who remained in the lobby  OBJECTIVE:   TODAY'S TREATMENT:  Skilled ST session focused on pt's cognitive communication goals. SLP facilitated the session by providing the following interventions:  Executive Function skills targeted thru use of semi-complex deductive reasoning tasks. (WAL[employer] 9: Verbal and Chief Operating Officer; Verbal Reasoning- Convergent Reasoning)  Page: 49 with Minimal faded to rare Min A and sightly more than a reasonable amount of time, pt able to complete 90% accuracy   PATIENT EDUCATION: Education details: see above Person educated:  Patient and Spouse Education method: Explanation Education comprehension: needs further education  HOME EXERCISE PROGRAM:        Read out loud  Complete semantic feature analysis    GOALS:  Goals reviewed with patient? Yes  SHORT TERM GOALS: Target date: 10 sessions  With  Min A, patient will describe visual scenes using 3 or more sentences at 95% accuracy. Baseline: Goal status: INITIAL   2.  Pt will report improved cognitive communication via PROM by 5 points at last ST session    Baseline:  Goal status: INITIAL  3.   With Min A, patient will recall functional verbal information 80% accuracy with use of compensations/strategies (repeats, rephrasing, notetaking, etc).   Baseline:  Goal status: INITIAL  4.  With Min A, patient will generate sentences with 3 or more words in response to a situation at 95% accuracy in order to increase ability to communicate basic wants and needs.    Baseline:  Goal status: INITIAL  LONG TERM GOALS: Target date: 11/07/2022  With Supervision A, patient will participate in complex conversation at 90% accuracy to increase ability to communicate complex thoughts, feelings, and needs.  Baseline:  Goal status: INITIAL  2.   With Supervision A, patient will demonstrate strategies for processing/recall (eg notetaking, paraphrasing) successfully by recalling >90% of details from 15 minutes verbal information.   Baseline:  Goal status: INITIAL  ASSESSMENT:  CLINICAL IMPRESSION: Patient is a 77 y.o. right handed male who was seen today for cognitive communication treatment session d/t recent left thalamic hemorrhage. Pt with improved higher level reasoning during today's session. See above treatment note for details.   OBJECTIVE IMPAIRMENTS include memory, executive functioning, expressive language, and aphasia. These impairments are limiting patient from return to work, managing medications, managing appointments, managing finances, household responsibilities, ADLs/IADLs, and effectively communicating at home and in community. Factors affecting potential to achieve goals and functional outcome are  N/A .Marland Kitchen Patient will benefit from skilled SLP services to address above impairments and improve overall function.  REHAB POTENTIAL:  Excellent  PLAN: SLP FREQUENCY: 1-2x/week  SLP DURATION: 12 weeks  PLANNED INTERVENTIONS: Language facilitation, Cueing hierachy, Cognitive reorganization, Internal/external aids, Functional tasks, SLP instruction and feedback, Compensatory strategies, and Patient/family education   Quanisha Drewry B. Dreama Saa, M.S., CCC-SLP, Tree surgeon Certified Brain Injury Specialist [facility]  [facility] Rehabilitation Services Office [phone] Ascom [phone] Fax [phone]

## 2022-09-10 NOTE — Therapy (Signed)
OUTPATIENT PHYSICAL THERAPY NEURO TREATMENT  Patient Name: Billy Kim MRN: [id number] DOB:1945-11-05, 77 y.o., male Today's Date: 09/11/2022   PCP: Joaquim Nam, MD  REFERRING PROVIDER: Joaquim Nam, MD   END OF SESSION:  PT End of Session - 09/10/22 1104     Visit Number 6    Number of Visits 16    Date for PT Re-Evaluation 10/10/22    Authorization Type [employer]    Progress Note Due on Visit 10    PT Start Time 1102    PT Stop Time 1144    PT Time Calculation (min) 42 min    Activity Tolerance Patient tolerated treatment well;No increased pain    Behavior During Therapy [facility] for tasks assessed/performed              Past Medical History:  Diagnosis Date   Coronary artery disease    a. 11/2019 Cath: Moderate proximal LAD disease (not hemodynamically significant) and CTO's of D1 and non-dominant RCA.   HLD (hyperlipidemia)    Hypertension    Intracerebral hemorrhage (HCC)    a. 07/2022 CT Head: acute intraparenchymal hemorrhage in the left thalamus without midline shift or mass effect.   Systolic murmur    a. 07/2022 Echo: EF 60-65%, no rwma, GrI DD, nl RV fxn, RVSP 15.43mmHg, no significant valvular disease.   Ulcerative colitis 06/1979   Remission for years   Past Surgical History:  Procedure Laterality Date   CARDIAC CATHETERIZATION  03/20/01   Cardiolite EF 55% 02/10/02   CHOLECYSTECTOMY N/A 07/09/2017   Procedure: LAPAROSCOPIC CHOLECYSTECTOMY;  Surgeon: Jimmye Norman, MD;  Location: [facility] OR;  Service: General;  Laterality: N/A;   COLONOSCOPY  multiple   ENDOSCOPIC RETROGRADE CHOLANGIOPANCREATOGRAPHY (ERCP) WITH PROPOFOL N/A 07/08/2017   Procedure: ENDOSCOPIC RETROGRADE CHOLANGIOPANCREATOGRAPHY (ERCP) WITH PROPOFOL;  Surgeon: Lynann Bologna, MD;  Location: [facility] ENDOSCOPY;  Service: Endoscopy;  Laterality: N/A;   INGUINAL HERNIA REPAIR  04/09/06   Bilateral   LAPAROSCOPIC APPENDECTOMY  03/1981   LEFT HEART CATH AND CORONARY ANGIOGRAPHY Left  12/11/2019   Procedure: LEFT HEART CATH AND CORONARY ANGIOGRAPHY;  Surgeon: Yvonne Kendall, MD;  Location: [facility] INVASIVE CV LAB;  Service: Cardiovascular;  Laterality: Left;   REMOVAL OF STONES  07/08/2017   Procedure: REMOVAL OF STONES;  Surgeon: Lynann Bologna, MD;  Location: [facility] ENDOSCOPY;  Service: Endoscopy;;   SPHINCTEROTOMY  07/08/2017   Procedure: Dennison Mascot;  Surgeon: Lynann Bologna, MD;  Location: [facility] ENDOSCOPY;  Service: Endoscopy;;   Patient Active Problem List   Diagnosis Date Noted   Intraparenchymal hemorrhage of brain (HCC) 07/31/2022   AKI (acute kidney injury) (HCC) 07/27/2022   Hypertensive emergency 07/27/2022   Nontraumatic subcortical hemorrhage of left cerebral hemisphere (HCC) 07/25/2022   Weakness 07/20/2022   Leg swelling 07/20/2022   Dizziness 11/03/2021   Abscess 08/07/2021   Actinic keratosis 08/07/2021   Melanocytic nevi of trunk 08/07/2021   Nevus of back 08/07/2021   Personal history of other malignant neoplasm of skin 08/07/2021   Medicare annual wellness visit, subsequent 11/09/2020   Coronary artery disease of native artery of native heart with stable angina pectoris (HCC) 12/17/2019   Mixed hyperlipidemia 12/17/2019   Accelerating angina (HCC) 12/11/2019   Shingles 03/01/2019   FH: prostate cancer 02/21/2017   Advance care planning 03/31/2014   PSA elevation 03/27/2013   Thrombocytopenia, unspecified (HCC) 03/27/2013   Acute cough 03/12/2013   ED (erectile dysfunction) 12/28/2011   Neoplasm of uncertain behavior of skin 12/27/2010  Exertional chest pain 01/05/2010   Essential hypertension 04/08/2007   HYPERTRIGLYCERIDEMIA 01/08/2007   GILBERT'S SYNDROME 01/08/2007   ULCERATIVE COLITIS 01/08/2007    ONSET DATE: 07/25/2022  REFERRING DIAG: Intraparenchymal hemorrhage of brain (HCC)   THERAPY DIAG:  Muscle weakness (generalized)  Other lack of coordination  Other abnormalities of gait and mobility  Unsteadiness on feet  Difficulty  in walking, not elsewhere classified  Rationale for Evaluation and Treatment: Rehabilitation  SUBJECTIVE:                                                                                                                                                                                             SUBJECTIVE STATEMENT: Patient reports doing well overall- continues to deny any falls. States working hard on his speech/communication  Pt accompanied by: self (wife in waiting zone)   PERTINENT HISTORY:  Per acute care PT evaluation on 07/27/2022: "Prior to admission pt was independent, working in [employer], and driving. On this date, pt presents with expressive communication deficits, R inattention, R hemiparesis, and impaired balance & gait. Pt requires min assist to ambulate and decreased ability to correct gait pattern to reduce fall risk. Pt with R inattention, bumping doorway x 2 despite PT educating throughout session." Pt DC to inpatient rehab with the following measures as of discharge on 08/04/2022: AMB 400-577ft at SBA, FGA 21/30, intervention emphasis on dynamic balance, gait training, and Rt foot clearance.  PAIN:  Are you having pain? No  PRECAUTIONS: Fall  WEIGHT BEARING RESTRICTIONS: No  FALLS: Has patient fallen in last 6 months? No  PATIENT GOALS: to walk straight, work on memory  OBJECTIVE:   TODAY'S TREATMENT:                                                                                                                              DATE: 09/11/22  NMR:  Dynamic high knee marching on airex pad without UE suppport x 25 reps  Arranging letters into alphabetical order in varying foot positions:   -Feet narrowed on airex  -Feet tandem on airex  -  Feet tandem on 1/2 foam (curve side up)   - Feet wide on 1/2 foam (curve side down) - Very challenged ant/post weight shift.   Arranging letters by color into corresponding shapes on dry erase board- while performing SLS (up to 30 sec  each LE) x multiple trials 6+     PATIENT EDUCATION: Education details: Educated on instruction for tests and measures, outcome measures, and POC.  Person educated: Patient Education method: Medical illustrator Education comprehension: verbalized understanding  HOME EXERCISE PROGRAM: Access Code: [email or web address] URL: [email or web address] Date: 09/03/2022 Prepared by: Grier Rocher  Exercises - Standing Single Leg Stance with Counter Support  - 1 x daily - 7 x weekly - 3 sets - 4 reps - 15 hold - Romberg Stance with Eyes Closed  - 1 x daily - 7 x weekly - 3 sets - 3 reps - 15 hold - Standing Tandem Balance with Counter Support  - 1 x daily - 7 x weekly - 3 sets - 4 reps - 15 hold   GOALS: Goals reviewed with patient? No  SHORT TERM GOALS: Target date: 09/12/2022   Patient will be independent in home exercise program to improve strength/mobility for better functional independence with ADLs. Baseline: No HEP currently  Goal status: INITIAL     LONG TERM GOALS: Target date: 10/10/2022   1.  Patient will increase FOTO score to equal to or greater than  80   to demonstrate statistically significant improvement in mobility and quality of life.  Baseline: 73 Goal status: INITIAL   2.  Patient will increase FGA score by > 4 points to demonstrate decreased fall risk during functional activities. Baseline: 26 Goal status: INITIAL    3.   Patient will increase 10 meter walk test to >1.54m/s as to improve gait speed for better community ambulation and to reduce fall risk. Baseline: 12.37 sec, .81 m/s Goal status: INITIAL  4.   Patient will increase six minute walk test distance to >1200 for progression to advanced community ambulator and improve gait ability Baseline: 942 feet Goal status: INITIAL  5.   Patient will successfully navigate a simulated community environment/obstacle course while successfully attending to R side obstacles without cueing from  therapist. Baseline: assess 2nd session Goal status: INITIAL 6.   Patient will display an increase of LE functional strength of 4+/5 or better for various workplace tasks by carrying weighted objects greater distances without rest breaks, no LOB or any level of assist form therapist. Baseline:  assess 2nd session Goal status: INITIAL 7.   Patient will show increased safety with floor transfers by showing consistent and proper transfer mechanics at Modified independent assist or better provided by therapist. Baseline: assess 2nd session Goal status: INITIAL   ASSESSMENT:  CLINICAL IMPRESSION: Continued per plan of care with high level balance. Pt participated again in high volume of cognitive dual task activities paired with balance tasks- several LOB as patient was very challenged with narrowed Base of support. He performed better with SLS  and able to complete with dual task activities -able to stand 30 sec at a time today. Pt will continue to benefit from skilled physical therapy intervention to address impairments, improve QOL, and attain therapy goals.    OBJECTIVE IMPAIRMENTS: Abnormal gait, decreased balance, decreased cognition, and decreased endurance.   ACTIVITY LIMITATIONS: carrying, lifting, squatting, and locomotion level  PARTICIPATION LIMITATIONS: driving, community activity, and occupation  PERSONAL FACTORS: Age, Past/current experiences, and Time since onset of injury/illness/exacerbation are also affecting  patient's functional outcome.   REHAB POTENTIAL: Good  CLINICAL DECISION MAKING: Stable/uncomplicated  EVALUATION COMPLEXITY: Low  PLAN:  PT FREQUENCY: 1-2x/week  PT DURATION: 8 weeks  PLANNED INTERVENTIONS: Therapeutic exercises, Therapeutic activity, Neuromuscular re-education, Balance training, Gait training, Patient/Family education, Self Care, Joint mobilization, Stair training, Cryotherapy, Moist heat, and Re-evaluation  PLAN FOR NEXT SESSION:    Resisted gait training.  High level dynamic balance and gait training. Assess goals 5,6,7 next visit   Lenda Kelp, PT 09/11/2022, 8:06 AM  8:06 AM, 09/11/22 Physical Therapist - [facility]  Outpatient Physical Therapy- Main Campus [phone]     Physical Therapist - [facility]  [facility]  8:06 AM 09/11/22

## 2022-09-10 NOTE — Therapy (Signed)
OUTPATIENT OCCUPATIONAL THERAPY NEURO TREATMENT NOTE  Patient Name: Billy Kim MRN: [id number] DOB:11-06-1945, 77 y.o., male Today's Date: 09/10/2022  PCP: Joaquim Nam, MD REFERRING PROVIDER: Charlton Amor, PA-C  END OF SESSION:  OT End of Session - 09/10/22 1149     Visit Number 4    Number of Visits 12    Date for OT Re-Evaluation 11/14/22    Progress Note Due on Visit 10    OT Start Time 1015    OT Stop Time 1100    OT Time Calculation (min) 45 min    Activity Tolerance Patient tolerated treatment well    Behavior During Therapy [facility] for tasks assessed/performed            Past Medical History:  Diagnosis Date   Coronary artery disease    a. 11/2019 Cath: Moderate proximal LAD disease (not hemodynamically significant) and CTO's of D1 and non-dominant RCA.   HLD (hyperlipidemia)    Hypertension    Intracerebral hemorrhage (HCC)    a. 07/2022 CT Head: acute intraparenchymal hemorrhage in the left thalamus without midline shift or mass effect.   Systolic murmur    a. 07/2022 Echo: EF 60-65%, no rwma, GrI DD, nl RV fxn, RVSP 15.58mmHg, no significant valvular disease.   Ulcerative colitis 06/1979   Remission for years   Past Surgical History:  Procedure Laterality Date   CARDIAC CATHETERIZATION  03/20/01   Cardiolite EF 55% 02/10/02   CHOLECYSTECTOMY N/A 07/09/2017   Procedure: LAPAROSCOPIC CHOLECYSTECTOMY;  Surgeon: Jimmye Norman, MD;  Location: [facility] OR;  Service: General;  Laterality: N/A;   COLONOSCOPY  multiple   ENDOSCOPIC RETROGRADE CHOLANGIOPANCREATOGRAPHY (ERCP) WITH PROPOFOL N/A 07/08/2017   Procedure: ENDOSCOPIC RETROGRADE CHOLANGIOPANCREATOGRAPHY (ERCP) WITH PROPOFOL;  Surgeon: Lynann Bologna, MD;  Location: [facility] ENDOSCOPY;  Service: Endoscopy;  Laterality: N/A;   INGUINAL HERNIA REPAIR  04/09/06   Bilateral   LAPAROSCOPIC APPENDECTOMY  03/1981   LEFT HEART CATH AND CORONARY ANGIOGRAPHY Left 12/11/2019   Procedure: LEFT HEART CATH AND CORONARY  ANGIOGRAPHY;  Surgeon: Yvonne Kendall, MD;  Location: [facility] INVASIVE CV LAB;  Service: Cardiovascular;  Laterality: Left;   REMOVAL OF STONES  07/08/2017   Procedure: REMOVAL OF STONES;  Surgeon: Lynann Bologna, MD;  Location: [facility] ENDOSCOPY;  Service: Endoscopy;;   SPHINCTEROTOMY  07/08/2017   Procedure: Dennison Mascot;  Surgeon: Lynann Bologna, MD;  Location: [facility] ENDOSCOPY;  Service: Endoscopy;;   Patient Active Problem List   Diagnosis Date Noted   Intraparenchymal hemorrhage of brain (HCC) 07/31/2022   AKI (acute kidney injury) (HCC) 07/27/2022   Hypertensive emergency 07/27/2022   Nontraumatic subcortical hemorrhage of left cerebral hemisphere (HCC) 07/25/2022   Weakness 07/20/2022   Leg swelling 07/20/2022   Dizziness 11/03/2021   Abscess 08/07/2021   Actinic keratosis 08/07/2021   Melanocytic nevi of trunk 08/07/2021   Nevus of back 08/07/2021   Personal history of other malignant neoplasm of skin 08/07/2021   Medicare annual wellness visit, subsequent 11/09/2020   Coronary artery disease of native artery of native heart with stable angina pectoris (HCC) 12/17/2019   Mixed hyperlipidemia 12/17/2019   Accelerating angina (HCC) 12/11/2019   Shingles 03/01/2019   FH: prostate cancer 02/21/2017   Advance care planning 03/31/2014   PSA elevation 03/27/2013   Thrombocytopenia, unspecified (HCC) 03/27/2013   Acute cough 03/12/2013   ED (erectile dysfunction) 12/28/2011   Neoplasm of uncertain behavior of skin 12/27/2010   Exertional chest pain 01/05/2010   Essential hypertension 04/08/2007   HYPERTRIGLYCERIDEMIA 01/08/2007  GILBERT'S SYNDROME 01/08/2007   ULCERATIVE COLITIS 01/08/2007   ONSET DATE: 07/25/2022  REFERRING DIAG: Intraparenchymal hemorrhage of brain (HCC)  THERAPY DIAG:  Muscle weakness (generalized)  Other lack of coordination  Intraparenchymal hemorrhage of brain (HCC)  Rationale for Evaluation and Treatment: Rehabilitation  SUBJECTIVE:  SUBJECTIVE  STATEMENT: Pt reports that he's been taking his medications mostly on his own, but his wife will often ask to make sure he's taken what he needs to take throughout the day.  Pt admits that he sometimes forgets to take his vitamins and he typically keeps these in a separate drawer from his prescription medications. Pt accompanied by: self  PERTINENT HISTORY: Pt. Is a 77 y.o. male who has been diagnosed with a Intraparenchymal hemorrhage of the brain (HCC). Other dx include AKI, hypertension, hyperlipidemia, and history of CAD and ulcerative colitis. Pt. Presents with expressive communication deficits, R inattention, and R hemiparesis and impaired balance.   PRECAUTIONS: No driving WEIGHT BEARING RESTRICTIONS: No  PAIN:  Are you having pain? No  FALLS: Has patient fallen in last 6 months? No  LIVING ENVIRONMENT: Lives with: lives with their spouse Lives in: 2 story home Stairs: Yes: Internal: 14 steps; and External: 4 steps; can reach both Has following equipment at home: None  PLOF: Independent  PATIENT GOALS: Work on improving memory, and balance on the R side  OBJECTIVE:   HAND DOMINANCE: Right  ADLs:  Eating: Independent however holds utensils differently with the right hand, grabs handle with entire hand Grooming: Independent UB Dressing: Independent LB Dressing: Takes longer to put on pants due to balance, independent with tying shoes and putting socks on when sitting down Toileting: Independent Bathing: Tub shower, standing, holds onto wall to keep balance Tub Shower transfers: Independent, hold onto wall Equipment: none  IADLs: Shopping: Does with spouse, pushes cart to help balance,  Light housekeeping: Wife does most of it now, increased time due to being cautious to ensure he does not lose his balance Meal Prep: Wife prepares all meals, able to go to fridge to get out a drink Community mobility: Uses grocery cart to balance Medication management: Wife sets them  out each day for him, pt. Takes them independently Financial management: Spouse handles finance Handwriting: 100% legible  MOBILITY STATUS: Independent however often has to hold onto things to ensure balance  POSTURE COMMENTS:  No Significant postural limitations Sitting balance:  WFL  ACTIVITY TOLERANCE: Activity tolerance: WFL  FUNCTIONAL OUTCOME MEASURES: FOTO: Eval: 98 TR:98  (FOTO goal is not indicated at this time)  UPPER EXTREMITY ROM:    Active ROM Right WFL Left WFL  Shoulder flexion    Shoulder abduction    Shoulder adduction    Shoulder extension    Shoulder internal rotation    Shoulder external rotation    Elbow flexion    Elbow extension    Wrist flexion    Wrist extension    Wrist ulnar deviation    Wrist radial deviation    Wrist pronation    Wrist supination    (Blank rows = not tested)  UPPER EXTREMITY MMT:     MMT Right eval Left eval  Shoulder flexion 5/5 5/5  Shoulder abduction 5/5 5/5  Shoulder adduction    Shoulder extension    Shoulder internal rotation    Shoulder external rotation    Middle trapezius    Lower trapezius    Elbow flexion 5/5 5/5  Elbow extension 5/5 5/5  Wrist flexion 5/5 5/5  Wrist extension    Wrist ulnar deviation    Wrist radial deviation    Wrist pronation    Wrist supination    (Blank rows = not tested)  HAND FUNCTION: Grip strength: Right: 87 lbs; Left: 102 lbs, Lateral pinch: Right: 21 lbs, Left: 21 lbs, and 3 point pinch: Right: 17 lbs, Left: 18 lbs 09/10/22: Grip strength: Right: 96 lbs  COORDINATION: 9 Hole Peg test: Right: 29 sec; Left: 25 sec  SENSATION: Light touch: WFL Stereognosis: WFL  EDEMA: None  MUSCLE TONE: None  COGNITION: Overall cognitive status: Impaired memory  VISION: Subjective report: Pt. Reports he has not noticed any vision changes. Baseline vision: Wears glasses all the time Visual history:  None  VISION ASSESSMENT: To be further assessed in functional  context  PERCEPTION: TBD  PRAXIS: WFL  TODAY'S TREATMENT:                                                                                                                              DATE: 09/10/2022 Therapeutic Exercise: Grip strength remeasured: R grip 96#.  Reviewed with pt increase of 9 lbs from eval and encouraged pt to continue with regular use of theraputty for 5-10 min at a time several times during the day.  Anticipate pt is still lacking ~10-15 lbs from his baseline strength as compared to the L non-dominant hand.  Self Care/Home Management:  Pill Box Test: Fail with >3 errors of any type. Reviewed errors with pt and assisted with corrections.  Reviewed pt's medication management system at home and made recommendation for pt to utilize a.m/p.m. pill box, and pt to set up Rx and vitamins in a weekly organizer to minimize errors, with spouse supervising accuracy with set up until pt can demo consistent indep with meds without errors.  Reviewed keeping prn meds near pill organizer but not in the organizer itself.    Simulated work related tasks regarding note taking for a client address and inputting a new address into map quest app on phone, which is the app that pt uses.  Pt required extra time to navigate app and input directions, but was able to manage with 1 vc to input 1 out of 2 addresses accurately.    PATIENT EDUCATION: Education details: use of a pill organizer to reduce forgetfulness with taking meds/vitamins Person educated: Patient Education method: Explanation, Demonstration, and Verbal cues Education comprehension: verbalized understanding  HOME EXERCISE PROGRAM: Theraputty exercises for R hand strengthening   GOALS: Goals reviewed with patient? Yes  SHORT TERM GOALS: Target date: 10/03/2022    Pt. Will be independent with HEP for increasing R hand strength and coordination. Baseline: Currently no HEP Goal status: INITIAL  LONG TERM GOALS: Target date:  11/14/22  Pt. Will increase R grip strength by 5# to be able to securely hold and carry objects for ADL/IADL tasks.  Baseline: Grip strength: Right: 87 lbs; Left: 102 lbs Goal status: INITIAL  2.  Pt. Will improve R FMC skills by 2 seconds of speed to independently manipulate small objects during ADL/IADL tasks.  Baseline: Eval: 9 hole peg test: L: 25 seconds, R: 29 seconds Goal status: INITIAL  3.  Pt. Will demonstrate simulated pill box setup with supervision.  Baseline: Eval: Pts. Wife currently sets out medicine for Pt. To take, Pt. Has pill organizer but does not use it yet. Goal status: INITIAL  4.  Pt. Will independently demonstrate holding utensil with proper form to increase engagement with eating tasks.   Baseline: Pt. Currently uses full grasp to hold utensil while eating rather than  Goal status: INITIAL   ASSESSMENT:  CLINICAL IMPRESSION: Pt reports that he's been taking his medications mostly on his own, but his wife will often ask to make sure he's taken what he needs to take throughout the day.  Pt admits that he sometimes forgets to take his vitamins and he typically keeps these in a separate drawer from his prescription medications.  Pt completed Pill Box Test with a failing score d/t having >3 errors with med set up.  Pt did well with the set up of a basic instruction of 1 pill daily at bedtime, or 1 pill every other day, or once in the morning, but made multiple errors for a 3x a day pill and placed these pills every other day, and was unable to correctly place the bottle which read "2x daily with breakfast and dinner," as he appeared to confuse the day of the week columns for the time of day slots on 2 occasions.  Pt was receptive to using a daily pill organizer with his wife providing supv with set up until pt can manage his meds independently.   OT did encourage pt to keep all vitamins and pills together in 1 drawer so that neither are forgotten, and prn meds separate but  near by a pill organizer.  Pt receptive to all recommendations.  R grip strength has increased by 9 lbs since eval.  Pt has been encouraged to continue with his putty at home daily.  Practiced simulated work tasks with note taking and inputting addresses into a gps app on a cell phone.  Pt required extra time, but felt this was helpful.  Pt did mention that his gps app may be on a wrong setting as it displays directions but it no longer voices the directions.  Pt did not mention this until end of session so may be able to advise pt on setting changes as needed next session.  Pt will continue to benefit from skilled OT services to improve RUE strength, to improve R hand coordination to efficiently manipulate objects in order to increase  engagement in and independence in ADL/IADL tasks, and to practice simulated return to work tasks (note taking for new client appointments and use of gps practiced this date) as pt continues to be eager to return to work.    PERFORMANCE DEFICITS: in functional skills including ADLs, IADLs, coordination, strength, and UE functional use, cognitive skills including memory, and psychosocial skills including coping strategies, environmental adaptation, and routines and behaviors.   IMPAIRMENTS: are limiting patient from ADLs, IADLs, and work.   CO-MORBIDITIES: has no other co-morbidities that affects occupational performance. Patient will benefit from skilled OT to address above impairments and improve overall function.  MODIFICATION OR ASSISTANCE TO COMPLETE EVALUATION: No modification of tasks or assist necessary to complete an evaluation.  OT OCCUPATIONAL PROFILE AND HISTORY: Detailed assessment: Review of records  and additional review of physical, cognitive, psychosocial history related to current functional performance.  CLINICAL DECISION MAKING: Moderate - several treatment options, min-mod task modification necessary  REHAB POTENTIAL: Good  EVALUATION COMPLEXITY:  Moderate    PLAN:  OT FREQUENCY: 1x/week  OT DURATION: 12 weeks  PLANNED INTERVENTIONS: self care/ADL training, therapeutic exercise, therapeutic activity, neuromuscular re-education, and patient/family education  RECOMMENDED OTHER SERVICES: ST, PT  CONSULTED AND AGREED WITH PLAN OF CARE: Patient  PLAN FOR NEXT SESSION: Initiate Treatment   Otis Dials, OT 09/10/2022, 11:50 AM

## 2022-09-12 ENCOUNTER — Ambulatory Visit: Payer: PPO

## 2022-09-12 ENCOUNTER — Other Ambulatory Visit: Payer: PPO

## 2022-09-12 ENCOUNTER — Ambulatory Visit: Payer: PPO | Admitting: Occupational Therapy

## 2022-09-12 ENCOUNTER — Ambulatory Visit: Payer: PPO | Admitting: Speech Pathology

## 2022-09-12 DIAGNOSIS — I619 Nontraumatic intracerebral hemorrhage, unspecified: Secondary | ICD-10-CM

## 2022-09-12 DIAGNOSIS — R2689 Other abnormalities of gait and mobility: Secondary | ICD-10-CM

## 2022-09-12 DIAGNOSIS — R41841 Cognitive communication deficit: Secondary | ICD-10-CM

## 2022-09-12 DIAGNOSIS — R262 Difficulty in walking, not elsewhere classified: Secondary | ICD-10-CM

## 2022-09-12 DIAGNOSIS — R278 Other lack of coordination: Secondary | ICD-10-CM

## 2022-09-12 DIAGNOSIS — R2681 Unsteadiness on feet: Secondary | ICD-10-CM

## 2022-09-12 DIAGNOSIS — M6281 Muscle weakness (generalized): Secondary | ICD-10-CM | POA: Diagnosis not present

## 2022-09-12 NOTE — Therapy (Signed)
OUTPATIENT SPEECH LANGUAGE PATHOLOGY  TREATMENT NOTE   Patient Name: Billy Kim MRN: [id number] DOB:1945-07-03, 77 y.o., male Today's Date: 09/12/2022  PCP: Crawford Givens, MD REFERRING PROVIDER: Claudette Laws, MD   End of Session - 09/12/22 0813     Visit Number 7    Number of Visits 25    Date for SLP Re-Evaluation 11/07/22    Authorization Type [employer]    Progress Note Due on Visit 10    SLP Start Time 0800    SLP Stop Time  0845    SLP Time Calculation (min) 45 min    Activity Tolerance Patient tolerated treatment well             No past medical history on file. The histories are not reviewed yet. Please review them in the "History" navigator section and refresh this SmartLink. Patient Active Problem List   Diagnosis Date Noted   Intraparenchymal hemorrhage of brain (HCC) 07/31/2022   AKI (acute kidney injury) (HCC) 07/27/2022   Hypertensive emergency 07/27/2022   Nontraumatic subcortical hemorrhage of left cerebral hemisphere (HCC) 07/25/2022   Weakness 07/20/2022   Leg swelling 07/20/2022   Dizziness 11/03/2021   Abscess 08/07/2021   Actinic keratosis 08/07/2021   Melanocytic nevi of trunk 08/07/2021   Nevus of back 08/07/2021   Personal history of other malignant neoplasm of skin 08/07/2021   Medicare annual wellness visit, subsequent 11/09/2020   Coronary artery disease of native artery of native heart with stable angina pectoris (HCC) 12/17/2019   Mixed hyperlipidemia 12/17/2019   Accelerating angina (HCC) 12/11/2019   Shingles 03/01/2019   FH: prostate cancer 02/21/2017   Advance care planning 03/31/2014   PSA elevation 03/27/2013   Thrombocytopenia, unspecified (HCC) 03/27/2013   Acute cough 03/12/2013   ED (erectile dysfunction) 12/28/2011   Neoplasm of uncertain behavior of skin 12/27/2010   Exertional chest pain 01/05/2010   Essential hypertension 04/08/2007   HYPERTRIGLYCERIDEMIA 01/08/2007   GILBERT'S SYNDROME 01/08/2007    ULCERATIVE COLITIS 01/08/2007    ONSET DATE: 07/25/2022   REFERRING DIAG: I61.9 (ICD-10-CM) - Intraparenchymal hemorrhage of brain (HCC)   THERAPY DIAG:  Cognitive communication deficit  Intraparenchymal hemorrhage of brain (HCC)  Rationale for Evaluation and Treatment Rehabilitation  SUBJECTIVE:   PERTINENT HISTORY and DIAGNOSTIC FINDINGS: Pt is a right handed 77 year old male with past medical history of HLD, HTN, CAD with recent intra parenchymal hemorrhage on 07/25/2022. MRI (07/26/2022) revealed "known acute hemorrhage in the left thalamus measuring up to 2 cm and chronic small vessel ischemia with chronic microhemorrhages."   PAIN:  Are you having pain? No   FALLS: Has patient fallen in last 6 months?  No  LIVING ENVIRONMENT: Lives with: lives with their spouse Lives in: House/apartment  PLOF:  Level of assistance: Independent with ADLs, Independent with IADLs Employment: Full-time employment   PATIENT GOALS   to get better  SUBJECTIVE STATEMENT: "I have to wake up this morning, I can't think" Pt accompanied by: significant other who remained in the lobby  OBJECTIVE:   TODAY'S TREATMENT:  Skilled ST session focused on pt's cognitive communication goals. SLP facilitated the session by providing the following interventions:  Executive Function skills targeted thru use of semi-complex deductive reasoning tasks. [facility] 9: Verbal and Chief Operating Officer; Verbal Reasoning- Convergent Reasoning)  Page: 90 Maximal cues provided for inferring information, pt with difficulty recalling previously stated information within written cues   PATIENT EDUCATION: Education details: see above Person educated: Patient and Spouse Education method:  Explanation Education comprehension: needs further education  HOME EXERCISE PROGRAM:        Read out loud  Solve complex math problems  GOALS:  Goals reviewed with patient? Yes  SHORT TERM GOALS: Target date: 10 sessions  With Min  A, patient will describe visual scenes using 3 or more sentences at 95% accuracy. Baseline: Goal status: INITIAL   2.  Pt will report improved cognitive communication via PROM by 5 points at last ST session    Baseline:  Goal status: INITIAL  3.   With Min A, patient will recall functional verbal information 80% accuracy with use of compensations/strategies (repeats, rephrasing, notetaking, etc).   Baseline:  Goal status: INITIAL  4.  With Min A, patient will generate sentences with 3 or more words in response to a situation at 95% accuracy in order to increase ability to communicate basic wants and needs.    Baseline:  Goal status: INITIAL  LONG TERM GOALS: Target date: 11/07/2022  With Supervision A, patient will participate in complex conversation at 90% accuracy to increase ability to communicate complex thoughts, feelings, and needs.  Baseline:  Goal status: INITIAL  2.   With Supervision A, patient will demonstrate strategies for processing/recall (eg notetaking, paraphrasing) successfully by recalling >90% of details from 15 minutes verbal information.   Baseline:  Goal status: INITIAL  ASSESSMENT:  CLINICAL IMPRESSION: Patient is a 77 y.o. right handed male who was seen today for cognitive communication treatment session d/t recent left thalamic hemorrhage.See above treatment note for details.   OBJECTIVE IMPAIRMENTS include memory, executive functioning, expressive language, and aphasia. These impairments are limiting patient from return to work, managing medications, managing appointments, managing finances, household responsibilities, ADLs/IADLs, and effectively communicating at home and in community. Factors affecting potential to achieve goals and functional outcome are  N/A .Marland Kitchen Patient will benefit from skilled SLP services to address above impairments and improve overall function.  REHAB POTENTIAL: Excellent  PLAN: SLP FREQUENCY: 1-2x/week  SLP DURATION: 12  weeks  PLANNED INTERVENTIONS: Language facilitation, Cueing hierachy, Cognitive reorganization, Internal/external aids, Functional tasks, SLP instruction and feedback, Compensatory strategies, and Patient/family education   Ashleigh Luckow B. Dreama Saa, M.S., CCC-SLP, Tree surgeon Certified Brain Injury Specialist [facility]  [facility] Rehabilitation Services Office [phone] Ascom [phone] Fax [phone]

## 2022-09-12 NOTE — Therapy (Signed)
OUTPATIENT PHYSICAL THERAPY NEURO TREATMENT  Patient Name: Billy Kim MRN: [id number] DOB:[birth year], 77 y.o., male Today's Date: 09/12/2022   PCP: Joaquim Nam, MD  REFERRING PROVIDER: Joaquim Nam, MD   END OF SESSION:  PT End of Session - 09/12/22 0828     Visit Number 7    Number of Visits 16    Date for PT Re-Evaluation 10/10/22    Authorization Type [employer]    Progress Note Due on Visit 10    PT Start Time 0847    PT Stop Time 0928    PT Time Calculation (min) 41 min    Activity Tolerance Patient tolerated treatment well;No increased pain    Behavior During Therapy [facility] for tasks assessed/performed              Past Medical History:  Diagnosis Date   Coronary artery disease    a. 11/2019 Cath: Moderate proximal LAD disease (not hemodynamically significant) and CTO's of D1 and non-dominant RCA.   HLD (hyperlipidemia)    Hypertension    Intracerebral hemorrhage (HCC)    a. 07/2022 CT Head: acute intraparenchymal hemorrhage in the left thalamus without midline shift or mass effect.   Systolic murmur    a. 07/2022 Echo: EF 60-65%, no rwma, GrI DD, nl RV fxn, RVSP 15.28mmHg, no significant valvular disease.   Ulcerative colitis 06/1979   Remission for years   Past Surgical History:  Procedure Laterality Date   CARDIAC CATHETERIZATION  03/20/01   Cardiolite EF 55% 02/10/02   CHOLECYSTECTOMY N/A 07/09/2017   Procedure: LAPAROSCOPIC CHOLECYSTECTOMY;  Surgeon: Jimmye Norman, MD;  Location: [facility] OR;  Service: General;  Laterality: N/A;   COLONOSCOPY  multiple   ENDOSCOPIC RETROGRADE CHOLANGIOPANCREATOGRAPHY (ERCP) WITH PROPOFOL N/A 07/08/2017   Procedure: ENDOSCOPIC RETROGRADE CHOLANGIOPANCREATOGRAPHY (ERCP) WITH PROPOFOL;  Surgeon: Lynann Bologna, MD;  Location: [facility] ENDOSCOPY;  Service: Endoscopy;  Laterality: N/A;   INGUINAL HERNIA REPAIR  04/09/06   Bilateral   LAPAROSCOPIC APPENDECTOMY  03/1981   LEFT HEART CATH AND CORONARY ANGIOGRAPHY Left  12/11/2019   Procedure: LEFT HEART CATH AND CORONARY ANGIOGRAPHY;  Surgeon: Yvonne Kendall, MD;  Location: [facility] INVASIVE CV LAB;  Service: Cardiovascular;  Laterality: Left;   REMOVAL OF STONES  07/08/2017   Procedure: REMOVAL OF STONES;  Surgeon: Lynann Bologna, MD;  Location: [facility] ENDOSCOPY;  Service: Endoscopy;;   SPHINCTEROTOMY  07/08/2017   Procedure: Dennison Mascot;  Surgeon: Lynann Bologna, MD;  Location: [facility] ENDOSCOPY;  Service: Endoscopy;;   Patient Active Problem List   Diagnosis Date Noted   Intraparenchymal hemorrhage of brain (HCC) 07/31/2022   AKI (acute kidney injury) (HCC) 07/27/2022   Hypertensive emergency 07/27/2022   Nontraumatic subcortical hemorrhage of left cerebral hemisphere (HCC) 07/25/2022   Weakness 07/20/2022   Leg swelling 07/20/2022   Dizziness 11/03/2021   Abscess 08/07/2021   Actinic keratosis 08/07/2021   Melanocytic nevi of trunk 08/07/2021   Nevus of back 08/07/2021   Personal history of other malignant neoplasm of skin 08/07/2021   Medicare annual wellness visit, subsequent 11/09/2020   Coronary artery disease of native artery of native heart with stable angina pectoris (HCC) 12/17/2019   Mixed hyperlipidemia 12/17/2019   Accelerating angina (HCC) 12/11/2019   Shingles 03/01/2019   FH: prostate cancer 02/21/2017   Advance care planning 03/31/2014   PSA elevation 03/27/2013   Thrombocytopenia, unspecified (HCC) 03/27/2013   Acute cough 03/12/2013   ED (erectile dysfunction) 12/28/2011   Neoplasm of uncertain behavior of skin 12/27/2010  Exertional chest pain 01/05/2010   Essential hypertension 04/08/2007   HYPERTRIGLYCERIDEMIA 01/08/2007   GILBERT'S SYNDROME 01/08/2007   ULCERATIVE COLITIS 01/08/2007    ONSET DATE: 07/25/2022  REFERRING DIAG: Intraparenchymal hemorrhage of brain (HCC)   THERAPY DIAG:  Muscle weakness (generalized)  Other lack of coordination  Other abnormalities of gait and mobility  Unsteadiness on feet  Difficulty  in walking, not elsewhere classified  Rationale for Evaluation and Treatment: Rehabilitation  SUBJECTIVE:                                                                                                                                                                                             SUBJECTIVE STATEMENT:    Patient denies any falls. States he was challenged with speech therapy session just prior to PT arrival.      Pt accompanied by: self (wife in waiting zone)   PERTINENT HISTORY:  Per acute care PT evaluation on 07/27/2022: "Prior to admission pt was independent, working in [employer], and driving. On this date, pt presents with expressive communication deficits, R inattention, R hemiparesis, and impaired balance & gait. Pt requires min assist to ambulate and decreased ability to correct gait pattern to reduce fall risk. Pt with R inattention, bumping doorway x 2 despite PT educating throughout session." Pt DC to inpatient rehab with the following measures as of discharge on 08/04/2022: AMB 400-529ft at SBA, FGA 21/30, intervention emphasis on dynamic balance, gait training, and Rt foot clearance.  PAIN:  Are you having pain? No  PRECAUTIONS: Fall  WEIGHT BEARING RESTRICTIONS: No  FALLS: Has patient fallen in last 6 months? No  PATIENT GOALS: to walk straight, work on memory  OBJECTIVE:   TODAY'S TREATMENT:                                                                                                                              DATE: 09/12/22   THEREX:   Sit to stand without UE support x 10 reps (patient reports as fairly easy)   Standing calf raises (on 1/2 foam roll) 2  sets of 10 reps using 5# AW  Standing lunges (retro) with no UE support 5# AW x   Resistive gait x 100 feet with 5# AW   NMR:   Dynamic step up 5# AW with opp LE high knee march x 12 reps each LE   Obstacle course- walking up/down steps without rails, 8 cones- snaking around, on red mat, and  horizontal head turns walking in hallway.  MMT: 5/5 BLE hip flex/knee strength today.      PATIENT EDUCATION: Education details: Educated on instruction for tests and measures, outcome measures, and POC.  Person educated: Patient Education method: Medical illustrator Education comprehension: verbalized understanding  HOME EXERCISE PROGRAM: Access Code: [email or web address] URL: [email or web address] Date: 09/03/2022 Prepared by: Grier Rocher  Exercises - Standing Single Leg Stance with Counter Support  - 1 x daily - 7 x weekly - 3 sets - 4 reps - 15 hold - Romberg Stance with Eyes Closed  - 1 x daily - 7 x weekly - 3 sets - 3 reps - 15 hold - Standing Tandem Balance with Counter Support  - 1 x daily - 7 x weekly - 3 sets - 4 reps - 15 hold   GOALS: Goals reviewed with patient? No  SHORT TERM GOALS: Target date: 09/12/2022   Patient will be independent in home exercise program to improve strength/mobility for better functional independence with ADLs. Baseline: No HEP currently  Goal status: INITIAL     LONG TERM GOALS: Target date: 10/10/2022   1.  Patient will increase FOTO score to equal to or greater than  80   to demonstrate statistically significant improvement in mobility and quality of life.  Baseline: 73 Goal status: INITIAL   2.  Patient will increase FGA score by > 4 points to demonstrate decreased fall risk during functional activities. Baseline: 26 Goal status: INITIAL    3.   Patient will increase 10 meter walk test to >1.75m/s as to improve gait speed for better community ambulation and to reduce fall risk. Baseline: 12.37 sec, .81 m/s Goal status: INITIAL  4.   Patient will increase six minute walk test distance to >1200 for progression to advanced community ambulator and improve gait ability Baseline: 942 feet Goal status: INITIAL  5.   Patient will successfully navigate a simulated community environment/obstacle course while  successfully attending to R side obstacles without cueing from therapist. Baseline: assess 2nd session; 09/12/2022= Patient able to negotiate obstacle course and walking down hallway with 75% accuracy attending to right side - mild drift to right during hallway walk.  Goal status: ONGOING 6.   Patient will display an increase of LE functional strength of 4+/5 or better for various workplace tasks by carrying weighted objects greater distances without rest breaks, no LOB or any level of assist form therapist. Baseline:  09/12/2022- 5/5 B Hip flex/knee ext strength Goal status: MET 7.   Patient will show increased safety with floor transfers by showing consistent and proper transfer mechanics at Modified independent assist or better provided by therapist. Baseline: assess 2nd session; 09/12/2022- Patient able to perform floor transfers and crawling independent - no difficulty with half kneeling/tall kneeling for pushing himself up to standing or maneuvering himself to floor.  Goal status: MET    ASSESSMENT:  CLINICAL IMPRESSION: Treatment continued per plan of care. Address several of remaining goals- patient met his floor transfer and LE strength goal- although fatigued quickly with resistive strengthening today. He still presents with some inattention to  right side and will continue to progress with higher level balance and resistive LE strengthening - including trial of farmers carry next session. Pt will continue to benefit from skilled physical therapy intervention to address impairments, improve QOL, and attain therapy goals.    OBJECTIVE IMPAIRMENTS: Abnormal gait, decreased balance, decreased cognition, and decreased endurance.   ACTIVITY LIMITATIONS: carrying, lifting, squatting, and locomotion level  PARTICIPATION LIMITATIONS: driving, community activity, and occupation  PERSONAL FACTORS: Age, Past/current experiences, and Time since onset of injury/illness/exacerbation are also  affecting patient's functional outcome.   REHAB POTENTIAL: Good  CLINICAL DECISION MAKING: Stable/uncomplicated  EVALUATION COMPLEXITY: Low  PLAN:  PT FREQUENCY: 1-2x/week  PT DURATION: 8 weeks  PLANNED INTERVENTIONS: Therapeutic exercises, Therapeutic activity, Neuromuscular re-education, Balance training, Gait training, Patient/Family education, Self Care, Joint mobilization, Stair training, Cryotherapy, Moist heat, and Re-evaluation  PLAN FOR NEXT SESSION:   Resisted gait training or farmers carry High level dynamic balance and gait training.    Lenda Kelp, PT 09/12/2022, 9:54 AM  9:54 AM, 09/12/22 Physical Therapist - [place] [facility]  Outpatient Physical Therapy- Main Campus [phone]     Physical Therapist - [facility]  [facility]  9:54 AM 09/12/22

## 2022-09-14 ENCOUNTER — Other Ambulatory Visit: Payer: PPO

## 2022-09-14 ENCOUNTER — Ambulatory Visit: Payer: PPO | Admitting: Speech Pathology

## 2022-09-18 ENCOUNTER — Ambulatory Visit: Payer: PPO | Admitting: Occupational Therapy

## 2022-09-18 ENCOUNTER — Ambulatory Visit: Payer: PPO | Admitting: Physical Therapy

## 2022-09-18 ENCOUNTER — Ambulatory Visit: Payer: PPO | Admitting: Speech Pathology

## 2022-09-18 DIAGNOSIS — R262 Difficulty in walking, not elsewhere classified: Secondary | ICD-10-CM

## 2022-09-18 DIAGNOSIS — R278 Other lack of coordination: Secondary | ICD-10-CM

## 2022-09-18 DIAGNOSIS — I619 Nontraumatic intracerebral hemorrhage, unspecified: Secondary | ICD-10-CM

## 2022-09-18 DIAGNOSIS — M6281 Muscle weakness (generalized): Secondary | ICD-10-CM

## 2022-09-18 DIAGNOSIS — R41841 Cognitive communication deficit: Secondary | ICD-10-CM

## 2022-09-18 DIAGNOSIS — R2689 Other abnormalities of gait and mobility: Secondary | ICD-10-CM

## 2022-09-18 DIAGNOSIS — R2681 Unsteadiness on feet: Secondary | ICD-10-CM

## 2022-09-18 NOTE — Therapy (Addendum)
OUTPATIENT OCCUPATIONAL THERAPY NEURO TREATMENT NOTE  Patient Name: Billy Kim MRN: [id number] DOB:June 05, 1945, 77 y.o., male Today's Date: 09/18/2022  PCP: Billy Nam, MD REFERRING PROVIDER: Charlton Amor, PA-C  END OF SESSION:  OT End of Session - 09/18/22 1137     Visit Number 5    Number of Visits 12    Date for OT Re-Evaluation 11/14/22    Progress Note Due on Visit 10    OT Start Time 1015    OT Stop Time 1100    OT Time Calculation (min) 45 min    Activity Tolerance Patient tolerated treatment well    Behavior During Therapy [facility] for tasks assessed/performed            Past Medical History:  Diagnosis Date   Coronary artery disease    a. 11/2019 Cath: Moderate proximal LAD disease (not hemodynamically significant) and CTO's of D1 and non-dominant RCA.   HLD (hyperlipidemia)    Hypertension    Intracerebral hemorrhage (HCC)    a. 07/2022 CT Head: acute intraparenchymal hemorrhage in the left thalamus without midline shift or mass effect.   Systolic murmur    a. 07/2022 Echo: EF 60-65%, no rwma, GrI DD, nl RV fxn, RVSP 15.83mmHg, no significant valvular disease.   Ulcerative colitis 06/1979   Remission for years   Past Surgical History:  Procedure Laterality Date   CARDIAC CATHETERIZATION  03/20/01   Cardiolite EF 55% 02/10/02   CHOLECYSTECTOMY N/A 07/09/2017   Procedure: LAPAROSCOPIC CHOLECYSTECTOMY;  Surgeon: Jimmye Norman, MD;  Location: [facility] OR;  Service: General;  Laterality: N/A;   COLONOSCOPY  multiple   ENDOSCOPIC RETROGRADE CHOLANGIOPANCREATOGRAPHY (ERCP) WITH PROPOFOL N/A 07/08/2017   Procedure: ENDOSCOPIC RETROGRADE CHOLANGIOPANCREATOGRAPHY (ERCP) WITH PROPOFOL;  Surgeon: Lynann Bologna, MD;  Location: [facility] ENDOSCOPY;  Service: Endoscopy;  Laterality: N/A;   INGUINAL HERNIA REPAIR  04/09/06   Bilateral   LAPAROSCOPIC APPENDECTOMY  03/1981   LEFT HEART CATH AND CORONARY ANGIOGRAPHY Left 12/11/2019   Procedure: LEFT HEART CATH AND CORONARY  ANGIOGRAPHY;  Surgeon: Yvonne Kendall, MD;  Location: [facility] INVASIVE CV LAB;  Service: Cardiovascular;  Laterality: Left;   REMOVAL OF STONES  07/08/2017   Procedure: REMOVAL OF STONES;  Surgeon: Lynann Bologna, MD;  Location: [facility] ENDOSCOPY;  Service: Endoscopy;;   SPHINCTEROTOMY  07/08/2017   Procedure: Dennison Mascot;  Surgeon: Lynann Bologna, MD;  Location: [facility] ENDOSCOPY;  Service: Endoscopy;;   Patient Active Problem List   Diagnosis Date Noted   Intraparenchymal hemorrhage of brain (HCC) 07/31/2022   AKI (acute kidney injury) (HCC) 07/27/2022   Hypertensive emergency 07/27/2022   Nontraumatic subcortical hemorrhage of left cerebral hemisphere (HCC) 07/25/2022   Weakness 07/20/2022   Leg swelling 07/20/2022   Dizziness 11/03/2021   Abscess 08/07/2021   Actinic keratosis 08/07/2021   Melanocytic nevi of trunk 08/07/2021   Nevus of back 08/07/2021   Personal history of other malignant neoplasm of skin 08/07/2021   Medicare annual wellness visit, subsequent 11/09/2020   Coronary artery disease of native artery of native heart with stable angina pectoris (HCC) 12/17/2019   Mixed hyperlipidemia 12/17/2019   Accelerating angina (HCC) 12/11/2019   Shingles 03/01/2019   FH: prostate cancer 02/21/2017   Advance care planning 03/31/2014   PSA elevation 03/27/2013   Thrombocytopenia, unspecified (HCC) 03/27/2013   Acute cough 03/12/2013   ED (erectile dysfunction) 12/28/2011   Neoplasm of uncertain behavior of skin 12/27/2010   Exertional chest pain 01/05/2010   Essential hypertension 04/08/2007   HYPERTRIGLYCERIDEMIA 01/08/2007  GILBERT'S SYNDROME 01/08/2007   ULCERATIVE COLITIS 01/08/2007   ONSET DATE: 07/25/2022  REFERRING DIAG: Intraparenchymal hemorrhage of brain (HCC)  THERAPY DIAG:  Muscle weakness (generalized)  Other lack of coordination  Intraparenchymal hemorrhage of brain (HCC)  Rationale for Evaluation and Treatment: Rehabilitation  SUBJECTIVE:  SUBJECTIVE  STATEMENT: Pt reports that he is doing well today and he has been doing his theraputty exercises at home. Pt. Reports that he is doing much better with managing his medications.  Pt accompanied by: self  PERTINENT HISTORY: Pt. Is a 77 y.o. male who has been diagnosed with a Intraparenchymal hemorrhage of the brain (HCC). Other dx include AKI, hypertension, hyperlipidemia, and history of CAD and ulcerative colitis. Pt. Presents with expressive communication deficits, R inattention, and R hemiparesis and impaired balance.   PRECAUTIONS: No driving WEIGHT BEARING RESTRICTIONS: No  PAIN:  Are you having pain? No  FALLS: Has patient fallen in last 6 months? No  LIVING ENVIRONMENT: Lives with: lives with their spouse Lives in: 2 story home Stairs: Yes: Internal: 14 steps; and External: 4 steps; can reach both Has following equipment at home: None  PLOF: Independent  PATIENT GOALS: Work on improving memory, and balance on the R side  OBJECTIVE:   HAND DOMINANCE: Right  ADLs:  Eating: Independent however holds utensils differently with the right hand, grabs handle with entire hand Grooming: Independent UB Dressing: Independent LB Dressing: Takes longer to put on pants due to balance, independent with tying shoes and putting socks on when sitting down Toileting: Independent Bathing: Tub shower, standing, holds onto wall to keep balance Tub Shower transfers: Independent, hold onto wall Equipment: none  IADLs: Shopping: Does with spouse, pushes cart to help balance,  Light housekeeping: Wife does most of it now, increased time due to being cautious to ensure he does not lose his balance Meal Prep: Wife prepares all meals, able to go to fridge to get out a drink Community mobility: Uses grocery cart to balance Medication management: Wife sets them out each day for him, pt. Takes them independently Financial management: Spouse handles finance Handwriting: 100% legible  MOBILITY  STATUS: Independent however often has to hold onto things to ensure balance  POSTURE COMMENTS:  No Significant postural limitations Sitting balance:  WFL  ACTIVITY TOLERANCE: Activity tolerance: WFL  FUNCTIONAL OUTCOME MEASURES: FOTO: Eval: 98 TR:98  (FOTO goal is not indicated at this time)  UPPER EXTREMITY ROM:    Active ROM Right WFL Left WFL  Shoulder flexion    Shoulder abduction    Shoulder adduction    Shoulder extension    Shoulder internal rotation    Shoulder external rotation    Elbow flexion    Elbow extension    Wrist flexion    Wrist extension    Wrist ulnar deviation    Wrist radial deviation    Wrist pronation    Wrist supination    (Blank rows = not tested)  UPPER EXTREMITY MMT:     MMT Right eval Left eval  Shoulder flexion 5/5 5/5  Shoulder abduction 5/5 5/5  Shoulder adduction    Shoulder extension    Shoulder internal rotation    Shoulder external rotation    Middle trapezius    Lower trapezius    Elbow flexion 5/5 5/5  Elbow extension 5/5 5/5  Wrist flexion 5/5 5/5  Wrist extension    Wrist ulnar deviation    Wrist radial deviation    Wrist pronation    Wrist supination    (  Blank rows = not tested)  HAND FUNCTION: Grip strength: Right: 87 lbs; Left: 102 lbs, Lateral pinch: Right: 21 lbs, Left: 21 lbs, and 3 point pinch: Right: 17 lbs, Left: 18 lbs 09/10/22: Grip strength: Right: 96 lbs  COORDINATION: 9 Hole Peg test: Right: 29 sec; Left: 25 sec  SENSATION: Light touch: WFL Stereognosis: WFL  EDEMA: None  MUSCLE TONE: None  COGNITION: Overall cognitive status: Impaired memory  VISION: Subjective report: Pt. Reports he has not noticed any vision changes. Baseline vision: Wears glasses all the time Visual history:  None  VISION ASSESSMENT: To be further assessed in functional context  PERCEPTION: TBD  PRAXIS: WFL  TODAY'S TREATMENT:                                                                                                                               DATE: 09/18/2022 Therapeutic Exercise: Pt. Worked on R hand grip strength using the hand grip strengthener set to 28.9# to remove jumbo pegs from the peg board x 3 trials.    Self Care/Home Management:  Pt. Worked on simulated medication management task. Pt. Was able to problem solve through setting up small pill box following instructions that contained multiple steps.   Simulated work related tasks regarding note taking for a client address and inputting a new address into map quest app on phone, which is the app that pt uses.  Pt required extra time to navigate app and input directions, but was able to manage with 1 vc to input 2 out of 3 addresses accurately.    PATIENT EDUCATION: Education details: use of a pill organizer to reduce forgetfulness with taking meds/vitamins Person educated: Patient Education method: Explanation, Demonstration, and Verbal cues Education comprehension: verbalized understanding  HOME EXERCISE PROGRAM: Theraputty exercises for R hand strengthening   GOALS: Goals reviewed with patient? Yes  SHORT TERM GOALS: Target date: 10/03/2022    Pt. Will be independent with HEP for increasing R hand strength and coordination. Baseline: Currently no HEP Goal status: INITIAL  LONG TERM GOALS: Target date: 11/14/22  Pt. Will increase R grip strength by 5# to be able to securely hold and carry objects for ADL/IADL tasks.  Baseline: Grip strength: Right: 87 lbs; Left: 102 lbs Goal status: INITIAL  2.  Pt. Will improve R FMC skills by 2 seconds of speed to independently manipulate small objects during ADL/IADL tasks.  Baseline: Eval: 9 hole peg test: L: 25 seconds, R: 29 seconds Goal status: INITIAL  3.  Pt. Will demonstrate simulated pill box setup with supervision.  Baseline: Eval: Pts. Wife currently sets out medicine for Pt. To take, Pt. Has pill organizer but does not use it yet. Goal status: INITIAL  4.  Pt. Will  independently demonstrate holding utensil with proper form to increase engagement with eating tasks.   Baseline: Pt. Currently uses full grasp to hold utensil while eating rather than  Goal status: INITIAL   ASSESSMENT:  CLINICAL IMPRESSION: Pt reports that managing his medications has been going well and becoming easier for him to do. Pt. tolerated therapeutic exercise well. Pt. Was able to use the grip strengthener set to 28.9# to remove jumbo pegs from peg board. Pt. Was able to set up small pill box following instructions however Pt. had to read instructions multiple times when multiple steps were within one sentence to ensure proper placement of pill. Pt. practiced simulated work tasks with note taking and inputting addresses into a gps app on a cell phone. Pt required extra time and often had to delete mistyped words to retype. Pt. Required 1 VC throughout inputting 3 different addresses. Pt. Reported he is going to the doctor soon and is hoping he will be cleared to begin driving again. Pt. Was able to Pt will continue to benefit from skilled OT services to improve RUE strength, to improve R hand coordination to efficiently manipulate objects in order to increase  engagement in and independence in ADL/IADL tasks, and to practice simulated return to work tasks (note taking for new client appointments and use of gps practiced this date) as pt continues to be eager to return to work.    PERFORMANCE DEFICITS: in functional skills including ADLs, IADLs, coordination, strength, and UE functional use, cognitive skills including memory, and psychosocial skills including coping strategies, environmental adaptation, and routines and behaviors.   IMPAIRMENTS: are limiting patient from ADLs, IADLs, and work.   CO-MORBIDITIES: has no other co-morbidities that affects occupational performance. Patient will benefit from skilled OT to address above impairments and improve overall function.  MODIFICATION OR  ASSISTANCE TO COMPLETE EVALUATION: No modification of tasks or assist necessary to complete an evaluation.  OT OCCUPATIONAL PROFILE AND HISTORY: Detailed assessment: Review of records and additional review of physical, cognitive, psychosocial history related to current functional performance.  CLINICAL DECISION MAKING: Moderate - several treatment options, min-mod task modification necessary  REHAB POTENTIAL: Good  EVALUATION COMPLEXITY: Moderate    PLAN:  OT FREQUENCY: 1x/week  OT DURATION: 12 weeks  PLANNED INTERVENTIONS: self care/ADL training, therapeutic exercise, therapeutic activity, neuromuscular re-education, and patient/family education  RECOMMENDED OTHER SERVICES: ST, PT  CONSULTED AND AGREED WITH PLAN OF CARE: Patient  PLAN FOR NEXT SESSION: Initiate Treatment   Luretha Rued 09/18/2022, 11:39 AM  This entire session was performed under the direct supervision and direction of a licensed therapist. I have personally read, edited, and approve of the note as written.   Olegario Messier, MS, OTR/L   09/18/2022

## 2022-09-18 NOTE — Therapy (Signed)
OUTPATIENT SPEECH LANGUAGE PATHOLOGY  TREATMENT NOTE   Patient Name: Billy Kim MRN: [id number] DOB:[birth year], 77 y.o., male Today's Date: 09/18/2022  PCP: Crawford Givens, MD REFERRING PROVIDER: Claudette Laws, MD   End of Session - 09/18/22 0933     Visit Number 8    Number of Visits 25    Date for SLP Re-Evaluation 11/07/22    Authorization Type [employer]    Progress Note Due on Visit 10    SLP Start Time 0930    SLP Stop Time  1015    SLP Time Calculation (min) 45 min    Activity Tolerance Patient tolerated treatment well             No past medical history on file. The histories are not reviewed yet. Please review them in the "History" navigator section and refresh this SmartLink. Patient Active Problem List   Diagnosis Date Noted   Intraparenchymal hemorrhage of brain (HCC) 07/31/2022   AKI (acute kidney injury) (HCC) 07/27/2022   Hypertensive emergency 07/27/2022   Nontraumatic subcortical hemorrhage of left cerebral hemisphere (HCC) 07/25/2022   Weakness 07/20/2022   Leg swelling 07/20/2022   Dizziness 11/03/2021   Abscess 08/07/2021   Actinic keratosis 08/07/2021   Melanocytic nevi of trunk 08/07/2021   Nevus of back 08/07/2021   Personal history of other malignant neoplasm of skin 08/07/2021   Medicare annual wellness visit, subsequent 11/09/2020   Coronary artery disease of native artery of native heart with stable angina pectoris (HCC) 12/17/2019   Mixed hyperlipidemia 12/17/2019   Accelerating angina (HCC) 12/11/2019   Shingles 03/01/2019   FH: prostate cancer 02/21/2017   Advance care planning 03/31/2014   PSA elevation 03/27/2013   Thrombocytopenia, unspecified (HCC) 03/27/2013   Acute cough 03/12/2013   ED (erectile dysfunction) 12/28/2011   Neoplasm of uncertain behavior of skin 12/27/2010   Exertional chest pain 01/05/2010   Essential hypertension 04/08/2007   HYPERTRIGLYCERIDEMIA 01/08/2007   GILBERT'S SYNDROME 01/08/2007    ULCERATIVE COLITIS 01/08/2007    ONSET DATE: 07/25/2022   REFERRING DIAG: I61.9 (ICD-10-CM) - Intraparenchymal hemorrhage of brain (HCC)   THERAPY DIAG:  Cognitive communication deficit  Intraparenchymal hemorrhage of brain (HCC)  Rationale for Evaluation and Treatment Rehabilitation  SUBJECTIVE:   PERTINENT HISTORY and DIAGNOSTIC FINDINGS: Pt is a right handed 77 year old male with past medical history of HLD, HTN, CAD with recent intra parenchymal hemorrhage on 07/25/2022. MRI (07/26/2022) revealed "known acute hemorrhage in the left thalamus measuring up to 2 cm and chronic small vessel ischemia with chronic microhemorrhages."   PAIN:  Are you having pain? No   FALLS: Has patient fallen in last 6 months?  No  LIVING ENVIRONMENT: Lives with: lives with their spouse Lives in: House/apartment  PLOF:  Level of assistance: Independent with ADLs, Independent with IADLs Employment: Full-time employment   PATIENT GOALS   to get better  SUBJECTIVE STATEMENT: "I don't have the memory that I use to" Pt accompanied by: significant other who remained in the lobby  OBJECTIVE:   TODAY'S TREATMENT:  Skilled ST session focused on pt's cognitive communication goals. SLP facilitated the session by providing the following interventions:  Executive Function skills targeted thru use of semi-complex deductive grid activity with 2 constraints - 3x3 grid - pt benefited from minimal assistance to use problem solving strategies to solve task with 75% accuracy.    PATIENT EDUCATION: Education details: see above Person educated: Patient and Spouse Education method: Explanation Education comprehension: needs further education  HOME EXERCISE PROGRAM:        Read out loud  Solve complex math problems  GOALS:  Goals reviewed with patient? Yes  SHORT TERM GOALS: Target date: 10 sessions  With Min A, patient will describe visual scenes using 3 or more sentences at 95%  accuracy. Baseline: Goal status: INITIAL   2.  Pt will report improved cognitive communication via PROM by 5 points at last ST session    Baseline:  Goal status: INITIAL  3.   With Min A, patient will recall functional verbal information 80% accuracy with use of compensations/strategies (repeats, rephrasing, notetaking, etc).   Baseline:  Goal status: INITIAL  4.  With Min A, patient will generate sentences with 3 or more words in response to a situation at 95% accuracy in order to increase ability to communicate basic wants and needs.    Baseline:  Goal status: INITIAL  LONG TERM GOALS: Target date: 11/07/2022  With Supervision A, patient will participate in complex conversation at 90% accuracy to increase ability to communicate complex thoughts, feelings, and needs.  Baseline:  Goal status: INITIAL  2.   With Supervision A, patient will demonstrate strategies for processing/recall (eg notetaking, paraphrasing) successfully by recalling >90% of details from 15 minutes verbal information.   Baseline:  Goal status: INITIAL  ASSESSMENT:  CLINICAL IMPRESSION: Patient is a 77 y.o. right handed male who was seen today for cognitive communication treatment session d/t recent left thalamic hemorrhage. During today's session, pt reported some intermittent difficulty with memory prior to most recent CVA. See above treatment note for details.   OBJECTIVE IMPAIRMENTS include memory, executive functioning, expressive language, and aphasia. These impairments are limiting patient from return to work, managing medications, managing appointments, managing finances, household responsibilities, ADLs/IADLs, and effectively communicating at home and in community. Factors affecting potential to achieve goals and functional outcome are  N/A .Marland Kitchen Patient will benefit from skilled SLP services to address above impairments and improve overall function.  REHAB POTENTIAL: Excellent  PLAN: SLP FREQUENCY:  1-2x/week  SLP DURATION: 12 weeks  PLANNED INTERVENTIONS: Language facilitation, Cueing hierachy, Cognitive reorganization, Internal/external aids, Functional tasks, SLP instruction and feedback, Compensatory strategies, and Patient/family education   Edie Vallandingham B. Dreama Saa, M.S., CCC-SLP, Tree surgeon Certified Brain Injury Specialist [facility]  [facility] Office [phone] Ascom [phone] Fax [phone]

## 2022-09-18 NOTE — Therapy (Signed)
OUTPATIENT PHYSICAL THERAPY NEURO TREATMENT  Patient Name: Billy Kim MRN: [id number] DOB:1945/04/14, 77 y.o., male Today's Date: 09/18/2022   PCP: Joaquim Nam, MD  REFERRING PROVIDER: Joaquim Nam, MD   END OF SESSION:  PT End of Session - 09/18/22 1103     Visit Number 8    Number of Visits 16    Date for PT Re-Evaluation 10/10/22    Authorization Type [employer]    Progress Note Due on Visit 10    PT Start Time 1103    PT Stop Time 1145    PT Time Calculation (min) 42 min    Activity Tolerance Patient tolerated treatment well;No increased pain    Behavior During Therapy [facility] for tasks assessed/performed              Past Medical History:  Diagnosis Date   Coronary artery disease    a. 11/2019 Cath: Moderate proximal LAD disease (not hemodynamically significant) and CTO's of D1 and non-dominant RCA.   HLD (hyperlipidemia)    Hypertension    Intracerebral hemorrhage (HCC)    a. 07/2022 CT Head: acute intraparenchymal hemorrhage in the left thalamus without midline shift or mass effect.   Systolic murmur    a. 07/2022 Echo: EF 60-65%, no rwma, GrI DD, nl RV fxn, RVSP 15.89mmHg, no significant valvular disease.   Ulcerative colitis 06/1979   Remission for years   Past Surgical History:  Procedure Laterality Date   CARDIAC CATHETERIZATION  03/20/01   Cardiolite EF 55% 02/10/02   CHOLECYSTECTOMY N/A 07/09/2017   Procedure: LAPAROSCOPIC CHOLECYSTECTOMY;  Surgeon: Jimmye Norman, MD;  Location: [facility] OR;  Service: General;  Laterality: N/A;   COLONOSCOPY  multiple   ENDOSCOPIC RETROGRADE CHOLANGIOPANCREATOGRAPHY (ERCP) WITH PROPOFOL N/A 07/08/2017   Procedure: ENDOSCOPIC RETROGRADE CHOLANGIOPANCREATOGRAPHY (ERCP) WITH PROPOFOL;  Surgeon: Lynann Bologna, MD;  Location: [facility] ENDOSCOPY;  Service: Endoscopy;  Laterality: N/A;   INGUINAL HERNIA REPAIR  04/09/06   Bilateral   LAPAROSCOPIC APPENDECTOMY  03/1981   LEFT HEART CATH AND CORONARY ANGIOGRAPHY Left  12/11/2019   Procedure: LEFT HEART CATH AND CORONARY ANGIOGRAPHY;  Surgeon: Yvonne Kendall, MD;  Location: [facility] INVASIVE CV LAB;  Service: Cardiovascular;  Laterality: Left;   REMOVAL OF STONES  07/08/2017   Procedure: REMOVAL OF STONES;  Surgeon: Lynann Bologna, MD;  Location: [facility] ENDOSCOPY;  Service: Endoscopy;;   SPHINCTEROTOMY  07/08/2017   Procedure: Dennison Mascot;  Surgeon: Lynann Bologna, MD;  Location: [facility] ENDOSCOPY;  Service: Endoscopy;;   Patient Active Problem List   Diagnosis Date Noted   Intraparenchymal hemorrhage of brain (HCC) 07/31/2022   AKI (acute kidney injury) (HCC) 07/27/2022   Hypertensive emergency 07/27/2022   Nontraumatic subcortical hemorrhage of left cerebral hemisphere (HCC) 07/25/2022   Weakness 07/20/2022   Leg swelling 07/20/2022   Dizziness 11/03/2021   Abscess 08/07/2021   Actinic keratosis 08/07/2021   Melanocytic nevi of trunk 08/07/2021   Nevus of back 08/07/2021   Personal history of other malignant neoplasm of skin 08/07/2021   Medicare annual wellness visit, subsequent 11/09/2020   Coronary artery disease of native artery of native heart with stable angina pectoris (HCC) 12/17/2019   Mixed hyperlipidemia 12/17/2019   Accelerating angina (HCC) 12/11/2019   Shingles 03/01/2019   FH: prostate cancer 02/21/2017   Advance care planning 03/31/2014   PSA elevation 03/27/2013   Thrombocytopenia, unspecified (HCC) 03/27/2013   Acute cough 03/12/2013   ED (erectile dysfunction) 12/28/2011   Neoplasm of uncertain behavior of skin 12/27/2010  Exertional chest pain 01/05/2010   Essential hypertension 04/08/2007   HYPERTRIGLYCERIDEMIA 01/08/2007   GILBERT'S SYNDROME 01/08/2007   ULCERATIVE COLITIS 01/08/2007    ONSET DATE: 07/25/2022  REFERRING DIAG: Intraparenchymal hemorrhage of brain (HCC)   THERAPY DIAG:  Other lack of coordination  Other abnormalities of gait and mobility  Muscle weakness (generalized)  Unsteadiness on feet  Difficulty  in walking, not elsewhere classified  Rationale for Evaluation and Treatment: Rehabilitation  SUBJECTIVE:                                                                                                                                                                                             SUBJECTIVE STATEMENT:    Patient denies any falls. Pt reports that he had a good weekend, but unable to recall details from Saturday. Remembers going to church and multiple church members out sick.      Pt accompanied by: self (wife in waiting zone)   PERTINENT HISTORY:  Per acute care PT evaluation on 07/27/2022: "Prior to admission pt was independent, working in [employer], and driving. On this date, pt presents with expressive communication deficits, R inattention, R hemiparesis, and impaired balance & gait. Pt requires min assist to ambulate and decreased ability to correct gait pattern to reduce fall risk. Pt with R inattention, bumping doorway x 2 despite PT educating throughout session." Pt DC to inpatient rehab with the following measures as of discharge on 08/04/2022: AMB 400-538ft at SBA, FGA 21/30, intervention emphasis on dynamic balance, gait training, and Rt foot clearance.  PAIN:  Are you having pain? No  PRECAUTIONS: Fall  WEIGHT BEARING RESTRICTIONS: No  FALLS: Has patient fallen in last 6 months? No  PATIENT GOALS: to walk straight, work on memory  OBJECTIVE:   TODAY'S TREATMENT:                                                                                                                              DATE: 09/18/22   Gait training:  Performed in simulated community environment through hall of hospital, pover cemen sidewalk,  up/down cement ramp, over natural area covered in pine needles and to around raise garden. Performed multiple bouts >522ft each with CGA to supervision.   NMR:  standing on ball side of bosu 2x45sec without UE support and 3 x 20 sec with UE  support Standing on flat side of bosu: AP control x 8 each, lateral weihgt shift x 8 bil. Static stand without UE support 2 x 30sec   Standing on airex:  Blaze pod random; 4 pods, x , x 2 bouts CGA-min assist for weight shift R and L to allow SLS without LOB towards moving foot.   CGA throughout session unless otherwise stated.    PATIENT EDUCATION: Education details: Educated on instruction for tests and measures, outcome measures, and POC.  Person educated: Patient Education method: Medical illustrator Education comprehension: verbalized understanding  HOME EXERCISE PROGRAM: Access Code: [email or web address] URL: [email or web address] Date: 09/03/2022 Prepared by: Grier Rocher  Exercises - Standing Single Leg Stance with Counter Support  - 1 x daily - 7 x weekly - 3 sets - 4 reps - 15 hold - Romberg Stance with Eyes Closed  - 1 x daily - 7 x weekly - 3 sets - 3 reps - 15 hold - Standing Tandem Balance with Counter Support  - 1 x daily - 7 x weekly - 3 sets - 4 reps - 15 hold   GOALS: Goals reviewed with patient? No  SHORT TERM GOALS: Target date: 09/12/2022   Patient will be independent in home exercise program to improve strength/mobility for better functional independence with ADLs. Baseline: No HEP currently  Goal status: INITIAL     LONG TERM GOALS: Target date: 10/10/2022   1.  Patient will increase FOTO score to equal to or greater than  80   to demonstrate statistically significant improvement in mobility and quality of life.  Baseline: 73 Goal status: INITIAL   2.  Patient will increase FGA score by > 4 points to demonstrate decreased fall risk during functional activities. Baseline: 26 Goal status: INITIAL    3.   Patient will increase 10 meter walk test to >1.92m/s as to improve gait speed for better community ambulation and to reduce fall risk. Baseline: 12.37 sec, .81 m/s Goal status: INITIAL  4.   Patient will increase six minute  walk test distance to >1200 for progression to advanced community ambulator and improve gait ability Baseline: 942 feet Goal status: INITIAL  5.   Patient will successfully navigate a simulated community environment/obstacle course while successfully attending to R side obstacles without cueing from therapist. Baseline: assess 2nd session; 09/12/2022= Patient able to negotiate obstacle course and walking down hallway with 75% accuracy attending to right side - mild drift to right during hallway walk.  Goal status: ONGOING 6.   Patient will display an increase of LE functional strength of 4+/5 or better for various workplace tasks by carrying weighted objects greater distances without rest breaks, no LOB or any level of assist form therapist. Baseline:  09/12/2022- 5/5 B Hip flex/knee ext strength Goal status: MET 7.   Patient will show increased safety with floor transfers by showing consistent and proper transfer mechanics at Modified independent assist or better provided by therapist. Baseline: assess 2nd session; 09/12/2022- Patient able to perform floor transfers and crawling independent - no difficulty with half kneeling/tall kneeling for pushing himself up to standing or maneuvering himself to floor.  Goal status: MET    ASSESSMENT:  CLINICAL IMPRESSION: Treatment continued per plan of  care. Session focused on dynamic balance un functional context with gait through pine needles and up/down ramps with CGA-supervision assist; no LOB. Dynamic balance training also performed on bosu with emphasis on ankle strategy to correct LOB. Pt will continue to benefit from skilled physical therapy intervention to address impairments, improve QOL, and attain therapy goals.    OBJECTIVE IMPAIRMENTS: Abnormal gait, decreased balance, decreased cognition, and decreased endurance.   ACTIVITY LIMITATIONS: carrying, lifting, squatting, and locomotion level  PARTICIPATION LIMITATIONS: driving, community  activity, and occupation  PERSONAL FACTORS: Age, Past/current experiences, and Time since onset of injury/illness/exacerbation are also affecting patient's functional outcome.   REHAB POTENTIAL: Good  CLINICAL DECISION MAKING: Stable/uncomplicated  EVALUATION COMPLEXITY: Low  PLAN:  PT FREQUENCY: 1-2x/week  PT DURATION: 8 weeks  PLANNED INTERVENTIONS: Therapeutic exercises, Therapeutic activity, Neuromuscular re-education, Balance training, Gait training, Patient/Family education, Self Care, Joint mobilization, Stair training, Cryotherapy, Moist heat, and Re-evaluation  PLAN FOR NEXT SESSION:   Resisted gait training or farmers carry  High level dynamic balance and gait training.   Grier Rocher PT, DPT  Physical Therapist - [facility]  11:24 AM 09/18/22

## 2022-09-20 ENCOUNTER — Ambulatory Visit (INDEPENDENT_AMBULATORY_CARE_PROVIDER_SITE_OTHER): Payer: PPO | Admitting: Internal Medicine

## 2022-09-20 ENCOUNTER — Ambulatory Visit: Payer: PPO | Admitting: Speech Pathology

## 2022-09-20 ENCOUNTER — Ambulatory Visit: Payer: PPO

## 2022-09-20 ENCOUNTER — Encounter: Payer: Self-pay | Admitting: Internal Medicine

## 2022-09-20 VITALS — BP 134/78 | HR 71 | Temp 98.2°F | Ht 67.0 in | Wt 181.0 lb

## 2022-09-20 DIAGNOSIS — J329 Chronic sinusitis, unspecified: Secondary | ICD-10-CM | POA: Insufficient documentation

## 2022-09-20 DIAGNOSIS — J069 Acute upper respiratory infection, unspecified: Secondary | ICD-10-CM

## 2022-09-20 DIAGNOSIS — R051 Acute cough: Secondary | ICD-10-CM

## 2022-09-20 LAB — POC COVID19 BINAXNOW: SARS Coronavirus 2 Ag: NEGATIVE

## 2022-09-20 MED ORDER — BENZONATATE 200 MG PO CAPS: 200.0000 mg | ORAL_CAPSULE | Freq: Three times a day (TID) | ORAL | 0 refills | Status: DC | PRN

## 2022-09-20 NOTE — Progress Notes (Signed)
Subjective:    Patient ID: Billy Kim, male    DOB: 19-Oct-1945, 77 y.o.   MRN: [id number]  HPI Here with wife due to respiratory infection  Started 2 days ago Coughing and sore throat (got really bad) Tried nyquil for sleep---didn't get better Last night--did get 4 hours of sleep (better) Sore throat is better Cough is now productive (clear sputum) Has used tylenol also  Still recovering from minor stroke Going to PT/OT Concerned his BP is up due to the nyquil (170/109)  No fever No chills or sweats No ear pain No SOB  Current Outpatient Medications on File Prior to Visit  Medication Sig Dispense Refill   acetaminophen (TYLENOL) 325 MG tablet Take 2 tablets (650 mg total) by mouth every 4 (four) hours as needed for mild pain (or temp > 37.5 C (99.5 F)).     Ascorbic Acid (VITAMIN C) 1000 MG tablet Take 1,000 mg by mouth daily.     carvedilol (COREG) 3.125 MG tablet Take 1 tablet (3.125 mg total) by mouth 2 (two) times daily with a meal. 60 tablet 0   cholecalciferol (VITAMIN D3) 25 MCG (1000 UNIT) tablet Take 2 tablets (2,000 Units total) by mouth daily. 30 tablet 0   ezetimibe (ZETIA) 10 MG tablet Take 1 tablet (10 mg total) by mouth daily. 90 tablet 3   fluticasone (FLONASE) 50 MCG/ACT nasal spray Place 2 sprays into both nostrils daily. 16 g 0   icosapent Ethyl (VASCEPA) 1 g capsule Take 2 capsules (2 g total) by mouth 2 (two) times daily. 120 capsule 3   isosorbide mononitrate (IMDUR) 120 MG 24 hr tablet Take 1 tablet (120 mg total) by mouth daily. 30 tablet 0   lidocaine (LIDODERM) 5 % Place 2 patches onto the skin daily. Remove & Discard patch within 12 hours or as directed by MD 30 patch 0   losartan (COZAAR) 50 MG tablet Take 1 tablet (50 mg total) by mouth daily. 90 tablet 3   meclizine (ANTIVERT) 25 MG tablet Take 0.5-1 tablet (12.5 mg- 25 mg) by mouth every 6 hours as needed for dizziness 25 tablet 0   nitroGLYCERIN (NITROSTAT) 0.4 MG SL tablet Place 1 tablet (0.4  mg total) under the tongue every 5 (five) minutes as needed for chest pain. Maximum of 3 doses. 25 tablet 1   omeprazole (PRILOSEC) 20 MG capsule Take 20 mg by mouth daily as needed.     No current facility-administered medications on file prior to visit.    Allergies  Allergen Reactions   Amoxicillin Other (See Comments)    Caused headache Has patient had a PCN reaction causing immediate rash, facial/tongue/throat swelling, SOB or lightheadedness with hypotension: No Has patient had a PCN reaction causing severe rash involving mucus membranes or skin necrosis: No Has patient had a PCN reaction that required hospitalization: No Has patient had a PCN reaction occurring within the last 10 years: No If all of the above answers are "NO", then may proceed with Cephalosporin use.   Lyrica [Pregabalin]     Intolerant, worsening headache   Morphine Sulfate Other (See Comments)    headache   Neurontin [Gabapentin] Other (See Comments)    headache   Statins     myalgias   Sulfonamide Derivatives Other (See Comments)    headache    Past Medical History:  Diagnosis Date   Coronary artery disease    a. 11/2019 Cath: Moderate proximal LAD disease (not hemodynamically significant) and CTO's of  D1 and non-dominant RCA.   HLD (hyperlipidemia)    Hypertension    Intracerebral hemorrhage (HCC)    a. 07/2022 CT Head: acute intraparenchymal hemorrhage in the left thalamus without midline shift or mass effect.   Systolic murmur    a. 07/2022 Echo: EF 60-65%, no rwma, GrI DD, nl RV fxn, RVSP 15.51mmHg, no significant valvular disease.   Ulcerative colitis 06/1979   Remission for years    Past Surgical History:  Procedure Laterality Date   CARDIAC CATHETERIZATION  03/20/01   Cardiolite EF 55% 02/10/02   CHOLECYSTECTOMY N/A 07/09/2017   Procedure: LAPAROSCOPIC CHOLECYSTECTOMY;  Surgeon: Jimmye Norman, MD;  Location: [facility] OR;  Service: General;  Laterality: N/A;   COLONOSCOPY  multiple   ENDOSCOPIC  RETROGRADE CHOLANGIOPANCREATOGRAPHY (ERCP) WITH PROPOFOL N/A 07/08/2017   Procedure: ENDOSCOPIC RETROGRADE CHOLANGIOPANCREATOGRAPHY (ERCP) WITH PROPOFOL;  Surgeon: Lynann Bologna, MD;  Location: [facility] ENDOSCOPY;  Service: Endoscopy;  Laterality: N/A;   INGUINAL HERNIA REPAIR  04/09/06   Bilateral   LAPAROSCOPIC APPENDECTOMY  03/1981   LEFT HEART CATH AND CORONARY ANGIOGRAPHY Left 12/11/2019   Procedure: LEFT HEART CATH AND CORONARY ANGIOGRAPHY;  Surgeon: Yvonne Kendall, MD;  Location: [facility] INVASIVE CV LAB;  Service: Cardiovascular;  Laterality: Left;   REMOVAL OF STONES  07/08/2017   Procedure: REMOVAL OF STONES;  Surgeon: Lynann Bologna, MD;  Location: [facility] ENDOSCOPY;  Service: Endoscopy;;   SPHINCTEROTOMY  07/08/2017   Procedure: Dennison Mascot;  Surgeon: Lynann Bologna, MD;  Location: [facility] ENDOSCOPY;  Service: Endoscopy;;    Family History  Problem Relation Age of Onset   Cancer Mother        ? GYN, died after hysterectomy   Stroke Father    CAD Father    Hypertension Sister    Prostate cancer Brother    CAD Brother 76       CABG   Bladder Cancer Brother 35       Bladder   Hypertension Brother    Hypertension Brother    Hypertension Brother    Hypertension Brother    Hypertension Brother    Bone cancer Paternal Grandfather    Hypertension Son    Prostate cancer Nephew    Bladder Cancer Nephew    Colon cancer Neg Hx    Esophageal cancer Neg Hx    Rectal cancer Neg Hx    Stomach cancer Neg Hx     Social History   Socioeconomic History   Marital status: Married    Spouse name: Not on file   Number of children: 1   Years of education: Not on file   Highest education level: Not on file  Occupational History   Occupation: Heritage manager: SELF    Comment: [employer]  Tobacco Use   Smoking status: Never   Smokeless tobacco: Never  Vaping Use   Vaping status: Never Used  Substance and Sexual Activity   Alcohol use: No   Drug use: No   Sexual  activity: Never  Other Topics Concern   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   Social Determinants of Health   Financial Resource Strain: Low Risk  (08/04/2020)   Overall Financial Resource Strain (CARDIA)    Difficulty of Paying Living Expenses: Not hard at all  Food Insecurity: No Food Insecurity (08/04/2020)   Hunger Vital Sign    Worried About Running Out of  Food in the Last Year: Never true    Ran Out of Food in the Last Year: Never true  Transportation Needs: No Transportation Needs (08/04/2020)   PRAPARE - Administrator, Civil Service (Medical): No    Lack of Transportation (Non-Medical): No  Physical Activity: Inactive (08/04/2020)   Exercise Vital Sign    Days of Exercise per Week: 0 days    Minutes of Exercise per Session: 0 min  Stress: No Stress Concern Present (08/04/2020)   [employer] of Occupational Health - Occupational Stress Questionnaire    Feeling of Stress : Not at all  Social Connections: Not on file  Intimate Partner Violence: Not At Risk (08/04/2020)   Humiliation, Afraid, Rape, and Kick questionnaire    Fear of Current or Ex-Partner: No    Emotionally Abused: No    Physically Abused: No    Sexually Abused: No   Review of Systems No loss of smell or taste No N/V Eating okay    Objective:   Physical Exam Constitutional:      Appearance: Normal appearance.  HENT:     Head:     Comments: No sinus tenderness    Right Ear: Tympanic membrane and ear canal normal.     Left Ear: Tympanic membrane and ear canal normal.     Mouth/Throat:     Pharynx: No oropharyngeal exudate or posterior oropharyngeal erythema.  Pulmonary:     Effort: Pulmonary effort is normal.     Breath sounds: Normal breath sounds. No wheezing or rales.  Musculoskeletal:     Cervical back: Neck supple.  Lymphadenopathy:     Cervical: No cervical adenopathy.  Neurological:      Mental Status: He is alert.            Assessment & Plan:

## 2022-09-20 NOTE — Assessment & Plan Note (Signed)
COVID negative Discussed other viral etiology Supportive Rx---tylenol, OTC cough med (Rx for benzonatate) If worsens next week--would consider empiric antibiotic

## 2022-09-21 ENCOUNTER — Ambulatory Visit: Payer: PPO

## 2022-09-21 ENCOUNTER — Encounter: Payer: PPO | Admitting: Occupational Therapy

## 2022-09-24 ENCOUNTER — Ambulatory Visit: Payer: PPO | Admitting: Occupational Therapy

## 2022-09-24 ENCOUNTER — Ambulatory Visit: Payer: PPO

## 2022-09-24 ENCOUNTER — Other Ambulatory Visit: Payer: Self-pay | Admitting: Internal Medicine

## 2022-09-24 ENCOUNTER — Ambulatory Visit: Payer: PPO | Admitting: Speech Pathology

## 2022-09-24 NOTE — Telephone Encounter (Signed)
*  STAT* If patient is at the pharmacy, call can be transferred to refill team.   1. Which medications need to be refilled? (please list name of each medication and dose if known)   isosorbide mononitrate (IMDUR) 120 MG 24 hr table     2. Would you like to learn more about the convenience, safety, & potential cost savings by using the [facility]? No   3. Are you open to using the [facility] (Type [facility]. )No   4. Which pharmacy/location (including street and city if local pharmacy) is medication to be sent to? [id number] - [place], [place] - [address]    5. Do they need a 30 day or 90 day supply? 90 day   Pt is out of medication

## 2022-09-25 ENCOUNTER — Ambulatory Visit: Payer: PPO | Admitting: Neurology

## 2022-09-25 ENCOUNTER — Encounter: Payer: Self-pay | Admitting: Neurology

## 2022-09-25 VITALS — BP 144/83 | HR 63 | Ht 67.0 in | Wt 180.4 lb

## 2022-09-25 DIAGNOSIS — G3184 Mild cognitive impairment, so stated: Secondary | ICD-10-CM

## 2022-09-25 DIAGNOSIS — R413 Other amnesia: Secondary | ICD-10-CM

## 2022-09-25 DIAGNOSIS — I610 Nontraumatic intracerebral hemorrhage in hemisphere, subcortical: Secondary | ICD-10-CM

## 2022-09-25 MED ORDER — ISOSORBIDE MONONITRATE ER 120 MG PO TB24: 120.0000 mg | ORAL_TABLET | Freq: Every day | ORAL | 3 refills | Status: DC

## 2022-09-25 NOTE — Progress Notes (Signed)
[facility] [address] [place]. [place] [address] [phone]       OFFICE CONSULT NOTE  [name] Date of Birth:  17-Feb-1946 Medical Record Number:  [id number]   Referring MD: Claudette Laws  Reason for Referral: Thalamic hemorrhage  HPI: Billy Kim is a 77 year old pleasant Caucasian male seen today for initial office consultation visit for intracerebral hemorrhage.  He is accompanied by his wife.  History is obtained from them and review of electronic medical records.  I personally reviewed pertinent available imaging films in PACS.  He has past medical history of hypertension, hyperlipidemia, coronary artery disease, ulcerative colitis.  He presented on 6/65/24 at [facility] with sudden onset of word finding and speech difficulties, garbled speech and gait and balance blood pressure was elevated on admission at 180/98.  CT scan of the head showed a 1.8 x 1.4 x 1.2 cm left thalamic parenchymal hemorrhage with volume of 1.5 mL.  ICH score was 0.  NIH stroke scale was 6.  Patient was admitted to the ICU and blood pressure is tightly controlled.  Brain hemorrhage showed slight increase in size on follow-up CT scan in a few hours but subsequent MRI scan showed stable appearance of the hemorrhage without any underlying mass lesion or intraventricular extension no hydrocephalus.  Echocardiogram showed normal ejection fraction.  LDL cholesterol is 41 mg percent.  Hemoglobin A1c was 5.3.  He subsequently had a carotid ultrasound on 08/28/2022 which showed only 1-39% bilateral carotid stenosis.  Patient was discharged home with outpatient speech and physical patient with therapies.  He states is done well.  He still has some mild expressive aphasia and word finding difficulties.  He is still doing outpatient speech therapy which seems to be helping.  He still has some minimal right-sided weakness and drags his leg while walking.  He is still doing outpatient  physical and Occupational Therapy.  He is quite active at home and walks daily.  Wife is also noted some decrease in cognition and short-term memory difficulties since the brain hemorrhage but these are not progressive.  He still mostly independent in activities of daily living.  He has not been driving but wants to do so. ROS:   14 system review of systems is positive for speech difficulty, off-balance slurred speech all other systems negative  PMH:  Past Medical History:  Diagnosis Date   Coronary artery disease    a. 11/2019 Cath: Moderate proximal LAD disease (not hemodynamically significant) and CTO's of D1 and non-dominant RCA.   HLD (hyperlipidemia)    Hypertension    Intracerebral hemorrhage (HCC)    a. 07/2022 CT Head: acute intraparenchymal hemorrhage in the left thalamus without midline shift or mass effect.   Systolic murmur    a. 07/2022 Echo: EF 60-65%, no rwma, GrI DD, nl RV fxn, RVSP 15.87mmHg, no significant valvular disease.   Ulcerative colitis 06/1979   Remission for years    Social History:  Social History   Socioeconomic History   Marital status: Married    Spouse name: Not on file   Number of children: 1   Years of education: Not on file   Highest education level: Not on file  Occupational History   Occupation: OWNER    Employer: SELF    Comment: [employer]  Tobacco Use   Smoking status: Never   Smokeless tobacco: Never  Vaping Use   Vaping status: Never Used  Substance and Sexual Activity  Alcohol use: No   Drug use: No   Sexual activity: Never  Other Topics Concern   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   Social Determinants of Health   Financial Resource Strain: Low Risk  (08/04/2020)   Overall Financial Resource Strain (CARDIA)    Difficulty of Paying Living Expenses: Not hard at all  Food Insecurity: No Food Insecurity  (08/04/2020)   Hunger Vital Sign    Worried About Running Out of Food in the Last Year: Never true    Ran Out of Food in the Last Year: Never true  Transportation Needs: No Transportation Needs (08/04/2020)   PRAPARE - Administrator, Civil Service (Medical): No    Lack of Transportation (Non-Medical): No  Physical Activity: Inactive (08/04/2020)   Exercise Vital Sign    Days of Exercise per Week: 0 days    Minutes of Exercise per Session: 0 min  Stress: No Stress Concern Present (08/04/2020)   [employer] of Occupational Health - Occupational Stress Questionnaire    Feeling of Stress : Not at all  Social Connections: Not on file  Intimate Partner Violence: Not At Risk (08/04/2020)   Humiliation, Afraid, Rape, and Kick questionnaire    Fear of Current or Ex-Partner: No    Emotionally Abused: No    Physically Abused: No    Sexually Abused: No    Medications:   Current Outpatient Medications on File Prior to Visit  Medication Sig Dispense Refill   acetaminophen (TYLENOL) 325 MG tablet Take 2 tablets (650 mg total) by mouth every 4 (four) hours as needed for mild pain (or temp > 37.5 C (99.5 F)).     Ascorbic Acid (VITAMIN C) 1000 MG tablet Take 1,000 mg by mouth daily.     benzonatate (TESSALON) 200 MG capsule Take 1 capsule (200 mg total) by mouth 3 (three) times daily as needed for cough. 60 capsule 0   carvedilol (COREG) 3.125 MG tablet Take 1 tablet (3.125 mg total) by mouth 2 (two) times daily with a meal. 60 tablet 0   cholecalciferol (VITAMIN D3) 25 MCG (1000 UNIT) tablet Take 2 tablets (2,000 Units total) by mouth daily. 30 tablet 0   ezetimibe (ZETIA) 10 MG tablet Take 1 tablet (10 mg total) by mouth daily. 90 tablet 3   Garlic 500 MG CAPS Take 500 mg by mouth daily.     icosapent Ethyl (VASCEPA) 1 g capsule Take 2 capsules (2 g total) by mouth 2 (two) times daily. 120 capsule 3   isosorbide mononitrate (IMDUR) 120 MG 24 hr tablet Take 1 tablet (120 mg total) by  mouth daily. 30 tablet 0   lidocaine (LIDODERM) 5 % Place 2 patches onto the skin daily. Remove & Discard patch within 12 hours or as directed by MD 30 patch 0   losartan (COZAAR) 50 MG tablet Take 1 tablet (50 mg total) by mouth daily. 90 tablet 3   meclizine (ANTIVERT) 25 MG tablet Take 0.5-1 tablet (12.5 mg- 25 mg) by mouth every 6 hours as needed for dizziness 25 tablet 0   nitroGLYCERIN (NITROSTAT) 0.4 MG SL tablet Place 1 tablet (0.4 mg total) under the tongue every 5 (five) minutes as needed for chest pain. Maximum of 3 doses. 25 tablet 1   omeprazole (PRILOSEC) 20 MG capsule Take 20 mg by mouth daily as needed.     fluticasone (  FLONASE) 50 MCG/ACT nasal spray Place 2 sprays into both nostrils daily. (Patient not taking: Reported on 09/25/2022) 16 g 0   No current facility-administered medications on file prior to visit.    Allergies:   Allergies  Allergen Reactions   Amoxicillin Other (See Comments)    Caused headache Has patient had a PCN reaction causing immediate rash, facial/tongue/throat swelling, SOB or lightheadedness with hypotension: No Has patient had a PCN reaction causing severe rash involving mucus membranes or skin necrosis: No Has patient had a PCN reaction that required hospitalization: No Has patient had a PCN reaction occurring within the last 10 years: No If all of the above answers are "NO", then may proceed with Cephalosporin use.   Lyrica [Pregabalin]     Intolerant, worsening headache   Morphine Sulfate Other (See Comments)    headache   Neurontin [Gabapentin] Other (See Comments)    headache   Statins     myalgias   Sulfonamide Derivatives Other (See Comments)    headache    Physical Exam General: well developed, well nourished, seated, in no evident distress Head: head normocephalic and atraumatic.   Neck: supple with no carotid or supraclavicular bruits Cardiovascular: regular rate and rhythm, no murmurs Musculoskeletal: no deformity Skin:  no  rash/petichiae Vascular:  Normal pulses all extremities  Neurologic Exam Mental Status: Awake and fully alert. Oriented to place and time. Recent and remote memory intact. Attention span, concentration and fund of knowledge appropriate. Mood and affect appropriate.  Diminished recall 1/3.  Able to name 7 animals which can walk on 4 legs.  Clock drawing 4/4.  Speech is mostly fluent with occasional word hesitancy and word finding difficulties.  Good naming repetition and comprehension. Cranial Nerves: Fundoscopic exam reveals sharp disc margins. Pupils equal, briskly reactive to light. Extraocular movements full without nystagmus. Visual fields full to confrontation. Hearing intact. Facial sensation intact. Face, tongue, palate moves normally and symmetrically.  Motor: Normal bulk and tone. Normal strength in all tested extremity muscles.  Diminished fine finger movements on the right.  Orbits left or right upper extremity. Sensory.: intact to touch , pinprick , position and vibratory sensation.  Coordination: Rapid alternating movements normal in all extremities. Finger-to-nose and heel-to-shin performed accurately bilaterally. Gait and Station: Arises from chair without difficulty. Stance is normal. Gait demonstrates normal stride length and balance but diminished right arm swing and slight dragging of the right leg.. Unable to heel, toe and tandem walk without difficulty.  Reflexes: 1+ and symmetric. Toes downgoing.   NIHSS  1 Modified Rankin  2   ASSESSMENT: 77 year old Caucasian male with hypertensive left thalamic hemorrhage and June 2024 who is doing quite well with only mild residual speech and cognitive difficulties.     PLAN: I had a long d/w patient and his wife about his recent thalamic hemorrhage, risk for recurrent stroke/TIAs, personally independently reviewed imaging studies and stroke evaluation results and answered questions.Continue strict control of hypertension with blood  pressure goal below 130/90, diabetes with hemoglobin A1c goal below 6.5% and lipids with LDL cholesterol goal below 70 mg/dL. I also advised the patient to eat a healthy diet with plenty of whole grains, cereals, fruits and vegetables, exercise regularly and maintain ideal body weight .patient may start driving a little initially but somebody to see if he can drive safely.  I also encouraged him to increase participation in cognitively challenging activities like solving crossword puzzles, playing bridge and sudoku.  We also discussed memory compensation strategies.  Patient wants to drive and return back to work I advised him to do so only gradually and increase as tolerated.  Followup in the future with me in 6 months or call earlier if necessary Greater than 50% time during this 45-minute consultation visit was spent in counseling and coordination of care about his intracerebral hemorrhage and residual mild speech and cognitive difficulties and answering questions. Delia Heady, MD Note: This document was prepared with digital dictation and possible smart phrase technology. Any transcriptional errors that result from this process are unintentional.

## 2022-09-25 NOTE — Patient Instructions (Signed)
I had a long d/w patient and his wife about his recent thalamic hemorrhage, risk for recurrent stroke/TIAs, personally independently reviewed imaging studies and stroke evaluation results and answered questions.Continue strict control of hypertension with blood pressure goal below 130/90, diabetes with hemoglobin A1c goal below 6.5% and lipids with LDL cholesterol goal below 70 mg/dL. I also advised the patient to eat a healthy diet with plenty of whole grains, cereals, fruits and vegetables, exercise regularly and maintain ideal body weight .patient may start driving a little initially but somebody to see if he can drive safely.  I also encouraged him to increase participation in cognitively challenging activities like solving crossword puzzles, playing bridge and sudoku.  We also discussed memory compensation strategies.  Followup in the future with me in 6 months or call earlier if necessary  Memory Compensation Strategies  Use "WARM" strategy.  W= write it down  A= associate it  R= repeat it  M= make a mental note  2.   You can keep a Glass blower/designer.  Use a 3-ring notebook with sections for the following: calendar, important names and phone numbers,  medications, doctors' names/phone numbers, lists/reminders, and a section to journal what you did  each day.   3.    Use a calendar to write appointments down.  4.    Write yourself a schedule for the day.  This can be placed on the calendar or in a separate section of the Memory Notebook.  Keeping a  regular schedule can help memory.  5.    Use medication organizer with sections for each day or morning/evening pills.  You may need help loading it  6.    Keep a basket, or pegboard by the door.  Place items that you need to take out with you in the basket or on the pegboard.  You may also want to  include a message board for reminders.  7.    Use sticky notes.  Place sticky notes with reminders in a place where the task is performed.  For  example: " turn off the  stove" placed by the stove, "lock the door" placed on the door at eye level, " take your medications" on  the bathroom mirror or by the place where you normally take your medications.  8.    Use alarms/timers.  Use while cooking to remind yourself to check on food or as a reminder to take your medicine, or as a  reminder to make a call, or as a reminder to perform another task, etc.

## 2022-09-26 ENCOUNTER — Ambulatory Visit: Payer: PPO | Admitting: Cardiology

## 2022-09-26 ENCOUNTER — Encounter: Payer: Self-pay | Admitting: Family

## 2022-09-26 ENCOUNTER — Ambulatory Visit: Payer: PPO | Admitting: Family

## 2022-09-26 VITALS — BP 126/76 | HR 79 | Temp 98.0°F | Ht 67.0 in | Wt 177.6 lb

## 2022-09-26 DIAGNOSIS — J0100 Acute maxillary sinusitis, unspecified: Secondary | ICD-10-CM | POA: Diagnosis not present

## 2022-09-26 MED ORDER — DOXYCYCLINE HYCLATE 100 MG PO TABS
100.0000 mg | ORAL_TABLET | Freq: Two times a day (BID) | ORAL | 0 refills | Status: DC
Start: 2022-09-26 — End: 2022-10-04

## 2022-09-26 NOTE — Patient Instructions (Signed)
Start doxycycline  May continue Robitussin or [employer] as needed.  Ensure to take probiotics while on antibiotics and also for 2 weeks after completion. This can either be by eating yogurt daily or taking a probiotic supplement over the counter such as Culturelle.It is important to re-colonize the gut with good bacteria and also to prevent any diarrheal infections associated with antibiotic use.    I me know if you are not feeling

## 2022-09-26 NOTE — Assessment & Plan Note (Signed)
Based on duration of symptoms and exam, concern for bacterial sinusitis.  Start doxycycline.  Advised probiotics.  Patient will let me know if he does not feel better

## 2022-09-26 NOTE — Progress Notes (Signed)
Assessment & Plan:  Acute non-recurrent maxillary sinusitis Assessment & Plan: Based on duration of symptoms and exam, concern for bacterial sinusitis.  Start doxycycline.  Advised probiotics.  Patient will let me know if he does not feel better  Orders: -     Doxycycline Hyclate; Take 1 tablet (100 mg total) by mouth 2 (two) times daily for 7 days.  Dispense: 14 tablet; Refill: 0     Return precautions given.   Risks, benefits, and alternatives of the medications and treatment plan prescribed today were discussed, and patient expressed understanding.   Education regarding symptom management and diagnosis given to patient on AVS either electronically or printed.  Return if symptoms worsen or fail to improve.  Rennie Plowman, FNP  Subjective:    Patient ID: Billy Kim, male    DOB: 10/17/1945, 77 y.o.   MRN: [id number]  CC: Billy Kim is a 77 y.o. male who presents today for an acute visit.    HPI: Accompanied by wife  Complains of thick yellow nasal discharge, episodic cough  x10 days ago  Endorses sinus pressure and cough which is worse at bedtime. Sore throat has resolved.   No fever, sob, wheezing, ear pain  Negative covid   Using Delsym, tessalon, robitussin  Seen by dr Alphonsus Sias 09/20/22 who recommended supportive care and prescribed Tessalon.  H/o CAD, HTN, CVA ( 07/2022)  Crt 1.21   Allergies: Amoxicillin, Lyrica [pregabalin], Morphine sulfate, Neurontin [gabapentin], Statins, and Sulfonamide derivatives Current Outpatient Medications on File Prior to Visit  Medication Sig Dispense Refill   acetaminophen (TYLENOL) 325 MG tablet Take 2 tablets (650 mg total) by mouth every 4 (four) hours as needed for mild pain (or temp > 37.5 C (99.5 F)).     Ascorbic Acid (VITAMIN C) 1000 MG tablet Take 1,000 mg by mouth daily.     benzonatate (TESSALON) 200 MG capsule Take 1 capsule (200 mg total) by mouth 3 (three) times daily as needed for cough. 60 capsule 0    carvedilol (COREG) 3.125 MG tablet Take 1 tablet (3.125 mg total) by mouth 2 (two) times daily with a meal. 60 tablet 0   cholecalciferol (VITAMIN D3) 25 MCG (1000 UNIT) tablet Take 2 tablets (2,000 Units total) by mouth daily. 30 tablet 0   ezetimibe (ZETIA) 10 MG tablet Take 1 tablet (10 mg total) by mouth daily. 90 tablet 3   Garlic 500 MG CAPS Take 500 mg by mouth daily.     icosapent Ethyl (VASCEPA) 1 g capsule Take 2 capsules (2 g total) by mouth 2 (two) times daily. 120 capsule 3   isosorbide mononitrate (IMDUR) 120 MG 24 hr tablet Take 1 tablet (120 mg total) by mouth daily. 90 tablet 3   lidocaine (LIDODERM) 5 % Place 2 patches onto the skin daily. Remove & Discard patch within 12 hours or as directed by MD 30 patch 0   losartan (COZAAR) 50 MG tablet Take 1 tablet (50 mg total) by mouth daily. 90 tablet 3   meclizine (ANTIVERT) 25 MG tablet Take 0.5-1 tablet (12.5 mg- 25 mg) by mouth every 6 hours as needed for dizziness 25 tablet 0   nitroGLYCERIN (NITROSTAT) 0.4 MG SL tablet Place 1 tablet (0.4 mg total) under the tongue every 5 (five) minutes as needed for chest pain. Maximum of 3 doses. 25 tablet 1   omeprazole (PRILOSEC) 20 MG capsule Take 20 mg by mouth daily as needed.     fluticasone (FLONASE) 50 MCG/ACT  nasal spray Place 2 sprays into both nostrils daily. (Patient not taking: Reported on 09/25/2022) 16 g 0   No current facility-administered medications on file prior to visit.    Review of Systems  Constitutional:  Negative for chills and fever.  HENT:  Negative for sinus pressure and sore throat.   Respiratory:  Positive for cough. Negative for shortness of breath.   Cardiovascular:  Negative for chest pain, palpitations and leg swelling.  Gastrointestinal:  Negative for nausea and vomiting.      Objective:    BP 126/76   Pulse 79   Temp 98 F (36.7 C) (Oral)   Ht 5\' 7"  (1.702 m)   Wt 177 lb 9.6 oz (80.6 kg)   SpO2 99%   BMI 27.82 kg/m   BP Readings from Last 3  Encounters:  09/26/22 126/76  09/25/22 (!) 144/83  09/20/22 134/78   Wt Readings from Last 3 Encounters:  09/26/22 177 lb 9.6 oz (80.6 kg)  09/25/22 180 lb 6.4 oz (81.8 kg)  09/20/22 181 lb (82.1 kg)    Physical Exam Vitals reviewed.  Constitutional:      Appearance: He is well-developed.  HENT:     Head: Normocephalic and atraumatic.     Right Ear: Hearing, tympanic membrane, ear canal and external ear normal. No decreased hearing noted. No drainage, swelling or tenderness. No middle ear effusion. Tympanic membrane is not injected, erythematous or bulging.     Left Ear: Hearing, tympanic membrane, ear canal and external ear normal. No decreased hearing noted. No drainage, swelling or tenderness.  No middle ear effusion. Tympanic membrane is not injected, erythematous or bulging.     Nose:     Right Sinus: Maxillary sinus tenderness present. No frontal sinus tenderness.     Left Sinus: Maxillary sinus tenderness present. No frontal sinus tenderness.     Mouth/Throat:     Pharynx: Uvula midline. No oropharyngeal exudate or posterior oropharyngeal erythema.     Tonsils: No tonsillar abscesses.  Eyes:     Conjunctiva/sclera: Conjunctivae normal.  Cardiovascular:     Rate and Rhythm: Regular rhythm.     Heart sounds: Normal heart sounds.  Pulmonary:     Effort: Pulmonary effort is normal. No respiratory distress.     Breath sounds: Normal breath sounds. No wheezing, rhonchi or rales.  Lymphadenopathy:     Head:     Right side of head: No submental, submandibular, tonsillar, preauricular, posterior auricular or occipital adenopathy.     Left side of head: No submental, submandibular, tonsillar, preauricular, posterior auricular or occipital adenopathy.     Cervical: No cervical adenopathy.  Skin:    General: Skin is warm and dry.  Neurological:     Mental Status: He is alert.  Psychiatric:        Speech: Speech normal.        Behavior: Behavior normal.

## 2022-09-27 ENCOUNTER — Ambulatory Visit: Payer: PPO

## 2022-09-27 ENCOUNTER — Encounter: Payer: PPO | Admitting: Occupational Therapy

## 2022-09-27 ENCOUNTER — Ambulatory Visit: Payer: PPO | Admitting: Speech Pathology

## 2022-10-02 ENCOUNTER — Ambulatory Visit: Payer: PPO | Admitting: Speech Pathology

## 2022-10-02 ENCOUNTER — Ambulatory Visit: Payer: PPO | Attending: Physician Assistant | Admitting: Occupational Therapy

## 2022-10-02 ENCOUNTER — Ambulatory Visit: Payer: PPO

## 2022-10-02 DIAGNOSIS — R262 Difficulty in walking, not elsewhere classified: Secondary | ICD-10-CM

## 2022-10-02 DIAGNOSIS — R2681 Unsteadiness on feet: Secondary | ICD-10-CM

## 2022-10-02 DIAGNOSIS — R278 Other lack of coordination: Secondary | ICD-10-CM

## 2022-10-02 DIAGNOSIS — M6281 Muscle weakness (generalized): Secondary | ICD-10-CM | POA: Diagnosis not present

## 2022-10-02 DIAGNOSIS — R41841 Cognitive communication deficit: Secondary | ICD-10-CM | POA: Insufficient documentation

## 2022-10-02 DIAGNOSIS — I619 Nontraumatic intracerebral hemorrhage, unspecified: Secondary | ICD-10-CM | POA: Insufficient documentation

## 2022-10-02 DIAGNOSIS — R2689 Other abnormalities of gait and mobility: Secondary | ICD-10-CM | POA: Insufficient documentation
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
## 2022-10-02 NOTE — Therapy (Signed)
OUTPATIENT PHYSICAL THERAPY NEURO TREATMENT  Patient Name: Billy Kim MRN: [id number] DOB:Jun 11, 1945, 77 y.o., male Today's Date: 10/03/2022   PCP: Joaquim Nam, MD  REFERRING PROVIDER: Joaquim Nam, MD   END OF SESSION:  PT End of Session - 10/02/22 1406     Visit Number 9    Number of Visits 16    Date for PT Re-Evaluation 10/10/22    Authorization Type [employer] P[employer]    Progress Note Due on Visit 10    PT Start Time 1402    PT Stop Time 1442    PT Time Calculation (min) 40 min    Activity Tolerance Patient tolerated treatment well;No increased pain    Behavior During Therapy [facility] for tasks assessed/performed               Past Medical History:  Diagnosis Date   Coronary artery disease    a. 11/2019 Cath: Moderate proximal LAD disease (not hemodynamically significant) and CTO's of D1 and non-dominant RCA.   HLD (hyperlipidemia)    Hypertension    Intracerebral hemorrhage (HCC)    a. 07/2022 CT Head: acute intraparenchymal hemorrhage in the left thalamus without midline shift or mass effect.   Systolic murmur    a. 07/2022 Echo: EF 60-65%, no rwma, GrI DD, nl RV fxn, RVSP 15.36mmHg, no significant valvular disease.   Ulcerative colitis 06/1979   Remission for years   Past Surgical History:  Procedure Laterality Date   CARDIAC CATHETERIZATION  03/20/01   Cardiolite EF 55% 02/10/02   CHOLECYSTECTOMY N/A 07/09/2017   Procedure: LAPAROSCOPIC CHOLECYSTECTOMY;  Surgeon: Jimmye Norman, MD;  Location: [facility] OR;  Service: General;  Laterality: N/A;   COLONOSCOPY  multiple   ENDOSCOPIC RETROGRADE CHOLANGIOPANCREATOGRAPHY (ERCP) WITH PROPOFOL N/A 07/08/2017   Procedure: ENDOSCOPIC RETROGRADE CHOLANGIOPANCREATOGRAPHY (ERCP) WITH PROPOFOL;  Surgeon: Lynann Bologna, MD;  Location: [facility] ENDOSCOPY;  Service: Endoscopy;  Laterality: N/A;   INGUINAL HERNIA REPAIR  04/09/06   Bilateral   LAPAROSCOPIC APPENDECTOMY  03/1981   LEFT HEART CATH AND CORONARY ANGIOGRAPHY Left  12/11/2019   Procedure: LEFT HEART CATH AND CORONARY ANGIOGRAPHY;  Surgeon: Yvonne Kendall, MD;  Location: [facility] INVASIVE CV LAB;  Service: Cardiovascular;  Laterality: Left;   REMOVAL OF STONES  07/08/2017   Procedure: REMOVAL OF STONES;  Surgeon: Lynann Bologna, MD;  Location: [facility] ENDOSCOPY;  Service: Endoscopy;;   SPHINCTEROTOMY  07/08/2017   Procedure: Dennison Mascot;  Surgeon: Lynann Bologna, MD;  Location: [facility] ENDOSCOPY;  Service: Endoscopy;;   Patient Active Problem List   Diagnosis Date Noted   Sinusitis 09/20/2022   Intraparenchymal hemorrhage of brain (HCC) 07/31/2022   AKI (acute kidney injury) (HCC) 07/27/2022   Hypertensive emergency 07/27/2022   Nontraumatic subcortical hemorrhage of left cerebral hemisphere (HCC) 07/25/2022   Weakness 07/20/2022   Leg swelling 07/20/2022   Dizziness 11/03/2021   Abscess 08/07/2021   Actinic keratosis 08/07/2021   Melanocytic nevi of trunk 08/07/2021   Nevus of back 08/07/2021   Personal history of other malignant neoplasm of skin 08/07/2021   Medicare annual wellness visit, subsequent 11/09/2020   Coronary artery disease of native artery of native heart with stable angina pectoris (HCC) 12/17/2019   Mixed hyperlipidemia 12/17/2019   Accelerating angina (HCC) 12/11/2019   Shingles 03/01/2019   FH: prostate cancer 02/21/2017   Advance care planning 03/31/2014   PSA elevation 03/27/2013   Thrombocytopenia, unspecified (HCC) 03/27/2013   Acute cough 03/12/2013   ED (erectile dysfunction) 12/28/2011   Neoplasm of uncertain  behavior of skin 12/27/2010   Exertional chest pain 01/05/2010   Essential hypertension 04/08/2007   HYPERTRIGLYCERIDEMIA 01/08/2007   GILBERT'S SYNDROME 01/08/2007   ULCERATIVE COLITIS 01/08/2007    ONSET DATE: 07/25/2022  REFERRING DIAG: Intraparenchymal hemorrhage of brain (HCC)   THERAPY DIAG:  Muscle weakness (generalized)  Other lack of coordination  Other abnormalities of gait and  mobility  Unsteadiness on feet  Difficulty in walking, not elsewhere classified  Rationale for Evaluation and Treatment: Rehabilitation  SUBJECTIVE:                                                                                                                                                                                             SUBJECTIVE STATEMENT:    Patient reports feeling some lightheadness today but overall okay.     Pt accompanied by: self (wife in waiting zone)   PERTINENT HISTORY:  Per acute care PT evaluation on 07/27/2022: "Prior to admission pt was independent, working in [employer], and driving. On this date, pt presents with expressive communication deficits, R inattention, R hemiparesis, and impaired balance & gait. Pt requires min assist to ambulate and decreased ability to correct gait pattern to reduce fall risk. Pt with R inattention, bumping doorway x 2 despite PT educating throughout session." Pt DC to inpatient rehab with the following measures as of discharge on 08/04/2022: AMB 400-547ft at SBA, FGA 21/30, intervention emphasis on dynamic balance, gait training, and Rt foot clearance.  PAIN:  Are you having pain? No  PRECAUTIONS: Fall  WEIGHT BEARING RESTRICTIONS: No  FALLS: Has patient fallen in last 6 months? No  PATIENT GOALS: to walk straight, work on memory  OBJECTIVE:   TODAY'S TREATMENT:                                                                                                                              DATE: 10/02/2022  BP= 144/100 mmHg Left UE (sitting) and 135/92 mmHg standing- asymptomatic  THEREX:   Circuit: 2 rounds Step up onto 6 " block without UE support x 20 reps alt  LE (Slow and steady) Sit to stand x 10 reps without UE Support Ambulation in clinic - 160 feet    NMR:  Dynamic walking with horizontal head turning- Left to right x 80 feet x 2 (min veer to right initially) improved on 2nd trial.  Dynamic tandem standing on  airex beam hold 30 sec x 3 Dynamic marching on airex pad x 20 reps Dynamic step up/over 1/2 foam roll x 20 reps  CGA throughout session unless otherwise stated.    PATIENT EDUCATION: Education details: Exercise technique Person educated: Patient Education method: Medical illustrator Education comprehension: verbalized understanding  HOME EXERCISE PROGRAM: Access Code: [email or web address] URL: [email or web address] Date: 09/03/2022 Prepared by: Grier Rocher  Exercises - Standing Single Leg Stance with Counter Support  - 1 x daily - 7 x weekly - 3 sets - 4 reps - 15 hold - Romberg Stance with Eyes Closed  - 1 x daily - 7 x weekly - 3 sets - 3 reps - 15 hold - Standing Tandem Balance with Counter Support  - 1 x daily - 7 x weekly - 3 sets - 4 reps - 15 hold   GOALS: Goals reviewed with patient? No  SHORT TERM GOALS: Target date: 09/12/2022   Patient will be independent in home exercise program to improve strength/mobility for better functional independence with ADLs. Baseline: No HEP currently  Goal status: INITIAL     LONG TERM GOALS: Target date: 10/10/2022   1.  Patient will increase FOTO score to equal to or greater than  80   to demonstrate statistically significant improvement in mobility and quality of life.  Baseline: 73 Goal status: INITIAL   2.  Patient will increase FGA score by > 4 points to demonstrate decreased fall risk during functional activities. Baseline: 26 Goal status: INITIAL    3.   Patient will increase 10 meter walk test to >1.24m/s as to improve gait speed for better community ambulation and to reduce fall risk. Baseline: 12.37 sec, .81 m/s Goal status: INITIAL  4.   Patient will increase six minute walk test distance to >1200 for progression to advanced community ambulator and improve gait ability Baseline: 942 feet Goal status: INITIAL  5.   Patient will successfully navigate a simulated community environment/obstacle  course while successfully attending to R side obstacles without cueing from therapist. Baseline: assess 2nd session; 09/12/2022= Patient able to negotiate obstacle course and walking down hallway with 75% accuracy attending to right side - mild drift to right during hallway walk.  Goal status: ONGOING 6.   Patient will display an increase of LE functional strength of 4+/5 or better for various workplace tasks by carrying weighted objects greater distances without rest breaks, no LOB or any level of assist form therapist. Baseline:  09/12/2022- 5/5 B Hip flex/knee ext strength Goal status: MET 7.   Patient will show increased safety with floor transfers by showing consistent and proper transfer mechanics at Modified independent assist or better provided by therapist. Baseline: assess 2nd session; 09/12/2022- Patient able to perform floor transfers and crawling independent - no difficulty with half kneeling/tall kneeling for pushing himself up to standing or maneuvering himself to floor.  Goal status: MET    ASSESSMENT:  CLINICAL IMPRESSION: Patient challenged today overall with some report of fatigue with circuit training. He performed well with balance activities and no signifcant LOB except with tandem on beam.  Pt will continue to benefit from skilled physical therapy intervention to address impairments, improve QOL,  and attain therapy goals.    OBJECTIVE IMPAIRMENTS: Abnormal gait, decreased balance, decreased cognition, and decreased endurance.   ACTIVITY LIMITATIONS: carrying, lifting, squatting, and locomotion level  PARTICIPATION LIMITATIONS: driving, community activity, and occupation  PERSONAL FACTORS: Age, Past/current experiences, and Time since onset of injury/illness/exacerbation are also affecting patient's functional outcome.   REHAB POTENTIAL: Good  CLINICAL DECISION MAKING: Stable/uncomplicated  EVALUATION COMPLEXITY: Low  PLAN:  PT FREQUENCY: 1-2x/week  PT  DURATION: 8 weeks  PLANNED INTERVENTIONS: Therapeutic exercises, Therapeutic activity, Neuromuscular re-education, Balance training, Gait training, Patient/Family education, Self Care, Joint mobilization, Stair training, Cryotherapy, Moist heat, and Re-evaluation  PLAN FOR NEXT SESSION:    High level dynamic balance and gait training.   Lenda Kelp PT  Physical Therapist - [facility]  1:07 PM 10/03/22

## 2022-10-02 NOTE — Therapy (Unsigned)
OUTPATIENT SPEECH LANGUAGE PATHOLOGY  TREATMENT NOTE   Patient Name: Billy Kim MRN: [id number] DOB:Jun 30, 1945, 77 y.o., male Today's Date: 10/02/2022  PCP: Crawford Givens, MD REFERRING PROVIDER: Claudette Laws, MD   End of Session - 10/02/22 1431     Visit Number 9    Number of Visits 25    Date for SLP Re-Evaluation 11/07/22    Authorization Type [employer]    Progress Note Due on Visit 10    SLP Start Time 1445    SLP Stop Time  1530    SLP Time Calculation (min) 45 min    Activity Tolerance Patient tolerated treatment well             No past medical history on file. The histories are not reviewed yet. Please review them in the "History" navigator section and refresh this SmartLink. Patient Active Problem List   Diagnosis Date Noted   Sinusitis 09/20/2022   Intraparenchymal hemorrhage of brain (HCC) 07/31/2022   AKI (acute kidney injury) (HCC) 07/27/2022   Hypertensive emergency 07/27/2022   Nontraumatic subcortical hemorrhage of left cerebral hemisphere (HCC) 07/25/2022   Weakness 07/20/2022   Leg swelling 07/20/2022   Dizziness 11/03/2021   Abscess 08/07/2021   Actinic keratosis 08/07/2021   Melanocytic nevi of trunk 08/07/2021   Nevus of back 08/07/2021   Personal history of other malignant neoplasm of skin 08/07/2021   Medicare annual wellness visit, subsequent 11/09/2020   Coronary artery disease of native artery of native heart with stable angina pectoris (HCC) 12/17/2019   Mixed hyperlipidemia 12/17/2019   Accelerating angina (HCC) 12/11/2019   Shingles 03/01/2019   FH: prostate cancer 02/21/2017   Advance care planning 03/31/2014   PSA elevation 03/27/2013   Thrombocytopenia, unspecified (HCC) 03/27/2013   Acute cough 03/12/2013   ED (erectile dysfunction) 12/28/2011   Neoplasm of uncertain behavior of skin 12/27/2010   Exertional chest pain 01/05/2010   Essential hypertension 04/08/2007   HYPERTRIGLYCERIDEMIA 01/08/2007   GILBERT'S  SYNDROME 01/08/2007   ULCERATIVE COLITIS 01/08/2007    ONSET DATE: 07/25/2022   REFERRING DIAG: I61.9 (ICD-10-CM) - Intraparenchymal hemorrhage of brain (HCC)   THERAPY DIAG:  Cognitive communication deficit  Intraparenchymal hemorrhage of brain (HCC)  Rationale for Evaluation and Treatment Rehabilitation  SUBJECTIVE:   PERTINENT HISTORY and DIAGNOSTIC FINDINGS: Pt is a right handed 77 year old male with past medical history of HLD, HTN, CAD with recent intra parenchymal hemorrhage on 07/25/2022. MRI (07/26/2022) revealed "known acute hemorrhage in the left thalamus measuring up to 2 cm and chronic small vessel ischemia with chronic microhemorrhages."   PAIN:  Are you having pain? No   FALLS: Has patient fallen in last 6 months?  No  LIVING ENVIRONMENT: Lives with: lives with their spouse Lives in: House/apartment  PLOF:  Level of assistance: Independent with ADLs, Independent with IADLs Employment: Full-time employment   PATIENT GOALS   to get better  SUBJECTIVE STATEMENT: Pt reports continued struggle with recent illness Pt accompanied by: self  OBJECTIVE:   TODAY'S TREATMENT:  Skilled ST session focused on pt's cognitive communication goals. SLP facilitated the session by providing the following interventions:  Verbal organization targeted thru complex conversation surrounding recent return to driving and recent service call for work. Pt was Mod I for use of meta-cognitive strategies.    PATIENT EDUCATION: Education details: see above Person educated: Patient and Spouse Education method: Explanation Education comprehension: needs further education  HOME EXERCISE PROGRAM:        Read  out loud  Solve complex math problems  GOALS:  Goals reviewed with patient? Yes  SHORT TERM GOALS: Target date: 10 sessions  With Min A, patient will describe visual scenes using 3 or more sentences at 95% accuracy. Baseline: Goal status: INITIAL   2.  Pt will report  improved cognitive communication via PROM by 5 points at last ST session    Baseline:  Goal status: INITIAL  3.   With Min A, patient will recall functional verbal information 80% accuracy with use of compensations/strategies (repeats, rephrasing, notetaking, etc).   Baseline:  Goal status: INITIAL  4.  With Min A, patient will generate sentences with 3 or more words in response to a situation at 95% accuracy in order to increase ability to communicate basic wants and needs.    Baseline:  Goal status: INITIAL  LONG TERM GOALS: Target date: 11/07/2022  With Supervision A, patient will participate in complex conversation at 90% accuracy to increase ability to communicate complex thoughts, feelings, and needs.  Baseline:  Goal status: INITIAL  2.   With Supervision A, patient will demonstrate strategies for processing/recall (eg notetaking, paraphrasing) successfully by recalling >90% of details from 15 minutes verbal information.   Baseline:  Goal status: INITIAL  ASSESSMENT:  CLINICAL IMPRESSION: Patient is a 77 y.o. right handed male who was seen today for cognitive communication treatment session d/t recent left thalamic hemorrhage. Pt reports recent release for driving with someone else present in vehicle.  Although he also recently completed a service call, he states that it took longer than he anticipated to complete and that he grew tired of driving which surprised him. See above treatment note for details.   OBJECTIVE IMPAIRMENTS include memory, executive functioning, expressive language, and aphasia. These impairments are limiting patient from return to work, managing medications, managing appointments, managing finances, household responsibilities, ADLs/IADLs, and effectively communicating at home and in community. Factors affecting potential to achieve goals and functional outcome are  N/A .Marland Kitchen Patient will benefit from skilled SLP services to address above impairments and improve  overall function.  REHAB POTENTIAL: Excellent  PLAN: SLP FREQUENCY: 1-2x/week  SLP DURATION: 12 weeks  PLANNED INTERVENTIONS: Language facilitation, Cueing hierachy, Cognitive reorganization, Internal/external aids, Functional tasks, SLP instruction and feedback, Compensatory strategies, and Patient/family education   Kieth Hartis B. Dreama Saa, M.S., CCC-SLP, Tree surgeon Certified Brain Injury Specialist [address]  [facility] Rehabilitation Services Office [phone] Ascom [phone] Fax [phone]

## 2022-10-03 ENCOUNTER — Ambulatory Visit: Payer: PPO | Admitting: Nurse Practitioner

## 2022-10-04 ENCOUNTER — Ambulatory Visit: Payer: PPO

## 2022-10-04 ENCOUNTER — Ambulatory Visit: Payer: PPO | Admitting: Speech Pathology

## 2022-10-04 ENCOUNTER — Encounter: Payer: PPO | Admitting: Occupational Therapy

## 2022-10-04 ENCOUNTER — Ambulatory Visit (INDEPENDENT_AMBULATORY_CARE_PROVIDER_SITE_OTHER): Payer: PPO | Admitting: Family Medicine

## 2022-10-04 ENCOUNTER — Encounter: Payer: Self-pay | Admitting: Family Medicine

## 2022-10-04 VITALS — BP 122/70 | HR 71 | Temp 98.1°F | Ht 67.0 in | Wt 178.0 lb

## 2022-10-04 DIAGNOSIS — R2689 Other abnormalities of gait and mobility: Secondary | ICD-10-CM

## 2022-10-04 DIAGNOSIS — M6281 Muscle weakness (generalized): Secondary | ICD-10-CM | POA: Diagnosis not present

## 2022-10-04 DIAGNOSIS — R262 Difficulty in walking, not elsewhere classified: Secondary | ICD-10-CM

## 2022-10-04 DIAGNOSIS — R278 Other lack of coordination: Secondary | ICD-10-CM

## 2022-10-04 DIAGNOSIS — R41841 Cognitive communication deficit: Secondary | ICD-10-CM

## 2022-10-04 DIAGNOSIS — R059 Cough, unspecified: Secondary | ICD-10-CM

## 2022-10-04 DIAGNOSIS — I619 Nontraumatic intracerebral hemorrhage, unspecified: Secondary | ICD-10-CM

## 2022-10-04 DIAGNOSIS — J0100 Acute maxillary sinusitis, unspecified: Secondary | ICD-10-CM

## 2022-10-04 DIAGNOSIS — R2681 Unsteadiness on feet: Secondary | ICD-10-CM

## 2022-10-04 MED ORDER — FISH OIL 1000 MG PO CAPS: 1000.0000 mg | ORAL_CAPSULE | Freq: Every day | ORAL | Status: AC

## 2022-10-04 MED ORDER — BENZONATATE 200 MG PO CAPS: 200.0000 mg | ORAL_CAPSULE | Freq: Three times a day (TID) | ORAL | 1 refills | Status: DC | PRN

## 2022-10-04 MED ORDER — DOXYCYCLINE HYCLATE 100 MG PO TABS
100.0000 mg | ORAL_TABLET | Freq: Two times a day (BID) | ORAL | 0 refills | Status: AC
Start: 2022-10-04 — End: 2022-10-11

## 2022-10-04 NOTE — Therapy (Signed)
OUTPATIENT SPEECH LANGUAGE PATHOLOGY  TREATMENT NOTE 10th VISIT PROGRESS NOTE   Patient Name: Billy Kim MRN: [id number] DOB:05/21/1945, 77 y.o., male Today's Date: 10/04/2022  PCP: Crawford Givens, MD REFERRING PROVIDER: Claudette Laws, MD  Speech Therapy Progress Note  Dates of Reporting Period: 08/15/2022 to 10/04/2022  Objective: Patient has been seen for 10 speech therapy sessions this reporting period targeting cognitive communication. Patient is making progress toward LTGs and met 4 of 5 STGs this reporting period. See skilled intervention, clinical impressions, and goals below for details.    End of Session - 10/04/22 1417     Visit Number 10    Number of Visits 25    Date for SLP Re-Evaluation 11/07/22    Authorization Type [employer]    Progress Note Due on Visit 10    SLP Start Time 1445    SLP Stop Time  1530    SLP Time Calculation (min) 45 min    Activity Tolerance Patient tolerated treatment well             No past medical history on file. The histories are not reviewed yet. Please review them in the "History" navigator section and refresh this SmartLink. Patient Active Problem List   Diagnosis Date Noted   Sinusitis 09/20/2022   Intraparenchymal hemorrhage of brain (HCC) 07/31/2022   AKI (acute kidney injury) (HCC) 07/27/2022   Hypertensive emergency 07/27/2022   Nontraumatic subcortical hemorrhage of left cerebral hemisphere (HCC) 07/25/2022   Weakness 07/20/2022   Leg swelling 07/20/2022   Dizziness 11/03/2021   Abscess 08/07/2021   Actinic keratosis 08/07/2021   Melanocytic nevi of trunk 08/07/2021   Nevus of back 08/07/2021   Personal history of other malignant neoplasm of skin 08/07/2021   Medicare annual wellness visit, subsequent 11/09/2020   Coronary artery disease of native artery of native heart with stable angina pectoris (HCC) 12/17/2019   Mixed hyperlipidemia 12/17/2019   Accelerating angina (HCC) 12/11/2019   Shingles  03/01/2019   FH: prostate cancer 02/21/2017   Advance care planning 03/31/2014   PSA elevation 03/27/2013   Thrombocytopenia, unspecified (HCC) 03/27/2013   Cough 03/12/2013   ED (erectile dysfunction) 12/28/2011   Neoplasm of uncertain behavior of skin 12/27/2010   Exertional chest pain 01/05/2010   Essential hypertension 04/08/2007   HYPERTRIGLYCERIDEMIA 01/08/2007   GILBERT'S SYNDROME 01/08/2007   ULCERATIVE COLITIS 01/08/2007    ONSET DATE: 07/25/2022   REFERRING DIAG: I61.9 (ICD-10-CM) - Intraparenchymal hemorrhage of brain (HCC)   THERAPY DIAG:  Cognitive communication deficit  Intraparenchymal hemorrhage of brain (HCC)  Rationale for Evaluation and Treatment Rehabilitation  SUBJECTIVE:   PERTINENT HISTORY and DIAGNOSTIC FINDINGS: Pt is a right handed 77 year old male with past medical history of HLD, HTN, CAD with recent intra parenchymal hemorrhage on 07/25/2022. MRI (07/26/2022) revealed "known acute hemorrhage in the left thalamus measuring up to 2 cm and chronic small vessel ischemia with chronic microhemorrhages."   PAIN:  Are you having pain? No   FALLS: Has patient fallen in last 6 months?  No  LIVING ENVIRONMENT: Lives with: lives with their spouse Lives in: House/apartment  PLOF:  Level of assistance: Independent with ADLs, Independent with IADLs Employment: Full-time employment   PATIENT GOALS   to get better  SUBJECTIVE STATEMENT: Pt reports continued struggle with recent illness Pt accompanied by: self  OBJECTIVE:   TODAY'S TREATMENT:  Skilled ST session focused on pt's cognitive communication goals. SLP facilitated the session by providing the following interventions:  Pt  was Mod I to supervision A to complete semi-complex medication management activity. Pt was also Mod I when solving semi-complex reasoning tasks using self-monitoring, working memory and reasoning.    PATIENT EDUCATION: Education details: see above Person educated:  Patient and Spouse Education method: Explanation Education comprehension: needs further education  HOME EXERCISE PROGRAM:        Read out loud  Solve complex math problems  GOALS:  Goals reviewed with patient? Yes  SHORT TERM GOALS: Target date: 10 sessions  Updated 10/04/2022 With Min A, patient will describe visual scenes using 3 or more sentences at 95% accuracy. Baseline: Goal status: INITIAL: MET - upgraded to With supervision A, patient will describe visual scenes using 3 or more sentences at 95% accuracy.   2.  Pt will report improved cognitive communication via PROM by 5 points at last ST session    Baseline:  Goal status: INITIAL; progress made  3.   With Min A, patient will recall functional verbal information 80% accuracy with use of compensations/strategies (repeats, rephrasing, notetaking, etc).   Baseline:  Goal status: INITIAL; MET; upgraded to  With supervision A, patient will recall functional verbal information 80% accuracy with use of compensations/strategies (repeats, rephrasing, notetaking, etc).   4.  With Min A, patient will generate sentences with 3 or more words in response to a situation at 95% accuracy in order to increase ability to communicate basic wants and needs.    Baseline:  Goal status: INITIAL: MET upgraded to With supervision A, patient will generate sentences with 3 or more words in response to a situation at 95% accuracy in order to increase ability to communicate basic wants and needs.   LONG TERM GOALS: Target date: 11/07/2022  With Supervision A, patient will participate in complex conversation at 90% accuracy to increase ability to communicate complex thoughts, feelings, and needs.  Baseline:  Goal status: INITIAL: ongoing  2.   With Supervision A, patient will demonstrate strategies for processing/recall (eg notetaking, paraphrasing) successfully by recalling >90% of details from 15 minutes verbal information.   Baseline:  Goal status:  INITIAL: ongoing  ASSESSMENT:  CLINICAL IMPRESSION: Patient is a 77 y.o. right handed male who was seen today for cognitive communication treatment session d/t recent left thalamic hemorrhage. Pt has eagerly attended all sessions and is making good progress towards his goals of increased functional independence.  See above treatment note for details.   OBJECTIVE IMPAIRMENTS include memory, executive functioning, expressive language, and aphasia. These impairments are limiting patient from return to work, managing medications, managing appointments, managing finances, household responsibilities, ADLs/IADLs, and effectively communicating at home and in community. Factors affecting potential to achieve goals and functional outcome are  N/A .Marland Kitchen Patient will benefit from skilled SLP services to address above impairments and improve overall function.  REHAB POTENTIAL: Excellent  PLAN: SLP FREQUENCY: 1-2x/week  SLP DURATION: 12 weeks  PLANNED INTERVENTIONS: Language facilitation, Cueing hierachy, Cognitive reorganization, Internal/external aids, Functional tasks, SLP instruction and feedback, Compensatory strategies, and Patient/family education   Deavon Podgorski B. Dreama Saa, M.S., CCC-SLP, Tree surgeon Certified Brain Injury Specialist [facility]  [facility] Rehabilitation Services Office [phone] Ascom [phone] Fax [phone]

## 2022-10-04 NOTE — Assessment & Plan Note (Signed)
Likely post infection cough.  He may continue to improve off doxy.  Hold doxy for now, restart if worse or if not better soon.   Use tessalon for the cough.   Ctab. Okay for outpatient f/u.  He agrees with plan.

## 2022-10-04 NOTE — Patient Instructions (Addendum)
Try to increase fish oil as tolerated.  Take care.  Glad to see you. Hold doxy for now, restart if worse or if not better soon.   Use tessalon for the cough.

## 2022-10-04 NOTE — Therapy (Signed)
OUTPATIENT PHYSICAL THERAPY NEURO TREATMENT/DISCHARGE SUMMARY/Physical Therapy Progress Note   Dates of reporting period  08/15/2022   to   10/04/2022   Patient Name: Billy Kim MRN: [id number] DOB:06/10/1945, 77 y.o., male Today's Date: 10/05/2022   PCP: Joaquim Nam, MD  REFERRING PROVIDER: Joaquim Nam, MD   END OF SESSION:  PT End of Session - 10/04/22 1450     Visit Number 10    Number of Visits 16    Date for PT Re-Evaluation 10/10/22    Authorization Type [employer]    Progress Note Due on Visit 20    PT Start Time 1449    PT Stop Time 1528    PT Time Calculation (min) 39 min    Activity Tolerance Patient tolerated treatment well;No increased pain    Behavior During Therapy [facility] for tasks assessed/performed               Past Medical History:  Diagnosis Date   Coronary artery disease    a. 11/2019 Cath: Moderate proximal LAD disease (not hemodynamically significant) and CTO's of D1 and non-dominant RCA.   HLD (hyperlipidemia)    Hypertension    Intracerebral hemorrhage (HCC)    a. 07/2022 CT Head: acute intraparenchymal hemorrhage in the left thalamus without midline shift or mass effect.   Systolic murmur    a. 07/2022 Echo: EF 60-65%, no rwma, GrI DD, nl RV fxn, RVSP 15.82mmHg, no significant valvular disease.   Ulcerative colitis 06/1979   Remission for years   Past Surgical History:  Procedure Laterality Date   CARDIAC CATHETERIZATION  03/20/01   Cardiolite EF 55% 02/10/02   CHOLECYSTECTOMY N/A 07/09/2017   Procedure: LAPAROSCOPIC CHOLECYSTECTOMY;  Surgeon: Jimmye Norman, MD;  Location: [facility] OR;  Service: General;  Laterality: N/A;   COLONOSCOPY  multiple   ENDOSCOPIC RETROGRADE CHOLANGIOPANCREATOGRAPHY (ERCP) WITH PROPOFOL N/A 07/08/2017   Procedure: ENDOSCOPIC RETROGRADE CHOLANGIOPANCREATOGRAPHY (ERCP) WITH PROPOFOL;  Surgeon: Lynann Bologna, MD;  Location: [facility] ENDOSCOPY;  Service: Endoscopy;  Laterality: N/A;   INGUINAL HERNIA REPAIR   04/09/06   Bilateral   LAPAROSCOPIC APPENDECTOMY  03/1981   LEFT HEART CATH AND CORONARY ANGIOGRAPHY Left 12/11/2019   Procedure: LEFT HEART CATH AND CORONARY ANGIOGRAPHY;  Surgeon: Yvonne Kendall, MD;  Location: [facility] INVASIVE CV LAB;  Service: Cardiovascular;  Laterality: Left;   REMOVAL OF STONES  07/08/2017   Procedure: REMOVAL OF STONES;  Surgeon: Lynann Bologna, MD;  Location: [facility] ENDOSCOPY;  Service: Endoscopy;;   SPHINCTEROTOMY  07/08/2017   Procedure: Dennison Mascot;  Surgeon: Lynann Bologna, MD;  Location: [facility] ENDOSCOPY;  Service: Endoscopy;;   Patient Active Problem List   Diagnosis Date Noted   Sinusitis 09/20/2022   Intraparenchymal hemorrhage of brain (HCC) 07/31/2022   AKI (acute kidney injury) (HCC) 07/27/2022   Hypertensive emergency 07/27/2022   Nontraumatic subcortical hemorrhage of left cerebral hemisphere (HCC) 07/25/2022   Weakness 07/20/2022   Leg swelling 07/20/2022   Dizziness 11/03/2021   Abscess 08/07/2021   Actinic keratosis 08/07/2021   Melanocytic nevi of trunk 08/07/2021   Nevus of back 08/07/2021   Personal history of other malignant neoplasm of skin 08/07/2021   Medicare annual wellness visit, subsequent 11/09/2020   Coronary artery disease of native artery of native heart with stable angina pectoris (HCC) 12/17/2019   Mixed hyperlipidemia 12/17/2019   Accelerating angina (HCC) 12/11/2019   Shingles 03/01/2019   FH: prostate cancer 02/21/2017   Advance care planning 03/31/2014   PSA elevation 03/27/2013   Thrombocytopenia,  unspecified (HCC) 03/27/2013   Cough 03/12/2013   ED (erectile dysfunction) 12/28/2011   Neoplasm of uncertain behavior of skin 12/27/2010   Exertional chest pain 01/05/2010   Essential hypertension 04/08/2007   HYPERTRIGLYCERIDEMIA 01/08/2007   GILBERT'S SYNDROME 01/08/2007   ULCERATIVE COLITIS 01/08/2007    ONSET DATE: 07/25/2022  REFERRING DIAG: Intraparenchymal hemorrhage of brain (HCC)   THERAPY DIAG:  Muscle weakness  (generalized)  Other lack of coordination  Other abnormalities of gait and mobility  Unsteadiness on feet  Difficulty in walking, not elsewhere classified  Rationale for Evaluation and Treatment: Rehabilitation  SUBJECTIVE:                                                                                                                                                                                             SUBJECTIVE STATEMENT:    Patient reports feeling much better overall today. States he feels physically like he is 80+ percent back to his normal. States has been out on 1 call for work and did well but doesn't feel ready to drive yet.     Pt accompanied by: self (wife in waiting zone)   PERTINENT HISTORY:  Per acute care PT evaluation on 07/27/2022: "Prior to admission pt was independent, working in [employer], and driving. On this date, pt presents with expressive communication deficits, R inattention, R hemiparesis, and impaired balance & gait. Pt requires min assist to ambulate and decreased ability to correct gait pattern to reduce fall risk. Pt with R inattention, bumping doorway x 2 despite PT educating throughout session." Pt DC to inpatient rehab with the following measures as of discharge on 08/04/2022: AMB 400-558ft at [facility], FGA 21/30, intervention emphasis on dynamic balance, gait training, and Rt foot clearance.  PAIN:  Are you having pain? No  PRECAUTIONS: Fall  WEIGHT BEARING RESTRICTIONS: No  FALLS: Has patient fallen in last 6 months? No  PATIENT GOALS: to walk straight, work on memory  OBJECTIVE:   TODAY'S TREATMENT:  DATE: 10/04/2022  Physical therapy treatment session today consisted of completing assessment of goals and administration of testing as demonstrated and documented in flow sheet, treatment, and goals section of this note.  Addition treatments may be found below.  10 Meter Walk Test: Patient instructed to walk 10 meters (32.8 ft) as quickly and as safely as possible at their normal speed x2 and at a fast speed x2. Time measured from 2 meter mark to 8 meter mark to accommodate ramp-up and ramp-down.  Normal speed 1: 11.43 sec=  .88 m/s Normal speed 2:11.79 sec= .85 m/s Average Normal speed: 0.87 m/s Fast speed 1: 7.79 sec = 1.28 m/s Fast speed 2: 8.01 sec = 1.25 m/s Average Fast speed: 1.27 m/s Cut off scores: <0.4 m/s = household Ambulator, 0.4-0.8 m/s = limited community Ambulator, >0.8 m/s = community Ambulator, >1.2 m/s = crossing a street, <1.0 = increased fall risk MCID 0.05 m/s (small), 0.13 m/s (moderate), 0.06 m/s (significant)  (ANPTA Core Set of Outcome Measures for Adults with Neurologic Conditions, 2018)        THEREX:   Circuit: 2 rounds Step up onto 6 " block without UE support x 20 reps alt LE (Slow and steady) Sit to stand x 10 reps without UE Support Ambulation in clinic - 160 feet    NMR:  Dynamic walking with horizontal head turning- Left to right x 80 feet x 2 (min veer to right initially) improved on 2nd trial.  Dynamic tandem standing on airex beam hold 30 sec x 3 Dynamic marching on airex pad x 20 reps Dynamic step up/over 1/2 foam roll x 20 reps  CGA throughout session unless otherwise stated.    PATIENT EDUCATION: Education details: Exercise technique Person educated: Patient Education method: Medical illustrator Education comprehension: verbalized understanding  HOME EXERCISE PROGRAM: Access Code: [email or web address] URL: [email or web address] Date: 09/03/2022 Prepared by: Grier Rocher  Exercises - Standing Single Leg Stance with Counter Support  - 1 x daily - 7 x weekly - 3 sets - 4 reps - 15 hold - Romberg Stance with Eyes Closed  - 1 x daily - 7 x weekly - 3 sets - 3 reps - 15 hold - Standing Tandem Balance with Counter Support  - 1 x daily -  7 x weekly - 3 sets - 4 reps - 15 hold   GOALS: Goals reviewed with patient? No  SHORT TERM GOALS: Target date: 09/12/2022   Patient will be independent in home exercise program to improve strength/mobility for better functional independence with ADLs. Baseline: No HEP currently; 10/04/2022 - Patient reports not always compliant but no questions and independent with all program Goal status: MET   LONG TERM GOALS: Target date: 10/10/2022   1.  Patient will increase FOTO score to equal to or greater than  80   to demonstrate statistically significant improvement in mobility and quality of life.  Baseline: 73; 10/04/2022= 98 Goal status: MET   2.  Patient will increase FGA score by > 4 points to demonstrate decreased fall risk during functional activities. Baseline: 26; 10/04/2022= 30 Goal status: MET    3.   Patient will increase 10 meter walk test to >1.63m/s as to improve gait speed for better community ambulation and to reduce fall risk. Baseline: 12.37 sec, .81 m/s; 10/04/2022= 0.87 m/s normal and 1.28 m/s fast Goal status: Partially met  4.   Patient will increase six minute walk test distance to >1200 for progression to advanced community ambulator and improve  gait ability Baseline: 942 feet; 10/04/2022= 1200 feet  Goal status: MET  5.   Patient will successfully navigate a simulated community environment/obstacle course while successfully attending to R side obstacles without cueing from therapist. Baseline: assess 2nd session; 09/12/2022= Patient able to negotiate obstacle course and walking down hallway with 75% accuracy attending to right side - mild drift to right during hallway walk. 10/04/2022- Patient demonstrated 100% accuracy with negotiating objects on his right side during testing in hallway and clinic today.  Goal status: MET 6.   Patient will display an increase of LE functional strength of 4+/5 or better for various workplace tasks by carrying weighted objects greater  distances without rest breaks, no LOB or any level of assist form therapist. Baseline:  09/12/2022- 5/5 B Hip flex/knee ext strength Goal status: MET 7.   Patient will show increased safety with floor transfers by showing consistent and proper transfer mechanics at Modified independent assist or better provided by therapist. Baseline: assess 2nd session; 09/12/2022- Patient able to perform floor transfers and crawling independent - no difficulty with half kneeling/tall kneeling for pushing himself up to standing or maneuvering himself to floor.  Goal status: MET    ASSESSMENT:  CLINICAL IMPRESSION: Assessed all remaining goals and patient has demonstrated excellent progress overall- meeting all goals at this point. He is demonstrating independent mobility with much improved balance. He has no further skilled PT needs at this time and appropriate for discharge. Patient verbalized understanding and agreement with plan.     OBJECTIVE IMPAIRMENTS: Abnormal gait, decreased balance, decreased cognition, and decreased endurance.   ACTIVITY LIMITATIONS: carrying, lifting, squatting, and locomotion level  PARTICIPATION LIMITATIONS: driving, community activity, and occupation  PERSONAL FACTORS: Age, Past/current experiences, and Time since onset of injury/illness/exacerbation are also affecting patient's functional outcome.   REHAB POTENTIAL: Good  CLINICAL DECISION MAKING: Stable/uncomplicated  EVALUATION COMPLEXITY: Low  PLAN:  PT FREQUENCY: 1-2x/week  PT DURATION: 8 weeks  PLANNED INTERVENTIONS: Therapeutic exercises, Therapeutic activity, Neuromuscular re-education, Balance training, Gait training, Patient/Family education, Self Care, Joint mobilization, Stair training, Cryotherapy, Moist heat, and Re-evaluation  PLAN FOR NEXT SESSION:    Discharge from PT services today with goals met  Lenda Kelp PT  Physical Therapist - [facility]   3:44 PM 10/05/22

## 2022-10-04 NOTE — Progress Notes (Signed)
Cough.  Initial eval, then doxy rx after not better.    Overall has been ongoing x 3 weeks with chest congestion. Was given abx and finished that yesterday. Perles have been helping with the cough but not the congestion he has in his chest, less sputum than prior.  Still with discolored rhinorrhea.  No fevers.  Not SOB.  No facial pain, that is better now.    He had felt episodically dizzy, d/w pt.  This could be illness related and treating the underlying issue may help.    He has been off vascepa.  He is taking fish oil. Med list updated.  He has been taking it daily, d/w pt about trying to increase as tolerated.    Meds, vitals, and allergies reviewed.   ROS: Per HPI unless specifically indicated in ROS section   GEN: nad, alert and oriented HEENT: mucous membranes moist NECK: supple w/o LA CV: rrr.  PULM: ctab, no inc wob ABD: soft, +bs EXT: no edema SKIN: no acute rash TM wnl, sinuses not ttp.

## 2022-10-08 ENCOUNTER — Ambulatory Visit: Payer: PPO | Admitting: Speech Pathology

## 2022-10-08 ENCOUNTER — Ambulatory Visit: Payer: PPO | Admitting: Occupational Therapy

## 2022-10-08 ENCOUNTER — Ambulatory Visit: Payer: PPO

## 2022-10-08 DIAGNOSIS — I619 Nontraumatic intracerebral hemorrhage, unspecified: Secondary | ICD-10-CM

## 2022-10-08 DIAGNOSIS — R41841 Cognitive communication deficit: Secondary | ICD-10-CM

## 2022-10-08 DIAGNOSIS — R278 Other lack of coordination: Secondary | ICD-10-CM

## 2022-10-08 DIAGNOSIS — M6281 Muscle weakness (generalized): Secondary | ICD-10-CM | POA: Diagnosis not present

## 2022-10-08 NOTE — Therapy (Signed)
OUTPATIENT OCCUPATIONAL THERAPY NEURO TREATMENT NOTE  Patient Name: Billy Kim MRN: [id number] DOB:[birth year], 77 y.o., male Today's Date: 10/08/2022  PCP: Joaquim Nam, MD REFERRING PROVIDER: Charlton Amor, PA-C  END OF SESSION:  OT End of Session - 10/08/22 0851     Visit Number 7    Number of Visits 12    Date for OT Re-Evaluation 11/14/22    OT Start Time 0845    OT Stop Time 0930    OT Time Calculation (min) 45 min    Activity Tolerance Patient tolerated treatment well    Behavior During Therapy [facility] for tasks assessed/performed            Past Medical History:  Diagnosis Date   Coronary artery disease    a. 11/2019 Cath: Moderate proximal LAD disease (not hemodynamically significant) and CTO's of D1 and non-dominant RCA.   HLD (hyperlipidemia)    Hypertension    Intracerebral hemorrhage (HCC)    a. 07/2022 CT Head: acute intraparenchymal hemorrhage in the left thalamus without midline shift or mass effect.   Systolic murmur    a. 07/2022 Echo: EF 60-65%, no rwma, GrI DD, nl RV fxn, RVSP 15.13mmHg, no significant valvular disease.   Ulcerative colitis 06/1979   Remission for years   Past Surgical History:  Procedure Laterality Date   CARDIAC CATHETERIZATION  03/20/01   Cardiolite EF 55% 02/10/02   CHOLECYSTECTOMY N/A 07/09/2017   Procedure: LAPAROSCOPIC CHOLECYSTECTOMY;  Surgeon: Jimmye Norman, MD;  Location: [facility] OR;  Service: General;  Laterality: N/A;   COLONOSCOPY  multiple   ENDOSCOPIC RETROGRADE CHOLANGIOPANCREATOGRAPHY (ERCP) WITH PROPOFOL N/A 07/08/2017   Procedure: ENDOSCOPIC RETROGRADE CHOLANGIOPANCREATOGRAPHY (ERCP) WITH PROPOFOL;  Surgeon: Lynann Bologna, MD;  Location: [facility] ENDOSCOPY;  Service: Endoscopy;  Laterality: N/A;   INGUINAL HERNIA REPAIR  04/09/06   Bilateral   LAPAROSCOPIC APPENDECTOMY  03/1981   LEFT HEART CATH AND CORONARY ANGIOGRAPHY Left 12/11/2019   Procedure: LEFT HEART CATH AND CORONARY ANGIOGRAPHY;  Surgeon: Yvonne Kendall, MD;   Location: [facility] INVASIVE CV LAB;  Service: Cardiovascular;  Laterality: Left;   REMOVAL OF STONES  07/08/2017   Procedure: REMOVAL OF STONES;  Surgeon: Lynann Bologna, MD;  Location: [facility] ENDOSCOPY;  Service: Endoscopy;;   SPHINCTEROTOMY  07/08/2017   Procedure: Dennison Mascot;  Surgeon: Lynann Bologna, MD;  Location: [facility] ENDOSCOPY;  Service: Endoscopy;;   Patient Active Problem List   Diagnosis Date Noted   Sinusitis 09/20/2022   Intraparenchymal hemorrhage of brain (HCC) 07/31/2022   AKI (acute kidney injury) (HCC) 07/27/2022   Hypertensive emergency 07/27/2022   Nontraumatic subcortical hemorrhage of left cerebral hemisphere (HCC) 07/25/2022   Weakness 07/20/2022   Leg swelling 07/20/2022   Dizziness 11/03/2021   Abscess 08/07/2021   Actinic keratosis 08/07/2021   Melanocytic nevi of trunk 08/07/2021   Nevus of back 08/07/2021   Personal history of other malignant neoplasm of skin 08/07/2021   Medicare annual wellness visit, subsequent 11/09/2020   Coronary artery disease of native artery of native heart with stable angina pectoris (HCC) 12/17/2019   Mixed hyperlipidemia 12/17/2019   Accelerating angina (HCC) 12/11/2019   Shingles 03/01/2019   FH: prostate cancer 02/21/2017   Advance care planning 03/31/2014   PSA elevation 03/27/2013   Thrombocytopenia, unspecified (HCC) 03/27/2013   Cough 03/12/2013   ED (erectile dysfunction) 12/28/2011   Neoplasm of uncertain behavior of skin 12/27/2010   Exertional chest pain 01/05/2010   Essential hypertension 04/08/2007   HYPERTRIGLYCERIDEMIA 01/08/2007   GILBERT'S SYNDROME 01/08/2007  ULCERATIVE COLITIS 01/08/2007   ONSET DATE: 07/25/2022  REFERRING DIAG: Intraparenchymal hemorrhage of brain (HCC)  THERAPY DIAG:  Other lack of coordination  Rationale for Evaluation and Treatment: Rehabilitation  SUBJECTIVE:  SUBJECTIVE STATEMENT:  Pt reports that his dizziness is better today.  Pt accompanied by: self  PERTINENT HISTORY: Pt.  Is a 77 y.o. male who has been diagnosed with a Intraparenchymal hemorrhage of the brain (HCC). Other dx include AKI, hypertension, hyperlipidemia, and history of CAD and ulcerative colitis. Pt. Presents with expressive communication deficits, R inattention, and R hemiparesis and impaired balance.   PRECAUTIONS: No driving WEIGHT BEARING RESTRICTIONS: No  PAIN:  Are you having pain? No  FALLS: Has patient fallen in last 6 months? No  LIVING ENVIRONMENT: Lives with: lives with their spouse Lives in: 2 story home Stairs: Yes: Internal: 14 steps; and External: 4 steps; can reach both Has following equipment at home: None  PLOF: Independent  PATIENT GOALS: Work on improving memory, and balance on the R side  OBJECTIVE:   HAND DOMINANCE: Right  ADLs:  Eating: Independent however holds utensils differently with the right hand, grabs handle with entire hand Grooming: Independent UB Dressing: Independent LB Dressing: Takes longer to put on pants due to balance, independent with tying shoes and putting socks on when sitting down Toileting: Independent Bathing: Tub shower, standing, holds onto wall to keep balance Tub Shower transfers: Independent, hold onto wall Equipment: none  IADLs: Shopping: Does with spouse, pushes cart to help balance,  Light housekeeping: Wife does most of it now, increased time due to being cautious to ensure he does not lose his balance Meal Prep: Wife prepares all meals, able to go to fridge to get out a drink Community mobility: Uses grocery cart to balance Medication management: Wife sets them out each day for him, pt. Takes them independently Financial management: Spouse handles finance Handwriting: 100% legible  MOBILITY STATUS: Independent however often has to hold onto things to ensure balance  POSTURE COMMENTS:  No Significant postural limitations Sitting balance:  WFL  ACTIVITY TOLERANCE: Activity tolerance: WFL  FUNCTIONAL OUTCOME  MEASURES: FOTO: Eval: 98 TR:98  (FOTO goal is not indicated at this time)  UPPER EXTREMITY ROM:    Active ROM Right WFL Left WFL  Shoulder flexion    Shoulder abduction    Shoulder adduction    Shoulder extension    Shoulder internal rotation    Shoulder external rotation    Elbow flexion    Elbow extension    Wrist flexion    Wrist extension    Wrist ulnar deviation    Wrist radial deviation    Wrist pronation    Wrist supination    (Blank rows = not tested)  UPPER EXTREMITY MMT:     MMT Right eval Left eval  Shoulder flexion 5/5 5/5  Shoulder abduction 5/5 5/5  Shoulder adduction    Shoulder extension    Shoulder internal rotation    Shoulder external rotation    Middle trapezius    Lower trapezius    Elbow flexion 5/5 5/5  Elbow extension 5/5 5/5  Wrist flexion 5/5 5/5  Wrist extension    Wrist ulnar deviation    Wrist radial deviation    Wrist pronation    Wrist supination    (Blank rows = not tested)  HAND FUNCTION: Grip strength: Right: 87 lbs; Left: 102 lbs, Lateral pinch: Right: 21 lbs, Left: 21 lbs, and 3 point pinch: Right: 17 lbs, Left: 18  lbs 09/10/22: Grip strength: Right: 96 lbs  COORDINATION: 9 Hole Peg test: Right: 29 sec; Left: 25 sec  SENSATION: Light touch: WFL Stereognosis: WFL  EDEMA: None  MUSCLE TONE: None  COGNITION: Overall cognitive status: Impaired memory  VISION: Subjective report: Pt. Reports he has not noticed any vision changes. Baseline vision: Wears glasses all the time Visual history:  None  VISION ASSESSMENT: To be further assessed in functional context  PERCEPTION: TBD  PRAXIS: WFL  TODAY'S TREATMENT:                                                                                                                              DATE: 10/08/2022  Neuromuscular re-education:  Pt. worked on [facility] skills using the [employer] Task. Pt. worked on sustaining grasp on the resistive tweezers while  grasping this sticks, and moving them from a horizontal position to a vertical position to prepare for placing them into the pegboard. Pt. required verbal cues, and cues for visual demonstration for wrist position, and hand pattern when placing them into the pegboard. Pt. Performed right hand [facility] tasks using the Grooved pegboard. Pt. worked on grasping the grooved pegs from a horizontal position, storing them in the palm of his hand, and translatory skills moving the pegs to a vertical position in the hand to prepare for placing them in the grooved slot.         PATIENT EDUCATION: Education details: use of a pill organizer to reduce forgetfulness with taking meds/vitamins Person educated: Patient Education method: Explanation, Demonstration, and Verbal cues Education comprehension: verbalized understanding  HOME EXERCISE PROGRAM: Theraputty exercises for R hand strengthening   GOALS: Goals reviewed with patient? Yes  SHORT TERM GOALS: Target date: 10/03/2022    Pt. Will be independent with HEP for increasing R hand strength and coordination. Baseline: Currently no HEP Goal status: INITIAL  LONG TERM GOALS: Target date: 11/14/22  Pt. Will increase R grip strength by 5# to be able to securely hold and carry objects for ADL/IADL tasks.  Baseline: Grip strength: Right: 87 lbs; Left: 102 lbs Goal status: INITIAL  2.  Pt. Will improve R [facility]MC skills by 2 seconds of speed to independently manipulate small objects during ADL/IADL tasks.  Baseline: Eval: 9 hole peg test: L: 25 seconds, R: 29 seconds Goal status: INITIAL  3.  Pt. Will demonstrate simulated pill box setup with supervision.  Baseline: Eval: Pts. Wife currently sets out medicine for Pt. To take, Pt. Has pill organizer but does not use it yet. Goal status: INITIAL  4.  Pt. Will independently demonstrate holding utensil with proper form to increase engagement with eating tasks.   Baseline: Pt. Currently uses full grasp to hold  utensil while eating rather than  Goal status: INITIAL   ASSESSMENT:  CLINICAL IMPRESSION:  Pt. reports dizziness is much better this week. Pt. Reports that buttoning with his right hand is improving. Pt. reports that his brother  noticed he had a more natural arm swing walking down the aisle in church on Sunday. Pt. Is making process with right hand [facility] skills including grasping, and manipulating small objects. Pt. Was able to grasp, and manipulate the tweezers to change the position of the 1" thin pegs. Pt. requires increased time with translatory movements of the hand. Pt. continues to benefit from skilled OT services to improve RUE strength, to improve R hand coordination to efficiently manipulate objects in order to increase  engagement in and independence in ADL/IADL tasks, and to practice simulated return to work tasks (note taking for new client appointments and use of gps practiced this date) as pt continues to be eager to return to work.    PERFORMANCE DEFICITS: in functional skills including ADLs, IADLs, coordination, strength, and UE functional use, cognitive skills including memory, and psychosocial skills including coping strategies, environmental adaptation, and routines and behaviors.   IMPAIRMENTS: are limiting patient from ADLs, IADLs, and work.   CO-MORBIDITIES: has no other co-morbidities that affects occupational performance. Patient will benefit from skilled OT to address above impairments and improve overall function.  MODIFICATION OR ASSISTANCE TO COMPLETE EVALUATION: No modification of tasks or assist necessary to complete an evaluation.  OT OCCUPATIONAL PROFILE AND HISTORY: Detailed assessment: Review of records and additional review of physical, cognitive, psychosocial history related to current functional performance.  CLINICAL DECISION MAKING: Moderate - several treatment options, min-mod task modification necessary  REHAB POTENTIAL: Good  EVALUATION COMPLEXITY:  Moderate    PLAN:  OT FREQUENCY: 1x/week  OT DURATION: 12 weeks  PLANNED INTERVENTIONS: self care/ADL training, therapeutic exercise, therapeutic activity, neuromuscular re-education, and patient/family education  RECOMMENDED OTHER SERVICES: ST, PT  CONSULTED AND AGREED WITH PLAN OF CARE: Patient  PLAN FOR NEXT SESSION: Initiate Treatment   Olegario Messier, MS, OTR/L  10/08/2022, 8:55 AM

## 2022-10-08 NOTE — Therapy (Signed)
OUTPATIENT SPEECH LANGUAGE PATHOLOGY  TREATMENT NOTE   Patient Name: Billy Kim MRN: [id number] DOB:1945-07-28, 77 y.o., male Today's Date: 10/08/2022  PCP: Crawford Givens, MD REFERRING PROVIDER: Claudette Laws, MD    End of Session - 10/08/22 0926     Visit Number 11    Number of Visits 25    Date for SLP Re-Evaluation 11/07/22    Authorization Type [employer]    Progress Note Due on Visit 10    SLP Start Time 0930    SLP Stop Time  1015    SLP Time Calculation (min) 45 min    Activity Tolerance Patient tolerated treatment well             No past medical history on file. The histories are not reviewed yet. Please review them in the "History" navigator section and refresh this SmartLink. Patient Active Problem List   Diagnosis Date Noted   Sinusitis 09/20/2022   Intraparenchymal hemorrhage of brain (HCC) 07/31/2022   AKI (acute kidney injury) (HCC) 07/27/2022   Hypertensive emergency 07/27/2022   Nontraumatic subcortical hemorrhage of left cerebral hemisphere (HCC) 07/25/2022   Weakness 07/20/2022   Leg swelling 07/20/2022   Dizziness 11/03/2021   Abscess 08/07/2021   Actinic keratosis 08/07/2021   Melanocytic nevi of trunk 08/07/2021   Nevus of back 08/07/2021   Personal history of other malignant neoplasm of skin 08/07/2021   Medicare annual wellness visit, subsequent 11/09/2020   Coronary artery disease of native artery of native heart with stable angina pectoris (HCC) 12/17/2019   Mixed hyperlipidemia 12/17/2019   Accelerating angina (HCC) 12/11/2019   Shingles 03/01/2019   FH: prostate cancer 02/21/2017   Advance care planning 03/31/2014   PSA elevation 03/27/2013   Thrombocytopenia, unspecified (HCC) 03/27/2013   Cough 03/12/2013   ED (erectile dysfunction) 12/28/2011   Neoplasm of uncertain behavior of skin 12/27/2010   Exertional chest pain 01/05/2010   Essential hypertension 04/08/2007   HYPERTRIGLYCERIDEMIA 01/08/2007   GILBERT'S  SYNDROME 01/08/2007   ULCERATIVE COLITIS 01/08/2007    ONSET DATE: 07/25/2022   REFERRING DIAG: I61.9 (ICD-10-CM) - Intraparenchymal hemorrhage of brain (HCC)   THERAPY DIAG:  Cognitive communication deficit  Intraparenchymal hemorrhage of brain (HCC)  Rationale for Evaluation and Treatment Rehabilitation  SUBJECTIVE:   PERTINENT HISTORY and DIAGNOSTIC FINDINGS: Pt is a right handed 77 year old male with past medical history of HLD, HTN, CAD with recent intra parenchymal hemorrhage on 07/25/2022. MRI (07/26/2022) revealed "known acute hemorrhage in the left thalamus measuring up to 2 cm and chronic small vessel ischemia with chronic microhemorrhages."   PAIN:  Are you having pain? No   FALLS: Has patient fallen in last 6 months?  No  LIVING ENVIRONMENT: Lives with: lives with their spouse Lives in: House/apartment  PLOF:  Level of assistance: Independent with ADLs, Independent with IADLs Employment: Full-time employment   PATIENT GOALS   to get better  SUBJECTIVE STATEMENT: Reports that he is feeling better, went to church Pt accompanied by: self  OBJECTIVE:   TODAY'S TREATMENT:  Skilled ST session focused on pt's cognitive communication goals. SLP facilitated the session by providing the following interventions:  TRAIL MAKING TEST (TMT) Parts A & B Both parts of the Trail Making Test consist of 25 circles distributed over a sheet of paper. In Part A, the circles are numbered 1 - 25, and the patient should draw lines to connect the numbers in ascending order. In Part B, the circles include both numbers (1 -  13) and letters (A - L); as in Part A, the patient draws lines to connect the circles in an ascending pattern, but with the added task of alternating between the numbers and letters (i.e., 1-A-2-B-3-C, etc.).   Results for both TMT A and B are reported as the number of seconds required to complete the task; therefore, higher scores reveal greater  impairment.  Results:  Trail A - WNL - 68 seconds (n=29 seconds; Deficient > 78 seconds) Trail B - WNL - 208 seconds (n= 75 seconds; Deficient > 273 seconds)  "TMTs have been shown to be significantly correlated with impaired driving on road tests by older drivers." "TMT-A provided the best utility for determining a range of scores (68-90 sec) for which additional road testing would be indicated in general practice settings."(Papandonatos GD, Delphina Cahill, [address] Barco PP, Carr DB. Clinical Utility of the Trail-Making Test as a Predictor of Driving Performance in Older Adults. J Am Geriatr Soc. 2015 Nov;63(11):2358-64. doi: 10.1111/jgs.13776. Epub 2015 Oct 27. PMID: 16109604; PMCID: [id number].)  SLP further facilitated session by providing maximal assistance for pt to achieve 100% on Level 2 of Read a Map contained within Constant Therapy Clinician App. In addition to maximal assistance, pt required more than a reasonable amount of time to [place] the cues provided.     PATIENT EDUCATION: Education details: see above Person educated: Patient and Spouse Education method: Explanation Education comprehension: needs further education  HOME EXERCISE PROGRAM:        Read out loud  Solve complex math problems  GOALS:  Goals reviewed with patient? Yes  SHORT TERM GOALS: Target date: 10 sessions  Updated 10/04/2022 With Min A, patient will describe visual scenes using 3 or more sentences at 95% accuracy. Baseline: Goal status: INITIAL: MET - upgraded to With supervision A, patient will describe visual scenes using 3 or more sentences at 95% accuracy.   2.  Pt will report improved cognitive communication via PROM by 5 points at last ST session    Baseline:  Goal status: INITIAL; progress made  3.   With Min A, patient will recall functional verbal information 80% accuracy with use of compensations/strategies (repeats, rephrasing, notetaking, etc).   Baseline:  Goal status: INITIAL; MET;  upgraded to  With supervision A, patient will recall functional verbal information 80% accuracy with use of compensations/strategies (repeats, rephrasing, notetaking, etc).   4.  With Min A, patient will generate sentences with 3 or more words in response to a situation at 95% accuracy in order to increase ability to communicate basic wants and needs.    Baseline:  Goal status: INITIAL: MET upgraded to With supervision A, patient will generate sentences with 3 or more words in response to a situation at 95% accuracy in order to increase ability to communicate basic wants and needs.   LONG TERM GOALS: Target date: 11/07/2022  With Supervision A, patient will participate in complex conversation at 90% accuracy to increase ability to communicate complex thoughts, feelings, and needs.  Baseline:  Goal status: INITIAL: ongoing  2.   With Supervision A, patient will demonstrate strategies for processing/recall (eg notetaking, paraphrasing) successfully by recalling >90% of details from 15 minutes verbal information.   Baseline:  Goal status: INITIAL: ongoing  ASSESSMENT:  CLINICAL IMPRESSION: Patient is a 77 y.o. right handed male who was seen today for cognitive communication treatment session d/t recent left thalamic hemorrhage. While pt struggled with completing today's assignment, he states that he performance was not far  from his baseline prior to most recent CVA.     OBJECTIVE IMPAIRMENTS include memory, executive functioning, expressive language, and aphasia. These impairments are limiting patient from return to work, managing medications, managing appointments, managing finances, household responsibilities, ADLs/IADLs, and effectively communicating at home and in community. Factors affecting potential to achieve goals and functional outcome are  N/A .Marland Kitchen Patient will benefit from skilled SLP services to address above impairments and improve overall function.  REHAB POTENTIAL:  Excellent  PLAN: SLP FREQUENCY: 1-2x/week  SLP DURATION: 12 weeks  PLANNED INTERVENTIONS: Language facilitation, Cueing hierachy, Cognitive reorganization, Internal/external aids, Functional tasks, SLP instruction and feedback, Compensatory strategies, and Patient/family education   Izaya Netherton B. Dreama Saa, M.S., CCC-SLP, Tree surgeon Certified Brain Injury Specialist [facility]  [facility] Office [phone] Ascom [phone] Fax [phone]

## 2022-10-10 ENCOUNTER — Ambulatory Visit: Payer: PPO | Admitting: Speech Pathology

## 2022-10-10 ENCOUNTER — Encounter: Payer: PPO | Admitting: Occupational Therapy

## 2022-10-10 ENCOUNTER — Ambulatory Visit: Payer: PPO | Admitting: Physical Therapy

## 2022-10-10 DIAGNOSIS — M6281 Muscle weakness (generalized): Secondary | ICD-10-CM | POA: Diagnosis not present

## 2022-10-10 DIAGNOSIS — I619 Nontraumatic intracerebral hemorrhage, unspecified: Secondary | ICD-10-CM

## 2022-10-10 DIAGNOSIS — R41841 Cognitive communication deficit: Secondary | ICD-10-CM

## 2022-10-10 NOTE — Therapy (Signed)
OUTPATIENT SPEECH LANGUAGE PATHOLOGY  TREATMENT NOTE   Patient Name: Billy Kim MRN: [id number] DOB:1945/07/17, 77 y.o., male Today's Date: 10/10/2022  PCP: Crawford Givens, MD REFERRING PROVIDER: Claudette Laws, MD    End of Session - 10/10/22 1155     Visit Number 12    Number of Visits 25    Date for SLP Re-Evaluation 11/07/22    Authorization Type [employer]    Progress Note Due on Visit 20    SLP Start Time 1145    SLP Stop Time  1230    SLP Time Calculation (min) 45 min    Activity Tolerance Patient tolerated treatment well             No past medical history on file. The histories are not reviewed yet. Please review them in the "History" navigator section and refresh this SmartLink. Patient Active Problem List   Diagnosis Date Noted   Sinusitis 09/20/2022   Intraparenchymal hemorrhage of brain (HCC) 07/31/2022   AKI (acute kidney injury) (HCC) 07/27/2022   Hypertensive emergency 07/27/2022   Nontraumatic subcortical hemorrhage of left cerebral hemisphere (HCC) 07/25/2022   Weakness 07/20/2022   Leg swelling 07/20/2022   Dizziness 11/03/2021   Abscess 08/07/2021   Actinic keratosis 08/07/2021   Melanocytic nevi of trunk 08/07/2021   Nevus of back 08/07/2021   Personal history of other malignant neoplasm of skin 08/07/2021   Medicare annual wellness visit, subsequent 11/09/2020   Coronary artery disease of native artery of native heart with stable angina pectoris (HCC) 12/17/2019   Mixed hyperlipidemia 12/17/2019   Accelerating angina (HCC) 12/11/2019   Shingles 03/01/2019   FH: prostate cancer 02/21/2017   Advance care planning 03/31/2014   PSA elevation 03/27/2013   Thrombocytopenia, unspecified (HCC) 03/27/2013   Cough 03/12/2013   ED (erectile dysfunction) 12/28/2011   Neoplasm of uncertain behavior of skin 12/27/2010   Exertional chest pain 01/05/2010   Essential hypertension 04/08/2007   HYPERTRIGLYCERIDEMIA 01/08/2007   GILBERT'S  SYNDROME 01/08/2007   ULCERATIVE COLITIS 01/08/2007    ONSET DATE: 07/25/2022   REFERRING DIAG: I61.9 (ICD-10-CM) - Intraparenchymal hemorrhage of brain (HCC)   THERAPY DIAG:  Cognitive communication deficit  Intraparenchymal hemorrhage of brain (HCC)  Rationale for Evaluation and Treatment Rehabilitation  SUBJECTIVE:   PERTINENT HISTORY and DIAGNOSTIC FINDINGS: Pt is a right handed 77 year old male with past medical history of HLD, HTN, CAD with recent intra parenchymal hemorrhage on 07/25/2022. MRI (07/26/2022) revealed "known acute hemorrhage in the left thalamus measuring up to 2 cm and chronic small vessel ischemia with chronic microhemorrhages."   PAIN:  Are you having pain? No   FALLS: Has patient fallen in last 6 months?  No  LIVING ENVIRONMENT: Lives with: lives with their spouse Lives in: House/apartment  PLOF:  Level of assistance: Independent with ADLs, Independent with IADLs Employment: Full-time employment   PATIENT GOALS   to get better  SUBJECTIVE STATEMENT: He reports that he would like to go back to work on the days that he doesn't have therapy Pt accompanied by: self  OBJECTIVE:   TODAY'S TREATMENT:  Skilled ST session focused on pt's cognitive communication goals. SLP facilitated the session by providing the following interventions:  SLP worked with pt to develop strategy for return to work. He comments that he has a handy man coming on Friday to help with some things that he is able to do around the house but doesn't "want to do." He refers to "being lazy." He also  reports that he hasn't driven despite being given permission by Dr Pearlean Brownie to drive with his wife. Uncertain as to what deficits/desires are baseline vs acute related to stroke as pt states that he struggled with memory and problem solving prior to stroke. Plan developed for pt to increase his physical and cognitive work around house and work as well as driving time per MD recommendations. Pt  with delayed slowed responses to SLP questions/recommendations.   PATIENT EDUCATION: Education details: see above Person educated: Patient and Spouse Education method: Explanation Education comprehension: needs further education  HOME EXERCISE PROGRAM:        Read out loud  Solve complex math problems  GOALS:  Goals reviewed with patient? Yes  SHORT TERM GOALS: Target date: 10 sessions  Updated 10/04/2022 With Min A, patient will describe visual scenes using 3 or more sentences at 95% accuracy. Baseline: Goal status: INITIAL: MET - upgraded to With supervision A, patient will describe visual scenes using 3 or more sentences at 95% accuracy.   2.  Pt will report improved cognitive communication via PROM by 5 points at last ST session    Baseline:  Goal status: INITIAL; progress made  3.   With Min A, patient will recall functional verbal information 80% accuracy with use of compensations/strategies (repeats, rephrasing, notetaking, etc).   Baseline:  Goal status: INITIAL; MET; upgraded to  With supervision A, patient will recall functional verbal information 80% accuracy with use of compensations/strategies (repeats, rephrasing, notetaking, etc).   4.  With Min A, patient will generate sentences with 3 or more words in response to a situation at 95% accuracy in order to increase ability to communicate basic wants and needs.    Baseline:  Goal status: INITIAL: MET upgraded to With supervision A, patient will generate sentences with 3 or more words in response to a situation at 95% accuracy in order to increase ability to communicate basic wants and needs.   LONG TERM GOALS: Target date: 11/07/2022  With Supervision A, patient will participate in complex conversation at 90% accuracy to increase ability to communicate complex thoughts, feelings, and needs.  Baseline:  Goal status: INITIAL: ongoing  2.   With Supervision A, patient will demonstrate strategies for processing/recall  (eg notetaking, paraphrasing) successfully by recalling >90% of details from 15 minutes verbal information.   Baseline:  Goal status: INITIAL: ongoing  ASSESSMENT:  CLINICAL IMPRESSION: Patient is a 76 y.o. right handed male who was seen today for cognitive communication treatment session d/t recent left thalamic hemorrhage. While pt struggled with completing today's assignment, he states that he performance was not far from his baseline prior to most recent CVA. Pt receptive to creating potential plan to part time work. Will continue to monitor and adjust as indicated over next 1-2 sessions before discussing discharge from services.     OBJECTIVE IMPAIRMENTS include memory, executive functioning, expressive language, and aphasia. These impairments are limiting patient from return to work, managing medications, managing appointments, managing finances, household responsibilities, ADLs/IADLs, and effectively communicating at home and in community. Factors affecting potential to achieve goals and functional outcome are  N/A .Marland Kitchen Patient will benefit from skilled SLP services to address above impairments and improve overall function.  REHAB POTENTIAL: Excellent  PLAN: SLP FREQUENCY: 1-2x/week  SLP DURATION: 12 weeks  PLANNED INTERVENTIONS: Language facilitation, Cueing hierachy, Cognitive reorganization, Internal/external aids, Functional tasks, SLP instruction and feedback, Compensatory strategies, and Patient/family education   Raun Routh B. Dreama Saa, M.S., CCC-SLP, CBIS Speech-Language Pathologist Certified Brain  Injury Specialist [facility]  [facility] Rehabilitation Services Office [phone] Ascom [phone] Fax [phone]

## 2022-10-11 ENCOUNTER — Encounter: Payer: PPO | Attending: Physical Medicine & Rehabilitation | Admitting: Physical Medicine & Rehabilitation

## 2022-10-11 ENCOUNTER — Encounter: Payer: Self-pay | Admitting: Physical Medicine & Rehabilitation

## 2022-10-11 VITALS — BP 145/86 | HR 67 | Ht 67.0 in | Wt 180.0 lb

## 2022-10-11 DIAGNOSIS — I63512 Cerebral infarction due to unspecified occlusion or stenosis of left middle cerebral artery: Secondary | ICD-10-CM | POA: Insufficient documentation

## 2022-10-11 NOTE — Progress Notes (Signed)
Subjective:    Patient ID: Billy Kim, male    DOB: [birth year], 77 y.o.   MRN: [id number] 77 y.o. right-handed male with history of CAD maintained on low-dose aspirin hyperlipidemia hypertension ulcerative colitis that is in remission.  Per chart review lives with spouse independent prior to admission working full-time.  Presented to [facility] 07/25/2022 with word finding difficulty and gait instability.  Admission chemistries unremarkable except BUN 26 creatinine 1.42 total bilirubin 2.4.  Cranial CT scan sho wed acute intraparenchymal hemorrhage in the left thalamus measuring 1.8 x 1.4 x 1.2 cm.  No midline shift or mass effect.  CT angiogram of the head showed no aneurysm or vascular malformation.  MRI follow-up no mass or unexpected findings.  Initially on Cleviprex for blood pressure control.  Neurosurgery as well as neurology consulted for follow-up with conservative care.  Echocardiogram ejection fraction 60 to 65% no wall motion abnormalities grade 1 diastolic dysfunction.  Tolerating a regular consistency diet.  Therapy evaluations completed and patient was admitted for a comprehensive rehab program due to decreased functional mobility.  Admit date: 07/31/2022 Discharge date: 08/07/2022 HPI No complaints of neurogenic pain. Seen by Dr. Pearlean Brownie from neurology.  Advised no driving.  Patient also asking about return to work we discussed that he does some heavy lifting and would not advise this.  May consider some work simulation with physical therapy or occupational therapy in 1 to 2 months but not to exceed 20 pounds lifting maximum Has done some limited driving , he is not very confident with this yet.  Walks 1 mile per day , wife notes at the end of walk pt gets a little tired   No falls  Pain Inventory Average Pain 0 Pain Right Now 0 My pain is  no pain  LOCATION OF PAIN  no pain  BOWEL Number of stools per week: 3 Oral laxative use No  Type of laxative no Enema or suppository use No   History of colostomy No  Incontinent No   BLADDER Normal In and out cath, frequency na Able to self cath  na Bladder incontinence No  Frequent urination No  Leakage with coughing No  Difficulty starting stream No  Incomplete bladder emptying No    Mobility walk without assistance ability to climb steps?  yes do you drive?  yes Do you have any goals in this area?  yes  Function retired Do you have any goals in this area?  no  Neuro/Psych weakness tingling dizziness  Prior Studies na  Physicians involved in your care Follow up   Family History  Problem Relation Age of Onset   Cancer Mother        ? GYN, died after hysterectomy   Stroke Father    CAD Father    Hypertension Sister    Prostate cancer Brother    CAD Brother 65       CABG   Bladder Cancer Brother 27       Bladder   Hypertension Brother    Hypertension Brother    Hypertension Brother    Hypertension Brother    Hypertension Brother    Bone cancer Paternal Grandfather    Hypertension Son    Prostate cancer Nephew    Bladder Cancer Nephew    Colon cancer Neg Hx    Esophageal cancer Neg Hx    Rectal cancer Neg Hx    Stomach cancer Neg Hx    Social History   Socioeconomic History  Marital status: Married    Spouse name: Not on file   Number of children: 1   Years of education: Not on file   Highest education level: Not on file  Occupational History   Occupation: OWNER    Employer: SELF    Comment: [employer]  Tobacco Use   Smoking status: Never   Smokeless tobacco: Never  Vaping Use   Vaping status: Never Used  Substance and Sexual Activity   Alcohol use: No   Drug use: No   Sexual activity: Never  Other Topics Concern   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   Social Determinants of Health   Financial Resource Strain: Low Risk  (08/04/2020)   Overall  Financial Resource Strain (CARDIA)    Difficulty of Paying Living Expenses: Not hard at all  Food Insecurity: No Food Insecurity (08/04/2020)   Hunger Vital Sign    Worried About Running Out of Food in the Last Year: Never true    Ran Out of Food in the Last Year: Never true  Transportation Needs: No Transportation Needs (08/04/2020)   PRAPARE - Administrator, Civil Service (Medical): No    Lack of Transportation (Non-Medical): No  Physical Activity: Inactive (08/04/2020)   Exercise Vital Sign    Days of Exercise per Week: 0 days    Minutes of Exercise per Session: 0 min  Stress: No Stress Concern Present (08/04/2020)   [employer] of Occupational Health - Occupational Stress Questionnaire    Feeling of Stress : Not at all  Social Connections: Not on file   Past Surgical History:  Procedure Laterality Date   CARDIAC CATHETERIZATION  03/20/01   Cardiolite EF 55% 02/10/02   CHOLECYSTECTOMY N/A 07/09/2017   Procedure: LAPAROSCOPIC CHOLECYSTECTOMY;  Surgeon: Jimmye Norman, MD;  Location: [facility] OR;  Service: General;  Laterality: N/A;   COLONOSCOPY  multiple   ENDOSCOPIC RETROGRADE CHOLANGIOPANCREATOGRAPHY (ERCP) WITH PROPOFOL N/A 07/08/2017   Procedure: ENDOSCOPIC RETROGRADE CHOLANGIOPANCREATOGRAPHY (ERCP) WITH PROPOFOL;  Surgeon: Lynann Bologna, MD;  Location: [facility] ENDOSCOPY;  Service: Endoscopy;  Laterality: N/A;   INGUINAL HERNIA REPAIR  04/09/06   Bilateral   LAPAROSCOPIC APPENDECTOMY  03/1981   LEFT HEART CATH AND CORONARY ANGIOGRAPHY Left 12/11/2019   Procedure: LEFT HEART CATH AND CORONARY ANGIOGRAPHY;  Surgeon: Yvonne Kendall, MD;  Location: [facility] INVASIVE CV LAB;  Service: Cardiovascular;  Laterality: Left;   REMOVAL OF STONES  07/08/2017   Procedure: REMOVAL OF STONES;  Surgeon: Lynann Bologna, MD;  Location: [facility] ENDOSCOPY;  Service: Endoscopy;;   SPHINCTEROTOMY  07/08/2017   Procedure: Dennison Mascot;  Surgeon: Lynann Bologna, MD;  Location: [facility] ENDOSCOPY;  Service:  Endoscopy;;   Past Medical History:  Diagnosis Date   Coronary artery disease    a. 11/2019 Cath: Moderate proximal LAD disease (not hemodynamically significant) and CTO's of D1 and non-dominant RCA.   HLD (hyperlipidemia)    Hypertension    Intracerebral hemorrhage (HCC)    a. 07/2022 CT Head: acute intraparenchymal hemorrhage in the left thalamus without midline shift or mass effect.   Systolic murmur    a. 07/2022 Echo: EF 60-65%, no rwma, GrI DD, nl RV fxn, RVSP 15.55mmHg, no significant valvular disease.   Ulcerative colitis 06/1979   Remission for years   BP (!) 145/86   Pulse 67   Ht 5\' 7"  (1.702 m)   Wt 180 lb (  81.6 kg)   SpO2 94%   BMI 28.19 kg/m   Opioid Risk Score:   Fall Risk Score:  `1  Depression screen PHQ 2/9     10/11/2022    2:16 PM 10/04/2022   10:57 AM 09/26/2022   12:00 PM 08/21/2022    3:25 PM 08/21/2022    8:41 AM 08/08/2021   10:03 AM 08/04/2020    8:30 AM  Depression screen PHQ 2/9  Decreased Interest 0 0 0 1 0 0 0  Down, Depressed, Hopeless 0 0 0 0 0 0 0  PHQ - 2 Score 0 0 0 1 0 0 0  Altered sleeping  0 0 0 0 0 0  Tired, decreased energy  0 0 2 1 1  0  Change in appetite  0 0 0 0 0 0  Feeling bad or failure about yourself   0 0 1 1 0 0  Trouble concentrating  0 0 2 2 1  0  Moving slowly or fidgety/restless  0 0 3 2 0 0  Suicidal thoughts  0 0 0 0 0 0  PHQ-9 Score  0 0 9 6 2  0  Difficult doing work/chores  Not difficult at all Not difficult at all  Not difficult at all Not difficult at all Not difficult at all      Review of Systems  All other systems reviewed and are negative.      Objective:   Physical Exam  General No acute distress Mood and affect appropriate Speech mild anomia good comprehension. Motor strength is 5 - in the right deltoid bicep tricep grip 5/5 left deltoid bicep tricep grip 5/5 bilateral hip flexor knee extensor ankle dorsiflexor Ambulates without assistive device no toe drag and instability is able to toe walk as well  as heel walk but is unable to perform tandem gait more than a couple steps. Sensation equal bilateral upper limbs Visual fields are intact confrontation testing Extraocular muscles intact      Assessment & Plan:  1.  History of left thalamic hemorrhage overall making excellent recovery.  We discussed return to work issues.  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  We discussed that we we will need to address this at next visit and he will need to complete some further therapy.

## 2022-10-15 ENCOUNTER — Ambulatory Visit: Payer: PPO | Admitting: Occupational Therapy

## 2022-10-15 ENCOUNTER — Ambulatory Visit: Payer: PPO | Admitting: Speech Pathology

## 2022-10-15 DIAGNOSIS — M6281 Muscle weakness (generalized): Secondary | ICD-10-CM

## 2022-10-15 DIAGNOSIS — R278 Other lack of coordination: Secondary | ICD-10-CM

## 2022-10-15 DIAGNOSIS — I619 Nontraumatic intracerebral hemorrhage, unspecified: Secondary | ICD-10-CM

## 2022-10-15 DIAGNOSIS — R41841 Cognitive communication deficit: Secondary | ICD-10-CM

## 2022-10-15 NOTE — Therapy (Signed)
OUTPATIENT SPEECH LANGUAGE PATHOLOGY  TREATMENT NOTE   Patient Name: Billy Kim MRN: [id number] DOB:[birth year], 77 y.o., male Today's Date: 10/15/2022  PCP: Crawford Givens, MD REFERRING PROVIDER: Claudette Laws, MD    End of Session - 10/15/22 0933     Visit Number 13    Number of Visits 25    Date for SLP Re-Evaluation 11/07/22    Authorization Type [employer]    Progress Note Due on Visit 20    SLP Start Time 0930    SLP Stop Time  1015    SLP Time Calculation (min) 45 min    Activity Tolerance Patient tolerated treatment well             No past medical history on file. The histories are not reviewed yet. Please review them in the "History" navigator section and refresh this SmartLink. Patient Active Problem List   Diagnosis Date Noted   Sinusitis 09/20/2022   Intraparenchymal hemorrhage of brain (HCC) 07/31/2022   AKI (acute kidney injury) (HCC) 07/27/2022   Hypertensive emergency 07/27/2022   Nontraumatic subcortical hemorrhage of left cerebral hemisphere (HCC) 07/25/2022   Weakness 07/20/2022   Leg swelling 07/20/2022   Dizziness 11/03/2021   Abscess 08/07/2021   Actinic keratosis 08/07/2021   Melanocytic nevi of trunk 08/07/2021   Nevus of back 08/07/2021   Personal history of other malignant neoplasm of skin 08/07/2021   Medicare annual wellness visit, subsequent 11/09/2020   Coronary artery disease of native artery of native heart with stable angina pectoris (HCC) 12/17/2019   Mixed hyperlipidemia 12/17/2019   Accelerating angina (HCC) 12/11/2019   Shingles 03/01/2019   FH: prostate cancer 02/21/2017   Advance care planning 03/31/2014   PSA elevation 03/27/2013   Thrombocytopenia, unspecified (HCC) 03/27/2013   Cough 03/12/2013   ED (erectile dysfunction) 12/28/2011   Neoplasm of uncertain behavior of skin 12/27/2010   Exertional chest pain 01/05/2010   Essential hypertension 04/08/2007   HYPERTRIGLYCERIDEMIA 01/08/2007   GILBERT'S  SYNDROME 01/08/2007   ULCERATIVE COLITIS 01/08/2007    ONSET DATE: 07/25/2022   REFERRING DIAG: I61.9 (ICD-10-CM) - Intraparenchymal hemorrhage of brain (HCC)   THERAPY DIAG:  Cognitive communication deficit  Intraparenchymal hemorrhage of brain (HCC)  Rationale for Evaluation and Treatment Rehabilitation  SUBJECTIVE:   PERTINENT HISTORY and DIAGNOSTIC FINDINGS: Pt is a right handed 77 year old male with past medical history of HLD, HTN, CAD with recent intra parenchymal hemorrhage on 07/25/2022. MRI (07/26/2022) revealed "known acute hemorrhage in the left thalamus measuring up to 2 cm and chronic small vessel ischemia with chronic microhemorrhages."   PAIN:  Are you having pain? No   FALLS: Has patient fallen in last 6 months?  No  LIVING ENVIRONMENT: Lives with: lives with their spouse Lives in: House/apartment  PLOF:  Level of assistance: Independent with ADLs, Independent with IADLs Employment: Full-time employment   PATIENT GOALS   to get better  SUBJECTIVE STATEMENT: Pt reports that it took him an hour to fill out a deposit slip Pt accompanied by: self  OBJECTIVE:   TODAY'S TREATMENT:  Skilled ST session focused on pt's cognitive communication goals. SLP facilitated the session by providing the following interventions:  "I was driving this weekend and just decided that I didn't wan to drive anymore so I pulled over and [name] started driving." "Something just comes over me syaing you don't need to drive With further questioning pt states that he was driving after he became frustration with inaccuracy when filling out a  deposit slip for work. SLP had pt complete several deposit slips and add using calculator. Initially pt entered incorrect numbers on the calculator but with cues, he was able to self-monitor and self-correct.   Skilled education provided on need to continue active involvement in cognitive activities despite their difficulty or his struggle. For  example, after struggling with deposit he reports telling his wife "you are just going to need to do the deposits."    PATIENT EDUCATION: Education details: see above Person educated: Patient  Education method: Explanation Education comprehension: needs further education  HOME EXERCISE PROGRAM:        Read out loud  Solve complex math problems  GOALS:  Goals reviewed with patient? Yes  SHORT TERM GOALS: Target date: 10 sessions  Updated 10/04/2022 With Min A, patient will describe visual scenes using 3 or more sentences at 95% accuracy. Baseline: Goal status: INITIAL: MET - upgraded to With supervision A, patient will describe visual scenes using 3 or more sentences at 95% accuracy.   2.  Pt will report improved cognitive communication via PROM by 5 points at last ST session    Baseline:  Goal status: INITIAL; progress made  3.   With Min A, patient will recall functional verbal information 80% accuracy with use of compensations/strategies (repeats, rephrasing, notetaking, etc).   Baseline:  Goal status: INITIAL; MET; upgraded to  With supervision A, patient will recall functional verbal information 80% accuracy with use of compensations/strategies (repeats, rephrasing, notetaking, etc).   4.  With Min A, patient will generate sentences with 3 or more words in response to a situation at 95% accuracy in order to increase ability to communicate basic wants and needs.    Baseline:  Goal status: INITIAL: MET upgraded to With supervision A, patient will generate sentences with 3 or more words in response to a situation at 95% accuracy in order to increase ability to communicate basic wants and needs.   LONG TERM GOALS: Target date: 11/07/2022  With Supervision A, patient will participate in complex conversation at 90% accuracy to increase ability to communicate complex thoughts, feelings, and needs.  Baseline:  Goal status: INITIAL: ongoing  2.   With Supervision A, patient will  demonstrate strategies for processing/recall (eg notetaking, paraphrasing) successfully by recalling >90% of details from 15 minutes verbal information.   Baseline:  Goal status: INITIAL: ongoing  ASSESSMENT:  CLINICAL IMPRESSION: Patient is a 77 y.o. right handed male who was seen today for cognitive communication treatment session d/t recent left thalamic hemorrhage. While pt struggled with completing today's assignment, he states that he performance was not far from his baseline prior to most recent CVA. Pt receptive to creating potential plan to part time work. Pt attempted increased involvement in cognitive activities but continues to struggle with mild higher level cognitive communication impairments. See skilled treatment note for details.   OBJECTIVE IMPAIRMENTS include memory, executive functioning, expressive language, and aphasia. These impairments are limiting patient from return to work, managing medications, managing appointments, managing finances, household responsibilities, ADLs/IADLs, and effectively communicating at home and in community. Factors affecting potential to achieve goals and functional outcome are  N/A .Marland Kitchen Patient will benefit from skilled SLP services to address above impairments and improve overall function.  REHAB POTENTIAL: Excellent  PLAN: SLP FREQUENCY: 1-2x/week  SLP DURATION: 12 weeks  PLANNED INTERVENTIONS: Language facilitation, Cueing hierachy, Cognitive reorganization, Internal/external aids, Functional tasks, SLP instruction and feedback, Compensatory strategies, and Patient/family education   [place]ville Espina B. Dreama Saa, M.S., CCC-SLP,  Warehouse manager Injury Specialist [facility]  [facility] Services Office [phone] Ascom [phone] Fax [phone]             ++

## 2022-10-15 NOTE — Therapy (Addendum)
OUTPATIENT OCCUPATIONAL THERAPY NEURO TREATMENT/DISCHARGE NOTE  Patient Name: Billy Kim MRN: [id number] DOB:April 16, 1945, 77 y.o., male Today's Date: 10/15/2022  PCP: Joaquim Nam, MD REFERRING PROVIDER: Charlton Amor, PA-C  END OF SESSION:  OT End of Session - 10/15/22 1019     Visit Number 8    Number of Visits 12    Date for OT Re-Evaluation 11/14/22    OT Start Time 1015    OT Stop Time 1100    OT Time Calculation (min) 45 min    Activity Tolerance Patient tolerated treatment well    Behavior During Therapy [facility] for tasks assessed/performed            Past Medical History:  Diagnosis Date   Coronary artery disease    a. 11/2019 Cath: Moderate proximal LAD disease (not hemodynamically significant) and CTO's of D1 and non-dominant RCA.   HLD (hyperlipidemia)    Hypertension    Intracerebral hemorrhage (HCC)    a. 07/2022 CT Head: acute intraparenchymal hemorrhage in the left thalamus without midline shift or mass effect.   Systolic murmur    a. 07/2022 Echo: EF 60-65%, no rwma, GrI DD, nl RV fxn, RVSP 15.56mmHg, no significant valvular disease.   Ulcerative colitis 06/1979   Remission for years   Past Surgical History:  Procedure Laterality Date   CARDIAC CATHETERIZATION  03/20/01   Cardiolite EF 55% 02/10/02   CHOLECYSTECTOMY N/A 07/09/2017   Procedure: LAPAROSCOPIC CHOLECYSTECTOMY;  Surgeon: Jimmye Norman, MD;  Location: [facility] OR;  Service: General;  Laterality: N/A;   COLONOSCOPY  multiple   ENDOSCOPIC RETROGRADE CHOLANGIOPANCREATOGRAPHY (ERCP) WITH PROPOFOL N/A 07/08/2017   Procedure: ENDOSCOPIC RETROGRADE CHOLANGIOPANCREATOGRAPHY (ERCP) WITH PROPOFOL;  Surgeon: Lynann Bologna, MD;  Location: [facility] ENDOSCOPY;  Service: Endoscopy;  Laterality: N/A;   INGUINAL HERNIA REPAIR  04/09/06   Bilateral   LAPAROSCOPIC APPENDECTOMY  03/1981   LEFT HEART CATH AND CORONARY ANGIOGRAPHY Left 12/11/2019   Procedure: LEFT HEART CATH AND CORONARY ANGIOGRAPHY;  Surgeon: Yvonne Kendall, MD;  Location: [facility] INVASIVE CV LAB;  Service: Cardiovascular;  Laterality: Left;   REMOVAL OF STONES  07/08/2017   Procedure: REMOVAL OF STONES;  Surgeon: Lynann Bologna, MD;  Location: [facility] ENDOSCOPY;  Service: Endoscopy;;   SPHINCTEROTOMY  07/08/2017   Procedure: Dennison Mascot;  Surgeon: Lynann Bologna, MD;  Location: [facility] ENDOSCOPY;  Service: Endoscopy;;   Patient Active Problem List   Diagnosis Date Noted   Sinusitis 09/20/2022   Intraparenchymal hemorrhage of brain (HCC) 07/31/2022   AKI (acute kidney injury) (HCC) 07/27/2022   Hypertensive emergency 07/27/2022   Nontraumatic subcortical hemorrhage of left cerebral hemisphere (HCC) 07/25/2022   Weakness 07/20/2022   Leg swelling 07/20/2022   Dizziness 11/03/2021   Abscess 08/07/2021   Actinic keratosis 08/07/2021   Melanocytic nevi of trunk 08/07/2021   Nevus of back 08/07/2021   Personal history of other malignant neoplasm of skin 08/07/2021   Medicare annual wellness visit, subsequent 11/09/2020   Coronary artery disease of native artery of native heart with stable angina pectoris (HCC) 12/17/2019   Mixed hyperlipidemia 12/17/2019   Accelerating angina (HCC) 12/11/2019   Shingles 03/01/2019   FH: prostate cancer 02/21/2017   Advance care planning 03/31/2014   PSA elevation 03/27/2013   Thrombocytopenia, unspecified (HCC) 03/27/2013   Cough 03/12/2013   ED (erectile dysfunction) 12/28/2011   Neoplasm of uncertain behavior of skin 12/27/2010   Exertional chest pain 01/05/2010   Essential hypertension 04/08/2007   HYPERTRIGLYCERIDEMIA 01/08/2007   GILBERT'S SYNDROME 01/08/2007  ULCERATIVE COLITIS 01/08/2007   ONSET DATE: 07/25/2022  REFERRING DIAG: Intraparenchymal hemorrhage of brain (HCC)  THERAPY DIAG:  Muscle weakness (generalized)  Other lack of coordination  Rationale for Evaluation and Treatment: Rehabilitation  SUBJECTIVE:  SUBJECTIVE STATEMENT:  Pt reports that his dizziness is better  today.  Pt accompanied by: self  PERTINENT HISTORY: Pt. Is a 77 y.o. male who has been diagnosed with a Intraparenchymal hemorrhage of the brain (HCC). Other dx include AKI, hypertension, hyperlipidemia, and history of CAD and ulcerative colitis. Pt. Presents with expressive communication deficits, R inattention, and R hemiparesis and impaired balance.   PRECAUTIONS: No driving WEIGHT BEARING RESTRICTIONS: No  PAIN:  Are you having pain? No  FALLS: Has patient fallen in last 6 months? No  LIVING ENVIRONMENT: Lives with: lives with their spouse Lives in: 2 story home Stairs: Yes: Internal: 14 steps; and External: 4 steps; can reach both Has following equipment at home: None  PLOF: Independent  PATIENT GOALS: Work on improving memory, and balance on the R side  OBJECTIVE:   HAND DOMINANCE: Right  ADLs:  Eating: Independent however holds utensils differently with the right hand, grabs handle with entire hand Grooming: Independent UB Dressing: Independent LB Dressing: Takes longer to put on pants due to balance, independent with tying shoes and putting socks on when sitting down Toileting: Independent Bathing: Tub shower, standing, holds onto wall to keep balance Tub Shower transfers: Independent, hold onto wall Equipment: none  IADLs: Shopping: Does with spouse, pushes cart to help balance,  Light housekeeping: Wife does most of it now, increased time due to being cautious to ensure he does not lose his balance Meal Prep: Wife prepares all meals, able to go to fridge to get out a drink Community mobility: Uses grocery cart to balance Medication management: Wife sets them out each day for him, pt. Takes them independently Financial management: Spouse handles finance Handwriting: 100% legible  MOBILITY STATUS: Independent however often has to hold onto things to ensure balance  POSTURE COMMENTS:  No Significant postural limitations Sitting balance:  WFL  ACTIVITY  TOLERANCE: Activity tolerance: WFL  FUNCTIONAL OUTCOME MEASURES: FOTO: Eval: 98 TR:98  (FOTO goal is not indicated at this time) D/C: FOTO 98  UPPER EXTREMITY ROM:    Active ROM Right WFL Left WFL  Shoulder flexion    Shoulder abduction    Shoulder adduction    Shoulder extension    Shoulder internal rotation    Shoulder external rotation    Elbow flexion    Elbow extension    Wrist flexion    Wrist extension    Wrist ulnar deviation    Wrist radial deviation    Wrist pronation    Wrist supination    (Blank rows = not tested)  UPPER EXTREMITY MMT:     MMT Right eval Left eval  Shoulder flexion 5/5 5/5  Shoulder abduction 5/5 5/5  Shoulder adduction    Shoulder extension    Shoulder internal rotation    Shoulder external rotation    Middle trapezius    Lower trapezius    Elbow flexion 5/5 5/5  Elbow extension 5/5 5/5  Wrist flexion 5/5 5/5  Wrist extension    Wrist ulnar deviation    Wrist radial deviation    Wrist pronation    Wrist supination    (Blank rows = not tested)  HAND FUNCTION: Grip strength: Right: 87 lbs; Left: 102 lbs, Lateral pinch: Right: 21 lbs, Left: 21 lbs, and 3  point pinch: Right: 17 lbs, Left: 18 lbs 09/10/22: Grip strength: Right: 96 lbs  8/262024: Grip strength: Right: 96 lbs; Left: 102 lbs, Lateral pinch: Right: 21 lbs, Left: 22 lbs, and 3 point pinch: Right: 17 lbs, Left: 19 lbs  COORDINATION: 9 Hole Peg test: Right: 29 sec; Left: 25 sec  10/15/2022: Hole Peg test: Right: 21 sec; Left: 20 sec SENSATION: Light touch: WFL Stereognosis: WFL  EDEMA: None  MUSCLE TONE: None  COGNITION: Overall cognitive status: Impaired memory  VISION: Subjective report: Pt. Reports he has not noticed any vision changes. Baseline vision: Wears glasses all the time Visual history:  None  VISION ASSESSMENT: To be further assessed in functional context  PERCEPTION: TBD  PRAXIS: WFL  TODAY'S TREATMENT:                                                                                                                               DATE: 10/15/2022  Measurements were obtained, and goals were reviewed with the Pt.       PATIENT EDUCATION: Education details: use of a pill organizer to reduce forgetfulness with taking meds/vitamins Person educated: Patient Education method: Explanation, Demonstration, and Verbal cues Education comprehension: verbalized understanding  HOME EXERCISE PROGRAM: Theraputty exercises for R hand strengthening   GOALS: Goals reviewed with patient? Yes  SHORT TERM GOALS: Target date: 10/03/2022    Pt. Will be independent with HEP for increasing R hand strength and coordination. Baseline: Currently no HEP Goal status: INITIAL  LONG TERM GOALS: Target date: 11/14/22  Pt. Will increase R grip strength by 5# to be able to securely hold and carry objects for ADL/IADL tasks.  Baseline: 10/15/2022: Grip strength: Right: 96 lbs; Left: 102 lbs Eval: Grip strength: Right: 87 lbs; Left: 102 lbs Goal status: INITIAL  2.  Pt. Will improve R FMC skills by 2 seconds of speed to independently manipulate small objects during ADL/IADL tasks.  Baseline: 10/15/2022: Hole Peg test: Right: 21 sec; Left: 20 sec Eval: 9 hole peg test: L: 25 seconds, R: 29 seconds Goal status: INITIAL  3.  Pt. Will demonstrate simulated pill box setup with supervision.  Baseline: 10/15/2022: Pt. fills a pillbox if going overnight with his wife supervising. Pt. Does not use a pillbox on a daily basis. Pt.'s wife supervises daily medication management. Eval: Pts. Wife currently sets out medicine for Pt. To take, Pt. Has pill organizer but does not use it yet. Goal status: Met  4.  Pt. Will independently demonstrate holding utensil with proper form to increase engagement with eating tasks.   Baseline:  10/15/2022: Pt. Is independently holding, and handling utensils. Eval:  Pt. Currently uses full grasp to hold utensil while eating rather  than  Goal status: INITIAL   ASSESSMENT:  CLINICAL IMPRESSION:  Pt. has made excellent progress with bilateral grip strength, pinch strength, and FMC skills. Pt.'s FOTO score is 98. Pt. Has met the goals established at the initial evaluation.  Pt. Is independent with HEPs for hand strengthening, and FMC. Discharge form OT services is warranted at this time. Pt. Is in agreement.   PERFORMANCE DEFICITS: in functional skills including ADLs, IADLs, coordination, strength, and UE functional use, cognitive skills including memory, and psychosocial skills including coping strategies, environmental adaptation, and routines and behaviors.   IMPAIRMENTS: are limiting patient from ADLs, IADLs, and work.   CO-MORBIDITIES: has no other co-morbidities that affects occupational performance. Patient will benefit from skilled OT to address above impairments and improve overall function.  MODIFICATION OR ASSISTANCE TO COMPLETE EVALUATION: No modification of tasks or assist necessary to complete an evaluation.  OT OCCUPATIONAL PROFILE AND HISTORY: Detailed assessment: Review of records and additional review of physical, cognitive, psychosocial history related to current functional performance.  CLINICAL DECISION MAKING: Moderate - several treatment options, min-mod task modification necessary  REHAB POTENTIAL: Good  EVALUATION COMPLEXITY: Moderate    PLAN:  OT FREQUENCY: 1x/week  OT DURATION: 12 weeks  PLANNED INTERVENTIONS: self care/ADL training, therapeutic exercise, therapeutic activity, neuromuscular re-education, and patient/family education  RECOMMENDED OTHER SERVICES: ST, PT  CONSULTED AND AGREED WITH PLAN OF CARE: Patient  PLAN FOR NEXT SESSION: Initiate Treatment  Olegario Messier, MS, OTR/L  10/15/2022, 10:21 AM

## 2022-10-17 ENCOUNTER — Ambulatory Visit: Payer: PPO | Admitting: Speech Pathology

## 2022-10-17 ENCOUNTER — Ambulatory Visit: Payer: PPO

## 2022-10-17 ENCOUNTER — Ambulatory Visit: Payer: PPO | Admitting: Occupational Therapy

## 2022-10-17 DIAGNOSIS — R41841 Cognitive communication deficit: Secondary | ICD-10-CM

## 2022-10-17 DIAGNOSIS — M6281 Muscle weakness (generalized): Secondary | ICD-10-CM | POA: Diagnosis not present

## 2022-10-17 DIAGNOSIS — I619 Nontraumatic intracerebral hemorrhage, unspecified: Secondary | ICD-10-CM

## 2022-10-17 NOTE — Therapy (Signed)
OUTPATIENT SPEECH LANGUAGE PATHOLOGY  TREATMENT NOTE   Patient Name: Billy Kim MRN: [id number] DOB:Jan 29, 1946, 77 y.o., male Today's Date: 10/17/2022  PCP: Crawford Givens, MD REFERRING PROVIDER: Claudette Laws, MD    End of Session - 10/17/22 1103     Visit Number 14    Number of Visits 25    Date for SLP Re-Evaluation 11/07/22    Authorization Type [employer]    Progress Note Due on Visit 20    SLP Start Time 1100    SLP Stop Time  1145    SLP Time Calculation (min) 45 min    Activity Tolerance Patient tolerated treatment well             No past medical history on file. The histories are not reviewed yet. Please review them in the "History" navigator section and refresh this SmartLink. Patient Active Problem List   Diagnosis Date Noted   Sinusitis 09/20/2022   Intraparenchymal hemorrhage of brain (HCC) 07/31/2022   AKI (acute kidney injury) (HCC) 07/27/2022   Hypertensive emergency 07/27/2022   Nontraumatic subcortical hemorrhage of left cerebral hemisphere (HCC) 07/25/2022   Weakness 07/20/2022   Leg swelling 07/20/2022   Dizziness 11/03/2021   Abscess 08/07/2021   Actinic keratosis 08/07/2021   Melanocytic nevi of trunk 08/07/2021   Nevus of back 08/07/2021   Personal history of other malignant neoplasm of skin 08/07/2021   Medicare annual wellness visit, subsequent 11/09/2020   Coronary artery disease of native artery of native heart with stable angina pectoris (HCC) 12/17/2019   Mixed hyperlipidemia 12/17/2019   Accelerating angina (HCC) 12/11/2019   Shingles 03/01/2019   FH: prostate cancer 02/21/2017   Advance care planning 03/31/2014   PSA elevation 03/27/2013   Thrombocytopenia, unspecified (HCC) 03/27/2013   Cough 03/12/2013   ED (erectile dysfunction) 12/28/2011   Neoplasm of uncertain behavior of skin 12/27/2010   Exertional chest pain 01/05/2010   Essential hypertension 04/08/2007   HYPERTRIGLYCERIDEMIA 01/08/2007   GILBERT'S  SYNDROME 01/08/2007   ULCERATIVE COLITIS 01/08/2007    ONSET DATE: 07/25/2022   REFERRING DIAG: I61.9 (ICD-10-CM) - Intraparenchymal hemorrhage of brain (HCC)   THERAPY DIAG:  Cognitive communication deficit  Intraparenchymal hemorrhage of brain (HCC)  Rationale for Evaluation and Treatment Rehabilitation  SUBJECTIVE:   PERTINENT HISTORY and DIAGNOSTIC FINDINGS: Pt is a right handed 77 year old male with past medical history of HLD, HTN, CAD with recent intra parenchymal hemorrhage on 07/25/2022. MRI (07/26/2022) revealed "known acute hemorrhage in the left thalamus measuring up to 2 cm and chronic small vessel ischemia with chronic microhemorrhages."   PAIN:  Are you having pain? No   FALLS: Has patient fallen in last 6 months?  No  LIVING ENVIRONMENT: Lives with: lives with their spouse Lives in: House/apartment  PLOF:  Level of assistance: Independent with ADLs, Independent with IADLs Employment: Full-time employment   PATIENT GOALS   to get better  SUBJECTIVE STATEMENT: Pt reports that he played his organ yesterday "the more I played the worse I got" Pt accompanied by: self  OBJECTIVE:   TODAY'S TREATMENT:  Skilled ST session focused on pt's cognitive communication goals. SLP facilitated the session by providing the following interventions:  Pt reports "I didn't do much yesterday, after I played the organ, I just sat down and watched TV until she got back."  To better assess pt's strengths/weaknesses SLP administered the Neuro-QOL Cognition and also requested pt's wife attend next session to better assess what activities are more difficult for  pt post CVA  The Neuro-QOLT Item Bank v2.0-Cognition Function-Short Form is an eight-item test designed to measure difficulties with cognitive functioning (e.g., memory, attention and decision making or in the application of such abilities to everyday tasks (e.g., planning, organizing, calculating, remembering and  learning). Source: Duwaine Maxin, J-S, et al. (2012). Neuro-QOL: brief measures of health-related quality of life for clinical research in neurology. Neurology, 78(23), [phone].   In the past 7 days...  I had to read something several times to understand it. 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 1- Very Often (several times a day)  I had to work really hard to pay attention or I would make a mistake. 3- Sometimes (2-3 times)  "I am not doing as much as I use everyday" I had trouble concentrating. 1- Very Often (several times a day) "At least once a day, maybe more - sometimes my mind doesn't work got that was before the stroke"  How much DIFFICULTY do you currently have...  Reading and following complex instructions (e.g., directions for a new medication)? 3- Somewhat  "I get [name] to read it and we both figure it out" Planning for and keeping appointments that are not part of your weekly routine (e.g., a therapy or doctor appointment, or a social gathering with friends and family)? 3- Somewhat  Managing your time to do most of your daily activities? 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 3- Somewhat  "I think it just depends on what it is but it is difficult for me"  T-SCORE: 34; 1.5 SD below mean of 50   PATIENT EDUCATION: Education details: see above Person educated: Patient  Education method: Explanation Education comprehension: needs further education  HOME EXERCISE PROGRAM:        Read out loud  Solve complex math problems  GOALS:  Goals reviewed with patient? Yes  SHORT TERM GOALS: Target date: 10 sessions  Updated 10/04/2022 With Min A, patient will describe visual scenes using 3 or more sentences at 95% accuracy. Baseline: Goal  status: INITIAL: MET - upgraded to With supervision A, patient will describe visual scenes using 3 or more sentences at 95% accuracy.   2.  Pt will report improved cognitive communication via PROM by 5 points at last ST session    Baseline:  Goal status: INITIAL; progress made  3.   With Min A, patient will recall functional verbal information 80% accuracy with use of compensations/strategies (repeats, rephrasing, notetaking, etc).   Baseline:  Goal status: INITIAL; MET; upgraded to  With supervision A, patient will recall functional verbal information 80% accuracy with use of compensations/strategies (repeats, rephrasing, notetaking, etc).   4.  With Min A, patient will generate sentences with 3 or more words in response to a situation at 95% accuracy in order to increase ability to communicate basic wants and needs.    Baseline:  Goal status: INITIAL: MET upgraded to With supervision A, patient will generate sentences with 3 or more words in response to a situation at 95% accuracy in order to increase ability to communicate basic wants and needs.   LONG TERM GOALS: Target date: 11/07/2022  With Supervision A, patient will participate in complex conversation at 90% accuracy to increase ability to communicate complex thoughts, feelings, and needs.  Baseline:  Goal status: INITIAL: ongoing  2.   With Supervision A, patient will demonstrate strategies for processing/recall (eg notetaking, paraphrasing) successfully by recalling >90% of details from 15 minutes verbal information.   Baseline:  Goal status: INITIAL: ongoing  ASSESSMENT:  CLINICAL IMPRESSION: Patient is a 77 y.o. right handed male who was seen today for cognitive communication treatment session d/t recent left thalamic hemorrhage. While pt struggled with completing today's assignment, he states that he performance was not far from his baseline prior to most recent CVA. Pt receptive to creating potential plan to part time work.  Pt attempted increased involvement in cognitive activities but continues to struggle with mild higher level cognitive communication impairments. Wife to attend next session. See skilled treatment note for details.   OBJECTIVE IMPAIRMENTS include memory, executive functioning, expressive language, and aphasia. These impairments are limiting patient from return to work, managing medications, managing appointments, managing finances, household responsibilities, ADLs/IADLs, and effectively communicating at home and in community. Factors affecting potential to achieve goals and functional outcome are  N/A .Marland Kitchen Patient will benefit from skilled SLP services to address above impairments and improve overall function.  REHAB POTENTIAL: Excellent  PLAN: SLP FREQUENCY: 1-2x/week  SLP DURATION: 12 weeks  PLANNED INTERVENTIONS: Language facilitation, Cueing hierachy, Cognitive reorganization, Internal/external aids, Functional tasks, SLP instruction and feedback, Compensatory strategies, and Patient/family education   Felisa Zechman B. Dreama Saa, M.S., CCC-SLP, CBIS Speech-Language Pathologist Certified Brain Injury Specialist [facility]  [facility] [phone] Ascom [phone] Fax [phone]             ++

## 2022-10-23 ENCOUNTER — Ambulatory Visit: Payer: PPO

## 2022-10-23 ENCOUNTER — Ambulatory Visit: Payer: PPO | Attending: Physician Assistant

## 2022-10-23 DIAGNOSIS — R41841 Cognitive communication deficit: Secondary | ICD-10-CM | POA: Insufficient documentation

## 2022-10-23 DIAGNOSIS — I619 Nontraumatic intracerebral hemorrhage, unspecified: Secondary | ICD-10-CM | POA: Insufficient documentation

## 2022-10-23 NOTE — Therapy (Signed)
OUTPATIENT SPEECH LANGUAGE PATHOLOGY  TREATMENT NOTE   Patient Name: Billy Kim MRN: [id number] DOB:[birth year], 77 y.o., male Today's Date: 10/23/2022  PCP: Crawford Givens, MD REFERRING PROVIDER: Claudette Laws, MD    End of Session - 10/23/22 1359     Visit Number 15    Number of Visits 25    Date for SLP Re-Evaluation 11/07/22    Authorization Type [employer]    Progress Note Due on Visit 20    SLP Start Time 1400    SLP Stop Time  1445    SLP Time Calculation (min) 45 min             No past medical history on file. The histories are not reviewed yet. Please review them in the "History" navigator section and refresh this SmartLink. Patient Active Problem List   Diagnosis Date Noted   Sinusitis 09/20/2022   Intraparenchymal hemorrhage of brain (HCC) 07/31/2022   AKI (acute kidney injury) (HCC) 07/27/2022   Hypertensive emergency 07/27/2022   Nontraumatic subcortical hemorrhage of left cerebral hemisphere (HCC) 07/25/2022   Weakness 07/20/2022   Leg swelling 07/20/2022   Dizziness 11/03/2021   Abscess 08/07/2021   Actinic keratosis 08/07/2021   Melanocytic nevi of trunk 08/07/2021   Nevus of back 08/07/2021   Personal history of other malignant neoplasm of skin 08/07/2021   Medicare annual wellness visit, subsequent 11/09/2020   Coronary artery disease of native artery of native heart with stable angina pectoris (HCC) 12/17/2019   Mixed hyperlipidemia 12/17/2019   Accelerating angina (HCC) 12/11/2019   Shingles 03/01/2019   FH: prostate cancer 02/21/2017   Advance care planning 03/31/2014   PSA elevation 03/27/2013   Thrombocytopenia, unspecified (HCC) 03/27/2013   Cough 03/12/2013   ED (erectile dysfunction) 12/28/2011   Neoplasm of uncertain behavior of skin 12/27/2010   Exertional chest pain 01/05/2010   Essential hypertension 04/08/2007   HYPERTRIGLYCERIDEMIA 01/08/2007   GILBERT'S SYNDROME 01/08/2007   ULCERATIVE COLITIS 01/08/2007     ONSET DATE: 07/25/2022   REFERRING DIAG: I61.9 (ICD-10-CM) - Intraparenchymal hemorrhage of brain (HCC)   THERAPY DIAG:  Cognitive communication deficit  Intraparenchymal hemorrhage of brain (HCC)  Rationale for Evaluation and Treatment Rehabilitation  SUBJECTIVE:   PERTINENT HISTORY and DIAGNOSTIC FINDINGS: Pt is a right handed 77 year old male with past medical history of HLD, HTN, CAD with recent intra parenchymal hemorrhage on 07/25/2022. MRI (07/26/2022) revealed "known acute hemorrhage in the left thalamus measuring up to 2 cm and chronic small vessel ischemia with chronic microhemorrhages."   PAIN:  Are you having pain? No   FALLS: Has patient fallen in last 6 months?  No  LIVING ENVIRONMENT: Lives with: lives with their spouse Lives in: House/apartment  PLOF:  Level of assistance: Independent with ADLs, Independent with IADLs Employment: Full-time employment   PATIENT GOALS   to get better  SUBJECTIVE STATEMENT: Pt pleasant and cooperative. Pt accompanied by: wife  OBJECTIVE:   TODAY'S TREATMENT:  Skilled ST session focused on pt's cognitive communication goals. SLP facilitated the session by providing the following interventions:  Therapeutic interviewing with pt and wife - pt and wife endorse pt with increased difficulty managing finances for business (takes significantly increased time). Wife also stated pt with difficulty managing medication (forgets to take AM meds; Musician and will begin utilizing), requiring explicit/step-by-step directions to do things around the house, and driving (with supervision as per MD - per wife; pt with tendency to drift to R;). Wife endorses pt with  significant improvement across the board with cognitive-linguistic ability, but stated pt with occasional anomia. Pt's wife concerned for wordfinding ability during stressful situations, pt provided with an aphasia card for wallet.   Pt and wife educated re: progress  to date, basic strategies to promote improved attention/memory/cognition (including routine, organization, use of reminders, physical activity, and sleep hygiene).Marland Kitchen  PATIENT EDUCATION: Education details: see above Person educated: Patient, Wife  Education method: Explanation Education comprehension: needs further education  HOME EXERCISE PROGRAM:        Read out loud  Solve complex math problems  GOALS:  Goals reviewed with patient? Yes  SHORT TERM GOALS: Target date: 10 sessions  Updated 10/04/2022 With Min A, patient will describe visual scenes using 3 or more sentences at 95% accuracy. Baseline: Goal status: INITIAL: MET - upgraded to With supervision A, patient will describe visual scenes using 3 or more sentences at 95% accuracy.   2.  Pt will report improved cognitive communication via PROM by 5 points at last ST session    Baseline:  Goal status: INITIAL; progress made  3.   With Min A, patient will recall functional verbal information 80% accuracy with use of compensations/strategies (repeats, rephrasing, notetaking, etc).   Baseline:  Goal status: INITIAL; MET; upgraded to  With supervision A, patient will recall functional verbal information 80% accuracy with use of compensations/strategies (repeats, rephrasing, notetaking, etc).   4.  With Min A, patient will generate sentences with 3 or more words in response to a situation at 95% accuracy in order to increase ability to communicate basic wants and needs.    Baseline:  Goal status: INITIAL: MET upgraded to With supervision A, patient will generate sentences with 3 or more words in response to a situation at 95% accuracy in order to increase ability to communicate basic wants and needs.   LONG TERM GOALS: Target date: 11/07/2022  With Supervision A, patient will participate in complex conversation at 90% accuracy to increase ability to communicate complex thoughts, feelings, and needs.  Baseline:  Goal status: INITIAL:  ongoing  2.   With Supervision A, patient will demonstrate strategies for processing/recall (eg notetaking, paraphrasing) successfully by recalling >90% of details from 15 minutes verbal information.   Baseline:  Goal status: INITIAL: ongoing  ASSESSMENT:  CLINICAL IMPRESSION: Patient is a 77 y.o. right handed male who was seen today for cognitive communication treatment session d/t recent left thalamic hemorrhage. See See skilled treatment note for details.   OBJECTIVE IMPAIRMENTS include memory, executive functioning, expressive language, and aphasia. These impairments are limiting patient from return to work, managing medications, managing appointments, managing finances, household responsibilities, ADLs/IADLs, and effectively communicating at home and in community. Factors affecting potential to achieve goals and functional outcome are  N/A .Marland Kitchen Patient will benefit from skilled SLP services to address above impairments and improve overall function.  REHAB POTENTIAL: Excellent  PLAN: SLP FREQUENCY: 1-2x/week  SLP DURATION: 12 weeks  PLANNED INTERVENTIONS: Language facilitation, Cueing hierachy, Cognitive reorganization, Internal/external aids, Functional tasks, SLP instruction and feedback, Compensatory strategies, and Patient/family education   Clyde Canterbury, M.S., CCC-SLP Speech-Language Pathologist [place] - [facility] [phone] (ASCOM)              ++

## 2022-10-24 ENCOUNTER — Ambulatory Visit: Payer: PPO | Admitting: Physical Therapy

## 2022-10-24 ENCOUNTER — Encounter: Payer: PPO | Admitting: Occupational Therapy

## 2022-10-25 ENCOUNTER — Encounter: Payer: PPO | Admitting: Occupational Therapy

## 2022-10-25 ENCOUNTER — Ambulatory Visit: Payer: PPO | Admitting: Speech Pathology

## 2022-10-25 ENCOUNTER — Ambulatory Visit: Payer: PPO

## 2022-10-29 ENCOUNTER — Ambulatory Visit: Payer: PPO | Admitting: Speech Pathology

## 2022-10-29 ENCOUNTER — Encounter: Payer: PPO | Admitting: Occupational Therapy

## 2022-10-29 ENCOUNTER — Ambulatory Visit: Payer: PPO | Admitting: Physical Therapy

## 2022-10-29 DIAGNOSIS — I619 Nontraumatic intracerebral hemorrhage, unspecified: Secondary | ICD-10-CM

## 2022-10-29 DIAGNOSIS — R41841 Cognitive communication deficit: Secondary | ICD-10-CM | POA: Diagnosis not present

## 2022-10-29 NOTE — Therapy (Signed)
OUTPATIENT SPEECH LANGUAGE PATHOLOGY  TREATMENT NOTE   Patient Name: Billy Kim MRN: [id number] DOB:[birth year], 77 y.o., male Today's Date: 10/29/2022  PCP: Crawford Givens, MD REFERRING PROVIDER: Claudette Laws, MD    End of Session - 10/29/22 1100     Visit Number 16    Number of Visits 25    Date for SLP Re-Evaluation 11/07/22    Authorization Type [employer]    Progress Note Due on Visit 20    SLP Start Time 1100    SLP Stop Time  1145    SLP Time Calculation (min) 45 min    Activity Tolerance Patient tolerated treatment well             No past medical history on file. The histories are not reviewed yet. Please review them in the "History" navigator section and refresh this SmartLink. Patient Active Problem List   Diagnosis Date Noted   Sinusitis 09/20/2022   Intraparenchymal hemorrhage of brain (HCC) 07/31/2022   AKI (acute kidney injury) (HCC) 07/27/2022   Hypertensive emergency 07/27/2022   Nontraumatic subcortical hemorrhage of left cerebral hemisphere (HCC) 07/25/2022   Weakness 07/20/2022   Leg swelling 07/20/2022   Dizziness 11/03/2021   Abscess 08/07/2021   Actinic keratosis 08/07/2021   Melanocytic nevi of trunk 08/07/2021   Nevus of back 08/07/2021   Personal history of other malignant neoplasm of skin 08/07/2021   Medicare annual wellness visit, subsequent 11/09/2020   Coronary artery disease of native artery of native heart with stable angina pectoris (HCC) 12/17/2019   Mixed hyperlipidemia 12/17/2019   Accelerating angina (HCC) 12/11/2019   Shingles 03/01/2019   FH: prostate cancer 02/21/2017   Advance care planning 03/31/2014   PSA elevation 03/27/2013   Thrombocytopenia, unspecified (HCC) 03/27/2013   Cough 03/12/2013   ED (erectile dysfunction) 12/28/2011   Neoplasm of uncertain behavior of skin 12/27/2010   Exertional chest pain 01/05/2010   Essential hypertension 04/08/2007   HYPERTRIGLYCERIDEMIA 01/08/2007   GILBERT'S  SYNDROME 01/08/2007   ULCERATIVE COLITIS 01/08/2007    ONSET DATE: 07/25/2022   REFERRING DIAG: I61.9 (ICD-10-CM) - Intraparenchymal hemorrhage of brain (HCC)   THERAPY DIAG:  Cognitive communication deficit  Intraparenchymal hemorrhage of brain (HCC)  Rationale for Evaluation and Treatment Rehabilitation  SUBJECTIVE:   PERTINENT HISTORY and DIAGNOSTIC FINDINGS: Pt is a right handed 77 year old male with past medical history of HLD, HTN, CAD with recent intra parenchymal hemorrhage on 07/25/2022. MRI (07/26/2022) revealed "known acute hemorrhage in the left thalamus measuring up to 2 cm and chronic small vessel ischemia with chronic microhemorrhages."   PAIN:  Are you having pain? No   FALLS: Has patient fallen in last 6 months?  No  LIVING ENVIRONMENT: Lives with: lives with their spouse Lives in: House/apartment  PLOF:  Level of assistance: Independent with ADLs, Independent with IADLs Employment: Full-time employment   PATIENT GOALS   to get better  SUBJECTIVE STATEMENT: Pt pleasant and cooperative. Pt accompanied by: wife  OBJECTIVE:   TODAY'S TREATMENT:  Skilled ST session focused on pt's cognitive communication goals. SLP facilitated the session by providing the following interventions:  Therapeutic interviewing with pt and wife - pt and wife endorse pt with increased difficulty managing finances for business (takes significantly increased time). Wife also stated pt with difficulty managing medication (forgets to take AM meds; Musician and will begin utilizing), requiring explicit/step-by-step directions to do things around the house, and driving (with supervision as per MD - per wife; pt with  tendency to drift to R;). Wife endorses pt with significant improvement across the board with cognitive-linguistic ability, but stated pt with occasional anomia. Pt's wife concerned for wordfinding ability during stressful situations, pt provided with an aphasia  card for wallet.   Pt and wife educated re: progress to date, basic strategies to promote improved attention/memory/cognition (including routine, organization, use of reminders, physical activity, and sleep hygiene).Marland Kitchen  PATIENT EDUCATION: Education details: see above Person educated: Patient, Wife  Education method: Explanation Education comprehension: needs further education  HOME EXERCISE PROGRAM:        Read out loud  Solve complex math problems  GOALS:  Goals reviewed with patient? Yes  SHORT TERM GOALS: Target date: 10 sessions  Updated 10/04/2022 With Min A, patient will describe visual scenes using 3 or more sentences at 95% accuracy. Baseline: Goal status: INITIAL: MET - upgraded to With supervision A, patient will describe visual scenes using 3 or more sentences at 95% accuracy.   2.  Pt will report improved cognitive communication via PROM by 5 points at last ST session    Baseline:  Goal status: INITIAL; progress made  3.   With Min A, patient will recall functional verbal information 80% accuracy with use of compensations/strategies (repeats, rephrasing, notetaking, etc).   Baseline:  Goal status: INITIAL; MET; upgraded to  With supervision A, patient will recall functional verbal information 80% accuracy with use of compensations/strategies (repeats, rephrasing, notetaking, etc).   4.  With Min A, patient will generate sentences with 3 or more words in response to a situation at 95% accuracy in order to increase ability to communicate basic wants and needs.    Baseline:  Goal status: INITIAL: MET upgraded to With supervision A, patient will generate sentences with 3 or more words in response to a situation at 95% accuracy in order to increase ability to communicate basic wants and needs.   LONG TERM GOALS: Target date: 11/07/2022  With Supervision A, patient will participate in complex conversation at 90% accuracy to increase ability to communicate complex thoughts,  feelings, and needs.  Baseline:  Goal status: INITIAL: ongoing  2.   With Supervision A, patient will demonstrate strategies for processing/recall (eg notetaking, paraphrasing) successfully by recalling >90% of details from 15 minutes verbal information.   Baseline:  Goal status: INITIAL: ongoing  ASSESSMENT:  CLINICAL IMPRESSION: Patient is a 78 y.o. right handed male who was seen today for cognitive communication treatment session d/t recent left thalamic hemorrhage. See See skilled treatment note for details.   OBJECTIVE IMPAIRMENTS include memory, executive functioning, expressive language, and aphasia. These impairments are limiting patient from return to work, managing medications, managing appointments, managing finances, household responsibilities, ADLs/IADLs, and effectively communicating at home and in community. Factors affecting potential to achieve goals and functional outcome are  N/A .Marland Kitchen Patient will benefit from skilled SLP services to address above impairments and improve overall function.  REHAB POTENTIAL: Excellent  PLAN: SLP FREQUENCY: 1-2x/week  SLP DURATION: 12 weeks  PLANNED INTERVENTIONS: Language facilitation, Cueing hierachy, Cognitive reorganization, Internal/external aids, Functional tasks, SLP instruction and feedback, Compensatory strategies, and Patient/family education   Paeton Studer B. Dreama Saa, M.S., CCC-SLP, CBIS Speech-Language Pathologist Certified Brain Injury Specialist [facility]  [facility] [phone] Ascom [phone] Fax [phone]          ++

## 2022-10-31 ENCOUNTER — Ambulatory Visit: Payer: PPO | Admitting: Physical Therapy

## 2022-10-31 ENCOUNTER — Ambulatory Visit: Payer: PPO | Admitting: Speech Pathology

## 2022-10-31 ENCOUNTER — Encounter: Payer: PPO | Admitting: Occupational Therapy

## 2022-11-01 ENCOUNTER — Ambulatory Visit: Payer: PPO | Attending: Internal Medicine | Admitting: Internal Medicine

## 2022-11-01 ENCOUNTER — Encounter: Payer: Self-pay | Admitting: Internal Medicine

## 2022-11-01 ENCOUNTER — Ambulatory Visit: Payer: PPO

## 2022-11-01 VITALS — BP 120/82 | HR 59 | Ht 67.0 in | Wt 177.6 lb

## 2022-11-01 DIAGNOSIS — I10 Essential (primary) hypertension: Secondary | ICD-10-CM

## 2022-11-01 DIAGNOSIS — E785 Hyperlipidemia, unspecified: Secondary | ICD-10-CM

## 2022-11-01 DIAGNOSIS — I619 Nontraumatic intracerebral hemorrhage, unspecified: Secondary | ICD-10-CM

## 2022-11-01 DIAGNOSIS — I25118 Atherosclerotic heart disease of native coronary artery with other forms of angina pectoris: Secondary | ICD-10-CM | POA: Diagnosis not present

## 2022-11-01 DIAGNOSIS — R002 Palpitations: Secondary | ICD-10-CM

## 2022-11-01 NOTE — Patient Instructions (Signed)
Medication Instructions:  Your physician recommends that you continue on your current medications as directed. Please refer to the Current Medication list given to you today.   *If you need a refill on your cardiac medications before your next appointment, please call your pharmacy*   Lab Work: No labs ordered today    Testing/Procedures: Your physician has recommended that you wear a 14 day Zio monitor.   This monitor is a medical device that records the heart's electrical activity. Doctors most often use these monitors to diagnose arrhythmias. Arrhythmias are problems with the speed or rhythm of the heartbeat. The monitor is a small device applied to your chest. You can wear one while you do your normal daily activities. While wearing this monitor if you have any symptoms to push the button and record what you felt. Once you have worn this monitor for the period of time provider prescribed (Usually 14 days), you will return the monitor device in the postage paid box. Once it is returned they will download the data collected and provide [place] with a report which the provider will then review and we will call you with those results. Important tips:  Avoid showering during the first 24 hours of wearing the monitor. Avoid excessive sweating to help maximize wear time. Do not submerge the device, no hot tubs, and no swimming pools. Keep any lotions or oils away from the patch. After 24 hours you may shower with the patch on. Take brief showers with your back facing the shower head.  Do not remove patch once it has been placed because that will interrupt data and decrease adhesive wear time. Push the button when you have any symptoms and write down what you were feeling. Once you have completed wearing your monitor, remove and place into box which has postage paid and place in your outgoing mailbox.  If for some reason you have misplaced your box then call our office and we can provide another box  and/or mail it off for you.     Follow-Up: At [facility], you and your health needs are our priority.  As part of our continuing mission to provide you with exceptional heart care, we have created designated Provider Care Teams.  These Care Teams include your primary Cardiologist (physician) and Advanced Practice Providers (APPs -  Physician Assistants and Nurse Practitioners) who all work together to provide you with the care you need, when you need it.  We recommend signing up for the patient portal called "MyChart".  Sign up information is provided on this After Visit Summary.  MyChart is used to connect with patients for Virtual Visits (Telemedicine).  Patients are able to view lab/test results, encounter notes, upcoming appointments, etc.  Non-urgent messages can be sent to your provider as well.   To learn more about what you can do with MyChart, go to [email or web address].    Your next appointment:   3 month(s)  Provider:   You may see Yvonne Kendall, MD or one of the following Advanced Practice Providers on your designated Care Team:   Nicolasa Ducking, NP Eula Listen, PA-C Cadence Fransico Michael, PA-C Charlsie Quest, NP

## 2022-11-01 NOTE — Progress Notes (Signed)
Cardiology Office Note:  .   Date:  11/01/2022  ID:  Raynelle Fanning Boccio, DOB Apr 14, 1945, MRN [id number] PCP: Joaquim Nam, MD  [facility] Providers Cardiologist:  Yvonne Kendall, MD     History of Present Illness: .   Nerick Hillson Limones is a 77 y.o. male with history of coronary artery disease being managed medically (chronic total occlusions of D1 and nondominant RCA as well as moderate proximal LAD disease that was not significant by iFR/FFR), hypertension, and ulcerative colitis, who presents for follow-up of coronary artery disease, hemorrhagic stroke, and hypertension.  I last saw him in late May, at which time he was feeling fairly well other than a single episode of "slightly uncomfortable feeling" in his upper abdomen and lower chest.  Systolic murmur was more pronounced at that visit, prompting [place] to obtain an echocardiogram.  He was hospitalized in June with left thalamic hemorrhagic stroke.  He was seen in follow-up in mid July by Rhome Givens, NP, at which time he noted some residual right-sided weakness and word finding difficulty, though this was improving.  No medication changes or additional testing were pursued.  Today, Mr. Catala reports that he is feeling fairly well and is continuing to slowly recover from his hemorrhagic stroke in June.  His only complaint is of 2 episodes of palpitations following his stroke.  Both events woke him up in the early morning hours and lasted about an hour.  There were no associated symptoms.  He checked his blood pressure during one of the events and believes it was high.  Home blood pressures are typically around 140-150/90-100.  He continues to have occasional dizziness for which he uses as needed meclizine.  He had a bad sinus infection about a month ago that may have exacerbated this.  He denies chest pain and shortness of breath.  Chronic intermittent edema of the right ankle is similar to prior visits.  ROS: See HPI  Studies Reviewed: Marland Kitchen         TTE (2022/08/17): Normal LV size and wall thickness.  LVEF 60-65% with normal wall motion.  Grade 1 diastolic dysfunction.  Normal RV size and function.  Normal biatrial size.  No pericardial effusion.  No significant valvular abnormality.  Normal CVP.  Risk Assessment/Calculations:             Physical Exam:   VS:  BP 120/82 (BP Location: Left Arm, Patient Position: Sitting, Cuff Size: Normal)   Pulse (!) 59   Ht 5\' 7"  (1.702 m)   Wt 177 lb 9.6 oz (80.6 kg)   SpO2 97%   BMI 27.82 kg/m    Wt Readings from Last 3 Encounters:  11/01/22 177 lb 9.6 oz (80.6 kg)  10/11/22 180 lb (81.6 kg)  10/04/22 178 lb (80.7 kg)    General:  NAD. Neck: No JVD or HJR. Lungs: Clear to auscultation bilaterally without wheezes or crackles. Heart: Regular rate and rhythm without murmurs, rubs, or gallops. Abdomen: Soft, nontender, nondistended. Extremities: No lower extremity edema.  ASSESSMENT AND PLAN: .    Coronary artery disease with stable angina: No angina reported.  Continue secondary prevention and antianginal therapy with ezetimibe, fish oil, carvedilol, and isosorbide mononitrate.  Defer resumption of aspirin therapy in the setting of hemorrhagic stroke this summer.  Palpitations: I have recommended obtaining a 14-day event monitor to ensure that he does not have paroxysmal atrial fibrillation or flutter.  If we were to identify this, we would need to refer  him to electrophysiology for consideration of Watchman implantation given his thalamic hemorrhage on aspirin.  Hypertension: Blood pressure reasonably well-controlled today other than borderline elevated diastolic reading.  Home blood pressures have been running a bit higher.  I will defer medication changes today, but have asked Mr. Shambaugh to keep monitoring his blood pressure at home and to alert [place] if it is consistently above 140/90.  Hyperlipidemia and statin myopathy: Lipids well-controlled on last check in June.  We will continue  ezetimibe in the lieu of statin given history of statin myopathy.  Continue fish oil as well for hypertriglyceridemia.  Hemorrhagic stroke: No new neurologic symptoms reported.  Continue blood pressure control as outlined above.  Follow-up with neurology as previously arranged.    Dispo: Return to clinic in 3 months.  Signed, Yvonne Kendall, MD

## 2022-11-02 ENCOUNTER — Encounter: Payer: Self-pay | Admitting: Internal Medicine

## 2022-11-04 ENCOUNTER — Other Ambulatory Visit: Payer: Self-pay | Admitting: Family Medicine

## 2022-11-04 DIAGNOSIS — Z125 Encounter for screening for malignant neoplasm of prostate: Secondary | ICD-10-CM

## 2022-11-04 DIAGNOSIS — I10 Essential (primary) hypertension: Secondary | ICD-10-CM

## 2022-11-05 ENCOUNTER — Ambulatory Visit: Payer: PPO | Admitting: Physical Therapy

## 2022-11-05 ENCOUNTER — Encounter: Payer: PPO | Admitting: Occupational Therapy

## 2022-11-05 ENCOUNTER — Ambulatory Visit: Payer: PPO | Admitting: Speech Pathology

## 2022-11-06 ENCOUNTER — Other Ambulatory Visit: Payer: PPO

## 2022-11-07 ENCOUNTER — Ambulatory Visit: Payer: PPO | Admitting: Speech Pathology

## 2022-11-07 ENCOUNTER — Ambulatory Visit: Payer: PPO | Admitting: Physical Therapy

## 2022-11-07 ENCOUNTER — Encounter: Payer: PPO | Admitting: Occupational Therapy

## 2022-11-08 ENCOUNTER — Other Ambulatory Visit: Payer: PPO

## 2022-11-09 ENCOUNTER — Other Ambulatory Visit: Payer: PPO

## 2022-11-12 ENCOUNTER — Ambulatory Visit: Payer: PPO | Admitting: Speech Pathology

## 2022-11-12 ENCOUNTER — Ambulatory Visit: Payer: PPO | Admitting: Physical Therapy

## 2022-11-12 ENCOUNTER — Encounter: Payer: PPO | Admitting: Occupational Therapy

## 2022-11-12 DIAGNOSIS — R41841 Cognitive communication deficit: Secondary | ICD-10-CM | POA: Diagnosis not present

## 2022-11-12 DIAGNOSIS — I619 Nontraumatic intracerebral hemorrhage, unspecified: Secondary | ICD-10-CM

## 2022-11-12 NOTE — Therapy (Signed)
OUTPATIENT SPEECH LANGUAGE PATHOLOGY  TREATMENT NOTE RE-CERTIFICATION REQUEST   Patient Name: Billy Kim MRN: [id number] DOB:[birth year], 77 y.o., male Today's Date: 11/12/2022  PCP: Crawford Givens, MD REFERRING PROVIDER: Claudette Laws, MD    End of Session - 11/12/22 1309     Visit Number 17    Number of Visits 41    Date for SLP Re-Evaluation 02/04/23    Authorization Type [employer]    Progress Note Due on Visit 20    SLP Start Time 1145    SLP Stop Time  1230    SLP Time Calculation (min) 45 min    Activity Tolerance Patient tolerated treatment well             No past medical history on file. The histories are not reviewed yet. Please review them in the "History" navigator section and refresh this SmartLink. Patient Active Problem List   Diagnosis Date Noted   Sinusitis 09/20/2022   Intraparenchymal hemorrhage of brain (HCC) 07/31/2022   AKI (acute kidney injury) (HCC) 07/27/2022   Hypertensive emergency 07/27/2022   Nontraumatic subcortical hemorrhage of left cerebral hemisphere (HCC) 07/25/2022   Weakness 07/20/2022   Leg swelling 07/20/2022   Dizziness 11/03/2021   Abscess 08/07/2021   Actinic keratosis 08/07/2021   Melanocytic nevi of trunk 08/07/2021   Nevus of back 08/07/2021   Personal history of other malignant neoplasm of skin 08/07/2021   Medicare annual wellness visit, subsequent 11/09/2020   Coronary artery disease of native artery of native heart with stable angina pectoris (HCC) 12/17/2019   Hyperlipidemia LDL goal <70 12/17/2019   Accelerating angina (HCC) 12/11/2019   Shingles 03/01/2019   FH: prostate cancer 02/21/2017   Advance care planning 03/31/2014   PSA elevation 03/27/2013   Thrombocytopenia, unspecified (HCC) 03/27/2013   Cough 03/12/2013   ED (erectile dysfunction) 12/28/2011   Neoplasm of uncertain behavior of skin 12/27/2010   Exertional chest pain 01/05/2010   Essential hypertension 04/08/2007    HYPERTRIGLYCERIDEMIA 01/08/2007   GILBERT'S SYNDROME 01/08/2007   ULCERATIVE COLITIS 01/08/2007    ONSET DATE: 07/25/2022   REFERRING DIAG: I61.9 (ICD-10-CM) - Intraparenchymal hemorrhage of brain (HCC)   THERAPY DIAG:  Cognitive communication deficit  Intraparenchymal hemorrhage of brain (HCC)  Rationale for Evaluation and Treatment Rehabilitation  SUBJECTIVE:   PERTINENT HISTORY and DIAGNOSTIC FINDINGS: Pt is a right handed 77 year old male with past medical history of HLD, HTN, CAD with recent intra parenchymal hemorrhage on 07/25/2022. MRI (07/26/2022) revealed "known acute hemorrhage in the left thalamus measuring up to 2 cm and chronic small vessel ischemia with chronic microhemorrhages."   PAIN:  Are you having pain? No   FALLS: Has patient fallen in last 6 months?  No  LIVING ENVIRONMENT: Lives with: lives with their spouse Lives in: House/apartment  PLOF:  Level of assistance: Independent with ADLs, Independent with IADLs Employment: Full-time employment   PATIENT GOALS   to get better  SUBJECTIVE STATEMENT: Pt and his wife express they had a great time at the beach, even staying an extra day Pt accompanied by: wife  OBJECTIVE:   TODAY'S TREATMENT:  Skilled ST session focused on pt's cognitive communication goals. SLP facilitated the session by providing the following interventions:  Pt brought in his medication as well as a BID organizer. Pt able to follow initial strategies to help with task organization and only required cues x 1 to increase understanding of "take 1 tablet twice day." Pt and his wife instructed to practice next  week with supervision. Pt also instructed to leave medications on his bathroom counter to improve recall of taking.   Pt also brought in list of ushers from church along with their phone numbers. SLP assisted pt in entering new contact into his phone. Verbal and written information provided with moderate assistance to refer to  written cues to aid in sequencing process. In addition, pt benefited from having paper covering remaining contacts to decrease distractions.   PATIENT EDUCATION: Education details: see above Person educated: Patient, Wife  Education method: Explanation Education comprehension: needs further education  HOME EXERCISE PROGRAM:        Bring in medications and pill Sport and exercise psychologist in Glass blower/designer for church functions  GOALS:  Goals reviewed with patient? Yes  SHORT TERM GOALS: Target date: 10 sessions  Updated 10/04/2022 With Min A, patient will describe visual scenes using 3 or more sentences at 95% accuracy. Baseline: Goal status: INITIAL: MET - upgraded to With supervision A, patient will describe visual scenes using 3 or more sentences at 95% accuracy.   2.  Pt will report improved cognitive communication via PROM by 5 points at last ST session    Baseline:  Goal status: INITIAL; progress made  3.   With Min A, patient will recall functional verbal information 80% accuracy with use of compensations/strategies (repeats, rephrasing, notetaking, etc).   Baseline:  Goal status: INITIAL; MET; upgraded to  With supervision A, patient will recall functional verbal information 80% accuracy with use of compensations/strategies (repeats, rephrasing, notetaking, etc).   4.  With Min A, patient will generate sentences with 3 or more words in response to a situation at 95% accuracy in order to increase ability to communicate basic wants and needs.    Baseline:  Goal status: INITIAL: MET upgraded to With supervision A, patient will generate sentences with 3 or more words in response to a situation at 95% accuracy in order to increase ability to communicate basic wants and needs.   LONG TERM GOALS: Target date: 11/07/2022  With Supervision A, patient will participate in complex conversation at 90% accuracy to increase ability to communicate complex thoughts, feelings, and needs.  Baseline:   Goal status: INITIAL: ongoing  2.   With Supervision A, patient will demonstrate strategies for processing/recall (eg notetaking, paraphrasing) successfully by recalling >90% of details from 15 minutes verbal information.   Baseline:  Goal status: INITIAL: ongoing  ASSESSMENT:  CLINICAL IMPRESSION: Patient is a 77 y.o. right handed male who was seen today for cognitive communication treatment session d/t recent left thalamic hemorrhage. Pt's wife was helpful in establishing some activities that pt is struggling with at home. Pt with good response to strategies introduced during today's session. Will request re-certification to continue addressing the above mentioned goals. See skilled treatment note for details.   OBJECTIVE IMPAIRMENTS include memory, executive functioning, expressive language, and aphasia. These impairments are limiting patient from return to work, managing medications, managing appointments, managing finances, household responsibilities, ADLs/IADLs, and effectively communicating at home and in community. Factors affecting potential to achieve goals and functional outcome are  N/A .Marland Kitchen Patient will benefit from skilled SLP services to address above impairments and improve overall function.  REHAB POTENTIAL: Excellent  PLAN: SLP FREQUENCY: 1-2x/week  SLP DURATION: 12 weeks  PLANNED INTERVENTIONS: Language facilitation, Cueing hierachy, Cognitive reorganization, Internal/external aids, Functional tasks, SLP instruction and feedback, Compensatory strategies, and Patient/family education   Devron Cohick B. Dreama Saa, M.S., CCC-SLP, CBIS Speech-Language Pathologist Certified Brain Injury Specialist [facility]  [facility] Rehabilitation Services Office [phone] Ascom [phone] Fax [phone]          ++

## 2022-11-13 ENCOUNTER — Encounter: Payer: Self-pay | Admitting: Family Medicine

## 2022-11-13 ENCOUNTER — Ambulatory Visit (INDEPENDENT_AMBULATORY_CARE_PROVIDER_SITE_OTHER): Payer: PPO | Admitting: Family Medicine

## 2022-11-13 VITALS — BP 120/78 | HR 61 | Temp 97.7°F | Ht 67.0 in | Wt 181.0 lb

## 2022-11-13 DIAGNOSIS — Z23 Encounter for immunization: Secondary | ICD-10-CM | POA: Diagnosis not present

## 2022-11-13 DIAGNOSIS — E785 Hyperlipidemia, unspecified: Secondary | ICD-10-CM

## 2022-11-13 DIAGNOSIS — I619 Nontraumatic intracerebral hemorrhage, unspecified: Secondary | ICD-10-CM | POA: Diagnosis not present

## 2022-11-13 DIAGNOSIS — R002 Palpitations: Secondary | ICD-10-CM | POA: Diagnosis not present

## 2022-11-13 DIAGNOSIS — I10 Essential (primary) hypertension: Secondary | ICD-10-CM | POA: Diagnosis not present

## 2022-11-13 DIAGNOSIS — Z7189 Other specified counseling: Secondary | ICD-10-CM

## 2022-11-13 DIAGNOSIS — R0789 Other chest pain: Secondary | ICD-10-CM | POA: Diagnosis not present

## 2022-11-13 DIAGNOSIS — Z125 Encounter for screening for malignant neoplasm of prostate: Secondary | ICD-10-CM

## 2022-11-13 DIAGNOSIS — Z0000 Encounter for general adult medical examination without abnormal findings: Secondary | ICD-10-CM

## 2022-11-13 LAB — COMPREHENSIVE METABOLIC PANEL
ALT: 16 U/L (ref 0–53)
AST: 22 U/L (ref 0–37)
Albumin: 3.8 g/dL (ref 3.5–5.2)
Alkaline Phosphatase: 69 U/L (ref 39–117)
BUN: 18 mg/dL (ref 6–23)
CO2: 29 mEq/L (ref 19–32)
Calcium: 8.8 mg/dL (ref 8.4–10.5)
Chloride: 107 mEq/L (ref 96–112)
Creatinine, Ser: 1.22 mg/dL (ref 0.40–1.50)
GFR: 56.77 mL/min — ABNORMAL LOW (ref >60.00)
Glucose, Bld: 92 mg/dL (ref 70–99)
Potassium: 4.6 mEq/L (ref 3.5–5.1)
Sodium: 142 mEq/L (ref 135–145)
Total Bilirubin: 2.3 mg/dL — ABNORMAL HIGH (ref 0.2–1.2)
Total Protein: 6.1 g/dL (ref 6.0–8.3)

## 2022-11-13 LAB — CBC WITH DIFFERENTIAL/PLATELET
Basophils Absolute: 0 10*3/uL (ref 0.0–0.1)
Basophils Relative: 0.8 % (ref 0.0–3.0)
Eosinophils Absolute: 0.2 10*3/uL (ref 0.0–0.7)
Eosinophils Relative: 3.2 % (ref 0.0–5.0)
HCT: 42.7 % (ref 39.0–52.0)
Hemoglobin: 14.5 g/dL (ref 13.0–17.0)
Lymphocytes Relative: 17.5 % (ref 12.0–46.0)
Lymphs Abs: 1 10*3/uL (ref 0.7–4.0)
MCHC: 34 g/dL (ref 30.0–36.0)
MCV: 88 fl (ref 78.0–100.0)
Monocytes Absolute: 0.6 10*3/uL (ref 0.1–1.0)
Monocytes Relative: 10.2 % (ref 3.0–12.0)
Neutro Abs: 4 10*3/uL (ref 1.4–7.7)
Neutrophils Relative %: 68.3 % (ref 43.0–77.0)
Platelets: 132 10*3/uL — ABNORMAL LOW (ref 150.0–400.0)
RBC: 4.86 Mil/uL (ref 4.22–5.81)
RDW: 14.8 % (ref 11.5–15.5)
WBC: 5.8 10*3/uL (ref 4.0–10.5)

## 2022-11-13 LAB — PSA, MEDICARE: PSA: 3.06 ng/ml (ref 0.10–4.00)

## 2022-11-13 LAB — LIPID PANEL
Cholesterol: 133 mg/dL (ref 0–200)
HDL: 32.7 mg/dL — ABNORMAL LOW (ref >39.00)
LDL Cholesterol: 59 mg/dL (ref 0–99)
NonHDL: 100.04
Total CHOL/HDL Ratio: 4
Triglycerides: 207 mg/dL — ABNORMAL HIGH (ref 0.0–149.0)
VLDL: 41.4 mg/dL — ABNORMAL HIGH (ref 0.0–40.0)

## 2022-11-13 LAB — TSH: TSH: 4.81 u[IU]/mL (ref 0.35–5.50)

## 2022-11-13 MED ORDER — CARVEDILOL 3.125 MG PO TABS: 3.1250 mg | ORAL_TABLET | Freq: Two times a day (BID) | ORAL | 3 refills | Status: DC

## 2022-11-13 MED ORDER — FAMOTIDINE 20 MG PO TABS: 20.0000 mg | ORAL_TABLET | Freq: Every day | ORAL | Status: AC | PRN

## 2022-11-13 NOTE — Progress Notes (Unsigned)
In speech therapy after CVA.  He has outpatient cardiac monitor pending.  He had episode of elevated heart rate at home, per patient report.  He has noted memory changes since the event.  Discussed.  Hypertension:    Using medication without problems or lightheadedness: yes Chest pain with exertion:no Edema:no Short of breath:no Occ nonexertional chest discomfort.  Can happen a few times a week per patient report.  He was able to walk on vacation at the beach, walking 8 blocks w/o CP.  Has been walking at home w/o CP, up to 1 mile.  No vomiting, no blood in stool.  Mylanta helped an episode prev.    Elevated Cholesterol: Using medications without problems: yes Muscle aches: no Diet compliance: yes Exercise: yes, walking Labs pending.   Flu 2024 Shingles discussed with patient PNA discussed with patient Tetanus 2008, discussed with patient. COVID-vaccine discussed with patient. RSV d/w pt.   Colonoscopy 2015. Prostate cancer screening 2024 Advance directive-wife designated if patient were incapacitated.  Meds, vitals, and allergies reviewed.   PMH and SH reviewed  ROS: Per HPI unless specifically indicated in ROS section   GEN: nad, alert and pleasant in conversation HEENT: mucous membranes moist NECK: supple w/o LA CV: rrr. PULM: ctab, no inc wob ABD: soft, +bs EXT: trace R ankle edema SKIN: no acute rash  30 minutes were devoted to patient care in this encounter (this includes time spent reviewing the patient's file/history, interviewing and examining the patient, counseling/reviewing plan with patient).

## 2022-11-13 NOTE — Patient Instructions (Addendum)
Go to the lab on the way out.   If you have mychart we'll likely use that to update you.     Check your BP cuff at an appointment- take your cuff with you.    If you have more episodes of discomfort, then try taking Mylanta.  If that is happening frequently, then try adding on pepcid.    Take care.  Glad to see you.

## 2022-11-14 ENCOUNTER — Ambulatory Visit: Payer: PPO | Admitting: Physical Therapy

## 2022-11-14 ENCOUNTER — Ambulatory Visit: Payer: PPO | Admitting: Diagnostic Neuroimaging

## 2022-11-14 ENCOUNTER — Encounter: Payer: PPO | Admitting: Occupational Therapy

## 2022-11-14 ENCOUNTER — Ambulatory Visit: Payer: PPO | Admitting: Speech Pathology

## 2022-11-14 DIAGNOSIS — R41841 Cognitive communication deficit: Secondary | ICD-10-CM

## 2022-11-14 DIAGNOSIS — R0789 Other chest pain: Secondary | ICD-10-CM | POA: Insufficient documentation

## 2022-11-14 DIAGNOSIS — Z0000 Encounter for general adult medical examination without abnormal findings: Secondary | ICD-10-CM | POA: Insufficient documentation

## 2022-11-14 DIAGNOSIS — I619 Nontraumatic intracerebral hemorrhage, unspecified: Secondary | ICD-10-CM

## 2022-11-14 NOTE — Assessment & Plan Note (Signed)
Advance directive- wife designated if patient were incapacitated.

## 2022-11-14 NOTE — Assessment & Plan Note (Signed)
Statin intolerant.  Continue work on diet and exercise.  Continue Zetia and fish oil

## 2022-11-14 NOTE — Assessment & Plan Note (Signed)
Not exertional.  Improved with Mylanta.  Likely GERD related.  If having more episodes of discomfort, then try taking Mylanta.  If that is happening frequently, then try adding on pepcid.  Update me as needed.  He agrees to plan.

## 2022-11-14 NOTE — Assessment & Plan Note (Addendum)
Continue carvedilol Zetia isosorbide losartan fish oil.  Labs pending.

## 2022-11-14 NOTE — Therapy (Addendum)
OUTPATIENT SPEECH LANGUAGE PATHOLOGY  TREATMENT NOTE RE-CERTIFICATION REQUEST   Patient Name: Billy Kim MRN: [id number] DOB:06/23/1945, 77 y.o., male Today's Date: 11/14/2022  PCP: Crawford Givens, MD REFERRING PROVIDER: Claudette Laws, MD    End of Session - 11/14/22 1011     Visit Number 18    Number of Visits 41    Date for SLP Re-Evaluation 01/30/23    Authorization Type [employer]    Progress Note Due on Visit 20    SLP Start Time 0845    SLP Stop Time  0930    SLP Time Calculation (min) 45 min    Activity Tolerance Patient tolerated treatment well             No past medical history on file. The histories are not reviewed yet. Please review them in the "History" navigator section and refresh this SmartLink. Patient Active Problem List   Diagnosis Date Noted   Sinusitis 09/20/2022   Intraparenchymal hemorrhage of brain (HCC) 07/31/2022   AKI (acute kidney injury) (HCC) 07/27/2022   Hypertensive emergency 07/27/2022   Nontraumatic subcortical hemorrhage of left cerebral hemisphere (HCC) 07/25/2022   Weakness 07/20/2022   Leg swelling 07/20/2022   Dizziness 11/03/2021   Abscess 08/07/2021   Actinic keratosis 08/07/2021   Melanocytic nevi of trunk 08/07/2021   Nevus of back 08/07/2021   Personal history of other malignant neoplasm of skin 08/07/2021   Medicare annual wellness visit, subsequent 11/09/2020   Coronary artery disease of native artery of native heart with stable angina pectoris (HCC) 12/17/2019   Hyperlipidemia LDL goal <70 12/17/2019   Accelerating angina (HCC) 12/11/2019   Shingles 03/01/2019   FH: prostate cancer 02/21/2017   Advance care planning 03/31/2014   PSA elevation 03/27/2013   Thrombocytopenia, unspecified (HCC) 03/27/2013   Cough 03/12/2013   ED (erectile dysfunction) 12/28/2011   Neoplasm of uncertain behavior of skin 12/27/2010   Exertional chest pain 01/05/2010   Essential hypertension 04/08/2007    HYPERTRIGLYCERIDEMIA 01/08/2007   GILBERT'S SYNDROME 01/08/2007   ULCERATIVE COLITIS 01/08/2007    ONSET DATE: 07/25/2022   REFERRING DIAG: I61.9 (ICD-10-CM) - Intraparenchymal hemorrhage of brain (HCC)   THERAPY DIAG:  Cognitive communication deficit  Intraparenchymal hemorrhage of brain (HCC)  Rationale for Evaluation and Treatment Rehabilitation  SUBJECTIVE:   PERTINENT HISTORY and DIAGNOSTIC FINDINGS: Pt is a right handed 77 year old male with past medical history of HLD, HTN, CAD with recent intra parenchymal hemorrhage on 07/25/2022. MRI (07/26/2022) revealed "known acute hemorrhage in the left thalamus measuring up to 2 cm and chronic small vessel ischemia with chronic microhemorrhages."   PAIN:  Are you having pain? No   FALLS: Has patient fallen in last 6 months?  No  LIVING ENVIRONMENT: Lives with: lives with their spouse Lives in: House/apartment  PLOF:  Level of assistance: Independent with ADLs, Independent with IADLs Employment: Full-time employment   PATIENT GOALS   to get better  SUBJECTIVE STATEMENT: Pt continues to be eager, brought in complete HEP Pt accompanied by: wife  OBJECTIVE:   TODAY'S TREATMENT:  Skilled ST session focused on pt's cognitive communication goals. SLP facilitated the session by providing the following interventions:  Pt arrived with all numbers and contacts entered into his contact list. He was not able to recall how to locate contacts as SLP had shown him but he was able to locate his own way given more than a reasonable amount of time. Pt able to locate each entry for double checking with  100% accuracy in entering each. With maximal hierarchical cues and extended time, pt able to enter group text. Specifically, pt benefited from cues to sequence, organize and problem solve task.   PATIENT EDUCATION: Education details: see above Person educated: Patient, Wife  Education method: Explanation Education comprehension: needs  further education  HOME EXERCISE PROGRAM:        Written HEP provided for pt to enter this Clinical research associate contact information into contact list, text number of ushers he has for Sunday (text to be sent to this written on Friday) as well and names of ushers that he has participating on Sunday   GOALS:  Goals reviewed with patient? Yes  SHORT TERM GOALS: Target date: 10 sessions  Updated 10/04/2022 With Min A, patient will describe visual scenes using 3 or more sentences at 95% accuracy. Baseline: Goal status: INITIAL: MET - upgraded to With supervision A, patient will describe visual scenes using 3 or more sentences at 95% accuracy.   2.  Pt will report improved cognitive communication via PROM by 5 points at last ST session    Baseline:  Goal status: INITIAL; progress made  3.   With Min A, patient will recall functional verbal information 80% accuracy with use of compensations/strategies (repeats, rephrasing, notetaking, etc).   Baseline:  Goal status: INITIAL; MET; upgraded to  With supervision A, patient will recall functional verbal information 80% accuracy with use of compensations/strategies (repeats, rephrasing, notetaking, etc).   4.  With Min A, patient will generate sentences with 3 or more words in response to a situation at 95% accuracy in order to increase ability to communicate basic wants and needs.    Baseline:  Goal status: INITIAL: MET upgraded to With supervision A, patient will generate sentences with 3 or more words in response to a situation at 95% accuracy in order to increase ability to communicate basic wants and needs.   LONG TERM GOALS: Target date: 11/07/2022  With Supervision A, patient will participate in complex conversation at 90% accuracy to increase ability to communicate complex thoughts, feelings, and needs.  Baseline:  Goal status: INITIAL: ongoing  2.   With Supervision A, patient will demonstrate strategies for processing/recall (eg notetaking,  paraphrasing) successfully by recalling >90% of details from 15 minutes verbal information.   Baseline:  Goal status: INITIAL: ongoing  ASSESSMENT:  CLINICAL IMPRESSION: Patient is a 77 y.o. right handed male who was seen today for cognitive communication treatment session d/t recent left thalamic hemorrhage. Pt's wife was helpful in establishing some activities that pt is struggling with at home. Pt with good response to strategies introduced during today's session. Will request re-certification to continue addressing the above mentioned goals. See skilled treatment note for details.   OBJECTIVE IMPAIRMENTS include memory, executive functioning, expressive language, and aphasia. These impairments are limiting patient from return to work, managing medications, managing appointments, managing finances, household responsibilities, ADLs/IADLs, and effectively communicating at home and in community. Factors affecting potential to achieve goals and functional outcome are  N/A .Marland Kitchen Patient will benefit from skilled SLP services to address above impairments and improve overall function.  REHAB POTENTIAL: Excellent  PLAN: SLP FREQUENCY: 1-2x/week  SLP DURATION: 12 weeks  PLANNED INTERVENTIONS: Language facilitation, Cueing hierachy, Cognitive reorganization, Internal/external aids, Functional tasks, SLP instruction and feedback, Compensatory strategies, and Patient/family education   Teller Wakefield B. Dreama Saa, M.S., CCC-SLP, CBIS Speech-Language Pathologist Certified Brain Injury Specialist [facility]  [facility] [phone] Ascom [phone] Fax [phone]          ++

## 2022-11-14 NOTE — Addendum Note (Signed)
Addended by: Reuel Derby B on: 11/14/2022 01:03 PM   Modules accepted: Orders

## 2022-11-14 NOTE — Assessment & Plan Note (Signed)
History of, not on any antiplatelet or blood thinners at this point.  Continue carvedilol Zetia isosorbide losartan and fish oil.  Continue speech therapy.  He can update me as needed.  No new neurologic symptoms.

## 2022-11-14 NOTE — Assessment & Plan Note (Signed)
Flu 2024 Shingles discussed with patient PNA discussed with patient Tetanus 2008, discussed with patient. COVID-vaccine discussed with patient. RSV d/w pt.   Colonoscopy 2015. Prostate cancer screening 2024 Advance directive-wife designated if patient were incapacitated.

## 2022-11-19 ENCOUNTER — Ambulatory Visit: Payer: PPO | Admitting: Physical Therapy

## 2022-11-19 ENCOUNTER — Ambulatory Visit: Payer: PPO | Admitting: Speech Pathology

## 2022-11-19 ENCOUNTER — Encounter: Payer: PPO | Admitting: Occupational Therapy

## 2022-11-19 DIAGNOSIS — R41841 Cognitive communication deficit: Secondary | ICD-10-CM | POA: Diagnosis not present

## 2022-11-19 DIAGNOSIS — I619 Nontraumatic intracerebral hemorrhage, unspecified: Secondary | ICD-10-CM

## 2022-11-19 NOTE — Therapy (Signed)
OUTPATIENT SPEECH LANGUAGE PATHOLOGY  TREATMENT NOTE RE-CERTIFICATION REQUEST   Patient Name: Billy Kim MRN: [id number] DOB:1946/02/05, 77 y.o., male Today's Date: 11/19/2022  PCP: Crawford Givens, MD REFERRING PROVIDER: Claudette Laws, MD   End of Session - 11/19/22 1258     Visit Number 19    Number of Visits 41    Date for SLP Re-Evaluation 01/30/23    Authorization Type [employer]    Progress Note Due on Visit 20    SLP Start Time 1100    SLP Stop Time  1145    SLP Time Calculation (min) 45 min    Activity Tolerance Patient tolerated treatment well                  No past medical history on file. The histories are not reviewed yet. Please review them in the "History" navigator section and refresh this SmartLink. Patient Active Problem List   Diagnosis Date Noted   Healthcare maintenance 11/14/2022   Atypical chest pain 11/14/2022   Sinusitis 09/20/2022   Intraparenchymal hemorrhage of brain (HCC) 07/31/2022   AKI (acute kidney injury) (HCC) 07/27/2022   Hypertensive emergency 07/27/2022   Nontraumatic subcortical hemorrhage of left cerebral hemisphere (HCC) 07/25/2022   Weakness 07/20/2022   Leg swelling 07/20/2022   Dizziness 11/03/2021   Actinic keratosis 08/07/2021   Melanocytic nevi of trunk 08/07/2021   Nevus of back 08/07/2021   Personal history of other malignant neoplasm of skin 08/07/2021   Medicare annual wellness visit, subsequent 11/09/2020   Coronary artery disease of native artery of native heart with stable angina pectoris (HCC) 12/17/2019   Hyperlipidemia LDL goal <70 12/17/2019   Accelerating angina (HCC) 12/11/2019   Shingles 03/01/2019   FH: prostate cancer 02/21/2017   Advance care planning 03/31/2014   PSA elevation 03/27/2013   Thrombocytopenia, unspecified (HCC) 03/27/2013   Cough 03/12/2013   ED (erectile dysfunction) 12/28/2011   Neoplasm of uncertain behavior of skin 12/27/2010   Exertional chest pain  01/05/2010   Essential hypertension 04/08/2007   HYPERTRIGLYCERIDEMIA 01/08/2007   GILBERT'S SYNDROME 01/08/2007   ULCERATIVE COLITIS 01/08/2007    ONSET DATE: 07/25/2022   REFERRING DIAG: I61.9 (ICD-10-CM) - Intraparenchymal hemorrhage of brain (HCC)   THERAPY DIAG:  Cognitive communication deficit  Intraparenchymal hemorrhage of brain (HCC)  Rationale for Evaluation and Treatment Rehabilitation  SUBJECTIVE:   PERTINENT HISTORY and DIAGNOSTIC FINDINGS: Pt is a right handed 77 year old male with past medical history of HLD, HTN, CAD with recent intra parenchymal hemorrhage on 07/25/2022. MRI (07/26/2022) revealed "known acute hemorrhage in the left thalamus measuring up to 2 cm and chronic small vessel ischemia with chronic microhemorrhages."   PAIN:  Are you having pain? No   FALLS: Has patient fallen in last 6 months?  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 SLP facilitated the session by providing the following interventions:  SLP introduced memory book/daily planner concept with extensive time spent creating example of potential day as well as verbal and written instructions. Also created plan for pt to foster task  initiation and recall of events including driving per MD order.   PATIENT EDUCATION: Education details: see above Person educated: Patient, Wife  Education method: Explanation Education comprehension: needs further education  HOME EXERCISE PROGRAM:        Organize pill organizer, complete daily  schedule/memory book every evening, schedule    Audiology assessment  GOALS:  Goals reviewed with patient? Yes  SHORT TERM GOALS: Target date: 10 sessions  Updated 10/04/2022 With Min A, patient will describe visual scenes using 3 or more sentences at 95% accuracy. Baseline: Goal status: INITIAL: MET - upgraded to With supervision A, patient will describe visual scenes using 3 or more sentences at 95% accuracy.   2.  Pt will report improved cognitive communication via PROM by 5 points at last ST session    Baseline:  Goal status: INITIAL; progress made  3.   With Min A, patient will recall functional verbal information 80% accuracy with use of compensations/strategies (repeats, rephrasing, notetaking, etc).   Baseline:  Goal status: INITIAL; MET; upgraded to  With supervision A, patient will recall functional verbal information 80% accuracy with use of compensations/strategies (repeats, rephrasing, notetaking, etc).   4.  With Min A, patient will generate sentences with 3 or more words in response to a situation at 95% accuracy in order to increase ability to communicate basic wants and needs.    Baseline:  Goal status: INITIAL: MET upgraded to With supervision A, patient will generate sentences with 3 or more words in response to a situation at 95% accuracy in order to increase ability to communicate basic wants and needs.   LONG TERM GOALS: Target date: 11/07/2022  With Supervision A, patient will participate in complex conversation at 90% accuracy to increase ability to communicate complex thoughts, feelings, and needs.  Baseline:  Goal status: INITIAL: ongoing  2.   With Supervision A, patient will demonstrate strategies for processing/recall (eg notetaking, paraphrasing) successfully by recalling >90% of details from 15 minutes verbal information.   Baseline:  Goal status: INITIAL: ongoing  ASSESSMENT:  CLINICAL IMPRESSION: Patient is a 77 y.o. right handed male who was  seen today for cognitive communication treatment session d/t recent left thalamic hemorrhage. While pt is eager, he continues present with decreased task initiation and deficits in recall.  See skilled treatment note for details on skilled ways to target these deficits.  OBJECTIVE IMPAIRMENTS include memory, executive functioning, expressive language, and aphasia. These impairments are limiting patient from return to work, managing medications, managing appointments, managing finances, household responsibilities, ADLs/IADLs, and effectively communicating at home and in community. Factors affecting potential to achieve goals and functional outcome are  N/A .Marland Kitchen Patient will benefit from skilled SLP services to address above impairments and improve overall function.  REHAB POTENTIAL: Excellent  PLAN: SLP FREQUENCY: 1-2x/week  SLP DURATION: 12 weeks  PLANNED INTERVENTIONS: Language facilitation, Cueing hierachy, Cognitive reorganization, Internal/external aids, Functional tasks, SLP instruction and feedback, Compensatory strategies, and Patient/family education   Terrin Meddaugh B. Dreama Saa, M.S., CCC-SLP, CBIS Speech-Language Pathologist Certified Brain Injury Specialist [facility]  [facility] [phone] Ascom [phone] Fax [phone]          ++

## 2022-11-21 ENCOUNTER — Encounter: Payer: PPO | Admitting: Occupational Therapy

## 2022-11-21 ENCOUNTER — Ambulatory Visit: Payer: PPO | Attending: Physician Assistant | Admitting: Speech Pathology

## 2022-11-21 ENCOUNTER — Ambulatory Visit: Payer: PPO | Admitting: Physical Therapy

## 2022-11-21 DIAGNOSIS — R41841 Cognitive communication deficit: Secondary | ICD-10-CM | POA: Diagnosis not present

## 2022-11-21 DIAGNOSIS — I619 Nontraumatic intracerebral hemorrhage, unspecified: Secondary | ICD-10-CM | POA: Insufficient documentation

## 2022-11-21 NOTE — Therapy (Signed)
OUTPATIENT SPEECH LANGUAGE PATHOLOGY  TREATMENT NOTE  Patient Name: Billy Kim MRN: [id number] DOB:[birth year], 77 y.o., male Today's Date: 11/21/2022  PCP: Crawford Givens, MD REFERRING PROVIDER: Claudette Laws, MD  Speech Therapy Progress Note  Dates of Reporting Period: 10/08/2022 to 11/21/2022  Objective: Patient has been seen for 10 speech therapy sessions this reporting period targeting his mild to moderate cognitive communication impairment. While pt and his wife eagerly participate and complete all HEP, he is making slower than anticipated progress towards his goals and as a result, he has not met any STG or LTGs this reporting period. Suspect some baseline memory loss are impacting recovery.     End of Session - 11/21/22 1245     Visit Number 20    Number of Visits 41    Date for SLP Re-Evaluation 01/30/23    Authorization Type [employer]    Progress Note Due on Visit 20    SLP Start Time 0930    SLP Stop Time  1015    SLP Time Calculation (min) 45 min    Activity Tolerance Patient tolerated treatment well                  No past medical history on file. The histories are not reviewed yet. Please review them in the "History" navigator section and refresh this SmartLink. Patient Active Problem List   Diagnosis Date Noted   Healthcare maintenance 11/14/2022   Atypical chest pain 11/14/2022   Sinusitis 09/20/2022   Intraparenchymal hemorrhage of brain (HCC) 07/31/2022   AKI (acute kidney injury) (HCC) 07/27/2022   Hypertensive emergency 07/27/2022   Nontraumatic subcortical hemorrhage of left cerebral hemisphere (HCC) 07/25/2022   Weakness 07/20/2022   Leg swelling 07/20/2022   Dizziness 11/03/2021   Actinic keratosis 08/07/2021   Melanocytic nevi of trunk 08/07/2021   Nevus of back 08/07/2021   Personal history of other malignant neoplasm of skin 08/07/2021   Medicare annual wellness visit, subsequent 11/09/2020   Coronary artery disease of  native artery of native heart with stable angina pectoris (HCC) 12/17/2019   Hyperlipidemia LDL goal <70 12/17/2019   Accelerating angina (HCC) 12/11/2019   Shingles 03/01/2019   FH: prostate cancer 02/21/2017   Advance care planning 03/31/2014   PSA elevation 03/27/2013   Thrombocytopenia, unspecified (HCC) 03/27/2013   Cough 03/12/2013   ED (erectile dysfunction) 12/28/2011   Neoplasm of uncertain behavior of skin 12/27/2010   Exertional chest pain 01/05/2010   Essential hypertension 04/08/2007   HYPERTRIGLYCERIDEMIA 01/08/2007   Disorder of bilirubin excretion 01/08/2007   Ulcerative colitis (HCC) 01/08/2007    ONSET DATE: 07/25/2022   REFERRING DIAG: I61.9 (ICD-10-CM) - Intraparenchymal hemorrhage of brain (HCC)   THERAPY DIAG:  Cognitive communication deficit  Intraparenchymal hemorrhage of brain (HCC)  Rationale for Evaluation and Treatment Rehabilitation  SUBJECTIVE:   PERTINENT HISTORY and DIAGNOSTIC FINDINGS: Pt is a right handed 77 year old male with past medical history of HLD, HTN, CAD with recent intra parenchymal hemorrhage on 07/25/2022. MRI (07/26/2022) revealed "known acute hemorrhage in the left thalamus measuring up to 2 cm and chronic small vessel ischemia with chronic microhemorrhages."   PAIN:  Are you having pain? No   FALLS: Has patient fallen in last 6 months?  No  LIVING ENVIRONMENT: Lives with: lives with their spouse Lives in: House/apartment  PLOF:  Level of assistance: Independent with ADLs, Independent with IADLs Employment: Full-time employment   PATIENT GOALS   to get better  SUBJECTIVE STATEMENT: Pt  arrived eager, reports that he had a fall evening of September 30th - he was taking out the trash while attempting to hold the umbrella during heavy rain - this was also a new task for pt Pt accompanied by: wife  OBJECTIVE:   TODAY'S TREATMENT:  Skilled ST session focused on pt's cognitive communication goals. SLP facilitated the  session by providing the following interventions:  Pt brought in [facility], paragraph written about not having any cold water in bathroom, pt reset breaker, wife accompanied him back to bathroom and realized that he was turning on cold water entire time  Pt also had fall evening on Monday Sept 30th during pouring rain onto concrete, was attempting to take large trashcan  to end of driveway - reports that he didn't hit his head  They also report that pt organize his pill organizer independently with his wife double checking  Semi-complex problem solving, recall and task sequence/organization as well as language production was targeted thru creation of 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 Pt able to recall sequence of task independently in 2 out of 5 opportunities improving with moderate faded to minimal cues. Pt self-corrected x 2 through task that consisted of composing 2 group messages   PATIENT EDUCATION: Education details: see above Person educated: Patient, Wife  Education method: Explanation Education comprehension: needs further education  HOME EXERCISE PROGRAM:        Complete daily schedule  Sent text to this writer on Friday  GOALS:  Goals reviewed with patient? Yes  SHORT TERM GOALS: Target date: 10 sessions  Updated 10/04/2022  Updated 11/21/2022   2.  Pt will report improved cognitive communication via PROM by 5 points at last ST session    Baseline:  Goal status: INITIAL; progress made  3.   With Min A, patient will recall functional verbal information 80% accuracy with use of compensations/strategies (repeats, rephrasing, notetaking, etc).   Baseline:  Goal status: INITIAL; MET; upgraded to  With supervision A, patient will recall functional verbal information 80% accuracy with use of compensations/strategies (repeats, rephrasing, notetaking, etc): progress made - newly introduced compensatory aid  4.  With Min A, patient will generate sentences  with 3 or more words in response to a situation at 95% accuracy in order to increase ability to communicate basic wants and needs.    Baseline:  Goal status: INITIAL: MET upgraded to With supervision A, patient will generate sentences with 3 or more words in response to a situation at 95% accuracy in order to increase ability to communicate basic wants and needs.: while progress has been made, pt requires more than a reasonable amount of time  LONG TERM GOALS: Target date: 01/30/2023  Updated: 11/21/2022 With Supervision A, patient will participate in complex conversation at 90% accuracy to increase ability to communicate complex thoughts, feelings, and needs.  Baseline:  Goal status: INITIAL: ongoing: pt continues with delayed response times  2.   With Supervision A, patient will demonstrate strategies for processing/recall (eg notetaking, paraphrasing) successfully by recalling >90% of details from 15 minutes verbal information.   Baseline:  Goal status: INITIAL: ongoing: progress made, newly introduce compensatory aid  ASSESSMENT:  CLINICAL IMPRESSION: Patient is a 77 y.o. right handed male who was seen today for cognitive communication treatment session d/t recent left thalamic hemorrhage. While pt is eager, he continues present with decreased task initiation and deficits in recall requiring moderate assistance for all therapy tasks.  See skilled treatment note  for details on skilled ways to target these deficits.  OBJECTIVE IMPAIRMENTS include memory, executive functioning, expressive language, and aphasia. These impairments are limiting patient from return to work, managing medications, managing appointments, managing finances, household responsibilities, ADLs/IADLs, and effectively communicating at home and in community. Factors affecting potential to achieve goals and functional outcome are  N/A .Marland Kitchen Patient will benefit from skilled SLP services to address above impairments and improve  overall function.  REHAB POTENTIAL: Excellent  PLAN: SLP FREQUENCY: 1-2x/week  SLP DURATION: 12 weeks  PLANNED INTERVENTIONS: Language facilitation, Cueing hierachy, Cognitive reorganization, Internal/external aids, Functional tasks, SLP instruction and feedback, Compensatory strategies, and Patient/family education   Azalynn Maxim B. Dreama Saa, M.S., CCC-SLP, CBIS Speech-Language Pathologist Certified Brain Injury Specialist [facility]  [facility] [phone] Ascom [phone] Fax [phone]          ++

## 2022-11-26 ENCOUNTER — Encounter: Payer: PPO | Admitting: Occupational Therapy

## 2022-11-26 ENCOUNTER — Ambulatory Visit: Payer: PPO | Admitting: Speech Pathology

## 2022-11-26 ENCOUNTER — Ambulatory Visit: Payer: PPO | Admitting: Physical Therapy

## 2022-11-26 DIAGNOSIS — I619 Nontraumatic intracerebral hemorrhage, unspecified: Secondary | ICD-10-CM

## 2022-11-26 DIAGNOSIS — R41841 Cognitive communication deficit: Secondary | ICD-10-CM

## 2022-11-26 NOTE — Therapy (Signed)
OUTPATIENT SPEECH LANGUAGE PATHOLOGY  TREATMENT NOTE  Patient Name: Billy Kim MRN: [id number] DOB:1945-10-18, 77 y.o., male Today's Date: 11/26/2022  PCP: Crawford Givens, MD REFERRING PROVIDER: Claudette Laws, MD     End of Session - 11/26/22 0928     Visit Number 21    Number of Visits 41    Date for SLP Re-Evaluation 01/30/23    Authorization Type [employer]    Progress Note Due on Visit 30    SLP Start Time 0930    SLP Stop Time  1015    SLP Time Calculation (min) 45 min    Activity Tolerance Patient tolerated treatment well            No past medical history on file. The histories are not reviewed yet. Please review them in the "History" navigator section and refresh this SmartLink. Patient Active Problem List   Diagnosis Date Noted   Healthcare maintenance 11/14/2022   Atypical chest pain 11/14/2022   Sinusitis 09/20/2022   Intraparenchymal hemorrhage of brain (HCC) 07/31/2022   AKI (acute kidney injury) (HCC) 07/27/2022   Hypertensive emergency 07/27/2022   Nontraumatic subcortical hemorrhage of left cerebral hemisphere (HCC) 07/25/2022   Weakness 07/20/2022   Leg swelling 07/20/2022   Dizziness 11/03/2021   Actinic keratosis 08/07/2021   Melanocytic nevi of trunk 08/07/2021   Nevus of back 08/07/2021   Personal history of other malignant neoplasm of skin 08/07/2021   Medicare annual wellness visit, subsequent 11/09/2020   Coronary artery disease of native artery of native heart with stable angina pectoris (HCC) 12/17/2019   Hyperlipidemia LDL goal <70 12/17/2019   Accelerating angina (HCC) 12/11/2019   Shingles 03/01/2019   FH: prostate cancer 02/21/2017   Advance care planning 03/31/2014   PSA elevation 03/27/2013   Thrombocytopenia, unspecified (HCC) 03/27/2013   Cough 03/12/2013   ED (erectile dysfunction) 12/28/2011   Neoplasm of uncertain behavior of skin 12/27/2010   Exertional chest pain 01/05/2010   Essential hypertension  04/08/2007   HYPERTRIGLYCERIDEMIA 01/08/2007   Disorder of bilirubin excretion 01/08/2007   Ulcerative colitis (HCC) 01/08/2007    ONSET DATE: 07/25/2022   REFERRING DIAG: I61.9 (ICD-10-CM) - Intraparenchymal hemorrhage of brain (HCC)   THERAPY DIAG:  Cognitive communication deficit  Intraparenchymal hemorrhage of brain (HCC)  Rationale for Evaluation and Treatment Rehabilitation  SUBJECTIVE:   PERTINENT HISTORY and DIAGNOSTIC FINDINGS: Pt is a right handed 77 year old male with past medical history of HLD, HTN, CAD with recent intra parenchymal hemorrhage on 07/25/2022. MRI (07/26/2022) revealed "known acute hemorrhage in the left thalamus measuring up to 2 cm and chronic small vessel ischemia with chronic microhemorrhages."   PAIN:  Are you having pain? No   FALLS: Has patient fallen in last 6 months?  No  LIVING ENVIRONMENT: Lives with: lives with their spouse Lives in: House/apartment  PLOF:  Level of assistance: Independent with ADLs, Independent with IADLs Employment: Full-time employment   PATIENT GOALS   to get better  SUBJECTIVE STATEMENT: Pt complete HEP, "he is getting a little better in all aspects of daily life" Pt accompanied by: wife  OBJECTIVE:   TODAY'S TREATMENT:  Skilled ST session focused on pt's cognitive communication goals. 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 His wife also states  that "he was much quicker at work."   Continued education provided on benefits and areas of cognition targeted by utilizing daily/schedule/memorybook.   Pt had questions regarding stroke. Time spent providing skilled education on nature of stroke, recovery.    PATIENT EDUCATION: Education details: see above Person  educated: Patient, Wife  Education method: Explanation Education comprehension: needs further education  HOME EXERCISE PROGRAM:        Complete daily schedule  Sent text to this Clinical research associate on Friday  GOALS:  Goals reviewed with patient? Yes  SHORT TERM GOALS: Target date: 10 sessions  Updated 10/04/2022  Updated 11/21/2022   2.  Pt will report improved cognitive communication via PROM by 5 points at last ST session    Baseline:  Goal status: INITIAL; progress made  3.   With Min A, patient will recall functional verbal information 80% accuracy with use of compensations/strategies (repeats, rephrasing, notetaking, etc).   Baseline:  Goal status: INITIAL; MET; upgraded to  With supervision A, patient will recall functional verbal information 80% accuracy with use of compensations/strategies (repeats, rephrasing, notetaking, etc): progress made - newly introduced compensatory aid  4.  With Min A, patient will generate sentences with 3 or more words in response to a situation at 95% accuracy in order to increase ability to communicate basic wants and needs.    Baseline:  Goal status: INITIAL: MET upgraded to With supervision A, patient will generate sentences with 3 or more words in response to a situation at 95% accuracy in order to increase ability to communicate basic wants and needs.: while progress has been made, pt requires more than a reasonable amount of time  LONG TERM GOALS: Target date: 01/30/2023  Updated: 11/21/2022 With Supervision A, patient will participate in complex conversation at 90% accuracy to increase ability to communicate complex thoughts, feelings, and needs.  Baseline:  Goal status: INITIAL: ongoing: pt continues with delayed response times  2.   With Supervision A, patient will demonstrate strategies for processing/recall (eg notetaking, paraphrasing) successfully by recalling >90% of details from 15 minutes verbal information.   Baseline:  Goal status:  INITIAL: ongoing: progress made, newly introduce compensatory aid  ASSESSMENT:  CLINICAL IMPRESSION: Patient is a 77 y.o. right handed male who was seen today for cognitive communication treatment session d/t recent left thalamic hemorrhage. Pt and his wife report improvement and increase activities.   See skilled treatment note for details on skilled ways to target these deficits.  OBJECTIVE IMPAIRMENTS include memory, executive functioning, expressive language, and aphasia. These impairments are limiting patient from return to work, managing medications, managing appointments, managing finances, household responsibilities, ADLs/IADLs, and effectively communicating at home and in community. Factors affecting potential to achieve goals and functional outcome are  N/A .Marland Kitchen Patient will benefit from skilled SLP services to address above impairments and improve overall function.  REHAB POTENTIAL: Excellent  PLAN: SLP FREQUENCY: 1-2x/week  SLP DURATION: 12 weeks  PLANNED INTERVENTIONS: Language facilitation, Cueing hierachy, Cognitive reorganization, Internal/external aids, Functional tasks, SLP instruction and feedback, Compensatory strategies, and Patient/family education   Mariel Lukins B. Dreama Saa, M.S., CCC-SLP, CBIS Speech-Language Pathologist Certified Brain Injury Specialist [facility]  [facility] [phone] Ascom [phone] Fax [phone]          ++

## 2022-11-28 ENCOUNTER — Ambulatory Visit: Payer: PPO | Admitting: Physical Therapy

## 2022-11-28 ENCOUNTER — Ambulatory Visit: Payer: PPO | Admitting: Speech Pathology

## 2022-11-28 ENCOUNTER — Encounter: Payer: PPO | Admitting: Occupational Therapy

## 2022-11-28 DIAGNOSIS — R41841 Cognitive communication deficit: Secondary | ICD-10-CM

## 2022-11-28 DIAGNOSIS — I619 Nontraumatic intracerebral hemorrhage, unspecified: Secondary | ICD-10-CM

## 2022-11-28 NOTE — Therapy (Signed)
OUTPATIENT SPEECH LANGUAGE PATHOLOGY  TREATMENT NOTE  Patient Name: Billy Kim MRN: [id number] DOB:1945-03-29, 77 y.o., male Today's Date: 11/28/2022  PCP: Crawford Givens, MD REFERRING PROVIDER: Claudette Laws, MD     End of Session - 11/28/22 0949     Visit Number 22    Number of Visits 41    Date for SLP Re-Evaluation 01/30/23    Authorization Type [employer]    Progress Note Due on Visit 30    SLP Start Time 0930    SLP Stop Time  1015    SLP Time Calculation (min) 45 min    Activity Tolerance Patient tolerated treatment well            No past medical history on file. The histories are not reviewed yet. Please review them in the "History" navigator section and refresh this SmartLink. Patient Active Problem List   Diagnosis Date Noted   Healthcare maintenance 11/14/2022   Atypical chest pain 11/14/2022   Sinusitis 09/20/2022   Intraparenchymal hemorrhage of brain (HCC) 07/31/2022   AKI (acute kidney injury) (HCC) 07/27/2022   Hypertensive emergency 07/27/2022   Nontraumatic subcortical hemorrhage of left cerebral hemisphere (HCC) 07/25/2022   Weakness 07/20/2022   Leg swelling 07/20/2022   Dizziness 11/03/2021   Actinic keratosis 08/07/2021   Melanocytic nevi of trunk 08/07/2021   Nevus of back 08/07/2021   Personal history of other malignant neoplasm of skin 08/07/2021   Medicare annual wellness visit, subsequent 11/09/2020   Coronary artery disease of native artery of native heart with stable angina pectoris (HCC) 12/17/2019   Hyperlipidemia LDL goal <70 12/17/2019   Accelerating angina (HCC) 12/11/2019   Shingles 03/01/2019   FH: prostate cancer 02/21/2017   Advance care planning 03/31/2014   PSA elevation 03/27/2013   Thrombocytopenia, unspecified (HCC) 03/27/2013   Cough 03/12/2013   ED (erectile dysfunction) 12/28/2011   Neoplasm of uncertain behavior of skin 12/27/2010   Exertional chest pain 01/05/2010   Essential hypertension  04/08/2007   HYPERTRIGLYCERIDEMIA 01/08/2007   Disorder of bilirubin excretion 01/08/2007   Ulcerative colitis (HCC) 01/08/2007    ONSET DATE: 07/25/2022   REFERRING DIAG: I61.9 (ICD-10-CM) - Intraparenchymal hemorrhage of brain (HCC)   THERAPY DIAG:  Cognitive communication deficit  Intraparenchymal hemorrhage of brain (HCC)  Rationale for Evaluation and Treatment Rehabilitation  SUBJECTIVE:   PERTINENT HISTORY and DIAGNOSTIC FINDINGS: Pt is a right handed 77 year old male with past medical history of HLD, HTN, CAD with recent intra parenchymal hemorrhage on 07/25/2022. MRI (07/26/2022) revealed "known acute hemorrhage in the left thalamus measuring up to 2 cm and chronic small vessel ischemia with chronic microhemorrhages."   PAIN:  Are you having pain? No   FALLS: Has patient fallen in last 6 months?  No  LIVING ENVIRONMENT: Lives with: lives with their spouse Lives in: House/apartment  PLOF:  Level of assistance: Independent with ADLs, Independent with IADLs Employment: Full-time employment   PATIENT GOALS   to get better  SUBJECTIVE STATEMENT: "I am slow this morning" Pt accompanied by: wife  OBJECTIVE:   TODAY'S TREATMENT:  Skilled ST session focused on pt's cognitive communication goals. SLP facilitated the session by providing the following interventions:  Pt arrived with memory book completed and had upcoming events on appropriate dates.   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 With maximal verbal instruction, pt able to write down basic steps. Pt required more than a reasonable amount of time to  type message but had great self-monitoring and self-correcting ability.   Pt also mentioned that he had several projects around the house to complete. SLP provided instruction on making a list of supplies as well as having a written organizational plan prior to starting the project. He and his wife voiced  understanding.    PATIENT EDUCATION: Education details: see above Person educated: Patient, Wife  Education method: Explanation Education comprehension: needs further education  HOME EXERCISE PROGRAM:        Complete daily schedule  Sent text to this Clinical research associate on Friday  GOALS:  Goals reviewed with patient? Yes  SHORT TERM GOALS: Target date: 10 sessions  Updated 10/04/2022  Updated 11/21/2022   2.  Pt will report improved cognitive communication via PROM by 5 points at last ST session    Baseline:  Goal status: INITIAL; progress made  3.   With Min A, patient will recall functional verbal information 80% accuracy with use of compensations/strategies (repeats, rephrasing, notetaking, etc).   Baseline:  Goal status: INITIAL; MET; upgraded to  With supervision A, patient will recall functional verbal information 80% accuracy with use of compensations/strategies (repeats, rephrasing, notetaking, etc): progress made - newly introduced compensatory aid  4.  With Min A, patient will generate sentences with 3 or more words in response to a situation at 95% accuracy in order to increase ability to communicate basic wants and needs.    Baseline:  Goal status: INITIAL: MET upgraded to With supervision A, patient will generate sentences with 3 or more words in response to a situation at 95% accuracy in order to increase ability to communicate basic wants and needs.: while progress has been made, pt requires more than a reasonable amount of time  LONG TERM GOALS: Target date: 01/30/2023  Updated: 11/21/2022 With Supervision A, patient will participate in complex conversation at 90% accuracy to increase ability to communicate complex thoughts, feelings, and needs.  Baseline:  Goal status: INITIAL: ongoing: pt continues with delayed response times  2.   With Supervision A, patient will demonstrate strategies for processing/recall (eg notetaking, paraphrasing) successfully by recalling >90% of  details from 15 minutes verbal information.   Baseline:  Goal status: INITIAL: ongoing: progress made, newly introduce compensatory aid  ASSESSMENT:  CLINICAL IMPRESSION: Patient is a 77 y.o. right handed male who was seen today for cognitive communication treatment session d/t recent left thalamic hemorrhage. Pt and his wife report improvement and increase activities.   See skilled treatment note for details on skilled ways to target these deficits.  OBJECTIVE IMPAIRMENTS include memory, executive functioning, expressive language, and aphasia. These impairments are limiting patient from return to work, managing medications, managing appointments, managing finances, household responsibilities, ADLs/IADLs, and effectively communicating at home and in community. Factors affecting potential to achieve goals and functional outcome are  N/A .Marland Kitchen Patient will benefit from skilled SLP services to address above impairments and improve overall function.  REHAB POTENTIAL: Excellent  PLAN: SLP FREQUENCY: 1-2x/week  SLP DURATION: 12 weeks  PLANNED INTERVENTIONS: Language facilitation, Cueing hierachy, Cognitive reorganization, Internal/external aids, Functional tasks, SLP instruction and feedback, Compensatory strategies, and Patient/family education   Arriyana Rodell B. Dreama Saa, M.S., CCC-SLP, CBIS Speech-Language Pathologist Certified Brain Injury Specialist [facility]  [facility] [phone] Ascom [phone] Fax [phone]          ++

## 2022-12-03 ENCOUNTER — Encounter: Payer: PPO | Admitting: Occupational Therapy

## 2022-12-03 ENCOUNTER — Ambulatory Visit: Payer: PPO | Admitting: Physical Therapy

## 2022-12-03 ENCOUNTER — Ambulatory Visit: Payer: PPO | Admitting: Speech Pathology

## 2022-12-03 DIAGNOSIS — R41841 Cognitive communication deficit: Secondary | ICD-10-CM

## 2022-12-03 DIAGNOSIS — I619 Nontraumatic intracerebral hemorrhage, unspecified: Secondary | ICD-10-CM

## 2022-12-03 NOTE — Therapy (Signed)
OUTPATIENT SPEECH LANGUAGE PATHOLOGY  TREATMENT NOTE  Patient Name: Billy Kim MRN: [id number] DOB:[birth year], 77 y.o., male Today's Date: 12/03/2022  PCP: Crawford Givens, MD REFERRING PROVIDER: Claudette Laws, MD     End of Session - 12/03/22 1104     Visit Number 23    Number of Visits 41    Date for SLP Re-Evaluation 01/30/23    Authorization Type [employer]    Progress Note Due on Visit 30    SLP Start Time 1100    SLP Stop Time  1145    SLP Time Calculation (min) 45 min    Activity Tolerance Patient tolerated treatment well            No past medical history on file. The histories are not reviewed yet. Please review them in the "History" navigator section and refresh this SmartLink. Patient Active Problem List   Diagnosis Date Noted   Healthcare maintenance 11/14/2022   Atypical chest pain 11/14/2022   Sinusitis 09/20/2022   Intraparenchymal hemorrhage of brain (HCC) 07/31/2022   AKI (acute kidney injury) (HCC) 07/27/2022   Hypertensive emergency 07/27/2022   Nontraumatic subcortical hemorrhage of left cerebral hemisphere (HCC) 07/25/2022   Weakness 07/20/2022   Leg swelling 07/20/2022   Dizziness 11/03/2021   Actinic keratosis 08/07/2021   Melanocytic nevi of trunk 08/07/2021   Nevus of back 08/07/2021   Personal history of other malignant neoplasm of skin 08/07/2021   Medicare annual wellness visit, subsequent 11/09/2020   Coronary artery disease of native artery of native heart with stable angina pectoris (HCC) 12/17/2019   Hyperlipidemia LDL goal <70 12/17/2019   Accelerating angina (HCC) 12/11/2019   Shingles 03/01/2019   FH: prostate cancer 02/21/2017   Advance care planning 03/31/2014   PSA elevation 03/27/2013   Thrombocytopenia, unspecified (HCC) 03/27/2013   Cough 03/12/2013   ED (erectile dysfunction) 12/28/2011   Neoplasm of uncertain behavior of skin 12/27/2010   Exertional chest pain 01/05/2010   Essential hypertension  04/08/2007   HYPERTRIGLYCERIDEMIA 01/08/2007   Disorder of bilirubin excretion 01/08/2007   Ulcerative colitis (HCC) 01/08/2007    ONSET DATE: 07/25/2022   REFERRING DIAG: I61.9 (ICD-10-CM) - Intraparenchymal hemorrhage of brain (HCC)   THERAPY DIAG:  Cognitive communication deficit  Intraparenchymal hemorrhage of brain (HCC)  Rationale for Evaluation and Treatment Rehabilitation  SUBJECTIVE:   PERTINENT HISTORY and DIAGNOSTIC FINDINGS: Pt is a right handed 77 year old male with past medical history of HLD, HTN, CAD with recent intra parenchymal hemorrhage on 07/25/2022. MRI (07/26/2022) revealed "known acute hemorrhage in the left thalamus measuring up to 2 cm and chronic small vessel ischemia with chronic microhemorrhages."   PAIN:  Are you having pain? No   FALLS: Has patient fallen in last 6 months?  No  LIVING ENVIRONMENT: Lives with: lives with their spouse Lives in: House/apartment  PLOF:  Level of assistance: Independent with ADLs, Independent with IADLs Employment: Full-time employment   PATIENT GOALS   to get better  SUBJECTIVE STATEMENT: Pt reports working with his son Pt accompanied by: wife  OBJECTIVE:   TODAY'S TREATMENT:  Skilled ST session focused on pt's cognitive communication goals. 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 Pt with improved ability to write down the instructions as well as complete the instructions. Pt also observed with increased speed when  completing. After going thru sequence once, pt able to follow sequence and written notes to add a second contact with rare supervision cues.   He also worked "around [employer] with great success. He and his wife report pt completing several projects well. He continues to state that it takes him longer but he is successful.   PATIENT EDUCATION: Education details: see above Person educated: Patient, Wife  Education method: Explanation Education comprehension: needs further education  HOME EXERCISE PROGRAM:        Complete daily schedule  Sent text to this Clinical research associate on Friday  GOALS:  Goals reviewed with patient? Yes  SHORT TERM GOALS: Target date: 10 sessions  Updated 10/04/2022  Updated 11/21/2022   2.  Pt will report improved cognitive communication via PROM by 5 points at last ST session    Baseline:  Goal status: INITIAL; progress made  3.   With Min A, patient will recall functional verbal information 80% accuracy with use of compensations/strategies (repeats, rephrasing, notetaking, etc).   Baseline:  Goal status: INITIAL; MET; upgraded to  With supervision A, patient will recall functional verbal information 80% accuracy with use of compensations/strategies (repeats, rephrasing, notetaking, etc): progress made - newly introduced compensatory aid  4.  With Min A, patient will generate sentences with 3 or more words in response to a situation at 95% accuracy in order to increase ability to communicate basic wants and needs.    Baseline:  Goal status: INITIAL: MET upgraded to With supervision A, patient will generate sentences with 3 or more words in response to a situation at 95% accuracy in order to increase ability to communicate basic wants and needs.: while progress has been made, pt requires more than a reasonable amount of time  LONG TERM GOALS: Target date: 01/30/2023  Updated: 11/21/2022 With Supervision A, patient will participate in complex conversation at 90% accuracy  to increase ability to communicate complex thoughts, feelings, and needs.  Baseline:  Goal status: INITIAL: ongoing: pt continues with delayed response times  2.   With Supervision A, patient will demonstrate strategies for processing/recall (eg notetaking, paraphrasing) successfully by recalling >90% of details from 15 minutes verbal information.   Baseline:  Goal status: INITIAL: ongoing: progress made, newly introduce compensatory aid  ASSESSMENT:  CLINICAL IMPRESSION: Patient is a 77 y.o. right handed male who was seen today for cognitive communication treatment session d/t recent left thalamic hemorrhage. Pt and his wife report improvement and increase activities.   See skilled treatment note for details on skilled ways to target these deficits.  OBJECTIVE IMPAIRMENTS include memory, executive functioning, expressive language, and aphasia. These impairments are limiting patient from return to work, managing medications, managing appointments, managing finances, household responsibilities, ADLs/IADLs, and effectively communicating at home and in community. Factors affecting potential to achieve goals and functional outcome are  N/A .Marland Kitchen Patient will benefit from skilled SLP services to address above impairments and improve overall function.  REHAB POTENTIAL: Excellent  PLAN: SLP FREQUENCY: 1-2x/week  SLP DURATION: 12 weeks  PLANNED INTERVENTIONS: Language facilitation, Cueing hierachy, Cognitive reorganization, Internal/external aids, Functional tasks, SLP instruction and feedback, Compensatory strategies, and  Patient/family education   Zakia Sainato B. Dreama Saa, M.S., CCC-SLP, CBIS Speech-Language Pathologist Certified Brain Injury Specialist [facility]  [facility] [phone] Ascom [phone] Fax [phone]          ++

## 2022-12-05 ENCOUNTER — Encounter: Payer: PPO | Admitting: Occupational Therapy

## 2022-12-05 ENCOUNTER — Ambulatory Visit: Payer: PPO | Admitting: Speech Pathology

## 2022-12-05 ENCOUNTER — Ambulatory Visit: Payer: PPO | Admitting: Physical Therapy

## 2022-12-05 DIAGNOSIS — R41841 Cognitive communication deficit: Secondary | ICD-10-CM

## 2022-12-05 DIAGNOSIS — I619 Nontraumatic intracerebral hemorrhage, unspecified: Secondary | ICD-10-CM

## 2022-12-05 NOTE — Therapy (Signed)
OUTPATIENT SPEECH LANGUAGE PATHOLOGY  TREATMENT NOTE  Patient Name: Billy Kim MRN: [id number] DOB:October 26, 1945, 77 y.o., male Today's Date: 12/05/2022  PCP: Crawford Givens, MD REFERRING PROVIDER: Claudette Laws, MD     End of Session - 12/05/22 1258     Visit Number 24    Number of Visits 41    Date for SLP Re-Evaluation 01/30/23    Authorization Type [employer]    Progress Note Due on Visit 30    SLP Start Time 1145    SLP Stop Time  1230    SLP Time Calculation (min) 45 min    Activity Tolerance Patient tolerated treatment well            No past medical history on file. The histories are not reviewed yet. Please review them in the "History" navigator section and refresh this SmartLink. Patient Active Problem List   Diagnosis Date Noted   Healthcare maintenance 11/14/2022   Atypical chest pain 11/14/2022   Sinusitis 09/20/2022   Intraparenchymal hemorrhage of brain (HCC) 07/31/2022   AKI (acute kidney injury) (HCC) 07/27/2022   Hypertensive emergency 07/27/2022   Nontraumatic subcortical hemorrhage of left cerebral hemisphere (HCC) 07/25/2022   Weakness 07/20/2022   Leg swelling 07/20/2022   Dizziness 11/03/2021   Actinic keratosis 08/07/2021   Melanocytic nevi of trunk 08/07/2021   Nevus of back 08/07/2021   Personal history of other malignant neoplasm of skin 08/07/2021   Medicare annual wellness visit, subsequent 11/09/2020   Coronary artery disease of native artery of native heart with stable angina pectoris (HCC) 12/17/2019   Hyperlipidemia LDL goal <70 12/17/2019   Accelerating angina (HCC) 12/11/2019   Shingles 03/01/2019   FH: prostate cancer 02/21/2017   Advance care planning 03/31/2014   PSA elevation 03/27/2013   Thrombocytopenia, unspecified (HCC) 03/27/2013   Cough 03/12/2013   ED (erectile dysfunction) 12/28/2011   Neoplasm of uncertain behavior of skin 12/27/2010   Exertional chest pain 01/05/2010   Essential hypertension  04/08/2007   HYPERTRIGLYCERIDEMIA 01/08/2007   Disorder of bilirubin excretion 01/08/2007   Ulcerative colitis (HCC) 01/08/2007    ONSET DATE: 07/25/2022   REFERRING DIAG: I61.9 (ICD-10-CM) - Intraparenchymal hemorrhage of brain (HCC)   THERAPY DIAG:  Cognitive communication deficit  Intraparenchymal hemorrhage of brain (HCC)  Rationale for Evaluation and Treatment Rehabilitation  SUBJECTIVE:   PERTINENT HISTORY and DIAGNOSTIC FINDINGS: Pt is a right handed 77 year old male with past medical history of HLD, HTN, CAD with recent intra parenchymal hemorrhage on 07/25/2022. MRI (07/26/2022) revealed "known acute hemorrhage in the left thalamus measuring up to 2 cm and chronic small vessel ischemia with chronic microhemorrhages."   PAIN:  Are you having pain? No   FALLS: Has patient fallen in last 6 months?  No  LIVING ENVIRONMENT: Lives with: lives with their spouse Lives in: House/apartment  PLOF:  Level of assistance: Independent with ADLs, Independent with IADLs Employment: Full-time employment   PATIENT GOALS   to get better  SUBJECTIVE STATEMENT: Pt reports working with his son Pt accompanied by: wife  OBJECTIVE:   TODAY'S TREATMENT:  Skilled ST session focused on pt's cognitive communication goals. SLP facilitated the session by providing the following interventions:  Pt completed HEP, had memory book updated with information. He reported that "I am not doing so well with this" pointing to directions for adding new contact in his phone.  Pt required cues to follow the written directions instead of starting with whatever was up on his phone. 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 Yes  SHORT TERM GOALS: Target date: 10 sessions  Updated 10/04/2022  Updated 11/21/2022   2.  Pt will report improved cognitive communication via PROM by 5 points at last ST session    Baseline:  Goal status: INITIAL; progress made  3.   With Min A, patient will recall functional verbal information 80% accuracy with use of compensations/strategies (repeats, rephrasing, notetaking, etc).   Baseline:  Goal status: INITIAL; MET; upgraded to  With supervision A, patient will recall functional verbal information 80% accuracy with use of compensations/strategies (repeats, rephrasing, notetaking, etc): progress made - newly introduced compensatory aid  4.  With Min A, patient will generate sentences with 3 or more words in response to a situation at 95% accuracy in order to increase ability to communicate basic wants and needs.    Baseline:  Goal status: INITIAL: MET upgraded to With supervision A, patient will generate sentences with 3 or more words in response to a situation at 95% accuracy in order to increase ability to communicate basic wants and needs.: while progress has been made, pt requires more than a reasonable amount of time  LONG TERM GOALS: Target date: 01/30/2023  Updated: 11/21/2022 With Supervision A, patient will participate in complex conversation at 90% accuracy to increase ability to communicate complex thoughts, feelings, and needs.  Baseline:  Goal status: INITIAL: ongoing: pt continues with delayed response times  2.   With  Supervision A, patient will demonstrate strategies for processing/recall (eg notetaking, paraphrasing) successfully by recalling >90% of details from 15 minutes verbal information.   Baseline:  Goal status: INITIAL: ongoing: progress made, newly introduce compensatory aid  ASSESSMENT:  CLINICAL IMPRESSION: Patient is a 77 y.o. right handed male who was seen today for cognitive communication treatment session d/t recent left thalamic hemorrhage. 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  Skilled education provided on ways to continue cognitive stimulation such as practicing the organ. See skilled treatment note for details on skilled ways to target these deficits.  OBJECTIVE IMPAIRMENTS include memory, executive functioning, expressive language, and aphasia. These impairments are limiting patient from return to work, managing medications, managing appointments, managing finances, household responsibilities, ADLs/IADLs, and effectively communicating at home and in community. Factors affecting potential to achieve goals and functional outcome are  N/A .Marland Kitchen Patient will benefit from skilled SLP services to address above impairments and improve overall function.  REHAB POTENTIAL: Excellent  PLAN: SLP FREQUENCY: 1-2x/week  SLP DURATION: 12 weeks  PLANNED  INTERVENTIONS: Language facilitation, Cueing hierachy, Cognitive reorganization, Internal/external aids, Functional tasks, SLP instruction and feedback, Compensatory strategies, and Patient/family education   Macon Lesesne B. Dreama Saa, M.S., CCC-SLP, CBIS Speech-Language Pathologist Certified Brain Injury Specialist [facility]  [facility] [phone] Ascom [phone] Fax  [phone]          ++

## 2022-12-06 DIAGNOSIS — R002 Palpitations: Secondary | ICD-10-CM | POA: Diagnosis not present

## 2022-12-10 ENCOUNTER — Encounter: Payer: PPO | Admitting: Occupational Therapy

## 2022-12-10 ENCOUNTER — Ambulatory Visit: Payer: PPO | Admitting: Physical Therapy

## 2022-12-10 ENCOUNTER — Ambulatory Visit: Payer: PPO | Admitting: Speech Pathology

## 2022-12-10 DIAGNOSIS — R41841 Cognitive communication deficit: Secondary | ICD-10-CM | POA: Diagnosis not present

## 2022-12-10 DIAGNOSIS — I619 Nontraumatic intracerebral hemorrhage, unspecified: Secondary | ICD-10-CM

## 2022-12-10 NOTE — Therapy (Signed)
OUTPATIENT SPEECH LANGUAGE PATHOLOGY  TREATMENT NOTE  Patient Name: Billy Kim MRN: [id number] DOB:15-Mar-1945, 77 y.o., male Today's Date: 12/10/2022  PCP: Crawford Givens, MD REFERRING PROVIDER: Claudette Laws, MD     End of Session - 12/10/22 1055     Visit Number 25    Number of Visits 41    Date for SLP Re-Evaluation 01/30/23    Authorization Type [employer]    Progress Note Due on Visit 30    SLP Start Time 1100    SLP Stop Time  1145    SLP Time Calculation (min) 45 min    Activity Tolerance Patient tolerated treatment well            No past medical history on file. The histories are not reviewed yet. Please review them in the "History" navigator section and refresh this SmartLink. Patient Active Problem List   Diagnosis Date Noted   Healthcare maintenance 11/14/2022   Atypical chest pain 11/14/2022   Sinusitis 09/20/2022   Intraparenchymal hemorrhage of brain (HCC) 07/31/2022   AKI (acute kidney injury) (HCC) 07/27/2022   Hypertensive emergency 07/27/2022   Nontraumatic subcortical hemorrhage of left cerebral hemisphere (HCC) 07/25/2022   Weakness 07/20/2022   Leg swelling 07/20/2022   Dizziness 11/03/2021   Actinic keratosis 08/07/2021   Melanocytic nevi of trunk 08/07/2021   Nevus of back 08/07/2021   Personal history of other malignant neoplasm of skin 08/07/2021   Medicare annual wellness visit, subsequent 11/09/2020   Coronary artery disease of native artery of native heart with stable angina pectoris (HCC) 12/17/2019   Hyperlipidemia LDL goal <70 12/17/2019   Accelerating angina (HCC) 12/11/2019   Shingles 03/01/2019   FH: prostate cancer 02/21/2017   Advance care planning 03/31/2014   PSA elevation 03/27/2013   Thrombocytopenia, unspecified (HCC) 03/27/2013   Cough 03/12/2013   ED (erectile dysfunction) 12/28/2011   Neoplasm of uncertain behavior of skin 12/27/2010   Exertional chest pain 01/05/2010   Essential hypertension  04/08/2007   HYPERTRIGLYCERIDEMIA 01/08/2007   Disorder of bilirubin excretion 01/08/2007   Ulcerative colitis (HCC) 01/08/2007    ONSET DATE: 07/25/2022   REFERRING DIAG: I61.9 (ICD-10-CM) - Intraparenchymal hemorrhage of brain (HCC)   THERAPY DIAG:  Cognitive communication deficit  Intraparenchymal hemorrhage of brain (HCC)  Rationale for Evaluation and Treatment Rehabilitation  SUBJECTIVE:   PERTINENT HISTORY and DIAGNOSTIC FINDINGS: Pt is a right handed 77 year old male with past medical history of HLD, HTN, CAD with recent intra parenchymal hemorrhage on 07/25/2022. MRI (07/26/2022) revealed "known acute hemorrhage in the left thalamus measuring up to 2 cm and chronic small vessel ischemia with chronic microhemorrhages."   PAIN:  Are you having pain? No   FALLS: Has patient fallen in last 6 months?  No  LIVING ENVIRONMENT: Lives with: lives with their spouse Lives in: House/apartment  PLOF:  Level of assistance: Independent with ADLs, Independent with IADLs Employment: Full-time employment   PATIENT GOALS   to get better  SUBJECTIVE STATEMENT: "Billy Kim has been sick this weekend" Pt accompanied by: himself  OBJECTIVE:   TODAY'S TREATMENT:  Skilled ST session focused on pt's cognitive communication goals. 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 With SLP cuing, pt will schedule a time to practice at church this week.    Pt reports that he was not able to send his wife a text message when he was by himself yesterday.  However, pt was Mod I when attempting during session x 3  With cue, pt wrote down on Wednesday that he was to meet his grandson at church at 6:30pm to practice organ - pt required cue to initiate writing in his memorybook   PATIENT EDUCATION: Education details: see above Person educated: Patient, Education method: Explanation Education  comprehension: needs further education  HOME EXERCISE PROGRAM:        Complete daily schedule  Sent text to this Clinical research associate on Friday  GOALS:  Goals reviewed with patient? Yes  SHORT TERM GOALS: Target date: 10 sessions  Updated 10/04/2022  Updated 11/21/2022   2.  Pt will report improved cognitive communication via PROM by 5 points at last ST session    Baseline:  Goal status: INITIAL; progress made  3.   With Min A, patient will recall functional verbal information 80% accuracy with use of compensations/strategies (repeats, rephrasing, notetaking, etc).   Baseline:  Goal status: INITIAL; MET; upgraded to  With supervision A, patient will recall functional verbal information 80% accuracy with use of compensations/strategies (repeats, rephrasing, notetaking, etc): progress made - newly introduced compensatory aid  4.  With Min A, patient will generate sentences with 3 or more words in response to a situation at 95% accuracy in order to increase ability to communicate basic wants and needs.    Baseline:  Goal status: INITIAL: MET upgraded to With supervision A, patient will generate sentences with 3 or more words in response to a situation at 95% accuracy in order to increase ability to communicate basic wants and needs.: while progress has been made, pt requires more than a reasonable amount of time  LONG TERM GOALS: Target date: 01/30/2023  Updated: 11/21/2022 With Supervision A, patient will participate in complex conversation at 90% accuracy to increase ability to communicate complex thoughts, feelings, and needs.  Baseline:  Goal status: INITIAL: ongoing: pt continues with delayed response times  2.   With Supervision A, patient will demonstrate strategies for processing/recall (eg notetaking, paraphrasing) successfully by recalling >90% of details from 15 minutes verbal information.   Baseline:  Goal status: INITIAL: ongoing: progress made, newly introduce compensatory  aid  ASSESSMENT:  CLINICAL IMPRESSION: Patient is a 77 y.o. right handed male who was seen today for cognitive communication treatment session d/t recent left thalamic hemorrhage. Pt reports playing organ In church d/t SLP recommendations. See skilled treatment note for details on skilled ways to target these deficits.  OBJECTIVE IMPAIRMENTS include memory, executive functioning, expressive language, and aphasia. These impairments are limiting patient from return to work, managing medications, managing appointments, managing finances, household responsibilities, ADLs/IADLs, and effectively communicating at home and in community. Factors affecting potential to achieve goals and functional outcome are  N/A .Marland Kitchen Patient will benefit from skilled SLP services to address above impairments and improve overall function.  REHAB POTENTIAL: Excellent  PLAN: SLP FREQUENCY: 1-2x/week  SLP DURATION: 12 weeks  PLANNED INTERVENTIONS: Language facilitation, Cueing hierachy, Cognitive reorganization, Internal/external aids, Functional tasks, SLP instruction and feedback, Compensatory strategies, and Patient/family education   Amear Strojny B. Dreama Saa, M.S., CCC-SLP, CBIS Speech-Language Pathologist Certified Brain Injury Specialist [facility]  [facility] [phone] Ascom [phone] Fax [phone]          ++

## 2022-12-12 ENCOUNTER — Encounter: Payer: PPO | Admitting: Occupational Therapy

## 2022-12-12 ENCOUNTER — Ambulatory Visit: Payer: PPO | Admitting: Speech Pathology

## 2022-12-12 ENCOUNTER — Ambulatory Visit: Payer: PPO | Admitting: Physical Therapy

## 2022-12-12 DIAGNOSIS — R41841 Cognitive communication deficit: Secondary | ICD-10-CM | POA: Diagnosis not present

## 2022-12-12 DIAGNOSIS — I619 Nontraumatic intracerebral hemorrhage, unspecified: Secondary | ICD-10-CM

## 2022-12-12 NOTE — Therapy (Signed)
OUTPATIENT SPEECH LANGUAGE PATHOLOGY  TREATMENT NOTE  Patient Name: Billy Kim MRN: [id number] DOB:1945/12/05, 77 y.o., male Today's Date: 12/12/2022  PCP: Crawford Givens, MD REFERRING PROVIDER: Claudette Laws, MD     End of Session - 12/12/22 0930     Visit Number 26    Number of Visits 41    Date for SLP Re-Evaluation 01/30/23    Authorization Type [employer]    Progress Note Due on Visit 30    SLP Start Time 0930    SLP Stop Time  1015    SLP Time Calculation (min) 45 min    Activity Tolerance Patient tolerated treatment well            No past medical history on file. The histories are not reviewed yet. Please review them in the "History" navigator section and refresh this SmartLink. Patient Active Problem List   Diagnosis Date Noted   Healthcare maintenance 11/14/2022   Atypical chest pain 11/14/2022   Sinusitis 09/20/2022   Intraparenchymal hemorrhage of brain (HCC) 07/31/2022   AKI (acute kidney injury) (HCC) 07/27/2022   Hypertensive emergency 07/27/2022   Nontraumatic subcortical hemorrhage of left cerebral hemisphere (HCC) 07/25/2022   Weakness 07/20/2022   Leg swelling 07/20/2022   Dizziness 11/03/2021   Actinic keratosis 08/07/2021   Melanocytic nevi of trunk 08/07/2021   Nevus of back 08/07/2021   Personal history of other malignant neoplasm of skin 08/07/2021   Medicare annual wellness visit, subsequent 11/09/2020   Coronary artery disease of native artery of native heart with stable angina pectoris (HCC) 12/17/2019   Hyperlipidemia LDL goal <70 12/17/2019   Accelerating angina (HCC) 12/11/2019   Shingles 03/01/2019   FH: prostate cancer 02/21/2017   Advance care planning 03/31/2014   PSA elevation 03/27/2013   Thrombocytopenia, unspecified (HCC) 03/27/2013   Cough 03/12/2013   ED (erectile dysfunction) 12/28/2011   Neoplasm of uncertain behavior of skin 12/27/2010   Exertional chest pain 01/05/2010   Essential hypertension  04/08/2007   HYPERTRIGLYCERIDEMIA 01/08/2007   Disorder of bilirubin excretion 01/08/2007   Ulcerative colitis (HCC) 01/08/2007    ONSET DATE: 07/25/2022   REFERRING DIAG: I61.9 (ICD-10-CM) - Intraparenchymal hemorrhage of brain (HCC)   THERAPY DIAG:  Cognitive communication deficit  Intraparenchymal hemorrhage of brain (HCC)  Rationale for Evaluation and Treatment Rehabilitation  SUBJECTIVE:   PERTINENT HISTORY and DIAGNOSTIC FINDINGS: Pt is a right handed 77 year old male with past medical history of HLD, HTN, CAD with recent intra parenchymal hemorrhage on 07/25/2022. MRI (07/26/2022) revealed "known acute hemorrhage in the left thalamus measuring up to 2 cm and chronic small vessel ischemia with chronic microhemorrhages."   PAIN:  Are you having pain? No   FALLS: Has patient fallen in last 6 months?  No  LIVING ENVIRONMENT: Lives with: lives with their spouse Lives in: House/apartment  PLOF:  Level of assistance: Independent with ADLs, Independent with IADLs Employment: Full-time employment   PATIENT GOALS   to get better  SUBJECTIVE STATEMENT: Reports that his wife is still not feeling so he arrives by himself Pt accompanied by: himself  OBJECTIVE:   TODAY'S TREATMENT:  Skilled ST session focused on pt's cognitive communication goals. SLP facilitated the session by providing the following interventions:  He brought in his memory book with it completed and was able to use to accurately recall information for past activities.   Pt Mod I with putting down reminders in his memory book to perform tasks during this session.   Pt  provided with semi-complex daily scheduling task. With more than a reasonable amount of time (>30 minutes), pt was able to complete with 100% accuracy.    PATIENT EDUCATION: Education details: see above Person educated: Patient, Education method: Explanation Education comprehension: needs further education  HOME EXERCISE PROGRAM:         Complete daily schedule  Sent text to this writer on Friday  GOALS:  Goals reviewed with patient? Yes  SHORT TERM GOALS: Target date: 10 sessions  Updated 10/04/2022  Updated 11/21/2022   2.  Pt will report improved cognitive communication via PROM by 5 points at last ST session    Baseline:  Goal status: INITIAL; progress made  3.   With Min A, patient will recall functional verbal information 80% accuracy with use of compensations/strategies (repeats, rephrasing, notetaking, etc).   Baseline:  Goal status: INITIAL; MET; upgraded to  With supervision A, patient will recall functional verbal information 80% accuracy with use of compensations/strategies (repeats, rephrasing, notetaking, etc): progress made - newly introduced compensatory aid  4.  With Min A, patient will generate sentences with 3 or more words in response to a situation at 95% accuracy in order to increase ability to communicate basic wants and needs.    Baseline:  Goal status: INITIAL: MET upgraded to With supervision A, patient will generate sentences with 3 or more words in response to a situation at 95% accuracy in order to increase ability to communicate basic wants and needs.: while progress has been made, pt requires more than a reasonable amount of time  LONG TERM GOALS: Target date: 01/30/2023  Updated: 11/21/2022 With Supervision A, patient will participate in complex conversation at 90% accuracy to increase ability to communicate complex thoughts, feelings, and needs.  Baseline:  Goal status: INITIAL: ongoing: pt continues with delayed response times  2.   With Supervision A, patient will demonstrate strategies for processing/recall (eg notetaking, paraphrasing) successfully by recalling >90% of details from 15 minutes verbal information.   Baseline:  Goal status: INITIAL: ongoing: progress made, newly introduce compensatory aid  ASSESSMENT:  CLINICAL IMPRESSION: Patient is a 77 y.o. right handed male  who was seen today for cognitive communication treatment session d/t recent left thalamic hemorrhage. Pt continues to require more than a reasonable amount of time to independently complete tasks. See skilled treatment note for details on skilled ways to target these deficits.  OBJECTIVE IMPAIRMENTS include memory, executive functioning, expressive language, and aphasia. These impairments are limiting patient from return to work, managing medications, managing appointments, managing finances, household responsibilities, ADLs/IADLs, and effectively communicating at home and in community. Factors affecting potential to achieve goals and functional outcome are  N/A .Marland Kitchen Patient will benefit from skilled SLP services to address above impairments and improve overall function.  REHAB POTENTIAL: Excellent  PLAN: SLP FREQUENCY: 1-2x/week  SLP DURATION: 12 weeks  PLANNED INTERVENTIONS: Language facilitation, Cueing hierachy, Cognitive reorganization, Internal/external aids, Functional tasks, SLP instruction and feedback, Compensatory strategies, and Patient/family education   Venba Zenner B. Dreama Saa, M.S., CCC-SLP, CBIS Speech-Language Pathologist Certified Brain Injury Specialist [facility]  [facility] [phone] Ascom [phone] Fax [phone]          ++

## 2022-12-17 ENCOUNTER — Encounter: Payer: PPO | Admitting: Occupational Therapy

## 2022-12-17 ENCOUNTER — Ambulatory Visit: Payer: PPO | Admitting: Physical Therapy

## 2022-12-17 ENCOUNTER — Ambulatory Visit: Payer: PPO | Admitting: Speech Pathology

## 2022-12-17 DIAGNOSIS — R41841 Cognitive communication deficit: Secondary | ICD-10-CM

## 2022-12-17 DIAGNOSIS — I619 Nontraumatic intracerebral hemorrhage, unspecified: Secondary | ICD-10-CM

## 2022-12-17 NOTE — Therapy (Signed)
OUTPATIENT SPEECH LANGUAGE PATHOLOGY  TREATMENT NOTE  Patient Name: Billy Kim MRN: [id number] DOB:1945/02/27, 77 y.o., male Today's Date: 12/17/2022  PCP: Crawford Givens, MD REFERRING PROVIDER: Claudette Laws, MD     End of Session - 12/17/22 1104     Visit Number 27    Number of Visits 41    Date for SLP Re-Evaluation 01/30/23    Authorization Type [employer]    Progress Note Due on Visit 30    SLP Start Time 1100    SLP Stop Time  1145    SLP Time Calculation (min) 45 min    Activity Tolerance Patient tolerated treatment well            No past medical history on file. The histories are not reviewed yet. Please review them in the "History" navigator section and refresh this SmartLink. Patient Active Problem List   Diagnosis Date Noted   Healthcare maintenance 11/14/2022   Atypical chest pain 11/14/2022   Sinusitis 09/20/2022   Intraparenchymal hemorrhage of brain (HCC) 07/31/2022   AKI (acute kidney injury) (HCC) 07/27/2022   Hypertensive emergency 07/27/2022   Nontraumatic subcortical hemorrhage of left cerebral hemisphere (HCC) 07/25/2022   Weakness 07/20/2022   Leg swelling 07/20/2022   Dizziness 11/03/2021   Actinic keratosis 08/07/2021   Melanocytic nevi of trunk 08/07/2021   Nevus of back 08/07/2021   Personal history of other malignant neoplasm of skin 08/07/2021   Medicare annual wellness visit, subsequent 11/09/2020   Coronary artery disease of native artery of native heart with stable angina pectoris (HCC) 12/17/2019   Hyperlipidemia LDL goal <70 12/17/2019   Accelerating angina (HCC) 12/11/2019   Shingles 03/01/2019   FH: prostate cancer 02/21/2017   Advance care planning 03/31/2014   PSA elevation 03/27/2013   Thrombocytopenia, unspecified (HCC) 03/27/2013   Cough 03/12/2013   ED (erectile dysfunction) 12/28/2011   Neoplasm of uncertain behavior of skin 12/27/2010   Exertional chest pain 01/05/2010   Essential hypertension  04/08/2007   HYPERTRIGLYCERIDEMIA 01/08/2007   Disorder of bilirubin excretion 01/08/2007   Ulcerative colitis (HCC) 01/08/2007    ONSET DATE: 07/25/2022   REFERRING DIAG: I61.9 (ICD-10-CM) - Intraparenchymal hemorrhage of brain (HCC)   THERAPY DIAG:  Cognitive communication deficit  Intraparenchymal hemorrhage of brain (HCC)  Rationale for Evaluation and Treatment Rehabilitation  SUBJECTIVE:   PERTINENT HISTORY and DIAGNOSTIC FINDINGS: Pt is a right handed 77 year old male with past medical history of HLD, HTN, CAD with recent intra parenchymal hemorrhage on 07/25/2022. MRI (07/26/2022) revealed "known acute hemorrhage in the left thalamus measuring up to 2 cm and chronic small vessel ischemia with chronic microhemorrhages."   PAIN:  Are you having pain? No   FALLS: Has patient fallen in last 6 months?  No  LIVING ENVIRONMENT: Lives with: lives with their spouse Lives in: House/apartment  PLOF:  Level of assistance: Independent with ADLs, Independent with IADLs Employment: Full-time employment   PATIENT GOALS   to get better  SUBJECTIVE STATEMENT: Pt and his wife participated in their church's fall festival Pt accompanied by: himself  OBJECTIVE:   TODAY'S TREATMENT:  Skilled ST session focused on pt's cognitive communication goals. 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 Yes  SHORT TERM GOALS: Target date: 10 sessions  Updated 10/04/2022  Updated 11/21/2022   2.  Pt will report improved cognitive communication via PROM by 5 points at last ST session    Baseline:  Goal status: INITIAL; progress made  3.   With Min A, patient will recall functional verbal information 80% accuracy with use of compensations/strategies (repeats, rephrasing, notetaking, etc).   Baseline:  Goal status: INITIAL; MET; upgraded to  With supervision A, patient will recall functional verbal information 80% accuracy with use of compensations/strategies (repeats, rephrasing, notetaking, etc): progress made - newly introduced compensatory aid  4.  With Min A, patient will generate sentences with 3 or more words in response to a situation at 95% accuracy in order to increase ability to communicate basic wants and needs.    Baseline:  Goal status: INITIAL: MET upgraded to With supervision A, patient will generate sentences with 3 or more words in response to a situation at 95% accuracy in order to increase ability to communicate basic wants and needs.: while progress has been made, pt requires more than a reasonable amount of time  LONG TERM GOALS: Target date: 01/30/2023  Updated: 11/21/2022 With Supervision A, patient will participate in complex conversation at 90% accuracy to increase ability to communicate complex thoughts, feelings, and needs.  Baseline:  Goal status: INITIAL: ongoing: pt continues with delayed response times  2.   With Supervision A, patient will demonstrate strategies for processing/recall (eg notetaking, paraphrasing) successfully by recalling >90% of details from 15 minutes verbal information.   Baseline:  Goal status: INITIAL: ongoing: progress made, newly introduce compensatory aid  ASSESSMENT:  CLINICAL  IMPRESSION: Patient is a 77 y.o. right handed male who was seen today for cognitive communication treatment session d/t recent left thalamic hemorrhage. Pt continues to take ownership of memory book. See skilled treatment note for details.   OBJECTIVE IMPAIRMENTS include memory, executive functioning, expressive language, and aphasia. These impairments are limiting patient from return to work, managing medications, managing appointments, managing finances, household responsibilities, ADLs/IADLs, and effectively communicating at home and in community. Factors affecting potential to achieve goals and functional outcome are  N/A .Marland Kitchen Patient will benefit from skilled SLP services to address above impairments and improve overall function.  REHAB POTENTIAL: Excellent  PLAN: SLP FREQUENCY: 1-2x/week  SLP DURATION: 12 weeks  PLANNED INTERVENTIONS: Language facilitation, Cueing hierachy, Cognitive reorganization, Internal/external aids, Functional tasks, SLP instruction and feedback, Compensatory strategies, and Patient/family education   Jerol Rufener B. Dreama Saa, M.S., CCC-SLP, CBIS Speech-Language Pathologist Certified Brain Injury Specialist [facility]  [facility] [phone] Ascom [phone] Fax [phone]          ++

## 2022-12-19 ENCOUNTER — Ambulatory Visit: Payer: PPO | Admitting: Speech Pathology

## 2022-12-19 ENCOUNTER — Encounter: Payer: PPO | Admitting: Occupational Therapy

## 2022-12-19 ENCOUNTER — Ambulatory Visit: Payer: PPO | Admitting: Physical Therapy

## 2022-12-24 ENCOUNTER — Ambulatory Visit: Payer: PPO | Admitting: Physical Therapy

## 2022-12-24 ENCOUNTER — Ambulatory Visit (INDEPENDENT_AMBULATORY_CARE_PROVIDER_SITE_OTHER): Payer: PPO

## 2022-12-24 ENCOUNTER — Ambulatory Visit: Payer: PPO | Attending: Physician Assistant | Admitting: Speech Pathology

## 2022-12-24 ENCOUNTER — Encounter: Payer: PPO | Admitting: Occupational Therapy

## 2022-12-24 DIAGNOSIS — I619 Nontraumatic intracerebral hemorrhage, unspecified: Secondary | ICD-10-CM | POA: Diagnosis not present

## 2022-12-24 DIAGNOSIS — R41841 Cognitive communication deficit: Secondary | ICD-10-CM | POA: Insufficient documentation

## 2022-12-24 DIAGNOSIS — Z0000 Encounter for general adult medical examination without abnormal findings: Secondary | ICD-10-CM | POA: Diagnosis not present

## 2022-12-24 NOTE — Therapy (Signed)
OUTPATIENT SPEECH LANGUAGE PATHOLOGY  TREATMENT NOTE  Patient Name: Billy Kim MRN: [id number] DOB:10-Jun-1945, 77 y.o., male Today's Date: 12/24/2022  PCP: Crawford Givens, MD REFERRING PROVIDER: Claudette Laws, MD     End of Session - 12/24/22 1134     Visit Number 28    Number of Visits 41    Date for SLP Re-Evaluation 01/30/23    Authorization Type [employer]    Progress Note Due on Visit 30    SLP Start Time 1100    SLP Stop Time  1145    SLP Time Calculation (min) 45 min    Activity Tolerance Patient tolerated treatment well            No past medical history on file. The histories are not reviewed yet. Please review them in the "History" navigator section and refresh this SmartLink. Patient Active Problem List   Diagnosis Date Noted   Healthcare maintenance 11/14/2022   Atypical chest pain 11/14/2022   Sinusitis 09/20/2022   Intraparenchymal hemorrhage of brain (HCC) 07/31/2022   AKI (acute kidney injury) (HCC) 07/27/2022   Hypertensive emergency 07/27/2022   Nontraumatic subcortical hemorrhage of left cerebral hemisphere (HCC) 07/25/2022   Weakness 07/20/2022   Leg swelling 07/20/2022   Dizziness 11/03/2021   Actinic keratosis 08/07/2021   Melanocytic nevi of trunk 08/07/2021   Nevus of back 08/07/2021   Personal history of other malignant neoplasm of skin 08/07/2021   Medicare annual wellness visit, subsequent 11/09/2020   Coronary artery disease of native artery of native heart with stable angina pectoris (HCC) 12/17/2019   Hyperlipidemia LDL goal <70 12/17/2019   Accelerating angina (HCC) 12/11/2019   Shingles 03/01/2019   FH: prostate cancer 02/21/2017   Advance care planning 03/31/2014   PSA elevation 03/27/2013   Thrombocytopenia, unspecified (HCC) 03/27/2013   Cough 03/12/2013   ED (erectile dysfunction) 12/28/2011   Neoplasm of uncertain behavior of skin 12/27/2010   Exertional chest pain 01/05/2010   Essential hypertension  04/08/2007   HYPERTRIGLYCERIDEMIA 01/08/2007   Disorder of bilirubin excretion 01/08/2007   Ulcerative colitis (HCC) 01/08/2007    ONSET DATE: 07/25/2022   REFERRING DIAG: I61.9 (ICD-10-CM) - Intraparenchymal hemorrhage of brain (HCC)   THERAPY DIAG:  Cognitive communication deficit  Intraparenchymal hemorrhage of brain (HCC)  Rationale for Evaluation and Treatment Rehabilitation  SUBJECTIVE:   PERTINENT HISTORY and DIAGNOSTIC FINDINGS: Pt is a right handed 77 year old male with past medical history of HLD, HTN, CAD with recent intra parenchymal hemorrhage on 07/25/2022. MRI (07/26/2022) revealed "known acute hemorrhage in the left thalamus measuring up to 2 cm and chronic small vessel ischemia with chronic microhemorrhages."   PAIN:  Are you having pain? No   FALLS: Has patient fallen in last 6 months?  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 SLP facilitated the session by providing the following interventions:  Pt played the organ during his church's service yesterday. He was able to describe activity and reports having some questions for the organist who plays each Sunday. Pt arrived with his daily planner completed, engaged in conversation of unknown information without any tangential information or word  finding deficits.    Pt was Mod I for writing down information in his memory book as well as referring to his memory book for recall of information related to work events from previous week   Pt also reports sending out multiple texts over previous weeks   PATIENT  EDUCATION: Education details: see above Person educated: Patient, Education method: Explanation Education comprehension: needs further education  HOME EXERCISE PROGRAM:        Complete daily schedule  Sent text to this Clinical research associate on Friday  GOALS:  Goals reviewed with patient? Yes  SHORT TERM GOALS: Target date: 10 sessions  Updated 10/04/2022  Updated 11/21/2022   2.  Pt will report improved cognitive communication via PROM by 5 points at last ST session    Baseline:  Goal status: INITIAL; progress made  3.   With Min A, patient will recall functional verbal information 80% accuracy with use of compensations/strategies (repeats, rephrasing, notetaking, etc).   Baseline:  Goal status: INITIAL; MET; upgraded to  With supervision A, patient will recall functional verbal information 80% accuracy with use of compensations/strategies (repeats, rephrasing, notetaking, etc): progress made - newly introduced compensatory aid  4.  With Min A, patient will generate sentences with 3 or more words in response to a situation at 95% accuracy in order to increase ability to communicate basic wants and needs.    Baseline:  Goal status: INITIAL: MET upgraded to With supervision A, patient will generate sentences with 3 or more words in response to a situation at 95% accuracy in order to increase ability to communicate basic wants and needs.: while progress has been made, pt requires more than a reasonable amount of time  LONG TERM GOALS: Target date: 01/30/2023  Updated: 11/21/2022 With Supervision A, patient will participate in complex conversation at 90% accuracy to increase ability to communicate complex thoughts, feelings, and needs.  Baseline:  Goal status: INITIAL: ongoing: pt continues with delayed response times  2.   With Supervision A, patient will demonstrate strategies for processing/recall (eg notetaking, paraphrasing) successfully by recalling >90% of details from 15 minutes verbal  information.   Baseline:  Goal status: INITIAL: ongoing: progress made, newly introduce compensatory aid  ASSESSMENT:  CLINICAL IMPRESSION: Patient is a 77 y.o. right handed male who was seen today for cognitive communication treatment session d/t recent left thalamic hemorrhage. Pt continues to take ownership of memory book. See skilled treatment note for details.   OBJECTIVE IMPAIRMENTS include memory, executive functioning, expressive language, and aphasia. These impairments are limiting patient from return to work, managing medications, managing appointments, managing finances, household responsibilities, ADLs/IADLs, and effectively communicating at home and in community. Factors affecting potential to achieve goals and functional outcome are  N/A .Marland Kitchen Patient will benefit from skilled SLP services to address above impairments and improve overall function.  REHAB POTENTIAL: Excellent  PLAN: SLP FREQUENCY: 1-2x/week  SLP DURATION: 12 weeks  PLANNED INTERVENTIONS: Language facilitation, Cueing hierachy, Cognitive reorganization, Internal/external aids, Functional tasks, SLP instruction and feedback, Compensatory strategies, and Patient/family education   Coree Riester B. Dreama Saa, M.S., CCC-SLP, CBIS Speech-Language Pathologist Certified Brain Injury Specialist [facility]  [facility] [phone] Ascom [phone] Fax [phone]          ++

## 2022-12-24 NOTE — Patient Instructions (Signed)
Billy Kim , Thank you for taking time to come for your Medicare Wellness Visit. I appreciate your ongoing commitment to your health goals. Please review the following plan we discussed and let me know if I can assist you in the future.   Referrals/Orders/Follow-Ups/Clinician Recommendations: none  This is a list of the screening recommended for you and due dates:  Health Maintenance  Topic Date Due   Zoster (Shingles) Vaccine (1 of 2) Never done   Pneumonia Vaccine (2 of 2 - PCV) 01/06/2011   DTaP/Tdap/Td vaccine (2 - Tdap) 08/26/2016   Medicare Annual Wellness Visit  12/24/2023   Flu Shot  Completed   Hepatitis C Screening  Completed   HPV Vaccine  Aged Out   Colon Cancer Screening  Discontinued   COVID-19 Vaccine  Discontinued    Advanced directives: (Copy Requested) Please bring a copy of your health care power of attorney and living will to the office to be added to your chart at your convenience.  Next Medicare Annual Wellness Visit scheduled for next year: Yes  insert Preventive Care attachment Insert FALL PREVENTION attachment if needed

## 2022-12-24 NOTE — Progress Notes (Signed)
Subjective:   Billy Kim is a 77 y.o. male who presents for Medicare Annual/Subsequent preventive examination.  Visit Complete: Virtual I connected with  Billy Kim on 12/24/22 by a audio enabled telemedicine application and verified that I am speaking with the correct person using two identifiers.  Patient Location: Home  Provider Location: Office/Clinic  I discussed the limitations of evaluation and management by telemedicine. The patient expressed understanding and agreed to proceed.  Vital Signs: Because this visit was a virtual/telehealth visit, some criteria may be missing or patient reported. Any vitals not documented were not able to be obtained and vitals that have been documented are patient reported.    Cardiac Risk Factors include: advanced age (>36men, >5 women);dyslipidemia;hypertension;male gender     Objective:    Today's Vitals   There is no height or weight on file to calculate BMI.     12/24/2022    2:37 PM 08/22/2022    1:11 PM 08/22/2022    8:47 AM 08/15/2022    9:38 AM 07/31/2022    3:39 PM 07/25/2022    8:49 PM 08/04/2020    8:28 AM  Advanced Directives  Does Patient Have a Medical Advance Directive? Yes No No No No No Yes  Type of Advance Directive Living will      Healthcare Power of [employer];Living will  Copy of Healthcare Power of Attorney in Chart?       No - copy requested  Would patient like information on creating a medical advance directive?  Yes (Inpatient - patient defers creating a medical advance directive at this time - Information given)  No - Patient declined Yes (Inpatient - patient requests chaplain consult to create a medical advance directive) No - Patient declined     Current Medications (verified) Outpatient Encounter Medications as of 12/24/2022  Medication Sig   acetaminophen (TYLENOL) 325 MG tablet Take 2 tablets (650 mg total) by mouth every 4 (four) hours as needed for mild pain (or temp > 37.5 C (99.5 F)).   Ascorbic Acid  (VITAMIN C) 1000 MG tablet Take 1,000 mg by mouth daily.   carvedilol (COREG) 3.125 MG tablet Take 1 tablet (3.125 mg total) by mouth 2 (two) times daily with a meal.   cholecalciferol (VITAMIN D3) 25 MCG (1000 UNIT) tablet Take 2 tablets (2,000 Units total) by mouth daily.   ezetimibe (ZETIA) 10 MG tablet Take 1 tablet (10 mg total) by mouth daily.   famotidine (PEPCID) 20 MG tablet Take 1 tablet (20 mg total) by mouth daily as needed for heartburn or indigestion.   fluticasone (FLONASE) 50 MCG/ACT nasal spray Place 2 sprays into both nostrils daily.   Garlic 500 MG CAPS Take 500 mg by mouth daily.   isosorbide mononitrate (IMDUR) 120 MG 24 hr tablet Take 1 tablet (120 mg total) by mouth daily.   losartan (COZAAR) 50 MG tablet Take 1 tablet (50 mg total) by mouth daily.   meclizine (ANTIVERT) 25 MG tablet Take 0.5-1 tablet (12.5 mg- 25 mg) by mouth every 6 hours as needed for dizziness   nitroGLYCERIN (NITROSTAT) 0.4 MG SL tablet Place 1 tablet (0.4 mg total) under the tongue every 5 (five) minutes as needed for chest pain. Maximum of 3 doses.   Omega-3 Fatty Acids (FISH OIL) 1000 MG CAPS Take 1-2 capsules (1,000-2,000 mg total) by mouth daily.   omeprazole (PRILOSEC) 20 MG capsule Take 20 mg by mouth daily as needed.   No facility-administered encounter medications on file  as of 12/24/2022.    Allergies (verified) Amoxicillin, Lyrica [pregabalin], Morphine sulfate, Neurontin [gabapentin], Statins, and Sulfonamide derivatives   History: Past Medical History:  Diagnosis Date   Coronary artery disease    a. 11/2019 Cath: Moderate proximal LAD disease (not hemodynamically significant) and CTO's of D1 and non-dominant RCA.   HLD (hyperlipidemia)    Hypertension    Intracerebral hemorrhage (HCC)    a. 07/2022 CT Head: acute intraparenchymal hemorrhage in the left thalamus without midline shift or mass effect.   Systolic murmur    a. 07/2022 Echo: EF 60-65%, no rwma, GrI DD, nl RV fxn, RVSP  15.41mmHg, no significant valvular disease.   Ulcerative colitis 06/1979   Remission for years   Past Surgical History:  Procedure Laterality Date   CARDIAC CATHETERIZATION  03/20/01   Cardiolite EF 55% 02/10/02   CHOLECYSTECTOMY N/A 07/09/2017   Procedure: LAPAROSCOPIC CHOLECYSTECTOMY;  Surgeon: Jimmye Norman, MD;  Location: [facility] OR;  Service: General;  Laterality: N/A;   COLONOSCOPY  multiple   ENDOSCOPIC RETROGRADE CHOLANGIOPANCREATOGRAPHY (ERCP) WITH PROPOFOL N/A 07/08/2017   Procedure: ENDOSCOPIC RETROGRADE CHOLANGIOPANCREATOGRAPHY (ERCP) WITH PROPOFOL;  Surgeon: Lynann Bologna, MD;  Location: [facility] ENDOSCOPY;  Service: Endoscopy;  Laterality: N/A;   INGUINAL HERNIA REPAIR  04/09/06   Bilateral   LAPAROSCOPIC APPENDECTOMY  03/1981   LEFT HEART CATH AND CORONARY ANGIOGRAPHY Left 12/11/2019   Procedure: LEFT HEART CATH AND CORONARY ANGIOGRAPHY;  Surgeon: Yvonne Kendall, MD;  Location: [facility] INVASIVE CV LAB;  Service: Cardiovascular;  Laterality: Left;   REMOVAL OF STONES  07/08/2017   Procedure: REMOVAL OF STONES;  Surgeon: Lynann Bologna, MD;  Location: [facility] ENDOSCOPY;  Service: Endoscopy;;   SPHINCTEROTOMY  07/08/2017   Procedure: Dennison Mascot;  Surgeon: Lynann Bologna, MD;  Location: [facility] ENDOSCOPY;  Service: Endoscopy;;   Family History  Problem Relation Age of Onset   Cancer Mother        ? GYN, died after hysterectomy   Stroke Father    CAD Father    Hypertension Sister    Prostate cancer Brother    CAD Brother 46       CABG   Bladder Cancer Brother 18       Bladder   Hypertension Brother    Hypertension Brother    Hypertension Brother    Hypertension Brother    Hypertension Brother    Bone cancer Paternal Grandfather    Hypertension Son    Prostate cancer Nephew    Bladder Cancer Nephew    Colon cancer Neg Hx    Esophageal cancer Neg Hx    Rectal cancer Neg Hx    Stomach cancer Neg Hx    Social History   Socioeconomic History   Marital status: Married    Spouse name:  Not on file   Number of children: 1   Years of education: Not on file   Highest education level: Not on file  Occupational History   Occupation: Heritage manager: SELF    Comment: [employer] business  Tobacco Use   Smoking status: Never   Smokeless tobacco: Never  Vaping Use   Vaping status: Never Used  Substance and Sexual Activity   Alcohol use: No   Drug use: No   Sexual activity: Never  Other Topics Concern   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   Social Determinants of Health  Financial Resource Strain: Low Risk  (12/24/2022)   Overall Financial Resource Strain (CARDIA)    Difficulty of Paying Living Expenses: Not hard at all  Food Insecurity: No Food Insecurity (12/24/2022)   Hunger Vital Sign    Worried About Running Out of Food in the Last Year: Never true    Ran Out of Food in the Last Year: Never true  Transportation Needs: No Transportation Needs (12/24/2022)   PRAPARE - Administrator, Civil Service (Medical): No    Lack of Transportation (Non-Medical): No  Physical Activity: Insufficiently Active (12/24/2022)   Exercise Vital Sign    Days of Exercise per Week: 1 day    Minutes of Exercise per Session: 30 min  Stress: No Stress Concern Present (12/24/2022)   [employer] of Occupational Health - Occupational Stress Questionnaire    Feeling of Stress : Not at all  Social Connections: Moderately Integrated (12/24/2022)   Social Connection and Isolation Panel [NHANES]    Frequency of Communication with Friends and Family: Three times a week    Frequency of Social Gatherings with Friends and Family: Three times a week    Attends Religious Services: More than 4 times per year    Active Member of Clubs or Organizations: No    Attends Banker Meetings: Never    Marital Status: Married    Tobacco Counseling Counseling given: Not  Answered   Clinical Intake:  Pre-visit preparation completed: Yes  Pain : No/denies pain     Nutritional Risks: None Diabetes: No  How often do you need to have someone help you when you read instructions, pamphlets, or other written materials from your doctor or pharmacy?: 1 - Never  Interpreter Needed?: No  Information entered by :: NAllen LPN   Activities of Daily Living    12/24/2022    2:29 PM 07/31/2022    3:41 PM  In your present state of health, do you have any difficulty performing the following activities:  Hearing? 1   Comment scheduled for hearing test Tuesday   Vision? 0   Difficulty concentrating or making decisions? 1   Walking or climbing stairs? 0   Dressing or bathing? 0   Doing errands, shopping? 0 0  Preparing Food and eating ? N   Using the Toilet? N   In the past six months, have you accidently leaked urine? Y   Do you have problems with loss of bowel control? N   Managing your Medications? N   Managing your Finances? N   Housekeeping or managing your Housekeeping? N     Patient Care Team: Joaquim Nam, MD as PCP - General (Family Medicine) End, Cristal Deer, MD as PCP - Cardiology (Cardiology) Esaw Dace, MD as Attending Physician (Urology)  Indicate any recent Medical Services you may have received from other than [facility] providers in the past year (date may be approximate).     Assessment:   This is a routine wellness examination for [place].  Hearing/Vision screen Hearing Screening - Comments:: Going to have hearing test Tuesday Vision Screening - Comments:: Regular eye exams, [facility]   Goals Addressed             This Visit's Progress    Patient Stated       12/24/2022, continue with therapy       Depression Screen    12/24/2022    2:39 PM 11/13/2022    9:47 AM 10/11/2022    2:16  PM 10/04/2022   10:57 AM 09/26/2022   12:00 PM 08/21/2022    3:25 PM 08/21/2022    8:41 AM  PHQ 2/9 Scores  PHQ - 2 Score 0 0  0 0 0 1 0  PHQ- 9 Score 0 0  0 0 9 6    Fall Risk    12/24/2022    2:38 PM 11/13/2022    9:46 AM 10/11/2022    2:16 PM 10/04/2022   10:57 AM 09/26/2022   12:00 PM  Fall Risk   Falls in the past year? 0 0 0 0 0  Number falls in past yr: 0 0 0 0 0  Injury with Fall? 0 0 0 0 0  Risk for fall due to : Medication side effect No Fall Risks  No Fall Risks No Fall Risks  Follow up Falls prevention discussed;Falls evaluation completed Falls evaluation completed  Falls evaluation completed Falls evaluation completed    MEDICARE RISK AT HOME: Medicare Risk at Home Any stairs in or around the home?: Yes If so, are there any without handrails?: No Home free of loose throw rugs in walkways, pet beds, electrical cords, etc?: Yes Adequate lighting in your home to reduce risk of falls?: Yes Life alert?: No Use of a cane, walker or w/c?: No Grab bars in the bathroom?: Yes Shower chair or bench in shower?: No Elevated toilet seat or a handicapped toilet?: No  TIMED UP AND GO:  Was the test performed?  No    Cognitive Function:    08/04/2020    8:33 AM  MMSE - Mini Mental State Exam  Not completed: Refused        12/24/2022    2:40 PM  6CIT Screen  What Year? 0 points  What month? 0 points  What time? 0 points  Count back from 20 0 points  Months in reverse 0 points  Repeat phrase 0 points  Total Score 0 points    Immunizations Immunization History  Administered Date(s) Administered   Fluad Quad(high Dose 65+) 12/08/2019, 11/10/2021   Fluad Trivalent(High Dose 65+) 11/13/2022   Influenza Split 12/28/2011   Influenza Whole 12/29/2008, 01/05/2010   Influenza,inj,Quad PF,6+ Mos 03/29/2014, 04/01/2015   Pneumococcal Polysaccharide-23 01/05/2010   Td 08/27/2006    TDAP status: Due, Education has been provided regarding the importance of this vaccine. Advised may receive this vaccine at local pharmacy or Health Dept. Aware to provide a copy of the vaccination record if obtained from  local pharmacy or Health Dept. Verbalized acceptance and understanding.  Flu Vaccine status: Up to date  Pneumococcal vaccine status: Due, Education has been provided regarding the importance of this vaccine. Advised may receive this vaccine at local pharmacy or Health Dept. Aware to provide a copy of the vaccination record if obtained from local pharmacy or Health Dept. Verbalized acceptance and understanding.  Covid-19 vaccine status: Information provided on how to obtain vaccines.   Qualifies for Shingles Vaccine? Yes   Zostavax completed No   Shingrix Completed?: No.    Education has been provided regarding the importance of this vaccine. Patient has been advised to call insurance company to determine out of pocket expense if they have not yet received this vaccine. Advised may also receive vaccine at local pharmacy or Health Dept. Verbalized acceptance and understanding.  Screening Tests Health Maintenance  Topic Date Due   Zoster Vaccines- Shingrix (1 of 2) Never done   Pneumonia Vaccine 59+ Years old (2 of 2 - PCV) 01/06/2011  DTaP/Tdap/Td (2 - Tdap) 08/26/2016   Medicare Annual Wellness (AWV)  12/24/2023   INFLUENZA VACCINE  Completed   Hepatitis C Screening  Completed   HPV VACCINES  Aged Out   Colonoscopy  Discontinued   COVID-19 Vaccine  Discontinued    Health Maintenance  Health Maintenance Due  Topic Date Due   Zoster Vaccines- Shingrix (1 of 2) Never done   Pneumonia Vaccine 24+ Years old (2 of 2 - PCV) 01/06/2011   DTaP/Tdap/Td (2 - Tdap) 08/26/2016    Colorectal cancer screening: No longer required.   Lung Cancer Screening: (Low Dose CT Chest recommended if Age 107-80 years, 20 pack-year currently smoking OR have quit w/in 15years.) does not qualify.   Lung Cancer Screening Referral: no  Additional Screening:  Hepatitis C Screening: does qualify; Completed 04/10/2016  Vision Screening: Recommended annual ophthalmology exams for early detection of glaucoma  and other disorders of the eye. Is the patient up to date with their annual eye exam?  Yes  Who is the provider or what is the name of the office in which the patient attends annual eye exams? [facility] If pt is not established with a provider, would they like to be referred to a provider to establish care? No .   Dental Screening: Recommended annual dental exams for proper oral hygiene  Diabetic Foot Exam: n/a  Community Resource Referral / Chronic Care Management: CRR required this visit?  No   CCM required this visit?  No     Plan:     I have personally reviewed and noted the following in the patient's chart:   Medical and social history Use of alcohol, tobacco or illicit drugs  Current medications and supplements including opioid prescriptions. Patient is not currently taking opioid prescriptions. Functional ability and status Nutritional status Physical activity Advanced directives List of other physicians Hospitalizations, surgeries, and ER visits in previous 12 months Vitals Screenings to include cognitive, depression, and falls Referrals and appointments  In addition, I have reviewed and discussed with patient certain preventive protocols, quality metrics, and best practice recommendations. A written personalized care plan for preventive services as well as general preventive health recommendations were provided to patient.     Barb Merino, LPN   [id number]   After Visit Summary: (Pick Up) Due to this being a telephonic visit, with patients personalized plan was offered to patient and patient has requested to Pick up at office.  Nurse Notes: none

## 2022-12-26 ENCOUNTER — Ambulatory Visit: Payer: PPO | Admitting: Speech Pathology

## 2022-12-26 ENCOUNTER — Encounter: Payer: PPO | Admitting: Occupational Therapy

## 2022-12-26 ENCOUNTER — Ambulatory Visit: Payer: PPO | Admitting: Physical Therapy

## 2022-12-26 DIAGNOSIS — I619 Nontraumatic intracerebral hemorrhage, unspecified: Secondary | ICD-10-CM

## 2022-12-26 DIAGNOSIS — R41841 Cognitive communication deficit: Secondary | ICD-10-CM

## 2022-12-26 NOTE — Therapy (Signed)
OUTPATIENT SPEECH LANGUAGE PATHOLOGY  TREATMENT NOTE  Patient Name: Billy Kim MRN: [id number] DOB:07/11/1945, 77 y.o., male Today's Date: 12/26/2022  PCP: Crawford Givens, MD REFERRING PROVIDER: Claudette Laws, MD     End of Session - 12/26/22 1104     Visit Number 29    Number of Visits 41    Date for SLP Re-Evaluation 01/30/23    Authorization Type [employer]    Progress Note Due on Visit 30    SLP Start Time 1100    SLP Stop Time  1145    SLP Time Calculation (min) 45 min    Activity Tolerance Patient tolerated treatment well            No past medical history on file. The histories are not reviewed yet. Please review them in the "History" navigator section and refresh this SmartLink. Patient Active Problem List   Diagnosis Date Noted   Healthcare maintenance 11/14/2022   Atypical chest pain 11/14/2022   Sinusitis 09/20/2022   Intraparenchymal hemorrhage of brain (HCC) 07/31/2022   AKI (acute kidney injury) (HCC) 07/27/2022   Hypertensive emergency 07/27/2022   Nontraumatic subcortical hemorrhage of left cerebral hemisphere (HCC) 07/25/2022   Weakness 07/20/2022   Leg swelling 07/20/2022   Dizziness 11/03/2021   Actinic keratosis 08/07/2021   Melanocytic nevi of trunk 08/07/2021   Nevus of back 08/07/2021   Personal history of other malignant neoplasm of skin 08/07/2021   Medicare annual wellness visit, subsequent 11/09/2020   Coronary artery disease of native artery of native heart with stable angina pectoris (HCC) 12/17/2019   Hyperlipidemia LDL goal <70 12/17/2019   Accelerating angina (HCC) 12/11/2019   Shingles 03/01/2019   FH: prostate cancer 02/21/2017   Advance care planning 03/31/2014   PSA elevation 03/27/2013   Thrombocytopenia, unspecified (HCC) 03/27/2013   Cough 03/12/2013   ED (erectile dysfunction) 12/28/2011   Neoplasm of uncertain behavior of skin 12/27/2010   Exertional chest pain 01/05/2010   Essential hypertension  04/08/2007   HYPERTRIGLYCERIDEMIA 01/08/2007   Disorder of bilirubin excretion 01/08/2007   Ulcerative colitis (HCC) 01/08/2007    ONSET DATE: 07/25/2022   REFERRING DIAG: I61.9 (ICD-10-CM) - Intraparenchymal hemorrhage of brain (HCC)   THERAPY DIAG:  Cognitive communication deficit  Intraparenchymal hemorrhage of brain (HCC)  Rationale for Evaluation and Treatment Rehabilitation  SUBJECTIVE:   PERTINENT HISTORY and DIAGNOSTIC FINDINGS: Pt is a right handed 77 year old male with past medical history of HLD, HTN, CAD with recent intra parenchymal hemorrhage on 07/25/2022. MRI (07/26/2022) revealed "known acute hemorrhage in the left thalamus measuring up to 2 cm and chronic small vessel ischemia with chronic microhemorrhages."   PAIN:  Are you having pain? No   FALLS: Has patient fallen in last 6 months?  No  LIVING ENVIRONMENT: Lives with: lives with their spouse Lives in: House/apartment  PLOF:  Level of assistance: Independent with ADLs, Independent with IADLs Employment: Full-time employment   PATIENT GOALS   to get better  SUBJECTIVE STATEMENT: "December is going to be my last month working" Pt accompanied by: himself  OBJECTIVE:   TODAY'S TREATMENT:  Skilled ST session focused on pt's cognitive communication goals. SLP facilitated the session by providing the following interventions:  Pt reports that he has started a job with work, able to independently sequence, organize verbal process (past and future needs of project)  PATIENT EDUCATION: Education details: see above Person educated: Patient, Education method: Explanation Education comprehension: needs further education  HOME EXERCISE PROGRAM:  Complete daily schedule  Send text to list of ushers for upcoming ushers  GOALS:  Goals reviewed with patient? Yes  SHORT TERM GOALS: Target date: 10 sessions  Updated 10/04/2022  Updated 11/21/2022   2.  Pt will report improved cognitive  communication via PROM by 5 points at last ST session    Baseline:  Goal status: INITIAL; progress made  3.   With Min A, patient will recall functional verbal information 80% accuracy with use of compensations/strategies (repeats, rephrasing, notetaking, etc).   Baseline:  Goal status: INITIAL; MET; upgraded to  With supervision A, patient will recall functional verbal information 80% accuracy with use of compensations/strategies (repeats, rephrasing, notetaking, etc): progress made - newly introduced compensatory aid  4.  With Min A, patient will generate sentences with 3 or more words in response to a situation at 95% accuracy in order to increase ability to communicate basic wants and needs.    Baseline:  Goal status: INITIAL: MET upgraded to With supervision A, patient will generate sentences with 3 or more words in response to a situation at 95% accuracy in order to increase ability to communicate basic wants and needs.: while progress has been made, pt requires more than a reasonable amount of time  LONG TERM GOALS: Target date: 01/30/2023  Updated: 11/21/2022 With Supervision A, patient will participate in complex conversation at 90% accuracy to increase ability to communicate complex thoughts, feelings, and needs.  Baseline:  Goal status: INITIAL: ongoing: pt continues with delayed response times  2.   With Supervision A, patient will demonstrate strategies for processing/recall (eg notetaking, paraphrasing) successfully by recalling >90% of details from 15 minutes verbal information.   Baseline:  Goal status: INITIAL: ongoing: progress made, newly introduce compensatory aid  ASSESSMENT:  CLINICAL IMPRESSION: Patient is a 77 y.o. right handed male who was seen today for cognitive communication treatment session d/t recent left thalamic hemorrhage. 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 See  skilled treatment note for details.   OBJECTIVE IMPAIRMENTS include memory, executive functioning, expressive language, and aphasia. These impairments are limiting patient from return to work, managing medications, managing appointments, managing finances, household responsibilities, ADLs/IADLs, and effectively communicating at home and in community. Factors affecting potential to achieve goals and functional outcome are  N/A .Marland Kitchen Patient will benefit from skilled SLP services to address above impairments and improve overall function.  REHAB POTENTIAL: Excellent  PLAN: SLP FREQUENCY: 1-2x/week  SLP DURATION: 12 weeks  PLANNED INTERVENTIONS: Language facilitation, Cueing hierachy, Cognitive reorganization, Internal/external aids, Functional tasks, SLP instruction and feedback, Compensatory strategies, and Patient/family education   Lahna Nath B. Dreama Saa, M.S., CCC-SLP, CBIS Speech-Language Pathologist Certified Brain Injury Specialist [facility]  [facility] [place] [phone] Ascom [phone] Fax [phone]          ++

## 2022-12-31 ENCOUNTER — Ambulatory Visit: Payer: PPO | Admitting: Speech Pathology

## 2022-12-31 ENCOUNTER — Encounter: Payer: PPO | Admitting: Occupational Therapy

## 2022-12-31 ENCOUNTER — Ambulatory Visit: Payer: PPO | Admitting: Physical Therapy

## 2023-01-02 ENCOUNTER — Ambulatory Visit: Payer: PPO | Admitting: Physical Therapy

## 2023-01-02 ENCOUNTER — Encounter: Payer: PPO | Admitting: Occupational Therapy

## 2023-01-02 ENCOUNTER — Ambulatory Visit: Payer: PPO | Admitting: Speech Pathology

## 2023-01-07 ENCOUNTER — Ambulatory Visit: Payer: PPO | Admitting: Speech Pathology

## 2023-01-07 ENCOUNTER — Encounter: Payer: PPO | Admitting: Occupational Therapy

## 2023-01-07 ENCOUNTER — Ambulatory Visit: Payer: PPO | Admitting: Physical Therapy

## 2023-01-09 ENCOUNTER — Encounter: Payer: PPO | Admitting: Occupational Therapy

## 2023-01-09 ENCOUNTER — Ambulatory Visit: Payer: PPO | Admitting: Physical Therapy

## 2023-01-09 ENCOUNTER — Ambulatory Visit: Payer: PPO | Admitting: Speech Pathology

## 2023-01-09 DIAGNOSIS — I619 Nontraumatic intracerebral hemorrhage, unspecified: Secondary | ICD-10-CM

## 2023-01-09 DIAGNOSIS — R41841 Cognitive communication deficit: Secondary | ICD-10-CM | POA: Diagnosis not present

## 2023-01-09 NOTE — Therapy (Signed)
OUTPATIENT SPEECH LANGUAGE PATHOLOGY  TREATMENT NOTE DISCHARGE SUMMARY   Patient Name: Billy Kim MRN: [id number] DOB:1945-09-10, 77 y.o., male Today's Date: 01/09/2023  PCP: Crawford Givens, MD REFERRING PROVIDER: Claudette Laws, MD     End of Session - 01/09/23 0929     Visit Number 30    Number of Visits 41    Date for SLP Re-Evaluation 01/30/23    Authorization Type [employer]    Progress Note Due on Visit 30    SLP Start Time 0930    SLP Stop Time  1015    SLP Time Calculation (min) 45 min    Activity Tolerance Patient tolerated treatment well            No past medical history on file. The histories are not reviewed yet. Please review them in the "History" navigator section and refresh this SmartLink. Patient Active Problem List   Diagnosis Date Noted   Healthcare maintenance 11/14/2022   Atypical chest pain 11/14/2022   Sinusitis 09/20/2022   Intraparenchymal hemorrhage of brain (HCC) 07/31/2022   AKI (acute kidney injury) (HCC) 07/27/2022   Hypertensive emergency 07/27/2022   Nontraumatic subcortical hemorrhage of left cerebral hemisphere (HCC) 07/25/2022   Weakness 07/20/2022   Leg swelling 07/20/2022   Dizziness 11/03/2021   Actinic keratosis 08/07/2021   Melanocytic nevi of trunk 08/07/2021   Nevus of back 08/07/2021   Personal history of other malignant neoplasm of skin 08/07/2021   Medicare annual wellness visit, subsequent 11/09/2020   Coronary artery disease of native artery of native heart with stable angina pectoris (HCC) 12/17/2019   Hyperlipidemia LDL goal <70 12/17/2019   Accelerating angina (HCC) 12/11/2019   Shingles 03/01/2019   FH: prostate cancer 02/21/2017   Advance care planning 03/31/2014   PSA elevation 03/27/2013   Thrombocytopenia, unspecified (HCC) 03/27/2013   Cough 03/12/2013   ED (erectile dysfunction) 12/28/2011   Neoplasm of uncertain behavior of skin 12/27/2010   Exertional chest pain 01/05/2010    Essential hypertension 04/08/2007   HYPERTRIGLYCERIDEMIA 01/08/2007   Disorder of bilirubin excretion 01/08/2007   Ulcerative colitis (HCC) 01/08/2007    ONSET DATE: 07/25/2022   REFERRING DIAG: I61.9 (ICD-10-CM) - Intraparenchymal hemorrhage of brain (HCC)   THERAPY DIAG:  Cognitive communication deficit  Intraparenchymal hemorrhage of brain (HCC)  Rationale for Evaluation and Treatment Rehabilitation  SUBJECTIVE:   PERTINENT HISTORY and DIAGNOSTIC FINDINGS: Pt is a right handed 77 year old male with past medical history of HLD, HTN, CAD with recent intra parenchymal hemorrhage on 07/25/2022. MRI (07/26/2022) revealed "known acute hemorrhage in the left thalamus measuring up to 2 cm and chronic small vessel ischemia with chronic microhemorrhages."   PAIN:  Are you having pain? No   FALLS: Has patient fallen in last 6 months?  No  LIVING ENVIRONMENT: Lives with: lives with their spouse Lives in: House/apartment  PLOF:  Level of assistance: Independent with ADLs, Independent with IADLs Employment: Full-time employment   PATIENT GOALS   to get better  SUBJECTIVE STATEMENT: Pt missed 1 week of therapy as he was working with his son Pt accompanied by: himself  OBJECTIVE:   TODAY'S TREATMENT:  Skilled ST session focused on pt's cognitive communication goals. SLP facilitated the session by providing the following interventions:  Pt stated that the work he did with his son went very well.   Pt has made great progress towards his goals and the following were administered to objectify pt's progress.   Mini-Addenbrooke's Cognitive Examiniation (Mini-ACE), a total  score of 30, with generally accepted cut-off-points for potential dementia diagnosis being a score of 21 or 25 or below, meaning a lower score indicated greater cognitive impairment, hgigher score represent better cognitive function. A score below 21 is considered highly suggestive of dementia, while a score below 25  may indicate a potential cognitive impairment.  Version: B  TOTAL SCORE: 26 Pt's score is considered to be above the cut-off.    The Neuro-QOLT Item Bank v2.0-Cognition Function-Short Form is an eight-item test designed to measure difficulties with cognitive functioning (e.g., memory, attention and decision making or in the application of such abilities to everyday tasks (e.g., planning, organizing, calculating, remembering and learning). Source: Duwaine Maxin, J-S, et al. (2012). Neuro-QOL: brief measures of health-related quality of life for clinical research in neurology. Neurology, 78(23), [phone].   In the past 7 days...  I had to read something several times to understand it. 5- Never My thinking was slow. 2- Often (once a day)  I had to work really hard to pay attention or I would make a mistake. 3- Sometimes (2-3 times)  I had trouble concentrating. 3- Sometimes (2-3 times)   How much DIFFICULTY do you currently have...  Reading and following complex instructions (e.g., directions for a new medication)? 5- None  Planning for and keeping appointments that are not part of your weekly routine (e.g., a therapy or doctor appointment, or a social gathering with friends and family)? 4- A little  Managing your time to do most of your daily activities? 4- A little  Learning new tasks or instructions? 4- A little   T-SCORE: 45;  half SD below mean of 50   PATIENT EDUCATION: Education details: see above Person educated: Patient, Education method: Explanation Education comprehension: needs further education  HOME EXERCISE PROGRAM:        Complete daily schedule  Send text to list of ushers for upcoming ushers  GOALS:  Goals reviewed with patient? Yes  SHORT TERM GOALS: Target date: 10 sessions  Updated 10/04/2022  Updated 11/21/2022  Updated 01/09/2023   2.  Pt will report improved cognitive communication via PROM by 5 points at last ST session    Baseline:  Goal status:  INITIAL; progress made: MET  3.   With Min A, patient will recall functional verbal information 80% accuracy with use of compensations/strategies (repeats, rephrasing, notetaking, etc).   Baseline:  Goal status: INITIAL; MET; upgraded to  With supervision A, patient will recall functional verbal information 80% accuracy with use of compensations/strategies (repeats, rephrasing, notetaking, etc): progress made - newly introduced compensatory aid: MET  4.  With Min A, patient will generate sentences with 3 or more words in response to a situation at 95% accuracy in order to increase ability to communicate basic wants and needs.    Baseline:  Goal status: INITIAL: MET upgraded to With supervision A, patient will generate sentences with 3 or more words in response to a situation at 95% accuracy in order to increase ability to communicate basic wants and needs.: while progress has been made, pt requires more than a reasonable amount of time: MET  LONG TERM GOALS: Target date: 01/30/2023  Updated: 11/21/2022 With Supervision A, patient will participate in complex conversation at 90% accuracy to increase ability to communicate complex thoughts, feelings, and needs.  Baseline:  Goal status: INITIAL: ongoing: pt continues with delayed response times: MET  2.   With Supervision A, patient will demonstrate strategies for processing/recall (eg notetaking, paraphrasing) successfully  by recalling >90% of details from 15 minutes verbal information.   Baseline:  Goal status: INITIAL: ongoing: progress made, newly introduce compensatory aid: MET  ASSESSMENT:  CLINICAL IMPRESSION: Patient is a 77 y.o. right handed male who was seen today for cognitive communication treatment session d/t recent left thalamic hemorrhage. Pt has made progress over the course of skilled ST and has a result has met his goals. He continues to present with some deficits in memory that are related to baseline deficits but he is able to  complete ADLs and iADLs independently. All education has been completed and pt is appropriate for discharge from services.   See skilled treatment note for details.    PLAN:  Pt is appropriate for discharge from services.   Dionysios Massman B. Dreama Saa, M.S., CCC-SLP, CBIS Speech-Language Pathologist Certified Brain Injury Specialist [facility]  [facility] [phone] Ascom [phone] Fax [phone]          ++

## 2023-01-14 ENCOUNTER — Ambulatory Visit: Payer: PPO | Admitting: Speech Pathology

## 2023-01-14 ENCOUNTER — Ambulatory Visit: Payer: PPO | Admitting: Physical Therapy

## 2023-01-14 ENCOUNTER — Encounter: Payer: PPO | Admitting: Occupational Therapy

## 2023-01-16 ENCOUNTER — Ambulatory Visit: Payer: PPO | Admitting: Physical Therapy

## 2023-01-16 ENCOUNTER — Encounter: Payer: PPO | Admitting: Speech Pathology

## 2023-01-16 ENCOUNTER — Encounter: Payer: PPO | Admitting: Occupational Therapy

## 2023-01-21 ENCOUNTER — Ambulatory Visit: Payer: PPO | Admitting: Physical Therapy

## 2023-01-21 ENCOUNTER — Ambulatory Visit: Payer: PPO | Admitting: Speech Pathology

## 2023-01-21 DIAGNOSIS — I619 Nontraumatic intracerebral hemorrhage, unspecified: Secondary | ICD-10-CM | POA: Diagnosis not present

## 2023-01-21 DIAGNOSIS — H2513 Age-related nuclear cataract, bilateral: Secondary | ICD-10-CM | POA: Diagnosis not present

## 2023-01-21 DIAGNOSIS — H353131 Nonexudative age-related macular degeneration, bilateral, early dry stage: Secondary | ICD-10-CM | POA: Diagnosis not present

## 2023-01-23 ENCOUNTER — Encounter: Payer: PPO | Admitting: Occupational Therapy

## 2023-01-23 ENCOUNTER — Ambulatory Visit: Payer: PPO | Admitting: Physical Therapy

## 2023-01-23 ENCOUNTER — Encounter: Payer: PPO | Admitting: Speech Pathology

## 2023-01-24 ENCOUNTER — Encounter: Payer: PPO | Attending: Physical Medicine & Rehabilitation | Admitting: Physical Medicine & Rehabilitation

## 2023-01-24 ENCOUNTER — Encounter: Payer: Self-pay | Admitting: Physical Medicine & Rehabilitation

## 2023-01-24 VITALS — BP 129/74 | HR 60 | Ht 67.0 in | Wt 182.0 lb

## 2023-01-24 DIAGNOSIS — I69319 Unspecified symptoms and signs involving cognitive functions following cerebral infarction: Secondary | ICD-10-CM | POA: Diagnosis not present

## 2023-01-24 NOTE — Patient Instructions (Signed)
If you would like tingling fingers assessed please call to schedule EMG/NCV

## 2023-01-24 NOTE — Progress Notes (Signed)
Subjective:    Patient ID: Billy Kim, male    DOB: 1945/05/16, 77 y.o.   MRN: [id number] 77 y.o. right-handed male with history of CAD maintained on low-dose aspirin hyperlipidemia hypertension ulcerative colitis that is in remission.  Per chart review lives with spouse independent prior to admission working full-time.  Presented to [facility] 07/25/2022 with word finding difficulty and gait instability.  Admission chemistries unremarkable except BUN 26 creatinine 1.42 total bilirubin 2.4.  Cranial CT scan sho wed acute intraparenchymal hemorrhage in the left thalamus measuring 1.8 x 1.4 x 1.2 cm.  No midline shift or mass effect.  CT angiogram of the head showed no aneurysm or vascular malformation.  MRI follow-up no mass or unexpected findings.  Initially on Cleviprex for blood pressure control.  Neurosurgery as well as neurology consulted for follow-up with conservative care.  Echocardiogram ejection fraction 60 to 65% no wall motion abnormalities grade 1 diastolic dysfunction.  Tolerating a regular consistency diet.  Therapy evaluations completed and patient was admitted for a comprehensive rehab program due to decreased functional mobility.  Admit date: 07/31/2022 Discharge date: 08/07/2022 HPI 77 year old male with left thalamic hemorrhage approximately 6 months ago with residual complaints of decreased balance, decreased memory, decreased attention as well as numbness of both hands. He has completed inpatient rehabilitation at [facility] and recently finished outpatient speech therapy.  He had finished OT and PT at the end of August. Discussed 10lb lifting restriction , no ladders, no going into crawl spaces.  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  He has done some service calls and replaced a small motor weighing 3 or 4 pounds recently.  His wife has gone with him and states that when he is working he is like his "old self" He has been doing some driving mainly short distances  but had gone up to 15 miles.  We discussed that the distance is not as important in terms of his navigation abilities as familiarity with the area.   Some hearing issues, memory issues , since CVA  No falls since last visit. Patient denies any pain complaints.  No recent injuries   Pain Inventory Average Pain 0 Pain Right Now 0 My pain is  No Pain    Family History  Problem Relation Age of Onset   Cancer Mother        ? GYN, died after hysterectomy   Stroke Father    CAD Father    Hypertension Sister    Prostate cancer Brother    CAD Brother 76       CABG   Bladder Cancer Brother 54       Bladder   Hypertension Brother    Hypertension Brother    Hypertension Brother    Hypertension Brother    Hypertension Brother    Bone cancer Paternal Grandfather    Hypertension Son    Prostate cancer Nephew    Bladder Cancer Nephew    Colon cancer Neg Hx    Esophageal cancer Neg Hx    Rectal cancer Neg Hx    Stomach cancer Neg Hx    Social History   Socioeconomic History   Marital status: Married    Spouse name: Not on file   Number of children: 1   Years of education: Not on file   Highest education level: Not on file  Occupational History   Occupation: OWNER    Employer: SELF    Comment: [employer] business  Tobacco Use   Smoking status: Never   Smokeless tobacco: Never  Vaping Use   Vaping status: Never Used  Substance and Sexual Activity   Alcohol use: No   Drug use: No   Sexual activity: Never  Other Topics Concern   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   Social Determinants of Health   Financial Resource Strain: Low Risk  (12/24/2022)   Overall Financial Resource Strain (CARDIA)    Difficulty of Paying Living Expenses: Not hard at all  Food Insecurity: No Food Insecurity (12/24/2022)   Hunger Vital Sign    Worried About Running Out of Food in the  Last Year: Never true    Ran Out of Food in the Last Year: Never true  Transportation Needs: No Transportation Needs (12/24/2022)   PRAPARE - Administrator, Civil Service (Medical): No    Lack of Transportation (Non-Medical): No  Physical Activity: Insufficiently Active (12/24/2022)   Exercise Vital Sign    Days of Exercise per Week: 1 day    Minutes of Exercise per Session: 30 min  Stress: No Stress Concern Present (12/24/2022)   [employer] of Occupational Health - Occupational Stress Questionnaire    Feeling of Stress : Not at all  Social Connections: Moderately Integrated (12/24/2022)   Social Connection and Isolation Panel [NHANES]    Frequency of Communication with Friends and Family: Three times a week    Frequency of Social Gatherings with Friends and Family: Three times a week    Attends Religious Services: More than 4 times per year    Active Member of Clubs or Organizations: No    Attends Banker Meetings: Never    Marital Status: Married   Past Surgical History:  Procedure Laterality Date   CARDIAC CATHETERIZATION  03/20/01   Cardiolite EF 55% 02/10/02   CHOLECYSTECTOMY N/A 07/09/2017   Procedure: LAPAROSCOPIC CHOLECYSTECTOMY;  Surgeon: Jimmye Norman, MD;  Location: [facility] OR;  Service: General;  Laterality: N/A;   COLONOSCOPY  multiple   ENDOSCOPIC RETROGRADE CHOLANGIOPANCREATOGRAPHY (ERCP) WITH PROPOFOL N/A 07/08/2017   Procedure: ENDOSCOPIC RETROGRADE CHOLANGIOPANCREATOGRAPHY (ERCP) WITH PROPOFOL;  Surgeon: Lynann Bologna, MD;  Location: [facility] ENDOSCOPY;  Service: Endoscopy;  Laterality: N/A;   INGUINAL HERNIA REPAIR  04/09/06   Bilateral   LAPAROSCOPIC APPENDECTOMY  03/1981   LEFT HEART CATH AND CORONARY ANGIOGRAPHY Left 12/11/2019   Procedure: LEFT HEART CATH AND CORONARY ANGIOGRAPHY;  Surgeon: Yvonne Kendall, MD;  Location: [facility] INVASIVE CV LAB;  Service: Cardiovascular;  Laterality: Left;   REMOVAL OF STONES  07/08/2017   Procedure: REMOVAL OF  STONES;  Surgeon: Lynann Bologna, MD;  Location: [facility] ENDOSCOPY;  Service: Endoscopy;;   SPHINCTEROTOMY  07/08/2017   Procedure: Dennison Mascot;  Surgeon: Lynann Bologna, MD;  Location: [facility] ENDOSCOPY;  Service: Endoscopy;;   Past Surgical History:  Procedure Laterality Date   CARDIAC CATHETERIZATION  03/20/01   Cardiolite EF 55% 02/10/02   CHOLECYSTECTOMY N/A 07/09/2017   Procedure: LAPAROSCOPIC CHOLECYSTECTOMY;  Surgeon: Jimmye Norman, MD;  Location: [facility] OR;  Service: General;  Laterality: N/A;   COLONOSCOPY  multiple   ENDOSCOPIC RETROGRADE CHOLANGIOPANCREATOGRAPHY (ERCP) WITH PROPOFOL N/A 07/08/2017   Procedure: ENDOSCOPIC RETROGRADE CHOLANGIOPANCREATOGRAPHY (ERCP) WITH PROPOFOL;  Surgeon: Lynann Bologna, MD;  Location: [facility] ENDOSCOPY;  Service: Endoscopy;  Laterality: N/A;   INGUINAL HERNIA REPAIR  04/09/06   Bilateral   LAPAROSCOPIC APPENDECTOMY  03/1981  LEFT HEART CATH AND CORONARY ANGIOGRAPHY Left 12/11/2019   Procedure: LEFT HEART CATH AND CORONARY ANGIOGRAPHY;  Surgeon: Yvonne Kendall, MD;  Location: [facility] INVASIVE CV LAB;  Service: Cardiovascular;  Laterality: Left;   REMOVAL OF STONES  07/08/2017   Procedure: REMOVAL OF STONES;  Surgeon: Lynann Bologna, MD;  Location: [facility] ENDOSCOPY;  Service: Endoscopy;;   SPHINCTEROTOMY  07/08/2017   Procedure: Dennison Mascot;  Surgeon: Lynann Bologna, MD;  Location: [facility] ENDOSCOPY;  Service: Endoscopy;;   Past Medical History:  Diagnosis Date   Coronary artery disease    a. 11/2019 Cath: Moderate proximal LAD disease (not hemodynamically significant) and CTO's of D1 and non-dominant RCA.   HLD (hyperlipidemia)    Hypertension    Intracerebral hemorrhage (HCC)    a. 07/2022 CT Head: acute intraparenchymal hemorrhage in the left thalamus without midline shift or mass effect.   Systolic murmur    a. 07/2022 Echo: EF 60-65%, no rwma, GrI DD, nl RV fxn, RVSP 15.56mmHg, no significant valvular disease.   Ulcerative colitis 06/1979   Remission for years   BP (!)  147/80   Pulse 62   Ht 5\' 7"  (1.702 m)   Wt 182 lb (82.6 kg)   SpO2 96%   BMI 28.51 kg/m   Opioid Risk Score:   Fall Risk Score:  `1  Depression screen [facility] 2/9     01/24/2023   12:41 PM 12/24/2022    2:39 PM 11/13/2022    9:47 AM 10/11/2022    2:16 PM 10/04/2022   10:57 AM 09/26/2022   12:00 PM 08/21/2022    3:25 PM  Depression screen PHQ 2/9  Decreased Interest 0 0 0 0 0 0 1  Down, Depressed, Hopeless 0 0 0 0 0 0 0  PHQ - 2 Score 0 0 0 0 0 0 1  Altered sleeping  0 0  0 0 0  Tired, decreased energy  0 0  0 0 2  Change in appetite  0 0  0 0 0  Feeling bad or failure about yourself   0 0  0 0 1  Trouble concentrating  0 0  0 0 2  Moving slowly or fidgety/restless  0 0  0 0 3  Suicidal thoughts  0 0  0 0 0  PHQ-9 Score  0 0  0 0 9  Difficult doing work/chores  Not difficult at all Not difficult at all  Not difficult at all Not difficult at all       Review of Systems  All other systems reviewed and are negative.     Objective:   Physical Exam  General No acute distress Mood and affect are appropriate Extremities without edema Motor strength is 5/5 bilateral deltoid, bicep, tricep, grip, hip flexor, knee extensor, ankle dorsiflexor and plantar flexor Sensation reduced to pinprick and light touch in digits 2 through 5 bilaterally in the upper extremities sparing the thumb normal sensation at the wrist and above. Ambulates without assistive device no evidence of toe drag or knee instability he is able to toe walk. Romberg is negative       Assessment & Plan:  #1.  History of left thalamic bleed.  Physically he has improved to a great degree still has some mild balance deficits that he notes.  We recommend no ladder climbing no crawlspace entry and no carrying greater than 10 pounds. From a cognitive standpoint still is not back to where he was prestroke.  We discussed that he may not  ever regain his prior cognitive abilities however he is still in the timeframe of further  recovery.  He would like to get some further assessment by neuropsychology make a referral to Dr. Kieth Brightly. I will see him back on a as needed basis he will follow-up with neurology as well as primary care We discussed his work activities in detail with his wife who is with him as well today. Greater than half of the 30-minute visit was spent counseling and coordinating care

## 2023-01-28 ENCOUNTER — Encounter: Payer: PPO | Admitting: Occupational Therapy

## 2023-01-28 ENCOUNTER — Encounter: Payer: PPO | Admitting: Speech Pathology

## 2023-01-28 ENCOUNTER — Ambulatory Visit: Payer: PPO | Admitting: Physical Therapy

## 2023-01-29 ENCOUNTER — Telehealth: Payer: Self-pay | Admitting: Internal Medicine

## 2023-01-29 DIAGNOSIS — R079 Chest pain, unspecified: Secondary | ICD-10-CM

## 2023-01-29 MED ORDER — NITROGLYCERIN 0.4 MG SL SUBL: 0.4000 mg | SUBLINGUAL_TABLET | SUBLINGUAL | 1 refills | Status: DC | PRN

## 2023-01-29 NOTE — Telephone Encounter (Signed)
*  STAT* If patient is at the pharmacy, call can be transferred to refill team.   1. Which medications need to be refilled? (please list name of each medication and dose if known) nitroGLYCERIN (NITROSTAT) 0.4 MG SL tablet  2. Which pharmacy/location (including street and city if local pharmacy) is medication to be sent to? [facility] [place], [place]  3. Do they need a 30 day or 90 day supply? [place]

## 2023-01-30 ENCOUNTER — Encounter: Payer: PPO | Admitting: Occupational Therapy

## 2023-01-30 ENCOUNTER — Encounter: Payer: PPO | Admitting: Speech Pathology

## 2023-01-30 ENCOUNTER — Ambulatory Visit: Payer: PPO | Admitting: Physical Therapy

## 2023-01-31 DIAGNOSIS — R972 Elevated prostate specific antigen [PSA]: Secondary | ICD-10-CM | POA: Diagnosis not present

## 2023-02-04 ENCOUNTER — Ambulatory Visit: Payer: PPO | Admitting: Physical Therapy

## 2023-02-04 ENCOUNTER — Encounter: Payer: PPO | Admitting: Occupational Therapy

## 2023-02-04 ENCOUNTER — Encounter: Payer: PPO | Admitting: Speech Pathology

## 2023-02-06 ENCOUNTER — Encounter: Payer: PPO | Admitting: Occupational Therapy

## 2023-02-06 ENCOUNTER — Encounter: Payer: PPO | Admitting: Speech Pathology

## 2023-02-06 ENCOUNTER — Ambulatory Visit: Payer: PPO | Admitting: Physical Therapy

## 2023-02-11 ENCOUNTER — Encounter: Payer: PPO | Admitting: Speech Pathology

## 2023-02-11 ENCOUNTER — Encounter: Payer: PPO | Admitting: Occupational Therapy

## 2023-02-11 ENCOUNTER — Ambulatory Visit: Payer: PPO | Admitting: Physical Therapy

## 2023-02-18 ENCOUNTER — Encounter: Payer: PPO | Admitting: Speech Pathology

## 2023-02-21 ENCOUNTER — Encounter: Payer: PPO | Admitting: Speech Pathology

## 2023-02-25 ENCOUNTER — Ambulatory Visit: Payer: PPO | Admitting: Internal Medicine

## 2023-02-26 ENCOUNTER — Encounter: Payer: PPO | Admitting: Speech Pathology

## 2023-02-26 DIAGNOSIS — D225 Melanocytic nevi of trunk: Secondary | ICD-10-CM | POA: Diagnosis not present

## 2023-02-26 DIAGNOSIS — Z08 Encounter for follow-up examination after completed treatment for malignant neoplasm: Secondary | ICD-10-CM | POA: Diagnosis not present

## 2023-02-26 DIAGNOSIS — L814 Other melanin hyperpigmentation: Secondary | ICD-10-CM | POA: Diagnosis not present

## 2023-02-26 DIAGNOSIS — L821 Other seborrheic keratosis: Secondary | ICD-10-CM | POA: Diagnosis not present

## 2023-02-26 DIAGNOSIS — L570 Actinic keratosis: Secondary | ICD-10-CM | POA: Diagnosis not present

## 2023-02-26 DIAGNOSIS — Z85828 Personal history of other malignant neoplasm of skin: Secondary | ICD-10-CM | POA: Diagnosis not present

## 2023-03-01 ENCOUNTER — Encounter: Payer: PPO | Admitting: Speech Pathology

## 2023-03-05 ENCOUNTER — Encounter: Payer: PPO | Admitting: Speech Pathology

## 2023-03-08 ENCOUNTER — Encounter: Payer: PPO | Admitting: Speech Pathology

## 2023-03-12 ENCOUNTER — Encounter: Payer: PPO | Admitting: Speech Pathology

## 2023-03-15 ENCOUNTER — Encounter: Payer: PPO | Admitting: Speech Pathology

## 2023-03-19 ENCOUNTER — Encounter: Payer: PPO | Admitting: Speech Pathology

## 2023-03-22 ENCOUNTER — Encounter: Payer: PPO | Admitting: Speech Pathology

## 2023-04-11 ENCOUNTER — Ambulatory Visit: Payer: PPO | Admitting: Neurology

## 2023-04-15 ENCOUNTER — Encounter: Payer: Self-pay | Admitting: Internal Medicine

## 2023-04-15 ENCOUNTER — Ambulatory Visit: Payer: PPO | Attending: Internal Medicine | Admitting: Internal Medicine

## 2023-04-15 VITALS — BP 130/78 | HR 57 | Ht 67.0 in | Wt 187.6 lb

## 2023-04-15 DIAGNOSIS — I10 Essential (primary) hypertension: Secondary | ICD-10-CM | POA: Diagnosis not present

## 2023-04-15 DIAGNOSIS — I619 Nontraumatic intracerebral hemorrhage, unspecified: Secondary | ICD-10-CM

## 2023-04-15 DIAGNOSIS — T466X5A Adverse effect of antihyperlipidemic and antiarteriosclerotic drugs, initial encounter: Secondary | ICD-10-CM | POA: Diagnosis not present

## 2023-04-15 DIAGNOSIS — G720 Drug-induced myopathy: Secondary | ICD-10-CM

## 2023-04-15 DIAGNOSIS — E785 Hyperlipidemia, unspecified: Secondary | ICD-10-CM | POA: Diagnosis not present

## 2023-04-15 DIAGNOSIS — Z79899 Other long term (current) drug therapy: Secondary | ICD-10-CM

## 2023-04-15 DIAGNOSIS — I25118 Atherosclerotic heart disease of native coronary artery with other forms of angina pectoris: Secondary | ICD-10-CM | POA: Diagnosis not present

## 2023-04-15 MED ORDER — NITROGLYCERIN 0.4 MG SL SUBL: 0.4000 mg | SUBLINGUAL_TABLET | SUBLINGUAL | 3 refills | Status: AC | PRN

## 2023-04-15 MED ORDER — LOSARTAN POTASSIUM 100 MG PO TABS: 100.0000 mg | ORAL_TABLET | Freq: Every day | ORAL | 3 refills | Status: DC

## 2023-04-15 MED ORDER — MECLIZINE HCL 25 MG PO TABS: ORAL_TABLET | ORAL | 1 refills | Status: DC

## 2023-04-15 NOTE — Patient Instructions (Signed)
 Medication Instructions: Your physician recommends the following medication changes.  STOP TAKING: Carvedilol   INCREASE: Losartan to 100 mg by mouth daily   *If you need a refill on your cardiac medications before your next appointment, please call your pharmacy*   Lab Work: Your provider would like for you to return in 2 weeks to have the following labs drawn: (BMP).   Please go to [facility] [address] ([facility] [address], [place] [address] You do not need an appointment.  They are open from 8 am- 4:30 pm.  Lunch from 1:00 pm- 2:00 pm You will not need to be fasting.   Testing/Procedures: No test ordered today    Follow-Up: At [facility], you and your health needs are our priority.  As part of our continuing mission to provide you with exceptional heart care, we have created designated Provider Care Teams.  These Care Teams include your primary Cardiologist (physician) and Advanced Practice Providers (APPs -  Physician Assistants and Nurse Practitioners) who all work together to provide you with the care you need, when you need it.  We recommend signing up for the patient portal called "MyChart".  Sign up information is provided on this After Visit Summary.  MyChart is used to connect with patients for Virtual Visits (Telemedicine).  Patients are able to view lab/test results, encounter notes, upcoming appointments, etc.  Non-urgent messages can be sent to your provider as well.   To learn more about what you can do with MyChart, go to [email or web address].    Your next appointment:   3 month(s)  Provider:   You may see Yvonne Kendall, MD or one of the following Advanced Practice Providers on your designated Care Team:   Nicolasa Ducking, NP Eula Listen, PA-C Cadence Fransico Michael, PA-C Charlsie Quest, NP Carlos Levering, NP

## 2023-04-15 NOTE — Progress Notes (Signed)
 Cardiology Office Note:  .   Date:  04/15/2023  ID:  Billy Kim, DOB Oct 01, 1945, MRN [id number] PCP: Joaquim Nam, MD  [facility] Providers Cardiologist:  Yvonne Kendall, MD     History of Present Illness: .   Billy Kim is a 78 y.o. male with history of coronary artery disease being managed medically (chronic total occlusions of D1 and nondominant RCA as well as moderate proximal LAD disease that was not significant by iFR/FFR), hemorrhagic stroke (07/2022) hypertension, and ulcerative colitis, who presents for follow-up of coronary artery disease and hemorrhagic stroke.  Today, Billy Kim reports that he has been doing fairly well though he is still having some speech and memory issues.  He has been referred to Dr. Virgia Land in [place] to assist with this.  He is also scheduled for follow-up with Dr. Pearlean Brownie he next week.  From a heart standpoint, Billy Kim has been feeling fairly well, denying chest pain, shortness of breath, and palpitations.  He notes that sometimes his socks leave small indentations on his legs though he otherwise has not had any significant edema.  He continues to have balance issues but has not fallen or passed out.  Home blood pressures are typically a bit elevated in the mornings with systolic readings usually around the 150 mmHg.  ROS: See HPI  Studies Reviewed: Marland Kitchen   EKG Interpretation Date/Time:  Monday April 15 2023 10:03:38 EST Ventricular Rate:  57 PR Interval:  220 QRS Duration:  126 QT Interval:  464 QTC Calculation: 451 R Axis:   -32  Text Interpretation: Sinus bradycardia with 1st degree A-V block Left axis deviation Right bundle branch block Minimal voltage criteria for LVH, may be normal variant ( R in aVL ) When compared with ECG of 20-Jul-2022 No significant change was found Confirmed by Raimundo Corbit ([id number]) on 04/15/2023 10:52:31 AM    TTE (07/26/2022): Normal LV size and wall thickness. LVEF 60-65% with normal wall motion. Grade 1  diastolic dysfunction. Normal RV size and function. Normal biatrial size. No pericardial effusion. No significant valvular abnormality. Normal CVP.   Risk Assessment/Calculations:             Physical Exam:   VS:  BP 130/78 (BP Location: Right Arm, Patient Position: Sitting)   Pulse (!) 57   Ht 5\' 7"  (1.702 m)   Wt 187 lb 9.6 oz (85.1 kg)   SpO2 97%   BMI 29.38 kg/m    Wt Readings from Last 3 Encounters:  04/15/23 187 lb 9.6 oz (85.1 kg)  01/24/23 182 lb (82.6 kg)  11/13/22 181 lb (82.1 kg)    General:  NAD.  Accompanied by his wife. Neck: No JVD or HJR. Lungs: Clear to auscultation bilaterally without wheezes or crackles. Heart: Regular rate and rhythm without murmurs, rubs, or gallops. Abdomen: Soft, nontender, nondistended. Extremities: No lower extremity edema.  ASSESSMENT AND PLAN: .    Coronary artery disease with stable angina and hyperlipidemia with statin myopathy: No angina reported.  Given mild sinus bradycardia with continued first-degree AV block, we have agreed to discontinue carvedilol.  Continue isosorbide mononitrate for antianginal therapy as well as ezetimibe in lieu of statin.  LDL at goal on the last check.  Hemorrhagic stroke: No new neurologic deficits reported, though some memory, speech, and balance issues persist.  Given borderline elevated blood pressure today in the office and higher readings at home, we have agreed to increase losartan 100 mg daily.  We will discontinue  carvedilol, as above.  Continue follow-up with Dr. Pearlean Brownie next week as planned.  I encouraged Billy Kim to ask Dr. Pearlean Brownie if/when it would be reasonable to resume low-dose aspirin therapy.  Hypertension: Blood pressure borderline today with target blood pressure below 130/80.  We will hold carvedilol in the setting of first-degree AV block and sinus bradycardia and increase losartan to 100 mg daily.  Billy Kim will return in about 2 weeks for a repeat basic metabolic panel.    Dispo:  Return to clinic in 3 months.  Signed, Yvonne Kendall, MD

## 2023-04-16 ENCOUNTER — Telehealth: Payer: Self-pay | Admitting: Neurology

## 2023-04-16 NOTE — Telephone Encounter (Signed)
 Pt's wife called to reschedule appointment cancelled due to bad weather. Added patient to the waitlist

## 2023-04-18 DIAGNOSIS — L821 Other seborrheic keratosis: Secondary | ICD-10-CM | POA: Diagnosis not present

## 2023-04-18 DIAGNOSIS — C44319 Basal cell carcinoma of skin of other parts of face: Secondary | ICD-10-CM | POA: Diagnosis not present

## 2023-04-18 DIAGNOSIS — L538 Other specified erythematous conditions: Secondary | ICD-10-CM | POA: Diagnosis not present

## 2023-04-18 DIAGNOSIS — D492 Neoplasm of unspecified behavior of bone, soft tissue, and skin: Secondary | ICD-10-CM | POA: Diagnosis not present

## 2023-04-29 DIAGNOSIS — Z79899 Other long term (current) drug therapy: Secondary | ICD-10-CM | POA: Diagnosis not present

## 2023-04-30 LAB — BASIC METABOLIC PANEL
BUN/Creatinine Ratio: 9 — ABNORMAL LOW (ref 10–24)
BUN: 13 mg/dL (ref 8–27)
CO2: 22 mmol/L (ref 20–29)
Calcium: 9 mg/dL (ref 8.6–10.2)
Chloride: 106 mmol/L (ref 96–106)
Creatinine, Ser: 1.41 mg/dL — ABNORMAL HIGH (ref 0.76–1.27)
Glucose: 104 mg/dL — ABNORMAL HIGH (ref 70–99)
Potassium: 4.3 mmol/L (ref 3.5–5.2)
Sodium: 143 mmol/L (ref 134–144)
eGFR: 51 mL/min/{1.73_m2} — ABNORMAL LOW (ref >59)

## 2023-05-08 ENCOUNTER — Encounter: Payer: Self-pay | Admitting: Psychology

## 2023-05-08 ENCOUNTER — Encounter: Attending: Physical Medicine & Rehabilitation | Admitting: Psychology

## 2023-05-08 DIAGNOSIS — I69319 Unspecified symptoms and signs involving cognitive functions following cerebral infarction: Secondary | ICD-10-CM

## 2023-05-08 NOTE — Progress Notes (Signed)
 NEUROPSYCHOLOGICAL EVALUATION [place]  Physical Medicine and Rehabilitation     Patient: Billy Kim  MRN: [id number] DOB: 03-12-1945  Age: 78 y.o. Sex: male  Race/Ethnicity: White or Caucasian  Years of Education: 12 Handedness: Right  Collateral Information Source: Wife Engineer, maintenance)  Referring Provider: Erick Colace, MD   Provider/Clinical Neuropsychologist: Thelma Comp, PsyD  Date of Service: 05/08/23 Start Time: 8:00 AM End Time: 10:00 AM  Location of Service:  [facility] Physical Medicine & Rehabilitation Department . [facility] [address], [place]. [address] [place], [place] [address] Phone: [phone]  Billing Code/Service:            96116/96121  Individuals Present: Patient was accompanied to the appointment by his wife, with patient permission, and visit was conducted by provider in provider's office. 1 hour and 15 minutes spent in face-to-face clinical interview and remaining 45 minutes was spent in record review, documentation, and testing protocol construction.    PATIENT CONSENT AND CONFIDENTIALITY  The patient's understanding of the reason for referral was intact. Discussed limits of confidentiality including, but not limited to, posting of final evaluation report in the patient's electronic medical record for both the patient and for the referring provider and appropriate medical professionals. Patient was given the opportunity to have their questions answered. The neuropsychological evaluation process was discussed with the patient and they consented to proceed with the evaluation.  Consent for Evaluation and Treatment: Signed: Yes Explanation of Privacy Policies: Signed: Yes Discussion of Confidentiality Limits: Yes REASON FOR REFERRAL  The patient is a 78 year old man was referred for neuropsychological evaluation to delineate his cognitive functioning within the context of a June 2024 left thalamic hemorrhage.  Per records, the presented to hospital with word finding difficulty and gait instability on 07/25/22. Cranial CT showed "acute intraparenchymal hemorrhage in the left thalamus measuring 1.8 x 1.4 x 1.2 cm. No midline shift or mass effect. CT angiogram of the head showed no aneurysm or vascular malformation. MRI follow-up no mass or unexpected findings." 07/26/22 MRI showed MRI (07/26/2022) revealed "known acute hemorrhage in the left thalamus measuring up to 2 cm and chronic small vessel ischemia with chronic microhemorrhages." The patient was admitted to rehab on 07/31/22 for inpatient therapies (PT, ST, and OT). Intraparenchymal hemorrhage remained stable with conservative care per notes. Cognitively, ST reported anomic aphasia. Cognitive strengths reported in auditory verbal comprehension, repetition, and naming. He discharged home. He met with outpatient PM&R for follow-up visits and underwent outpatient rehab with PT, OT, and ST. Per outpatient ST discharge summary (01/09/23), "Pt has made progress over the course of skilled ST and has a result has met his goals. He continues to present with some deficits in memory that are related to baseline deficits but he is able to complete ADLs and iADLs independently." Per records from an outpatient PM&R follow-up, Dr. Wynn Banker indicated the patient has had notable improvement in motor symptoms but some mild balance deficits are reported by the patient. Cognitively, the patient was reported to continue to experience residual symptoms which may persist.   Upon interview, the patient and his wife expressed interest in learning about any ways in which to help improve the patient's cognitive difficulties. Decisions regarding employment have largely been made. 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 The  patient indicated that he would like to be able to "a little" with respect to employment in the form of contract work servicing units.   HISTORY OF PRESENTING CONCERNS  Premorbid Cognitive Functioning: The patient was reported to be independent with basic and instrumental activities of daily living prior to the stroke. 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   Prior to the stroke, the patient indicated he has minor difficulties with short term memory, attention, and processing speed. His wife indicated she observed no notable cognitive difficulties prior to the stroke. She indicated that he has "always been a slow thinker, taking his time to think about how to respond when talking." He denied any indications suggestive of neurodevelopmental disorders in early life.    Current Cognitive Complaints: (Relative to premorbid baseline) Memory:  Patient - Endorsed difficulty with remembering conversations, recent events, misplacing things, and slight forgetfulness with remembering medications.  Collateral - Endorsed observing occasional (once or twice a week) difficulty remembering conversations and recent events. She denied observing problems with misplacing things, unintentionally repeating himself, or remembering medications.     Processing Speed:  Patient - Endorsed slightly slower processing speed.  Collateral - Consistent with patient report.   Attention & Concentration:  Patient - Endorsed problems primarily of losing train of thought and with working memory. No clear indications of problems in sustained attention generally.  Collateral - Reported some possible indications of problems with his attention, but this may be secondary to changes in language processing and slowed processing speed.   Language:  Patient - Endorsed difficulties with expressive language involving word-finding, and significant frustration with this issue. Described "some" trouble receptive language. Denied problems with  reading. Endorsed some hearing difficulties, but hearing evaluation is pending. Denied other expressive language deficits.  Collateral - Endorsed observing problems with word-finding. Denied observing paraphasic word-use errors, changes in syntax, or changes in basic writing composition.    Visual-Spatial:  Patient - Denied difficulties.  Collateral - Denied observing problems. 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    Executive Functioning:  Patient - Denied any problems or significant changes. Collateral - Denied observing any problems with decision making, problem solving, planning, organization, or changes in personality. Reported some reduced frustration tolerance with word-finding difficulties.      Motor/Sensory Complaints: The patient denied any problems or changes in sense of smell or taste. He has age related changes to vision but had a recent eye exam and updated his glasses. Wife and patient reported he is going to have his hearing tested in the future. The patient showed no clear indications of hearing loss in conversation during the interview (speaking softly, covered mouth to prevent lip reading). He denied problems with hearing related to reduced perception. The patient's balance was described as being 85% relative to his baseline, per his wife. One fall since since the stroke, but no injury. The reported dizziness upon starting a new heart medication. Patient and wife were encouraged to reach out to the prescribing physician if desired, given concern for fall.    (Tremor, balance, walking/gait, coordination.  Smell, taste, vision, hearing)  Emotional and Behavioral Functioning:  The patient denied any problems with frequent/difficulty to control worry, tension/restlessness, or other indications of anxiety. He denied problems with frequent feelings of low mood, anhedonia, suicidal ideation, or  other symptoms of depression. He denied any current or past homicidal ideation, hallucination, paranoia, symptoms of mania, or other  notable mental health problems. He denied any history of psychiatric symptoms earlier in life and has no mental health treatment history. He reported significant frustration with his word-finding difficulties and his wife added that he will frequently "give-up" and stop speaking when this occurs. They described some external stress related to the process of selling business assets to his son, something which started in December.   Sleep: He denied any problems with sleep onset. He occasionally has trouble getting back to sleep when waking during the night, but is not notably distressed by this. He sleeps around 9 hours per night, a slight increase from his historical 7 to 8, but this is within the context of having ended full time employment. No significant problems with fatigue were reported.  Appetite: "Good" Caffeine: Cappuccino (1 cup) 5x week.   Alcohol Use: None Tobacco Use: None  Recreational Substance Use: None   Level of Functional Independence: The patient is intact with basic activities of daily living.  Finances: Immediately following the stroke, his wife took over all book-keeping / Landscape architect of the business. Since then he has taking back over more responsibilities (writing checks, etc.) and his wife reported no major problems in this respect.    Shopping / Meal Preparation: Intact. Household Maintenance / Chores: Midwife / Future Obligations: Unchanged from baseline (managed by wife) Medication Management: Independent. Does not utilize pill-box. Reported minimal problems remembering medications.    Driving: Primarily drives with wife present. No significant problems noted.      Medical History/Record Review: HLD and HTN medication managed. Denied history of heart attack, cancer/chemotherapy, TBI, seizure, or indications  of sleep apnea.    Past Medical History:  Diagnosis Date   Coronary artery disease    a. 11/2019 Cath: Moderate proximal LAD disease (not hemodynamically significant) and CTO's of D1 and non-dominant RCA.   HLD (hyperlipidemia)    Hypertension    Intracerebral hemorrhage (HCC)    a. 07/2022 CT Head: acute intraparenchymal hemorrhage in the left thalamus without midline shift or mass effect.   Systolic murmur    a. 07/2022 Echo: EF 60-65%, no rwma, GrI DD, nl RV fxn, RVSP 15.44mmHg, no significant valvular disease.   Ulcerative colitis 06/1979   Remission for years    Patient Active Problem List   Diagnosis Date Noted   Statin myopathy 04/15/2023   Healthcare maintenance 11/14/2022   Atypical chest pain 11/14/2022   Sinusitis 09/20/2022   Intraparenchymal hemorrhage of brain (HCC) 07/31/2022   AKI (acute kidney injury) (HCC) 07/27/2022   Hypertensive emergency 07/27/2022   Nontraumatic subcortical hemorrhage of left cerebral hemisphere (HCC) 07/25/2022   Weakness 07/20/2022   Leg swelling 07/20/2022   Dizziness 11/03/2021   Actinic keratosis 08/07/2021   Melanocytic nevi of trunk 08/07/2021   Nevus of back 08/07/2021   Personal history of other malignant neoplasm of skin 08/07/2021   Medicare annual wellness visit, subsequent 11/09/2020   Coronary artery disease of native artery of native heart with stable angina pectoris (HCC) 12/17/2019   Hyperlipidemia LDL goal <70 12/17/2019   Accelerating angina (HCC) 12/11/2019   Shingles 03/01/2019   FH: prostate cancer 02/21/2017   Advance care planning 03/31/2014   PSA elevation 03/27/2013   Thrombocytopenia, unspecified (HCC) 03/27/2013   Cough 03/12/2013   ED (erectile dysfunction) 12/28/2011   Neoplasm of uncertain behavior of skin 12/27/2010   Exertional chest pain 01/05/2010   Essential hypertension 04/08/2007   HYPERTRIGLYCERIDEMIA 01/08/2007   Disorder of bilirubin excretion  01/08/2007   Ulcerative colitis (HCC) 01/08/2007     Imaging/Lab Results: Per records: Initial Imaging on 07/25/22 "EXAM: CT HEAD WITHOUT CONTRAST ... FINDINGS: Brain: Acute intraparenchymal hemorrhage in the left thalamus measuring 1.8 x 1.4 x 1.2 cm (approximately 1.5 mL). Minimal edema. No midline shift or other mass effect. The size and configuration of the ventricles and extra-axial CSF spaces are normal. The brain parenchyma is normal, without evidence of acute or chronic infarction. ... IMPRESSION: 1. Acute intraparenchymal hemorrhage in the left thalamus measuring 1.8 x 1.4 x 1.2 cm (approximately 1.5 mL). 2. No midline shift or other mass effect."  07/25/22 "EXAM: CT ANGIOGRAPHY HEAD ... IMPRESSION: 1. Normal CTA of the head.  No aneurysm or vascular malformation. 2. Unchanged appearance of left thalamic intraparenchymal hematoma, most consistent with hypertensive hemorrhage."   07/26/22 "EXAM: MRI HEAD WITHOUT AND WITH CONTRAST... COMPARISON: CT and CTA from yesterday.  ... FINDINGS: Brain: Known acute hemorrhage in the left thalamus measuring up to 2 cm. Faint rim of enhancement which is likely reactive, no spot sign or vascular lesion seen underlying the hematoma on recent CTA. No underlying masslike findings. There is a small rim of edema. No intraventricular extension.   Mild FLAIR hyperintensity in the cerebral white matter for age, attributed to chronic small vessel ischemia. There are chronic microhemorrhages primarily in the deep brain and best attributed to chronic hypertension. ... IMPRESSION: 1. No mass or other unexpected finding underlying the patient's left thalamic hematoma. 2. Chronic small vessel ischemia with chronic microhemorrhages."  07/27/22 "EXAM: CT HEAD WITHOUT CONTRAST... COMPARISON: CT head June 6, 24.... FINDINGS: Brain: Similar size of a left thalamic intraparenchymal hemorrhage. No progressive mass effect. No evidence of acute large vascular territory infarct, midline shift, or  hydrocephalus. ... IMPRESSION: Similar size of a left thalamic intraparenchymal hemorrhage. No progressive mass effect."  Family Neurologic/Medical Hx: Denied family history of dementia.    Medications: (Per records) acetaminophen (TYLENOL) 325 MG tablet Ascorbic Acid (VITAMIN C) 1000 MG tablet cholecalciferol (VITAMIN D3) 25 MCG (1000 UNIT) tablet ezetimibe (ZETIA) 10 MG tablet famotidine (PEPCID) 20 MG tablet fluticasone (FLONASE) 50 MCG/ACT nasal spray Garlic 500 MG CAPS isosorbide mononitrate (IMDUR) 120 MG 24 hr tablet losartan (COZAAR) 100 MG tablet meclizine (ANTIVERT) 25 MG tablet nitroGLYCERIN (NITROSTAT) 0.4 MG SL tablet Omega-3 Fatty Acids (FISH OIL) 1000 MG CAPS  Educational/Vocational History: Graduated high school. Typically earned Bs and Cs. He denied any problems with learning, behavior, or attention/concentration deficits. 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 His wife reported observing him trial some work since his stroke and that he performed very well. The patient and wife recognized that physically demanding work-tasks or ones with significant concern for safety would not be viable. As noted, he is selling business assets to his son who will effectively be taking over the family business.    Psychosocial: Marital Status: One prior marriage of 37 years. Married to current wife since 2010.  Children/Grandchildren: One son and three grandchildren.  Living Situation: Lives with wife.  Daily Activities/Hobbies: Remains involved in his church (head usher, Geophysicist/field seismologist super attendant, Biomedical engineer).   Behavioral Observations:Behavioral Observations: The patient was seen on an outpatient basis in the [facility] PM&R office for the clinical interview accompanied by his wife, with patient permission, who  provided collateral.  Orientation: The patient appeared fully oriented to self and purpose of the visit.  Ability to Participate in Interview: The patient readily answered all questions with appropriate levels of details. Attitude: Cooperative. Attention: No frank attentional lapses were noted. Conversational Language: Notable word-finding difficulties had some impact on fluency. His speech was slightly slow. Prosody and syntax were intact. Hearing, as noted, appeared intact with respect to basic perception, although this could not be confirmed directly. Language comprehension appeared relatively intact. He had no difficulty understanding instructions or questions generally, but in a few instances (2 or 3) he did appear to struggle slightly. Response latency was delayed, but he would reliably generate an answer when provided adequate time.  Thought Process/Content: Coherent, goal directed. No indications of psychosis.  Motor Problems: None Affect/Mood: Neutral, largely congruent. Some indications of slight dysphoria at times. Situationally appropraite.  Insight: Demonstrates awareness of cognitive difficulties. Behavior/Interactions: Behaviorally and socially appropriate.   SUMMARY / CLINICAL IMPRESSIONS  The patient is a 78 year old man was referred for neuropsychological evaluation to delineate his cognitive functioning within the context of a June 2024 left thalamic hemorrhage. Some mild chronic white matter changes noted in MRI consistent with his medical history. The patient was functionally intact prior the the stroke. He described mild cognitive changes in late life prior to stroke, without functional deficits, that could align with microvascular changes on imaging. The patient's physical symptoms recovered well following stroke, but persisting cognitive issues are noted. He is currently intact with basic activities of daily living. He receives some limited support with instrumental activities of  daily living. 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 He has no significant mental health concerns, past or present. He describes possible indications of adjustment difficulties associated with his word-finding. A formal neurocognitive evaluation would be beneficial in delineating his cognitive functioning and guiding treatment recommendations. Compensatory cognitive strategies, informed by the patient's cognitive profile, will be included in recommendations as was discussed with the patient and his wife during the interview. Similarly, as introduced during the interview, recommendations regarding adjusting to retirement and lifestyle recommendations to promote cognitive wellness will be reviewed in more depth following data collection.   DISPOSITION / PLAN   The patient has been set up for a formal neuropsychological assessment to objectively assess her cognitive functioning across domains to establish the patient's cognitive profile. This data, in conjunction with information obtained via clinical interview and medical record review, will help clarify likely etiology and guide treatment recommendations. Once data collection and interpretation have been completed, the findings / diagnosis and recommendations will be reviewed and discussed with the patient during a feedback appointment with the neuropsychologist. Based on the collaborative dialogue with the patient during the feedback, recommendations may be adjusted / tailored as needed. A formal report will be produced and provided to the patient and the referring provider.   Diagnosis: Cognitive deficit, post-stroke    This report was generated using voice recognition software. While this document has been carefully reviewed, transcription errors may be present. I apologize in advance for any  inconvenience. Please contact me if further clarification is needed.             Thelma Comp, PsyD             Neuropsychologist

## 2023-05-30 ENCOUNTER — Encounter

## 2023-06-03 ENCOUNTER — Encounter: Admitting: Psychology

## 2023-06-07 ENCOUNTER — Ambulatory Visit: Admitting: Psychology

## 2023-06-11 ENCOUNTER — Encounter: Admitting: Psychology

## 2023-06-11 ENCOUNTER — Encounter: Payer: Self-pay | Admitting: Family

## 2023-06-11 ENCOUNTER — Ambulatory Visit (INDEPENDENT_AMBULATORY_CARE_PROVIDER_SITE_OTHER): Admitting: Family

## 2023-06-11 VITALS — BP 122/86 | HR 80 | Temp 98.7°F | Ht 67.0 in | Wt 182.4 lb

## 2023-06-11 DIAGNOSIS — M62838 Other muscle spasm: Secondary | ICD-10-CM | POA: Diagnosis not present

## 2023-06-11 DIAGNOSIS — M545 Low back pain, unspecified: Secondary | ICD-10-CM | POA: Diagnosis not present

## 2023-06-11 LAB — POC URINALSYSI DIPSTICK (AUTOMATED)
Bilirubin, UA: NEGATIVE
Blood, UA: NEGATIVE
Glucose, UA: NEGATIVE
Ketones, UA: NEGATIVE
Leukocytes, UA: NEGATIVE
Nitrite, UA: NEGATIVE
Protein, UA: POSITIVE — AB
Spec Grav, UA: 1.025 (ref 1.010–1.025)
Urobilinogen, UA: 0.2 U/dL
pH, UA: 5.5 (ref 5.0–8.0)

## 2023-06-11 MED ORDER — TIZANIDINE HCL 4 MG PO TABS
ORAL_TABLET | ORAL | 0 refills | Status: AC
Start: 2023-06-11 — End: ?

## 2023-06-11 NOTE — Progress Notes (Signed)
 Established Patient Office Visit  Subjective:   Patient ID: Billy Kim, male    DOB: 03/05/1945  Age: 78 y.o. MRN: [id number]  CC:  Chief Complaint  Patient presents with   Acute Visit    Lower back pain after sitting or laying for a period of time. Would like a refill on tizanidine .    HPI: Billy Kim is a 78 y.o. male presenting on 06/11/2023 for Acute Visit (Lower back pain after sitting or laying for a period of time. Would like a refill on tizanidine .)  Severe back pain only notices in bed or trying to turn over however once he turns over and sits up on the bed and stand up the pain is relieved. He started to notice the symptoms about two weeks did not note any trauma or injury to his knowledge however wife states that he has been working in the yard and but he does not recall a specific moment where he felt a muscle pull or pop in his back. He states the pain is around the belt line in the lower back, midline, he denies radiation of pain upwards or downwards. He does state pain will radiate to the sides. He states at times sharp and stabbing and other times dull and achy. States possible muscle spasm. Has tried tylenol  with only mild relief. He did use some leftover tizanidine  but it was about four years ago so he did not take this.         ROS: Negative unless specifically indicated above in HPI.   Relevant past medical history reviewed and updated as indicated.   Allergies and medications reviewed and updated.   Current Outpatient Medications:    acetaminophen  (TYLENOL ) 325 MG tablet, Take 2 tablets (650 mg total) by mouth every 4 (four) hours as needed for mild pain (or temp > 37.5 C (99.5 F))., Disp: , Rfl:    Ascorbic Acid  (VITAMIN C) 1000 MG tablet, Take 1,000 mg by mouth daily., Disp: , Rfl:    cholecalciferol (VITAMIN D3) 25 MCG (1000 UNIT) tablet, Take 2 tablets (2,000 Units total) by mouth daily., Disp: 30 tablet, Rfl: 0   ezetimibe  (ZETIA ) 10 MG tablet, Take 1  tablet (10 mg total) by mouth daily., Disp: 90 tablet, Rfl: 3   famotidine  (PEPCID ) 20 MG tablet, Take 1 tablet (20 mg total) by mouth daily as needed for heartburn or indigestion., Disp: , Rfl:    fluticasone  (FLONASE ) 50 MCG/ACT nasal spray, Place 2 sprays into both nostrils daily., Disp: 16 g, Rfl: 0   Garlic  500 MG CAPS, Take 500 mg by mouth daily., Disp: , Rfl:    isosorbide  mononitrate (IMDUR ) 120 MG 24 hr tablet, Take 1 tablet (120 mg total) by mouth daily., Disp: 90 tablet, Rfl: 3   losartan  (COZAAR ) 100 MG tablet, Take 1 tablet (100 mg total) by mouth daily., Disp: 90 tablet, Rfl: 3   meclizine  (ANTIVERT ) 25 MG tablet, Take 0.5-1 tablet (12.5 mg- 25 mg) by mouth every 6 hours as needed for dizziness, Disp: 25 tablet, Rfl: 1   nitroGLYCERIN  (NITROSTAT ) 0.4 MG SL tablet, Place 1 tablet (0.4 mg total) under the tongue every 5 (five) minutes as needed for chest pain. Maximum of 3 doses., Disp: 25 tablet, Rfl: 3   Omega-3 Fatty Acids (FISH OIL ) 1000 MG CAPS, Take 1-2 capsules (1,000-2,000 mg total) by mouth daily., Disp: , Rfl:    omeprazole  (PRILOSEC) 20 MG capsule, Take 20 mg by mouth daily as needed.,  Disp: , Rfl:    tiZANidine  (ZANAFLEX ) 4 MG tablet, Take 1/2 to one tablet prn muscle spasm, Disp: 30 tablet, Rfl: 0  Allergies  Allergen Reactions   Amoxicillin Other (See Comments)    Caused headache Has patient had a PCN reaction causing immediate rash, facial/tongue/throat swelling, SOB or lightheadedness with hypotension: No Has patient had a PCN reaction causing severe rash involving mucus membranes or skin necrosis: No Has patient had a PCN reaction that required hospitalization: No Has patient had a PCN reaction occurring within the last 10 years: No If all of the above answers are "NO", then may proceed with Cephalosporin use.   Lyrica  [Pregabalin ]     Intolerant, worsening headache   Morphine Sulfate Other (See Comments)    headache   Neurontin  [Gabapentin ] Other (See Comments)     headache   Statins     myalgias   Sulfonamide Derivatives Other (See Comments)    headache    Objective:   BP 122/86 (BP Location: Right Arm, Patient Position: Sitting, Cuff Size: Large)   Pulse 80   Temp 98.7 F (37.1 C) (Temporal)   Ht 5\' 7"  (1.702 m)   Wt 182 lb 6.4 oz (82.7 kg)   SpO2 97%   BMI 28.57 kg/m    Physical Exam Musculoskeletal:     Lumbar back: Spasms present. No tenderness or bony tenderness. Normal range of motion. Negative right straight leg raise test and negative left straight leg raise test.     Assessment & Plan:  Muscle spasm -     tiZANidine  HCl; Take 1/2 to one tablet prn muscle spasm  Dispense: 30 tablet; Refill: 0  Acute midline low back pain without sciatica Assessment & Plan: Suspect overuse with muscle spasm and or arthritic inflammation Pt to take tyenol prn, voltaren gel prn, heat to site, and stretching exercises discussed If no improvement in the next few days f/u with PCP  Will order urine just to r/o any urinary process   Orders: -     POCT Urinalysis Dipstick (Automated)     Follow up plan: Return for f/u PCP if no improvement in symptoms.  Felicita Horns, FNP

## 2023-06-11 NOTE — Assessment & Plan Note (Signed)
 Suspect overuse with muscle spasm and or arthritic inflammation Pt to take tyenol prn, voltaren gel prn, heat to site, and stretching exercises discussed If no improvement in the next few days f/u with PCP  Will order urine just to r/o any urinary process

## 2023-06-14 ENCOUNTER — Encounter: Attending: Physical Medicine & Rehabilitation

## 2023-06-14 DIAGNOSIS — I69319 Unspecified symptoms and signs involving cognitive functions following cerebral infarction: Secondary | ICD-10-CM | POA: Insufficient documentation

## 2023-06-14 NOTE — Progress Notes (Signed)
 Behavioral Observations:  The patient was oriented to self, place, and most aspects of time, however, he missed the calendar date by one day. His hearing and vision were adequate for testing. He ambulated independently and without issue. No hand tremor was noted during testing. His speech was prosodic, fluent, and well-articulated. He displayed no clear indications of word-finding difficulties in conversational speech and no notable paraphasic errors were noted. Receptive language appeared intact. The patient's affect was congruent with mood, mood was largely neutral to positive. The patient was alert and participated in testing as instructed. He was cooperative throughout the session. His pace was steady. He showed no difficulties with frustration tolerance. Socially, the patient's interactions were unremarkable and consistent with the setting. No frank attentional lapses were appreciated.  Neuropsychology Note  Aldwin Zickefoose Gallier completed 150 minutes of neuropsychological testing with technician, Rhett Cella, BA, under the supervision of Aleene Hurry, PsyD., Clinical Neuropsychologist. The patient did not appear overtly distressed by the testing session, per behavioral observation or via self-report to the technician. Rest breaks were offered.   Clinical Decision Making: In considering the patient's current level of functioning, level of presumed impairment, nature of symptoms, emotional and behavioral responses during clinical interview, level of literacy, and observed level of motivation/effort, a battery of tests was selected by Dr. Georgeanne King during initial consultation on 05/08/2023. This was communicated to the technician. Communication between the neuropsychologist and technician was ongoing throughout the testing session and changes were made as deemed necessary based on patient performance on testing, technician observations and additional pertinent factors such as those listed above.  Tests  Administered: Automatic Data Edition (BNT-2) Boston Diagnostic Aphasia Examination-Third Edition (BDAE) Brief Visuospatial Memory Test-Revised (BVMT-R) Clock Drawing Test Controlled Oral Word Association Test (FAS & Animals) Delis-Kaplan Executive Function System (D-KEFS), select subtests Grooved Pegboard Test [employer] Verbal Learning Test - Revised (HVLT-R) Repeatable Battery for the Assessment of Neuropsychological Status Update (RBANS), select subtests Trail Making Test (TMT; Part A & B) Wechsler Adult Intelligence Scale-Fourth Edition (WAIS-IV), select subtests Wechsler Memory Scale-Fourth Edition (WMS-IV) , select subtests Wechsler Memory Scale-Third Edition (WMS-III), select subtests  Wechsler Test of Adult Reading (WTAR) Geriatric Depression Scale-Short Form (GDS-SF) Geriatric Anxiety Inventory (GAI)  Results: Note: This summary of test scores accompanies the interpretive report and should not be interpreted by unqualified individuals or in isolation without reference to the report. Test scores are relative to age, gender, and educational history as available and appropriate.   Measurement properties of test scores: IQ, Index, and Standard Scores (SS): Mean = 100; Standard Deviation = 15; Scaled Scores (ss): Mean = 10; Standard Deviation = 3; Z scores (Z): Mean = 0; Standard Deviation = 1; T scores (T); Mean = 50; Standard Deviation = 10  Intellectual/Premorbid Functioning Estimate   Norm Score Percentile  Range  Wechsler Test of Adult Reading  SS = 92 30 %ile Average   ATTENTION AND WORKING MEMORY    Norm Score Percentile  Range  WAIS-IV          Digit Span  ss = 11 63 %ile Average   DSF  ss = 10 50 %ile Average   Span:    7      DSB  ss = 10 50 %ile Average   Span:    4      DSS  ss = 10 50 %ile Average   Span:    5     WMS-III  Spatial Span  ss = 10 50 %ile Average   SSF  ss = 10 50 %ile Average   Span:    6      SSB  ss = 10 50 %ile Average   Span:     6      PROCESSING SPEED    Norm Score Percentile  Range   RBANS          Coding  ss = 4 2 %ile Below Average   PSYCHOMOTION    Norm Score Percentile  Range  Grooved Pegboard - Dominant  t = 40 16 %ile Low Average   # of drops    0     Grooved Pegboard - Nondominant  t = 39 13 %ile Low Average   # of drops    0      LANGUAGE    Norm Score Percentile Range  Boston Naming Test (BNT-2)  t = 42 21 %ile Low Average  COWAT          FAS  t = 28 2 %ile Exceptionally Low   Animals  t = 35 7 %ile Below Average  BDAE           Commands       WNL   Complex Ideational Material       WNL   Responsive Naming       WNL   EXECUTIVE FUNCTIONING    Norm Score Percentile  Range  DKEFS - Color-Word Interference          Color Naming  ss = 3 1 %ile Exceptionally Low   Word Reading  ss = 1 0.1 %ile Exceptionally Low   Inhibition  ss = N/a  %ile Discontinued   Errors  ss = N/a  %ile N/A  Trails A  t = 25 1 %ile Exceptionally Low   Errors    1     Trails B  t = 35 7 %ile Below Average   Errors    1      MEMORY    Norm Score Percentile  Range  BVMT-R          Trial 1  t = 42.0 21 %ile Low Average   Trial 2  t = 22 0.2 %ile Exceptionally Low   Trial 3  t = 35.0 7 %ile Below Average   Total Recall  t = 31.0 3 %ile Below Average   Learning  t = 41.0 18 %ile Low Average   Delayed Recall  t = 51.0 53 %ile Average   % Retained    160 >16 %ile WNL   Hits     >16 %ile WNL   False Alarms     >16 %ile WNL   Recognition Discriminability     >16 %ile WNL  HVLT          Total Recall  t = 29.0 2 %ile Exceptionally Low   Delayed Recall  t = 42 21 %ile Low Average   %Retention  t = 66.0 95 %ile Above Average   Recognition Discriminability  t = 37.0 9 %ile Low Average  Wechsler Memory Scale, 4th Edition (WMS-4)         Log. Mem. Immediate Recall  ss = 5 5 %ile Below Average   Logical Memory Delayed Recall  ss = 4 2 %ile Below Average   Logical Recognition    10th-16th  %ile Low Average    VISUAL-SPATIAL  Norm Score Percentile  Range  WAIS-IV          Block Design  ss = 9 37 %ile Average   Matrix Reasoning  ss = 8 25 %ile Average  Clock       Spatial Intact, Time Incorrect            RBANS           RBANS Figure Copy  ss = 7 16 %ile Low Average   RBANS Line Orientation      17th-25th %ile  Low Average-Average   PERSONALITY AND BEHAVIORAL FUNCTIONING      Score/Interpretation  GDS-SF Raw       1  GDS-SF Severity       Minimal.  GAI Raw       8  GAI Severity       Minimal.   Feedback to Patient: Jacobo Cepero Tom will return on 06/21/2023 for an interactive feedback session with Dr. Georgeanne King at which time his test performances, clinical impressions and treatment recommendations will be reviewed in detail. The patient understands he can contact our office should he require our assistance before this time.  150 minutes spent face-to-face with patient administering standardized tests, 30 minutes spent scoring Radiographer, therapeutic). [CPT [id number], [id number]]  Full report to follow.

## 2023-06-18 ENCOUNTER — Encounter: Admitting: Psychology

## 2023-06-18 DIAGNOSIS — I69319 Unspecified symptoms and signs involving cognitive functions following cerebral infarction: Secondary | ICD-10-CM

## 2023-06-21 ENCOUNTER — Encounter: Attending: Physical Medicine & Rehabilitation | Admitting: Psychology

## 2023-06-21 DIAGNOSIS — I69319 Unspecified symptoms and signs involving cognitive functions following cerebral infarction: Secondary | ICD-10-CM | POA: Diagnosis not present

## 2023-07-02 NOTE — Progress Notes (Signed)
 NEUROPSYCHOLOGICAL EVALUATION [place]  Physical Medicine and Rehabilitation     Patient: Billy Kim  MRN: [id number] DOB: January 17, 1946  Age: 78 y.o. Sex: male  Race/Ethnicity: White or Caucasian  Years of Education: 12 Handedness: Right  Collateral Information Source: Wife Engineer, maintenance)  Referring Provider: Genetta Kenning, MD   Provider/Clinical Neuropsychologist: Loletta Ripple, PsyD  Date of Service: 06/18/2023 Start Time: 11 AM End Time: 12 PM  Location of Service:  [facility] Physical Medicine & Rehabilitation Department [place]. [facility] [address] [place] [place], [place] [address] Phone: [phone]  Billing Code/Service: [phone]   Individuals Present: Aleene Hurry, PsyD  1 hour was spent on interpretation of patient data, interpretation of standardized test results and clinical data, clinical decision making, initial treatment planning/recommendations, and report writing. The report will be amended as needed based on any additional information collected during interactive feedback session.   REASON FOR REFERRAL The patient is a 78 year old man was referred for neuropsychological evaluation to delineate his cognitive functioning within the context of a June 2024 left thalamic hemorrhage. Per records, the presented to hospital with word finding difficulty and gait instability on 07/25/22. Cranial CT showed "acute intraparenchymal hemorrhage in the left thalamus measuring 1.8 x 1.4 x 1.2 cm. No midline shift or mass effect. CT angiogram of the head showed no aneurysm or vascular malformation. MRI follow-up no mass or unexpected findings." 07/26/22 MRI showed MRI (07/26/2022) revealed "known acute hemorrhage in the left thalamus measuring up to 2 cm and chronic small vessel ischemia with chronic microhemorrhages." The patient was admitted to rehab on 07/31/22 for inpatient therapies (PT, ST, and OT). Intraparenchymal hemorrhage remained  stable with conservative care per notes. Cognitively, ST reported anomic aphasia. Cognitive strengths reported in auditory verbal comprehension, repetition, and naming. He discharged home. He met with outpatient PM&R for follow-up visits and underwent outpatient rehab with PT, OT, and ST. Per outpatient ST discharge summary (01/09/23), "Pt has made progress over the course of skilled ST and has a result has met his goals. He continues to present with some deficits in memory that are related to baseline deficits but he is able to complete ADLs and iADLs independently." Per records from an outpatient PM&R follow-up, Dr. Sharl Davies indicated the patient has had notable improvement in motor symptoms but some mild balance deficits are reported by the patient. Cognitively, the patient was reported to continue to experience residual symptoms which may persist.   Upon interview, the patient and his wife expressed interest in learning about any ways in which to help improve the patient's cognitive difficulties. Decisions regarding employment have largely been made. 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 The patient indicated that he would like to be able to "a little" with respect to employment in the form of contract work servicing units.     HISTORY OF PRESENTING CONCERNS Premorbid Cognitive Functioning: The patient was reported to be independent with basic and instrumental activities of daily living prior to the stroke. 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 Prior to the stroke, the patient indicated he has minor difficulties with short term memory, attention, and processing speed. His wife indicated she observed no notable cognitive difficulties prior to the stroke. She indicated that he has "always been a slow thinker, taking  his time to  think about how to respond when talking." He denied any indications suggestive of neurodevelopmental disorders in early life.   Current Cognitive Complaints:  Memory:  Patient - Endorsed difficulty with remembering conversations, recent events, misplacing things, and slight forgetfulness with  remembering medications.  Collateral - Endorsed observing occasional (once or twice a week) difficulty remembering conversations and recent events. She denied observing problems with misplacing things, unintentionally repeating himself, or remembering medications.     Processing Speed:  Patient - Endorsed slightly slower processing speed.  Collateral - Consistent with patient report.   Attention & Concentration:  Patient - Endorsed problems primarily of losing train of thought and with working memory. No clear indications of problems in sustained attention generally.  Collateral - Reported some possible indications of problems with his attention, but this may be secondary to changes in language processing and slowed processing speed.   Language:  Patient - Endorsed difficulties with expressive language involving word-finding, and significant frustration with this issue. Described "some" trouble receptive language. Denied problems with reading. Endorsed some hearing difficulties, but hearing evaluation is pending. Denied other expressive language deficits.  Collateral - Endorsed observing problems with word-finding. Denied observing paraphasic word-use errors, changes in syntax, or changes in basic writing composition.    Visual-Spatial:  Patient - Denied difficulties.  Collateral - Denied observing problems. 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    Executive Functioning:  Patient - Denied any problems or significant changes. Collateral - Denied observing any problems with decision making, problem solving, planning,  organization, or changes in personality. Reported some reduced frustration tolerance with word-finding difficulties.    Motor/Sensory Complaints: The patient denied any problems or changes in sense of smell or taste. He has age related changes to vision but had a recent eye exam and updated his glasses. Wife and patient reported he is going to have his hearing tested in the future. The patient showed no clear indications of hearing loss in conversation during the interview (speaking softly, covered mouth to prevent lip reading). He denied problems with hearing related to reduced perception. The patient's balance was described as being 85% relative to his baseline, per his wife. One fall since since the stroke, but no injury. The reported dizziness upon starting a new heart medication. Patient and wife were encouraged to reach out to the prescribing physician if desired, given concern for fall.   Emotional and Behavioral Functioning: The patient denied any problems with frequent/difficulty to control worry, tension/restlessness, or other indications of anxiety. He denied problems with frequent feelings of low mood, anhedonia, suicidal ideation, or other symptoms of depression. He denied any current or past homicidal ideation, hallucination, paranoia, symptoms of mania, or other notable mental health problems. He denied any history of psychiatric symptoms earlier in life and has no mental health treatment history. He reported significant frustration with his word-finding difficulties and his wife added that he will frequently "give-up" and stop speaking when this occurs. They described some external stress related to the process of selling business assets to his son, something which started in December.   Sleep: He denied any problems with sleep onset. He occasionally has trouble getting back to sleep when waking during the night, but is not notably distressed by this. He sleeps around 9 hours per night, a  slight increase from his historical 7 to 8, but this is within the context of having ended full  time employment. No significant problems with fatigue were reported.  Appetite: "Good" Caffeine : Cappuccino (1 cup) 5x week.   Alcohol Use: None Tobacco Use: None  Recreational Substance Use: None   Level of Functional Independence: The patient is intact with basic activities of daily living.  Finances: Immediately following the stroke, his wife took over all book-keeping / Landscape architect of the business. Since then he has taking back over more responsibilities (writing checks, etc.) and his wife reported no major problems in this respect.    Shopping / Meal Preparation: Intact. Household Maintenance / Chores: Midwife / Future Obligations: Unchanged from baseline (managed by wife) Medication Management: Independent. Does not utilize pill-box. Reported minimal problems remembering medications.    Driving: Primarily drives with wife present. No significant problems noted.     Medical History/Record Review: HLD and HTN medication managed. Denied history of heart attack, cancer/chemotherapy, TBI, seizure, or OSA. Past Medical History:  Diagnosis Date   Coronary artery disease    a. 11/2019 Cath: Moderate proximal LAD disease (not hemodynamically significant) and CTO's of D1 and non-dominant RCA.   HLD (hyperlipidemia)    Hypertension    Intracerebral hemorrhage (HCC)    a. 07/2022 CT Head: acute intraparenchymal hemorrhage in the left thalamus without midline shift or mass effect.   Systolic murmur    a. 07/2022 Echo: EF 60-65%, no rwma, GrI DD, nl RV fxn, RVSP 15.8mmHg, no significant valvular disease.   Ulcerative colitis 06/1979   Remission for years   Patient Active Problem List   Diagnosis Date Noted   Statin myopathy 04/15/2023   Atypical chest pain 11/14/2022   Sinusitis 09/20/2022   Intraparenchymal hemorrhage of brain (HCC) 07/31/2022   AKI (acute kidney  injury) (HCC) 07/27/2022   Hypertensive emergency 07/27/2022   Nontraumatic subcortical hemorrhage of left cerebral hemisphere (HCC) 07/25/2022   Weakness 07/20/2022   Leg swelling 07/20/2022   Dizziness 11/03/2021   Actinic keratosis 08/07/2021   Melanocytic nevi of trunk 08/07/2021   Nevus of back 08/07/2021   Personal history of other malignant neoplasm of skin 08/07/2021   Medicare annual wellness visit, subsequent 11/09/2020   Coronary artery disease of native artery of native heart with stable angina pectoris (HCC) 12/17/2019   Hyperlipidemia LDL goal <70 12/17/2019   Accelerating angina (HCC) 12/11/2019   Shingles 03/01/2019   FH: prostate cancer 02/21/2017   Advance care planning 03/31/2014   PSA elevation 03/27/2013   Thrombocytopenia, unspecified (HCC) 03/27/2013   Cough 03/12/2013   ED (erectile dysfunction) 12/28/2011   Neoplasm of uncertain behavior of skin 12/27/2010   Exertional chest pain 01/05/2010   Essential hypertension 04/08/2007   HYPERTRIGLYCERIDEMIA 01/08/2007   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 FINDINGS: Brain: Acute intraparenchymal hemorrhage in the left thalamus measuring 1.8 x 1.4 x 1.2 cm (approximately 1.5 mL). Minimal edema. No midline shift or other mass effect. The size and configuration of the ventricles and extra-axial CSF spaces are normal. The brain parenchyma is normal, without evidence of acute or chronic infarction. ... IMPRESSION: 1. Acute intraparenchymal hemorrhage in the left thalamus measuring 1.8 x 1.4 x 1.2 cm (approximately 1.5 mL). 2. No midline shift or other mass effect."  07/25/22 "EXAM: CT ANGIOGRAPHY HEAD ... IMPRESSION: 1. Normal CTA of the head.  No aneurysm or vascular malformation. 2. Unchanged appearance of left thalamic intraparenchymal hematoma, most consistent with hypertensive  hemorrhage."   07/26/22 "EXAM: MRI HEAD WITHOUT AND WITH CONTRAST... COMPARISON: CT and CTA from yesterday.  ... FINDINGS: Brain: Known acute hemorrhage in the left thalamus measuring up to 2 cm. Faint rim of enhancement which is likely reactive, no spot sign or vascular lesion seen underlying the hematoma on recent CTA. No underlying masslike findings. There is a small rim of edema. No intraventricular extension.   Mild FLAIR hyperintensity in the cerebral white matter for age, attributed to chronic small vessel ischemia. There are chronic microhemorrhages primarily in the deep brain and best attributed to chronic hypertension. ... IMPRESSION: 1. No mass or other unexpected finding underlying the patient's left thalamic hematoma. 2. Chronic small vessel ischemia with chronic microhemorrhages."  07/27/22 "EXAM: CT HEAD WITHOUT CONTRAST... COMPARISON: CT head June 6, 24.... FINDINGS: Brain: Similar size of a left thalamic intraparenchymal hemorrhage. No progressive mass effect. No evidence of acute large vascular territory infarct, midline shift, or hydrocephalus. ... IMPRESSION: Similar size of a left thalamic intraparenchymal hemorrhage. No progressive mass effect."   Family Neurologic/Medical Hx: Denied family history of dementia.    Medications: (Per records) acetaminophen  (TYLENOL ) 325 MG tablet Ascorbic Acid  (VITAMIN C) 1000 MG tablet cholecalciferol (VITAMIN D3) 25 MCG (1000 UNIT) tablet ezetimibe  (ZETIA ) 10 MG tablet famotidine  (PEPCID ) 20 MG tablet fluticasone  (FLONASE ) 50 MCG/ACT nasal spray Garlic  500 MG CAPS isosorbide  mononitrate (IMDUR ) 120 MG 24 hr tablet losartan  (COZAAR ) 100 MG tablet meclizine  (ANTIVERT ) 25 MG tablet nitroGLYCERIN  (NITROSTAT ) 0.4 MG SL tablet Omega-3 Fatty Acids (FISH OIL ) 1000 MG CAPS  Educational/Vocational History: Graduated high school. Typically earned Bs and Cs. He denied any problems with learning, behavior, or  attention/concentration deficits. 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 His wife reported observing him trial some work since his stroke and that he performed very well. The patient and wife recognized that physically demanding work-tasks or ones with significant concern for safety would not be viable. As noted, he is selling business assets to his son who will effectively be taking over the family business.    Psychosocial: Marital Status: One prior marriage of 37 years. Married to current wife since 2010.  Children/Grandchildren: One son and three grandchildren.  Living Situation: Lives with wife.  Daily Activities/Hobbies: Remains involved in his church (head usher, Geophysicist/field seismologist super attendant, Biomedical engineer).   NEUROPSYCHODIAGNOSTIC FINDINGS: Behavioral Observations: The patient was oriented to self, place, and most aspects of time, however, he missed the calendar date by one day. His hearing and vision were adequate for testing. He ambulated independently and without issue. No hand tremor was noted during testing. His speech was prosodic, fluent, and well-articulated. He displayed no clear indications of word-finding difficulties in conversational speech and no notable paraphasic errors were noted. Receptive language appeared intact. The patient's affect was congruent with mood, mood was largely neutral to positive. The patient was alert and participated in testing as instructed. He was cooperative throughout the session. His pace was steady. He showed no difficulties with frustration tolerance. Socially, the patient's interactions were unremarkable and consistent with the setting. No frank attentional lapses were appreciated.  Tests Administered: Automatic Data Edition (BNT-2) Boston Diagnostic Aphasia Examination-Third  Edition (BDAE) Brief Visuospatial Memory Test-Revised (BVMT-R) Clock Drawing Test Controlled Oral Word Association Test (FAS & Animals) Delis-Kaplan Executive Function System (D-KEFS), select subtests  Grooved Pegboard Test [employer] Verbal Learning Test - Revised (HVLT-R) Repeatable Battery for the Assessment of Neuropsychological Status Update (RBANS), select subtests Trail Making Test (TMT; Part A & B) Wechsler Adult Intelligence Scale-Fourth Edition (WAIS-IV), select subtests Wechsler Memory Scale-Fourth Edition (WMS-IV) , select subtests Wechsler Memory Scale-Third Edition (WMS-III), select subtests  Wechsler Test of Adult Reading (WTAR) Geriatric Depression Scale-Short Form (GDS-SF) Geriatric Anxiety Inventory (GAI)  Results:  Intellectual/Premorbid Functioning Estimate   Norm Score Percentile  Range  Wechsler Test of Adult Reading  SS = 92 30 %ile Average  Premorbid cognitive abilities were estimated to be within the average range based on the patient's performance on a word reading/recognition measure and professional/academic history.   ATTENTION AND WORKING MEMORY    Norm Score Percentile  Range  WAIS-IV          Digit Span  ss = 11 63 %ile Average   DSF  ss = 10 50 %ile Average   Span:    7      DSB  ss = 10 50 %ile Average   Span:    4      DSS  ss = 10 50 %ile Average   Span:    5     WMS-III          Spatial Span  ss = 10 50 %ile Average   SSF  ss = 10 50 %ile Average   Span:    6      SSB  ss = 10 50 %ile Average   Span:    6     The patient's overall performance on a measure of auditory-verbal attention was average by age.  He performed consistently across component subtests which assessed basic span of auditory-verbal attention and working memory.  Similarly, he scored average overall on a visual attention test and, again, performed consistently across component subtest.   PROCESSING SPEED    Norm Score Percentile  Range  RBANS  Coding  ss = 4 2 %ile Below  Average  Processing speed with assessed to be within the below average range by age based on a rapid symbol digit translation task.   PSYCHOMOTION    Norm Score Percentile  Range  Grooved Pegboard - Dominant  t = 40 16 %ile Low Average   # of drops    0     Grooved Pegboard - Nondominant  t = 39 13 %ile Low Average   # of drops    0     Speeded motor dexterity was low average, bilaterally.   LANGUAGE    Norm Score Percentile Range  Boston Naming Test (BNT-2)  t = 42 21 %ile Low Average  COWAT          FAS  t = 28 2 %ile Exceptionally Low   Animals  t = 35 7 %ile Below Average  BDAE           Commands       WNL   Complex Ideational Material       WNL   Responsive Naming       WNL  The patient performed within normal limits on tasks assessing ability to follow multistep commands.  Responsive naming was intact.  He scored within normal limits on a task assessing higher level auditory comprehension which required him to grasp the overall meaning of complex verbal material.  The patient's performance on a confrontation-naming/word retrieval test was low average by age and education.  Within generative verbal fluency, the patient scored in the exceptionally low score range for phonemic verbal fluency and within the below average score range for semantic verbal fluency.  The difference was not statistically significant.  EXECUTIVE FUNCTIONING    Norm Score Percentile  Range  DKEFS - Color-Word Interference          Color Naming  ss = 3 1 %ile Exceptionally Low   Word Reading  ss = 1 0.1 %ile Exceptionally Low   Inhibition  ss = N/a  %ile Discontinued   Errors  ss = N/a  %ile N/A  Trails A  t = 25 1 %ile Exceptionally Low   Errors    1     Trails B  t = 35 7 %ile Below Average   Errors    1     Within the domain of executive functioning, the patient was unable to successfully complete the practice/learning trial of a basic response inhibition task.  The patient's speed was within the  exceptionally low score range by age and education on a sequencing task.  When set shifting was combined with sequencing, his speed was within the below average score range.  He had one error on both of these tasks.  It warrants mention that the patient was able to successfully complete this sequencing and sequencing/set-shifting tasks successfully which, given the duration required for task completion, reflects positively on the patient's ability to sustain attention over time effectively.   MEMORY    Norm Score Percentile  Range  BVMT-R          Trial 1  t = 42.0 21 %ile Low Average   Trial 2  t = 22 0.2 %ile Exceptionally Low   Trial 3  t = 35.0 7 %ile Below Average   Total Recall  t = 31.0 3 %ile Below Average   Learning  t = 41.0 18 %ile Low Average   Delayed Recall  t = 51.0 53 %ile Average   % Retained    160% >16 %ile WNL   Hits     >16 %ile WNL   False Alarms     >16 %ile WNL   Recognition Discriminability     >16 %ile WNL  HVLT          Total Recall  t = 29.0 2 %ile Exceptionally Low   Delayed Recall  t = 42 21 %ile Low Average   %Retention  t = 66.0 95 %ile Above Average   Recognition Discriminability  t = 37.0 9 %ile Low Average  Wechsler Memory Scale, 4th Edition (WMS-4)         Log. Mem. Immediate Recall  ss = 5 5 %ile Below Average   Logical Memory Delayed Recall  ss = 4 2 %ile Below Average   Logical Recognition    10th-16th  %ile Low Average  The patient completed one visual memory test and two auditory-verbal memory tests.  On the visual memory test, the patient scored within the below average range for total information learned across three learning trials.  Qualitative examination of his responses show evidence that the patient benefited with repetition across learning trials, which is captured in the low average range score with Learning.  The patient's delayed free recall was much stronger and scored in the average range.  In fact, his delayed free recall score was 160%  of his highest immediate recall score from the learning trial.  He performed within normal limits on  the delayed recognition task requiring him to discriminate between target and nontarget figures.  On a verbal memory test involving a word list, the patient's overall immediate recall across the three learning trials was scored in the exceptionally low score range.  Qualitatively, he did show a positive learning from trial to trial. His delayed free recall was scored in the low average range, but he again freely recalled an above average number of words relative to his highest learning trial performance. The patient's delayed recognition performance was low average by age, but his endorsements perfectly aligned with his encoding such that he correctly identified all words he originally learned and made not false-positive errors.   The patient had more difficulty on a pose memory test, which was presented verbally, with below average range scores on immediate recall and delayed free recall. However, he showed some improvement in recall on the recognition task. The patient's performance was likely impacted by the structure of this test in that repetition is limited; the first story is presented twice but the second story is presented only once.   VISUAL-SPATIAL    Norm Score Percentile  Range  WAIS-IV          Block Design  ss = 9 37 %ile Average   Matrix Reasoning  ss = 8 25 %ile Average  Clock       (See below)            RBANS           RBANS Figure Copy  ss = 7 16 %ile Low Average   RBANS Line Orientation      17th-25th  %ile Average to Low Average  The patient's performance on measures assessing visual-spatial abilities was largely in-line with premorbid estimates. He scored average on a timed visuospatial construction task requiring the patient to replicate a two-dimensional figure using three dimensional blocks. He also scored average on a measure of abstract nonverbal reasoning and problem  solving. His clock drawing was intact with respect to visual-spatial skills, but he designated the incorrect time. The patient's score on a basic figure-copy task was low average, primarily due to minor errors rather than gross visual-spatial impairment. He scored in the average to low average range on a visual perception task involving judgement of line orientation.   PERSONALITY AND BEHAVIORAL FUNCTIONING      Score/Interpretation  GDS-SF Raw       1  GDS-SF Severity       Minimal.  GAI Raw       8  GAI Severity       Minimal.  Measurement properties of test scores: IQ, Index, and Standard Scores (SS): Mean = 100; Standard Deviation = 15; Scaled Scores (ss): Mean = 10; Standard Deviation = 3; Z scores (Z): Mean = 0; Standard Deviation = 1; T scores (T); Mean = 50; Standard Deviation = 10   SUMMARY / CLINICAL IMPRESSIONS: The patient is a 78 year old man was referred for neuropsychological evaluation to delineate his cognitive functioning within the context of a June 2024 left thalamic hemorrhage. Some mild chronic white matter changes noted in MRI, consistent with his medical history. The patient was functionally intact prior the the stroke. He described mild cognitive changes in late life prior to stroke (without functional deficits) that could align with microvascular changes on imaging. The patient's physical symptoms recovered well following stroke, but cognitive changes have persisted. He is currently intact with basic activities of daily living and receives some limited support  with instrumental activities of daily living. 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 He has no significant mental health concerns, past or present. He describes possible indications of adjustment difficulties associated with his word-finding, but this seems to have  become less troublesome over time (even between the intake and feedback appointments).   The provider discussed the results of the evaluation in depth with the patient, accompanied by his wife, at the feedback appointment. As discussed, areas of difficulty seen on testing reflect significant changes in the patient and are likely linked to the neurologic injuries he has experienced. Findings are reported to align closely with the patient's self report and observations in his wife. While changes are likely to persist, the patient has areas of relative strength which can be leveraged and used to help improve functioning via compensatory strategies. Understanding areas of strength and weakness is therefore beneficial.   There are several areas of difficulty (and strength) but the most salient, from a practical perspective, is processing speed. The patient has had a decline in processing speed that is significant. Processing speed impacts use of other cognitive abilities and adjusting for reduced speed is therefore necessary from a functional perspective. There have been declines in memory abilities overall, the memory are likely to be less severe when adjusting for slowed speed. The patient is able to learn, retain, and remember new information but requires longer to process the information. He will benefit from information presented more slowly and/or when given opportunity for repeat exposure to the information (repetition). The patient's % retention of over 100 following a delay may reflect. Similarly, as discussed during feedback, structure is   (note % retention performances were greater than 100 on two memory tests). The patient's attention/concentration is an area of relative strength on testing, and this is very helpful in that it allows the patient to maintain focus when things take longer to process. From a compensatory strategy perspective, the patient's memory benefits from repetition and increased time  allotted in processing new information. When this happens, he shows an ability to remember things adequately.   Another area of change (post-injury) involves executive functioning. This domain involves more cognitively complex skills / demands. The patient has difficulties within this area. Speed is a factor in his scores here, but difficulties are also likely due to other neurological changes as well. From a practical perspective, the patient is likely to have more difficulty with cognitively complex tasks, particularly when speed is involved and the task is verbal (he was able to complete a set-shifting task that was more visually-based, albeit with extended time). Engaging in one task at a time, doing it to completion, is recommended. Shifting tasks or tracking multiple things at once will be harder. While the patient had difficulty on a response inhibition task, he does not show behavioral indications of impulsivity / disinhibition.   The patient has experienced changes in his word-finding and expressive language, something reported during interview and seen on testing. There looked to be clear improvements with respect to how the patient navigated communication difficulties even between the initial clinical intake appointment and the feedback appointment. While frustration is certainly understandable, frustration can exacerbate difficulties if allowed to "spiral" or cause one to "shut down." The patient showed notable improvement in this between the appointments, something which warrants praise. As discussed during  the feedback, the patient and his spouse are encouraged to communicate with each other in figuring out what works "best" (I.e., when to help, when to step back, etc.) in navigating these changes in verbal communications. Social connection / interaction is beneficial for cognitive and emotional wellness. This is particularly important for the transition from working full-time into retirement.    Another area of strength in testing involved visual-spatial abilities. The patient did well on most visual-spatial tasks, with the only significant area of difficulty being visual memory. He did well with visual-construction and even abstract nonverbal reasoning. 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 It warrants mention that, while visual-spatial abilities are an area of relative strength, driving should be approached with caution. Slowed processing speed is the primary concern with respect to driving. The writer ultimately defers to the referring physician with respect to recommendations around driving. Additionally, no test      Diagnosis: Cognitive deficit, post-stroke   Recommendations:   This report was generated using voice recognition software. While this document has been carefully reviewed, transcription errors may be present. I apologize in advance for any inconvenience. Please contact me if further clarification is needed.             Loletta Ripple, PsyD             Neuropsychologist

## 2023-07-03 NOTE — Progress Notes (Signed)
   NEUROPSYCHOLOGICAL EVALUATION [place]  Physical Medicine and Rehabilitation     Patient: Billy Kim  MRN: [id number] DOB: 03-Apr-1945   Service Provider/Clinical Neuropsychologist: Loletta Ripple, PsyD  Date of Service: 06/21/23 Start Time: 3 PM End Time: 4 PM  Location of Service:  [facility] Physical Medicine & Rehabilitation Department . [facility] [address] [place] [place], [place] [address] Phone: [phone]   Billing Code/Service: [phone]    Individuals present: Patient, patient's spouse, Provider Loletta Ripple, PsyD)  Provider conducted the 60-minute interactive feedback appointment in-person with the patient and his spouse, with his permission.  The provider reviewed and discussed the results of neuropsychological evaluation. Follow-up interviewing was conducted as needed to refine interpretation of findings as needed. Review of results included overall findings, diagnosis, and treatment planning/recommendations that were derived from integration of patient data, interpretation of standardized rest results and clinical data, and clinical decision making, which are documented in the patient's electronic medical record with the full report (date listed below). A copy of the full report will also be mailed to the patient.   The patient expressed understanding of the information reviewed. The patient was provided opportunity to ask questions which were then answered by the provider. The provider worked collaboratively to tailor treatment recommendations to the patient when possible. The patient was informed they could reach out to the provider should additional questions related to the evaluation arise.    The final neuropsychological evaluation report, documented in the patient's chart on 06/18/23, was amended to reflect any additional information obtained during the feedback appointment including treatment planning  collaboration.    This report was generated using voice recognition software. While this document has been carefully reviewed, transcription errors may be present. I apologize in advance for any inconvenience. Please contact me if further clarification is needed.             Loletta Ripple, PsyD             Neuropsychologist

## 2023-07-10 NOTE — Progress Notes (Signed)
 Cardiology Office Note:  .   Date:  07/13/2023  ID:  Billy Kim, DOB Apr 16, 1945, MRN [id number] PCP: Donnie Galea, MD  [facility] Providers Cardiologist:  Sammy Crisp, MD     History of Present Illness: .   Billy Kim is a 78 y.o. male with history of coronary artery disease being managed medically (chronic total occlusions of D1 and nondominant RCA as well as moderate proximal LAD disease that was not significant by iFR/FFR), hemorrhagic stroke (07/2022) hypertension, and ulcerative colitis, who presents for follow-up of coronary artery disease and hemorrhagic stroke.  I last saw him in February, at which time Billy Kim was feeling fairly well.  He noted occasional mild dependent edema in his legs.  He was still struggling with balance issues after his hemorrhagic stroke as well as some elevated blood pressure readings, usually in the mornings.  We agreed to increase losartan  to 100 mg daily and discontinue carvedilol  for improved blood pressure control in the setting of sinus bradycardia and first-degree AV block.  Follow-up BMP showed slight bump in creatinine with normal potassium; increased losartan  dose was continued with recommendation for increased fluid intake.  Today, Billy Kim reports that he had an episode of chest pain a few weeks ago.  He had been doing some strenuous work with his son earlier in the day and developed tightness in his chest that evening.  He took SL NTG x 2 and Tums with gradual resolution of the pain; it has not recurred, including with other strenuous activity.  He denies shortness of breath, palpitations, lightheadedness, and edema.  He continues to have difficulties with his speech and memory, with recovery having plateaued.  He continues to have sporadic vertigo but only has to use his "dizzy pill" about once every 3 months.  He sporadically checks his BP and notes that it is usually "pretty good."  Most recent home systolic reading was in the 130's.   He denies new focal neurologic deficits.  ROS: See HPI  Studies Reviewed: Aaron Aas   EKG Interpretation Date/Time:  Friday Jul 12 2023 08:22:14 EDT Ventricular Rate:  62 PR Interval:  196 QRS Duration:  126 QT Interval:  470 QTC Calculation: 477 R Axis:   -41  Text Interpretation: Sinus rhythm with Premature atrial complexes Left axis deviation Right bundle branch block When compared with ECG of 15-Apr-2023 10:03, Premature atrial complexes are now Present PR interval has decreased Confirmed by Emer Onnen [id number]) on 07/13/2023 4:32:08 PM    TTE (07/26/2022): Normal LV size and wall thickness. LVEF 60-65% with normal wall motion. Grade 1 diastolic dysfunction. Normal RV size and function. Normal biatrial size. No pericardial effusion. No significant valvular abnormality. Normal CVP.   Risk Assessment/Calculations:     HYPERTENSION CONTROL Vitals:   07/12/23 0818 07/12/23 0841  BP: (!) 148/82 (!) 138/90    The patient's blood pressure is elevated above target today.  In order to address the patient's elevated BP: A new medication was prescribed today.          Physical Exam:   VS:  BP (!) 138/90 (Cuff Size: Normal)   Pulse 62   Ht 5\' 7"  (1.702 m)   Wt 182 lb (82.6 kg)   SpO2 98%   BMI 28.51 kg/m    Wt Readings from Last 3 Encounters:  07/12/23 182 lb (82.6 kg)  06/11/23 182 lb 6.4 oz (82.7 kg)  04/15/23 187 lb 9.6 oz (85.1 kg)    General:  NAD. Neck: No JVD or HJR. Lungs: Clear to auscultation bilaterally without wheezes or crackles. Heart: Regular rate and rhythm with 1/6 systolic murmur. Abdomen: Soft, nontender, nondistended. Extremities: No lower extremity edema.  ASSESSMENT AND PLAN: .    Coronary artery disease and angina pectoris Billy Kim reports a single episode of chest pain that occurred at rest after having done more strenuous activity than usual earlier in the day.  He took NTG and Tums with resolution (he is not sure which medication provided the most  relief).  Given isolated episode with known two-vessel CAD, we have agreed to work on medical therapy rather than repeating ischemia testing.  I would be very reluctant to pursue cath/PCI given history of intracranial hemorrhage and risk for recurrent bleeding, were DAPT initiated.  We will add amlodipine 2.5 mg daily and continue current dose of isosorbide  mononitrate 120 mg daily.   Defer rechallenging with a beta-blocker given low-normal resting heart rate today and prior 1st-degree AV block.  Hemorrhagic stroke: No new neurologic deficits reported.  Importance of BP control reinforced; we will add amlodipine 2.5 mg daily today (see details below).  Continue ongoing follow-up with neurology; defer resuming aspirin  to the discretion of Dr. Janett Medin.  Hypertension: BP not optimally controlled.  We will continue current regimen of losartan  and isosorbide  mononitrate and add amlodipine 2.5 mg daily to target BP less than 130/80.  Hyperlipidemia and statin intolerance: Lipids at goal on last check.  Continue ezetimibe .  Consider stopping fish oil  if triglycerides remain normal at next lipid recheck.    Dispo: Return to clinic in 6-8 weeks  Signed, Sammy Crisp, MD

## 2023-07-12 ENCOUNTER — Ambulatory Visit: Payer: PPO | Attending: Internal Medicine | Admitting: Internal Medicine

## 2023-07-12 VITALS — BP 138/90 | HR 62 | Ht 67.0 in | Wt 182.0 lb

## 2023-07-12 DIAGNOSIS — I619 Nontraumatic intracerebral hemorrhage, unspecified: Secondary | ICD-10-CM

## 2023-07-12 DIAGNOSIS — E785 Hyperlipidemia, unspecified: Secondary | ICD-10-CM | POA: Diagnosis not present

## 2023-07-12 DIAGNOSIS — T466X5A Adverse effect of antihyperlipidemic and antiarteriosclerotic drugs, initial encounter: Secondary | ICD-10-CM | POA: Diagnosis not present

## 2023-07-12 DIAGNOSIS — I25118 Atherosclerotic heart disease of native coronary artery with other forms of angina pectoris: Secondary | ICD-10-CM

## 2023-07-12 DIAGNOSIS — I25119 Atherosclerotic heart disease of native coronary artery with unspecified angina pectoris: Secondary | ICD-10-CM

## 2023-07-12 DIAGNOSIS — I10 Essential (primary) hypertension: Secondary | ICD-10-CM | POA: Diagnosis not present

## 2023-07-12 DIAGNOSIS — G720 Drug-induced myopathy: Secondary | ICD-10-CM

## 2023-07-12 MED ORDER — AMLODIPINE BESYLATE 2.5 MG PO TABS: 2.5000 mg | ORAL_TABLET | Freq: Every day | ORAL | 3 refills | Status: DC

## 2023-07-12 NOTE — Patient Instructions (Signed)
 Medication Instructions:  Start taking Amlodipine 2.5 mg by mouth daily  *If you need a refill on your cardiac medications before your next appointment, please call your pharmacy*  Lab Work: No labs ordered today    Testing/Procedures: No test ordered today   Follow-Up: At [facility] you and your health needs are our priority.  As part of our continuing mission to provide you with exceptional heart care, our providers are all part of one team.  This team includes your primary Cardiologist (physician) and Advanced Practice Providers or APPs (Physician Assistants and Nurse Practitioners) who all work together to provide you with the care you need, when you need it.  Your next appointment:   6-8 week(s)  Provider:   You may see Sammy Crisp, MD or one of the following Advanced Practice Providers on your designated Care Team:   Laneta Pintos, NP Gildardo Labrador, PA-C Varney Gentleman, PA-C Cadence Loveland, PA-C Ronald Cockayne, NP Morey Ar, NP

## 2023-07-13 ENCOUNTER — Encounter: Payer: Self-pay | Admitting: Internal Medicine

## 2023-07-24 DIAGNOSIS — H2513 Age-related nuclear cataract, bilateral: Secondary | ICD-10-CM | POA: Diagnosis not present

## 2023-07-24 DIAGNOSIS — H353131 Nonexudative age-related macular degeneration, bilateral, early dry stage: Secondary | ICD-10-CM | POA: Diagnosis not present

## 2023-07-24 DIAGNOSIS — M3501 Sicca syndrome with keratoconjunctivitis: Secondary | ICD-10-CM | POA: Diagnosis not present

## 2023-08-08 DIAGNOSIS — R972 Elevated prostate specific antigen [PSA]: Secondary | ICD-10-CM | POA: Diagnosis not present

## 2023-08-08 DIAGNOSIS — Z8042 Family history of malignant neoplasm of prostate: Secondary | ICD-10-CM | POA: Diagnosis not present

## 2023-08-28 ENCOUNTER — Encounter: Payer: Self-pay | Admitting: Nurse Practitioner

## 2023-08-28 ENCOUNTER — Ambulatory Visit: Attending: Nurse Practitioner | Admitting: Nurse Practitioner

## 2023-08-28 VITALS — BP 102/60 | Ht 66.0 in | Wt 177.2 lb

## 2023-08-28 DIAGNOSIS — R5383 Other fatigue: Secondary | ICD-10-CM

## 2023-08-28 DIAGNOSIS — I619 Nontraumatic intracerebral hemorrhage, unspecified: Secondary | ICD-10-CM

## 2023-08-28 DIAGNOSIS — I25118 Atherosclerotic heart disease of native coronary artery with other forms of angina pectoris: Secondary | ICD-10-CM

## 2023-08-28 DIAGNOSIS — I25119 Atherosclerotic heart disease of native coronary artery with unspecified angina pectoris: Secondary | ICD-10-CM | POA: Diagnosis not present

## 2023-08-28 DIAGNOSIS — E785 Hyperlipidemia, unspecified: Secondary | ICD-10-CM

## 2023-08-28 DIAGNOSIS — I10 Essential (primary) hypertension: Secondary | ICD-10-CM | POA: Diagnosis not present

## 2023-08-28 DIAGNOSIS — N183 Chronic kidney disease, stage 3 unspecified: Secondary | ICD-10-CM | POA: Diagnosis not present

## 2023-08-28 NOTE — Patient Instructions (Signed)
 Medication Instructions:  The current medical regimen is effective;  continue present plan and medications as directed. Please refer to the Current Medication list given to you today.   *If you need a refill on your cardiac medications before your next appointment, please call your pharmacy*  Lab Work: CBC, BMET, TSH today  If you have labs (blood work) drawn today and your tests are completely normal, you will receive your results only by: MyChart Message (if you have MyChart) OR A paper copy in the mail If you have any lab test that is abnormal or we need to change your treatment, we will call you to review the results.   Follow-Up: At [facility], you and your health needs are our priority.  As part of our continuing mission to provide you with exceptional heart care, our providers are all part of one team.  This team includes your primary Cardiologist (physician) and Advanced Practice Providers or APPs (Physician Assistants and Nurse Practitioners) who all work together to provide you with the care you need, when you need it.  Your next appointment:   3 month(s)  Provider:   Lonni Hanson, MD or Lonni Meager, NP    We recommend signing up for the patient portal called MyChart.  Sign up information is provided on this After Visit Summary.  MyChart is used to connect with patients for Virtual Visits (Telemedicine).  Patients are able to view lab/test results, encounter notes, upcoming appointments, etc.  Non-urgent messages can be sent to your provider as well.   To learn more about what you can do with MyChart, go to [email or web address].

## 2023-08-28 NOTE — Progress Notes (Signed)
 Office Visit    Patient Name: Billy Kim Date of Encounter: 08/28/2023  Primary Care Provider:  Cleatus Arlyss RAMAN, MD Primary Cardiologist:  Lonni Hanson, MD  Cardiology APP:  Vivienne Lonni Ingle, NP   Chief Complaint    78 y.o. male with a history of CAD, hypertension, hyperlipidemia, ulcerative colitis, stage III chronic kidney disease, and intraparenchymal hemorrhage in the left thalamus, who presents for CAD follow-up.  Past Medical History   Subjective   Past Medical History:  Diagnosis Date   CKD (chronic kidney disease), stage III (HCC)    Coronary artery disease    a. 11/2019 Cath: Moderate proximal LAD disease (not hemodynamically significant) and CTO's of D1 and non-dominant RCA.   HLD (hyperlipidemia)    Hypertension    Intracerebral hemorrhage (HCC)    a. 07/2022 CT Head: acute intraparenchymal hemorrhage in the left thalamus without midline shift or mass effect.   Palpitations    a. 10/2022 Zio: Predominantly sinus rhythm, average 65 (49-112).  Rare PACs/PVCs.  4 beats SVT at 124.  No sustained arrhythmias/pauses.  Triggered events = sinus rhythm.   Systolic murmur    a. 07/2022 Echo: EF 60-65%, no rwma, GrI DD, nl RV fxn, RVSP 15.63mmHg, no significant valvular disease.   Ulcerative colitis 06/1979   Remission for years   Past Surgical History:  Procedure Laterality Date   CARDIAC CATHETERIZATION  03/20/01   Cardiolite EF 55% 02/10/02   CHOLECYSTECTOMY N/A 07/09/2017   Procedure: LAPAROSCOPIC CHOLECYSTECTOMY;  Surgeon: Kimble Agent, MD;  Location: [facility] OR;  Service: General;  Laterality: N/A;   COLONOSCOPY  multiple   ENDOSCOPIC RETROGRADE CHOLANGIOPANCREATOGRAPHY (ERCP) WITH PROPOFOL  N/A 07/08/2017   Procedure: ENDOSCOPIC RETROGRADE CHOLANGIOPANCREATOGRAPHY (ERCP) WITH PROPOFOL ;  Surgeon: Charlanne Groom, MD;  Location: [facility] ENDOSCOPY;  Service: Endoscopy;  Laterality: N/A;   INGUINAL HERNIA REPAIR  04/09/06   Bilateral   LAPAROSCOPIC APPENDECTOMY  03/1981    LEFT HEART CATH AND CORONARY ANGIOGRAPHY Left 12/11/2019   Procedure: LEFT HEART CATH AND CORONARY ANGIOGRAPHY;  Surgeon: Hanson Lonni, MD;  Location: [facility] INVASIVE CV LAB;  Service: Cardiovascular;  Laterality: Left;   REMOVAL OF STONES  07/08/2017   Procedure: REMOVAL OF STONES;  Surgeon: Charlanne Groom, MD;  Location: [facility] ENDOSCOPY;  Service: Endoscopy;;   SPHINCTEROTOMY  07/08/2017   Procedure: ANNETT;  Surgeon: Charlanne Groom, MD;  Location: [facility] ENDOSCOPY;  Service: Endoscopy;;    Allergies  Allergies  Allergen Reactions   Amoxicillin Other (See Comments)    Caused headache Has patient had a PCN reaction causing immediate rash, facial/tongue/throat swelling, SOB or lightheadedness with hypotension: No Has patient had a PCN reaction causing severe rash involving mucus membranes or skin necrosis: No Has patient had a PCN reaction that required hospitalization: No Has patient had a PCN reaction occurring within the last 10 years: No If all of the above answers are NO, then may proceed with Cephalosporin use.   Lyrica  [Pregabalin ]     Intolerant, worsening headache   Morphine Sulfate Other (See Comments)    headache   Neurontin  [Gabapentin ] Other (See Comments)    headache   Statins     myalgias   Sulfonamide Derivatives Other (See Comments)    headache       History of Present Illness      78 y.o. y/o male with the above past medical history including CAD, hypertension, hyperlipidemia, stage III chronic kidney disease, and ulcerative colitis.  He previously underwent diagnostic catheterization in July 2021  showing chronic total occlusions of the mid right coronary artery and first diagonal with otherwise moderate, nonobstructive disease involving the LAD and OM1.  EF was 65% by ventriculography, and he was medically managed.   Billy Kim was admitted to [facility] in June 2024 with gait instability and difficulty with word finding.  CT of the head showed acute  intraparenchymal hemorrhage in the left thalamus without midline shift or mass effect.  CT angiogram of the head showed no aneurysm or vascular malformation.  MRI showed no mass or unexpected findings.  He was seen by neurosurgery and neurology with recommendation for conservative care.  2D echocardiogram showed normal LV function with grade 1 diastolic dysfunction, and no significant valvular disease.  At cardiology visit in September 2024, Billy Kim reported 2 episodes of palpitations.  Subsequent event monitoring in September 2024 showed predominantly sinus rhythm with rare PACs/PVCs, and 1, 4 beat run of SVT.  Triggered events were associated with sinus rhythm.  In February 2025, beta-blocker therapy was discontinued in the setting of bradycardia and first-degree AV block.   Billy Kim was last seen in cardiology clinic in May 2025 at which time he reported a single episode of chest pain occurring several weeks prior.  He was hypertensive and amlodipine  2.5 mg daily was added.  Since then, his blood pressures at home have been trending in the 120's.  He has had some fatigue over the past 2 months, noting a reduction in desire to participate in the activities but then once he is out and doing something, he does just fine.  He and his wife went on vacation about a month ago and while sitting in a meeting, he had 2 very brief episodes of sharp, electrical shocklike chest discomfort lasting just a few seconds, resolving spontaneously.  There were no associated symptoms and he has not had any recurrent symptoms.  He remains active, walking for 30 minutes about 3 times a week without symptoms or limitations.  He denies palpitations, PND, orthopnea, dizziness, syncope, edema, or early satiety.  Blood pressure is low today at 102/60, though he is asymptomatic. Objective   Home Medications    Current Outpatient Medications  Medication Sig Dispense Refill   acetaminophen  (TYLENOL ) 325 MG tablet Take 2 tablets (650  mg total) by mouth every 4 (four) hours as needed for mild pain (or temp > 37.5 C (99.5 F)).     amLODipine  (NORVASC ) 2.5 MG tablet Take 1 tablet (2.5 mg total) by mouth daily. 90 tablet 3   Ascorbic Acid  (VITAMIN C) 1000 MG tablet Take 1,000 mg by mouth daily.     cholecalciferol (VITAMIN D3) 25 MCG (1000 UNIT) tablet Take 2 tablets (2,000 Units total) by mouth daily. 30 tablet 0   ezetimibe  (ZETIA ) 10 MG tablet Take 1 tablet (10 mg total) by mouth daily. 90 tablet 3   famotidine  (PEPCID ) 20 MG tablet Take 1 tablet (20 mg total) by mouth daily as needed for heartburn or indigestion.     Garlic  500 MG CAPS Take 500 mg by mouth daily.     isosorbide  mononitrate (IMDUR ) 120 MG 24 hr tablet Take 1 tablet (120 mg total) by mouth daily. 90 tablet 3   losartan  (COZAAR ) 100 MG tablet Take 1 tablet (100 mg total) by mouth daily. 90 tablet 3   meclizine  (ANTIVERT ) 25 MG tablet Take 0.5-1 tablet (12.5 mg- 25 mg) by mouth every 6 hours as needed for dizziness 25 tablet 1   nitroGLYCERIN  (NITROSTAT ) 0.4  MG SL tablet Place 1 tablet (0.4 mg total) under the tongue every 5 (five) minutes as needed for chest pain. Maximum of 3 doses. 25 tablet 3   Omega-3 Fatty Acids (FISH OIL ) 1000 MG CAPS Take 1-2 capsules (1,000-2,000 mg total) by mouth daily.     tiZANidine  (ZANAFLEX ) 4 MG tablet Take 1/2 to one tablet prn muscle spasm 30 tablet 0   No current facility-administered medications for this visit.     Physical Exam    VS:  BP 102/60   Ht 5' 6 (1.676 m)   Wt 177 lb 3.2 oz (80.4 kg)   SpO2 98%   BMI 28.60 kg/m  , BMI Body mass index is 28.6 kg/m.    Vitals:   08/28/23 0830 08/28/23 1331  BP: (!) 100/50 102/60  SpO2: 98%           GEN: Well nourished, well developed, in no acute distress. HEENT: normal. Neck: Supple, no JVD, carotid bruits, or masses. Cardiac: RRR, no murmurs, rubs, or gallops. No clubbing, cyanosis, edema.  Radials 2+/PT 2+ and equal bilaterally.  Respiratory:  Respirations  regular and unlabored, clear to auscultation bilaterally. GI: Soft, nontender, nondistended, BS + x 4. MS: no deformity or atrophy. Skin: warm and dry, no rash. Neuro:  Strength and sensation are intact. Psych: Normal affect.  Accessory Clinical Findings    ECG personally reviewed by me today - EKG Interpretation Date/Time:  Wednesday August 28 2023 08:36:30 EDT Ventricular Rate:  59 PR Interval:  198 QRS Duration:  124 QT Interval:  456 QTC Calculation: 451 R Axis:   -38  Text Interpretation: Sinus bradycardia Left axis deviation Right bundle branch block Confirmed by Vivienne Bruckner [phone]) on 08/28/2023 8:44:31 AM  - no acute changes.  Lab Results  Component Value Date   WBC 5.8 11/13/2022   HGB 14.5 11/13/2022   HCT 42.7 11/13/2022   MCV 88.0 11/13/2022   PLT 132.0 (L) 11/13/2022   Lab Results  Component Value Date   CREATININE 1.41 (H) 04/29/2023   BUN 13 04/29/2023   NA 143 04/29/2023   K 4.3 04/29/2023   CL 106 04/29/2023   CO2 22 04/29/2023   Lab Results  Component Value Date   ALT 16 11/13/2022   AST 22 11/13/2022   ALKPHOS 69 11/13/2022   BILITOT 2.3 (H) 11/13/2022   Lab Results  Component Value Date   CHOL 133 11/13/2022   HDL 32.70 (L) 11/13/2022   LDLCALC 59 11/13/2022   LDLDIRECT 62.0 11/08/2021   TRIG 207.0 (H) 11/13/2022   CHOLHDL 4 11/13/2022    Lab Results  Component Value Date   HGBA1C 5.3 07/26/2022   Lab Results  Component Value Date   TSH 4.81 11/13/2022       Assessment & Plan    1.  CAD/precordial pain: Long history of chest pain with moderate, nonobstructive LAD disease on catheterization 2021 with small vessel chronic total occlusions of the first diagonal and RCA.  Over the past several years, he is continue to have intermittent rest or exertional chest pain.  Most recently, he had 2 episodes of sharp and fleeting electrical shocklike chest pain while sitting, which resolved within a few seconds.  He has otherwise remained  active without symptoms or limitations.  We will continue medical therapy with calcium  channel blocker, long-acting nitrate, and Zetia .  No aspirin  in the setting of prior hemorrhagic stroke.  2.  Fatigue: Patient has noted some fatigue over the past 6  to weeks.  Overall less desire to participate in activities though once he participates, he has no symptoms or limitations.  Will follow-up CBC, basic metabolic panel, and TSH today.  3.  Primary hypertension: Pressure was elevated at last visit prompting initiation of amlodipine  2.5 mg daily.  Pressures at home have since been trending in the 120 range.  Pressure is lower today at 102/60.  He is asymptomatic.  Given stable pressures at home, it is unlikely that low pressure is resulting in fatigue.  Patient and wife will continue to follow blood pressures at home.  No medication changes today.  4.  Hyperlipidemia/hypertriglyceridemia: Statin intolerant due to myalgias.  He is on fish oil  and Zetia  with triglycerides of 207 and LDL of 62 last September.  5.  Stage III chronic kidney disease: Creatinine slightly elevated above prior baseline at 1.41 in March.  Following up labs today in the setting of fatigue.  He remains on ARB.  6.  History of hemorrhagic stroke: Diagnosed in June 2024.  Has recovered well.  Avoiding aspirin .  7.  Disposition: Follow-up CBC, basic metabolic panel, and TSH today.  Follow-up in clinic in 3 to 6 months or sooner if necessary.  Lonni Meager, NP 08/28/2023, 1:31 PM

## 2023-08-29 ENCOUNTER — Ambulatory Visit: Payer: Self-pay | Admitting: Nurse Practitioner

## 2023-08-29 LAB — CBC
Hematocrit: 43.1 % (ref 37.5–51.0)
Hemoglobin: 14.5 g/dL (ref 13.0–17.7)
MCH: 30.5 pg (ref 26.6–33.0)
MCHC: 33.6 g/dL (ref 31.5–35.7)
MCV: 91 fL (ref 79–97)
Platelets: 126 x10E3/uL — ABNORMAL LOW (ref 150–450)
RBC: 4.75 x10E6/uL (ref 4.14–5.80)
RDW: 13.4 % (ref 11.6–15.4)
WBC: 5.6 x10E3/uL (ref 3.4–10.8)

## 2023-08-29 LAB — BASIC METABOLIC PANEL WITH GFR
BUN/Creatinine Ratio: 13 (ref 10–24)
BUN: 21 mg/dL (ref 8–27)
CO2: 21 mmol/L (ref 20–29)
Calcium: 8.8 mg/dL (ref 8.6–10.2)
Chloride: 107 mmol/L — ABNORMAL HIGH (ref 96–106)
Creatinine, Ser: 1.6 mg/dL — ABNORMAL HIGH (ref 0.76–1.27)
Glucose: 102 mg/dL — ABNORMAL HIGH (ref 70–99)
Potassium: 4.1 mmol/L (ref 3.5–5.2)
Sodium: 143 mmol/L (ref 134–144)
eGFR: 44 mL/min/1.73 — ABNORMAL LOW (ref >59)

## 2023-08-29 LAB — TSH: TSH: 3.4 u[IU]/mL (ref 0.450–4.500)

## 2023-09-24 ENCOUNTER — Encounter: Payer: Self-pay | Admitting: Neurology

## 2023-09-24 ENCOUNTER — Ambulatory Visit: Payer: PPO | Admitting: Neurology

## 2023-09-24 VITALS — BP 122/73 | HR 57 | Ht 67.0 in | Wt 178.6 lb

## 2023-09-24 DIAGNOSIS — I69398 Other sequelae of cerebral infarction: Secondary | ICD-10-CM

## 2023-09-24 DIAGNOSIS — G3184 Mild cognitive impairment, so stated: Secondary | ICD-10-CM

## 2023-09-24 DIAGNOSIS — Z8673 Personal history of transient ischemic attack (TIA), and cerebral infarction without residual deficits: Secondary | ICD-10-CM

## 2023-09-24 DIAGNOSIS — R269 Unspecified abnormalities of gait and mobility: Secondary | ICD-10-CM

## 2023-09-24 NOTE — Progress Notes (Unsigned)
 [facility] [address] [place].  [address] [phone]       OFFICE FOLLOW-UP VISIT NOTE  [name] Date of Birth:  1945-08-06 Medical Record Number:  [id number]   Referring MD: Billy Kim  Reason for Referral: Thalamic hemorrhage  HPI: Initial visit 09/25/2022 Billy Kim is a 78 year old pleasant Caucasian male seen today for initial office consultation visit for intracerebral hemorrhage.  He is accompanied by his wife.  History is obtained from them and review of electronic medical records.  I personally reviewed pertinent available imaging films in PACS.  He has past medical history of hypertension, hyperlipidemia, coronary artery disease, ulcerative colitis.  He presented on 6/65/24 at [facility] with sudden onset of word finding and speech difficulties, garbled speech and gait and balance blood pressure was elevated on admission at 180/98.  CT scan of the head showed a 1.8 x 1.4 x 1.2 cm left thalamic parenchymal hemorrhage with volume of 1.5 mL.  ICH score was 0.  NIH stroke scale was 6.  Patient was admitted to the ICU and blood pressure is tightly controlled.  Brain hemorrhage showed slight increase in size on follow-up CT scan in a few hours but subsequent MRI scan showed stable appearance of the hemorrhage without any underlying mass lesion or intraventricular extension no hydrocephalus.  Echocardiogram showed normal ejection fraction.  LDL cholesterol is 41 mg percent.  Hemoglobin A1c was 5.3.  He subsequently had a carotid ultrasound on 08/28/2022 which showed only 1-39% bilateral carotid stenosis.  Patient was discharged home with outpatient speech and physical patient with therapies.  He states is done well.  He still has some mild expressive aphasia and word finding difficulties.  He is still doing outpatient speech therapy which seems to be helping.  He still has some minimal right-sided weakness and drags his leg while walking.   He is still doing outpatient physical and Occupational Therapy.  He is quite active at home and walks daily.  Wife is also noted some decrease in cognition and short-term memory difficulties since the brain hemorrhage but these are not progressive.  He still mostly independent in activities of daily living.  He has not been driving but wants to do so. Update 09/24/2023 : He returns for follow-up after last visit a year ago.  He states he is doing well.  He has had no recurrent stroke or TIA symptoms.  He still has some balance difficulties particularly when he first gets up in the morning but it gets better later on as the day goes on.  He has learned to hold onto his bed in the walls at times and is off balance.  Fortunately there are no falls or injuries.  He continues to have short-term memory difficulties which have gotten more obvious since his brain hemorrhage.  He is however independent in actives of daily living.  He is still working somewhat at an [employer].  He can drive.  He goes out.  He states his blood pressure is under very good control and today it is 122/73.  Lab work on 11/13/2022 showed LDL-cholesterol to be optimal at 59 mg percent.  Did have a 4-week outpatient cardiac monitor from November 01, 2022 to December 07, 2022 which was negative for any significant arrhythmias.  He has no new complaints today. ROS:   14 system review of systems is positive for memory loss, speech difficulty, off-balance slurred speech all other systems negative  PMH:  Past  Medical History:  Diagnosis Date   CKD (chronic kidney disease), stage III (HCC)    Coronary artery disease    a. 11/2019 Cath: Moderate proximal LAD disease (not hemodynamically significant) and CTO's of D1 and non-dominant RCA.   HLD (hyperlipidemia)    Hypertension    Intracerebral hemorrhage (HCC)    a. 07/2022 CT Head: acute intraparenchymal hemorrhage in the left thalamus without midline shift or mass effect.   Palpitations    a.  10/2022 Zio: Predominantly sinus rhythm, average 65 (49-112).  Rare PACs/PVCs.  4 beats SVT at 124.  No sustained arrhythmias/pauses.  Triggered events = sinus rhythm.   Systolic murmur    a. 07/2022 Echo: EF 60-65%, no rwma, GrI DD, nl RV fxn, RVSP 15.52mmHg, no significant valvular disease.   Ulcerative colitis 06/1979   Remission for years    Social History:  Social History   Socioeconomic History   Marital status: Married    Spouse name: Not on file   Number of children: 1   Years of education: Not on file   Highest education level: Not on file  Occupational History   Occupation: OWNER    Employer: SELF    Comment: [employer] business  Tobacco Use   Smoking status: Never   Smokeless tobacco: Never  Vaping Use   Vaping status: Never Used  Substance and Sexual Activity   Alcohol use: No   Drug use: No   Sexual activity: Never  Other Topics Concern   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   Social Drivers of Corporate investment banker Strain: Low Risk  (12/24/2022)   Overall Financial Resource Strain (CARDIA)    Difficulty of Paying Living Expenses: Not hard at all  Food Insecurity: Low Risk  (08/08/2023)   Received from [facility]   Hunger Vital Sign    Within the past 12 months, you worried that your food would run out before you got money to buy more: Never true    Within the past 12 months, the food you bought just didn't last and you didn't have money to get more. : Never true  Transportation Needs: No Transportation Needs (08/08/2023)   Received from [facility]    In the past 12 months, has lack of reliable transportation kept you from medical appointments, meetings, work or from getting things needed for daily living? : No  Physical Activity: Insufficiently Active (12/24/2022)   Exercise Vital Sign    Days of Exercise per Week: 1 day     Minutes of Exercise per Session: 30 min  Stress: No Stress Concern Present (12/24/2022)   [employer] of Occupational Health - Occupational Stress Questionnaire    Feeling of Stress : Not at all  Social Connections: Moderately Integrated (12/24/2022)   Social Connection and Isolation Panel    Frequency of Communication with Friends and Family: Three times a week    Frequency of Social Gatherings with Friends and Family: Three times a week    Attends Religious Services: More than 4 times per year    Active Member of Clubs or Organizations: No    Attends Banker Meetings: Never    Marital Status: Married  Catering manager Violence: Not At Risk (12/24/2022)   Humiliation, Afraid, Rape, and Kick questionnaire    Fear of Current or Ex-Partner: No  Emotionally Abused: No    Physically Abused: No    Sexually Abused: No    Medications:   Current Outpatient Medications on File Prior to Visit  Medication Sig Dispense Refill   acetaminophen  (TYLENOL ) 325 MG tablet Take 2 tablets (650 mg total) by mouth every 4 (four) hours as needed for mild pain (or temp > 37.5 C (99.5 F)).     amLODipine  (NORVASC ) 2.5 MG tablet Take 1 tablet (2.5 mg total) by mouth daily. 90 tablet 3   Ascorbic Acid  (VITAMIN C) 1000 MG tablet Take 1,000 mg by mouth daily.     cholecalciferol (VITAMIN D3) 25 MCG (1000 UNIT) tablet Take 2 tablets (2,000 Units total) by mouth daily. 30 tablet 0   ezetimibe  (ZETIA ) 10 MG tablet Take 1 tablet (10 mg total) by mouth daily. 90 tablet 3   famotidine  (PEPCID ) 20 MG tablet Take 1 tablet (20 mg total) by mouth daily as needed for heartburn or indigestion.     Garlic  500 MG CAPS Take 500 mg by mouth daily.     isosorbide  mononitrate (IMDUR ) 120 MG 24 hr tablet Take 1 tablet (120 mg total) by mouth daily. 90 tablet 3   losartan  (COZAAR ) 100 MG tablet Take 1 tablet (100 mg total) by mouth daily. 90 tablet 3   meclizine  (ANTIVERT ) 25 MG tablet Take 0.5-1 tablet (12.5 mg- 25  mg) by mouth every 6 hours as needed for dizziness 25 tablet 1   nitroGLYCERIN  (NITROSTAT ) 0.4 MG SL tablet Place 1 tablet (0.4 mg total) under the tongue every 5 (five) minutes as needed for chest pain. Maximum of 3 doses. 25 tablet 3   Omega-3 Fatty Acids (FISH OIL ) 1000 MG CAPS Take 1-2 capsules (1,000-2,000 mg total) by mouth daily.     tiZANidine  (ZANAFLEX ) 4 MG tablet Take 1/2 to one tablet prn muscle spasm 30 tablet 0   No current facility-administered medications on file prior to visit.    Allergies:   Allergies  Allergen Reactions   Amoxicillin Other (See Comments)    Caused headache Has patient had a PCN reaction causing immediate rash, facial/tongue/throat swelling, SOB or lightheadedness with hypotension: No Has patient had a PCN reaction causing severe rash involving mucus membranes or skin necrosis: No Has patient had a PCN reaction that required hospitalization: No Has patient had a PCN reaction occurring within the last 10 years: No If all of the above answers are NO, then may proceed with Cephalosporin use.   Lyrica  [Pregabalin ]     Intolerant, worsening headache   Morphine Sulfate Other (See Comments)    headache   Neurontin  [Gabapentin ] Other (See Comments)    headache   Statins     myalgias   Sulfonamide Derivatives Other (See Comments)    headache    Physical Exam General: well developed, well nourished, seated, in no evident distress Head: head normocephalic and atraumatic.   Neck: supple with no carotid or supraclavicular bruits Cardiovascular: regular rate and rhythm, no murmurs Musculoskeletal: no deformity Skin:  no rash/petichiae Vascular:  Normal pulses all extremities  Neurologic Exam Mental Status: Awake and fully alert. Oriented to place and time. Recent and remote memory intact. Attention span, concentration and fund of knowledge appropriate. Mood and affect appropriate.  Diminished recall 1/3.  Able to name 7 animals which can walk on 4 legs.   Clock drawing 4/4.  Speech is mostly fluent with occasional word hesitancy and word finding difficulties.  Good naming repetition and comprehension. Cranial Nerves: Fundoscopic  exam reveals sharp disc margins. Pupils equal, briskly reactive to light. Extraocular movements full without nystagmus. Visual fields full to confrontation. Hearing intact. Facial sensation intact. Face, tongue, palate moves normally and symmetrically.  Motor: Normal bulk and tone. Normal strength in all tested extremity muscles.  Diminished fine finger movements on the right.  Orbits left or right upper extremity. Sensory.: intact to touch , pinprick , position and vibratory sensation.  Coordination: Rapid alternating movements normal in all extremities. Finger-to-nose and heel-to-shin performed accurately bilaterally. Gait and Station: Arises from chair without difficulty. Stance is normal. Gait demonstrates normal stride length and balance but diminished right arm swing and slight dragging of the right leg.. Unable to heel, toe and tandem walk without difficulty.  Reflexes: 1+ and symmetric. Toes downgoing.   NIHSS  1 Modified Rankin  2   ASSESSMENT: 78 year old Caucasian male with hypertensive left thalamic hemorrhage and June 2024 who is doing quite well with only mild residual speech and cognitive difficulties.     PLAN: I had a long d/w patient and his wife about his recent thalamic hemorrhage, mild gait and cognitive difficulties, risk for recurrent stroke/TIAs, personally independently reviewed imaging studies and stroke evaluation results and answered questions.Continue strict control of hypertension with blood pressure goal below 130/90, diabetes with hemoglobin A1c goal below 6.5% and lipids with LDL cholesterol goal below 70 mg/dL. I also advised the patient to eat a healthy diet with plenty of whole grains, cereals, fruits and vegetables, exercise regularly and maintain ideal body weight .patient may start  driving a little initially but somebody to see if he can drive safely.  I also encouraged him to increase participation in cognitively challenging activities like solving crossword puzzles, playing bridge and sudoku.  We also discussed memory compensation strategies.    Followup in the future with only as needed or call earlier if necessary.  I personally spent a total of 35 minutes in the care of the patient today including getting/reviewing separately obtained history, performing a medically appropriate exam/evaluation, counseling and educating, placing orders, referring and communicating with other health care professionals, documenting clinical information in the EHR, independently interpreting results, and coordinating care.        Eather Popp, MD Note: This document was prepared with digital dictation and possible smart phrase technology. Any transcriptional errors that result from this process are unintentional.

## 2023-09-24 NOTE — Patient Instructions (Addendum)
 I had a long d/w patient and his wife about his recent thalamic hemorrhage, mild gait and cognitive difficulties, risk for recurrent stroke/TIAs, personally independently reviewed imaging studies and stroke evaluation results and answered questions.Continue strict control of hypertension with blood pressure goal below 130/90, diabetes with hemoglobin A1c goal below 6.5% and lipids with LDL cholesterol goal below 70 mg/dL. I also advised the patient to eat a healthy diet with plenty of whole grains, cereals, fruits and vegetables, exercise regularly and maintain ideal body weight .patient may start driving a little initially but somebody to see if he can drive safely.  I also encouraged him to increase participation in cognitively challenging activities like solving crossword puzzles, playing bridge and sudoku.  We also discussed memory compensation strategies.    Followup in the future with only as needed or call earlier if necessary.  Memory Compensation Strategies  Use WARM strategy.  W= write it down  A= associate it  R= repeat it  M= make a mental note  2.   You can keep a Glass blower/designer.  Use a 3-ring notebook with sections for the following: calendar, important names and phone numbers,  medications, doctors' names/phone numbers, lists/reminders, and a section to journal what you did  each day.   3.    Use a calendar to write appointments down.  4.    Write yourself a schedule for the day.  This can be placed on the calendar or in a separate section of the Memory Notebook.  Keeping a  regular schedule can help memory.  5.    Use medication organizer with sections for each day or morning/evening pills.  You may need help loading it  6.    Keep a basket, or pegboard by the door.  Place items that you need to take out with you in the basket or on the pegboard.  You may also want to  include a message board for reminders.  7.    Use sticky notes.  Place sticky notes with reminders in a  place where the task is performed.  For example:  turn off the  stove placed by the stove, lock the door placed on the door at eye level,  take your medications on  the bathroom mirror or by the place where you normally take your medications.  8.    Use alarms/timers.  Use while cooking to remind yourself to check on food or as a reminder to take your medicine, or as a  reminder to make a call, or as a reminder to perform another task, etc.

## 2023-10-30 ENCOUNTER — Telehealth: Payer: Self-pay | Admitting: Internal Medicine

## 2023-10-30 ENCOUNTER — Other Ambulatory Visit: Payer: Self-pay | Admitting: Nurse Practitioner

## 2023-10-30 MED ORDER — EZETIMIBE 10 MG PO TABS: 10.0000 mg | ORAL_TABLET | Freq: Every day | ORAL | 3 refills | Status: AC

## 2023-10-30 NOTE — Telephone Encounter (Signed)
 RX sent in

## 2023-10-30 NOTE — Telephone Encounter (Signed)
*  STAT* If patient is at the pharmacy, call can be transferred to refill team.   1. Which medications need to be refilled? (please list name of each medication and dose if known) ezetimibe  (ZETIA ) 10 MG tablet (Expired)  isosorbide  mononitrate (IMDUR ) 120 MG 24 hr tablet    2. Would you like to learn more about the convenience, safety, & potential cost savings by using the [facility]? No    3. Are you open to using the [facility] (Type [facility]. No   4. Which pharmacy/location (including street and city if local pharmacy) is medication to be sent to?[id number] - [place], [place] - [address]    5. Do they need a 30 day or 90 day supply? 90 day

## 2023-11-07 ENCOUNTER — Other Ambulatory Visit: Payer: PPO

## 2023-11-12 ENCOUNTER — Ambulatory Visit: Payer: PPO | Admitting: Psychology

## 2023-11-14 ENCOUNTER — Encounter: Payer: PPO | Admitting: Family Medicine

## 2023-11-17 ENCOUNTER — Other Ambulatory Visit: Payer: Self-pay | Admitting: Family Medicine

## 2023-11-17 DIAGNOSIS — I10 Essential (primary) hypertension: Secondary | ICD-10-CM

## 2023-11-18 ENCOUNTER — Other Ambulatory Visit (INDEPENDENT_AMBULATORY_CARE_PROVIDER_SITE_OTHER)

## 2023-11-18 ENCOUNTER — Ambulatory Visit: Payer: Self-pay | Admitting: Family Medicine

## 2023-11-18 DIAGNOSIS — I10 Essential (primary) hypertension: Secondary | ICD-10-CM

## 2023-11-18 LAB — COMPREHENSIVE METABOLIC PANEL WITH GFR
ALT: 19 U/L (ref 0–53)
AST: 21 U/L (ref 0–37)
Albumin: 4 g/dL (ref 3.5–5.2)
Alkaline Phosphatase: 66 U/L (ref 39–117)
BUN: 15 mg/dL (ref 6–23)
CO2: 29 meq/L (ref 19–32)
Calcium: 8.9 mg/dL (ref 8.4–10.5)
Chloride: 106 meq/L (ref 96–112)
Creatinine, Ser: 1.18 mg/dL (ref 0.40–1.50)
GFR: 58.67 mL/min — ABNORMAL LOW (ref >60.00)
Glucose, Bld: 96 mg/dL (ref 70–99)
Potassium: 3.8 meq/L (ref 3.5–5.1)
Sodium: 141 meq/L (ref 135–145)
Total Bilirubin: 1.9 mg/dL — ABNORMAL HIGH (ref 0.2–1.2)
Total Protein: 6.6 g/dL (ref 6.0–8.3)

## 2023-11-18 LAB — LIPID PANEL
Cholesterol: 127 mg/dL (ref 0–200)
HDL: 30 mg/dL — ABNORMAL LOW (ref >39.00)
LDL Cholesterol: 54 mg/dL (ref 0–99)
NonHDL: 97.46
Total CHOL/HDL Ratio: 4
Triglycerides: 216 mg/dL — ABNORMAL HIGH (ref 0.0–149.0)
VLDL: 43.2 mg/dL — ABNORMAL HIGH (ref 0.0–40.0)

## 2023-11-22 ENCOUNTER — Encounter: Payer: Self-pay | Admitting: Family Medicine

## 2023-11-22 ENCOUNTER — Ambulatory Visit: Admitting: Family Medicine

## 2023-11-22 VITALS — BP 128/72 | HR 60 | Temp 98.5°F | Ht 67.95 in | Wt 180.6 lb

## 2023-11-22 DIAGNOSIS — E785 Hyperlipidemia, unspecified: Secondary | ICD-10-CM | POA: Diagnosis not present

## 2023-11-22 DIAGNOSIS — D696 Thrombocytopenia, unspecified: Secondary | ICD-10-CM | POA: Diagnosis not present

## 2023-11-22 DIAGNOSIS — Z23 Encounter for immunization: Secondary | ICD-10-CM | POA: Diagnosis not present

## 2023-11-22 DIAGNOSIS — I10 Essential (primary) hypertension: Secondary | ICD-10-CM

## 2023-11-22 DIAGNOSIS — Z8673 Personal history of transient ischemic attack (TIA), and cerebral infarction without residual deficits: Secondary | ICD-10-CM | POA: Diagnosis not present

## 2023-11-22 DIAGNOSIS — Z0000 Encounter for general adult medical examination without abnormal findings: Secondary | ICD-10-CM

## 2023-11-22 DIAGNOSIS — Z7189 Other specified counseling: Secondary | ICD-10-CM

## 2023-11-22 MED ORDER — MECLIZINE HCL 25 MG PO TABS: ORAL_TABLET | ORAL | 1 refills | Status: AC

## 2023-11-22 NOTE — Patient Instructions (Signed)
 I would get a flu shot each fall.   Take care.  Glad to see you. Keep walking.  Update me as needed.

## 2023-11-22 NOTE — Progress Notes (Signed)
 Hypertension:    Using medication without problems or lightheadedness: yes Chest pain with exertion:no Edema: some occ BLE edema.  Short of breath: no  Elevated Cholesterol: Using medications without problems: yes Muscle aches: no Diet compliance: d/w pt.  Exercise: walking 1.5 miles per day, 5x/week.   TG improved from prior.    H/o chronic low platelet level w/o bleeding.    Flu 2024 Shingles discussed with patient PNA discussed with patient Tetanus 2008, discussed with patient. COVID-vaccine discussed with patient. RSV d/w pt.   Colonoscopy 2015. Prostate cancer screening 2025 Advance directive-wife designated if patient were incapacitated.  He had used meclizine  rarely, rx sent.    H/o CVA. No new events.  Speech is better compared to last year but still with some delay in initiation, some restarting of sentences.  Labs d/w pt.  Had neuro f/u.  He has trouble with name recall, esp if he hasn't seen the person recently.  He thought he had some improvement from prev nadir.  His driving improved.  No red flag events. 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    Meds, vitals, and allergies reviewed.   PMH and SH reviewed  ROS: Per HPI unless specifically indicated in ROS section   GEN: nad, alert and oriented HEENT: mucous membranes moist NECK: supple w/o LA CV: rrr. PULM: ctab, no inc wob ABD: soft, +bs EXT: no edema SKIN: well perfused.

## 2023-11-24 DIAGNOSIS — Z8673 Personal history of transient ischemic attack (TIA), and cerebral infarction without residual deficits: Secondary | ICD-10-CM | POA: Insufficient documentation

## 2023-11-24 DIAGNOSIS — Z0000 Encounter for general adult medical examination without abnormal findings: Secondary | ICD-10-CM | POA: Insufficient documentation

## 2023-11-24 NOTE — Assessment & Plan Note (Signed)
 Continue work on diet and exercise.  Continue amlodipine  isosorbide  losartan .

## 2023-11-24 NOTE — Assessment & Plan Note (Signed)
 H/o chronic low platelet level w/o bleeding.   No change in management at this point.  Labs discussed.

## 2023-11-24 NOTE — Assessment & Plan Note (Signed)
 Flu 2024 Shingles discussed with patient PNA discussed with patient Tetanus 2008, discussed with patient. COVID-vaccine discussed with patient. RSV d/w pt.   Colonoscopy 2015. Prostate cancer screening 2025 Advance directive-wife designated if patient were incapacitated.

## 2023-11-24 NOTE — Assessment & Plan Note (Signed)
 No new events.  Speech has improved some compared to last year.  No red flag memory events.  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  Continue with risk factor modification for secondary prevention.

## 2023-11-24 NOTE — Assessment & Plan Note (Signed)
 Advance directive- wife designated if patient were incapacitated.

## 2023-11-24 NOTE — Assessment & Plan Note (Signed)
 Continue work on diet and exercise.  Continue Zetia .

## 2023-11-25 DIAGNOSIS — R972 Elevated prostate specific antigen [PSA]: Secondary | ICD-10-CM | POA: Diagnosis not present

## 2023-11-27 NOTE — Telephone Encounter (Signed)
PSA results mailed to patient.

## 2023-11-28 ENCOUNTER — Encounter: Payer: Self-pay | Admitting: Nurse Practitioner

## 2023-11-28 ENCOUNTER — Ambulatory Visit: Attending: Nurse Practitioner | Admitting: Nurse Practitioner

## 2023-11-28 VITALS — BP 132/80 | HR 59 | Ht 68.0 in | Wt 182.2 lb

## 2023-11-28 DIAGNOSIS — N183 Chronic kidney disease, stage 3 unspecified: Secondary | ICD-10-CM | POA: Diagnosis not present

## 2023-11-28 DIAGNOSIS — I619 Nontraumatic intracerebral hemorrhage, unspecified: Secondary | ICD-10-CM | POA: Diagnosis not present

## 2023-11-28 DIAGNOSIS — I25118 Atherosclerotic heart disease of native coronary artery with other forms of angina pectoris: Secondary | ICD-10-CM

## 2023-11-28 DIAGNOSIS — I10 Essential (primary) hypertension: Secondary | ICD-10-CM

## 2023-11-28 DIAGNOSIS — E785 Hyperlipidemia, unspecified: Secondary | ICD-10-CM | POA: Diagnosis not present

## 2023-11-28 MED ORDER — AMLODIPINE BESYLATE 2.5 MG PO TABS: 2.5000 mg | ORAL_TABLET | Freq: Every day | ORAL | 3 refills | Status: AC

## 2023-11-28 NOTE — Patient Instructions (Signed)
 Medication Instructions:  Your physician recommends that you continue on your current medications as directed. Please refer to the Current Medication list given to you today.    *If you need a refill on your cardiac medications before your next appointment, please call your pharmacy*  Lab Work: No labs ordered today    Testing/Procedures: No test ordered today   Follow-Up: At [facility], you and your health needs are our priority.  As part of our continuing mission to provide you with exceptional heart care, our providers are all part of one team.  This team includes your primary Cardiologist (physician) and Advanced Practice Providers or APPs (Physician Assistants and Nurse Practitioners) who all work together to provide you with the care you need, when you need it.  Your next appointment:   6 month(s)  Provider:   Lonni Hanson, MD

## 2023-11-28 NOTE — Progress Notes (Signed)
 Office Visit    Patient Name: Billy Kim Date of Encounter: 11/28/2023  Primary Care Provider:  Cleatus Arlyss RAMAN, MD Primary Cardiologist:  Lonni Hanson, MD  Cardiology APP:  Vivienne Lonni Ingle, NP   Chief Complaint    78 y.o. male with history of CAD, hypertension, hyperlipidemia, ulcerative colitis, stage III chronic kidney disease, thrombocytopenia, and intraparenchymal hemorrhage in the left thalamus, who presents for CAD follow-up.  Past Medical History   Subjective   Past Medical History:  Diagnosis Date   CKD (chronic kidney disease), stage III (HCC)    Coronary artery disease    a. 11/2019 Cath: Moderate proximal LAD disease (not hemodynamically significant) and CTO's of D1 and non-dominant RCA.   HLD (hyperlipidemia)    Hypertension    Intracerebral hemorrhage (HCC)    a. 07/2022 CT Head: acute intraparenchymal hemorrhage in the left thalamus without midline shift or mass effect.   Palpitations    a. 10/2022 Zio: Predominantly sinus rhythm, average 65 (49-112).  Rare PACs/PVCs.  4 beats SVT at 124.  No sustained arrhythmias/pauses.  Triggered events = sinus rhythm.   Systolic murmur    a. 07/2022 Echo: EF 60-65%, no rwma, GrI DD, nl RV fxn, RVSP 15.35mmHg, no significant valvular disease.   Thrombocytopenia    Ulcerative colitis 06/1979   Remission for years   Past Surgical History:  Procedure Laterality Date   CARDIAC CATHETERIZATION  03/20/01   Cardiolite EF 55% 02/10/02   CHOLECYSTECTOMY N/A 07/09/2017   Procedure: LAPAROSCOPIC CHOLECYSTECTOMY;  Surgeon: Kimble Agent, MD;  Location: [facility] OR;  Service: General;  Laterality: N/A;   COLONOSCOPY  multiple   ENDOSCOPIC RETROGRADE CHOLANGIOPANCREATOGRAPHY (ERCP) WITH PROPOFOL  N/A 07/08/2017   Procedure: ENDOSCOPIC RETROGRADE CHOLANGIOPANCREATOGRAPHY (ERCP) WITH PROPOFOL ;  Surgeon: Charlanne Groom, MD;  Location: [facility] ENDOSCOPY;  Service: Endoscopy;  Laterality: N/A;   INGUINAL HERNIA REPAIR  04/09/06    Bilateral   LAPAROSCOPIC APPENDECTOMY  03/1981   LEFT HEART CATH AND CORONARY ANGIOGRAPHY Left 12/11/2019   Procedure: LEFT HEART CATH AND CORONARY ANGIOGRAPHY;  Surgeon: Hanson Lonni, MD;  Location: [facility] INVASIVE CV LAB;  Service: Cardiovascular;  Laterality: Left;   REMOVAL OF STONES  07/08/2017   Procedure: REMOVAL OF STONES;  Surgeon: Charlanne Groom, MD;  Location: [facility] ENDOSCOPY;  Service: Endoscopy;;   SPHINCTEROTOMY  07/08/2017   Procedure: ANNETT;  Surgeon: Charlanne Groom, MD;  Location: [facility]COPY;  Service: Endoscopy;;    Allergies  Allergies  Allergen Reactions   Amoxicillin Other (See Comments)    Caused headache Has patient had a PCN reaction causing immediate rash, facial/tongue/throat swelling, SOB or lightheadedness with hypotension: No Has patient had a PCN reaction causing severe rash involving mucus membranes or skin necrosis: No Has patient had a PCN reaction that required hospitalization: No Has patient had a PCN reaction occurring within the last 10 years: No If all of the above answers are NO, then may proceed with Cephalosporin use.   Lyrica  [Pregabalin ]     Intolerant, worsening headache   Morphine Sulfate Other (See Comments)    headache   Neurontin  [Gabapentin ] Other (See Comments)    headache   Statins     myalgias   Sulfonamide Derivatives Other (See Comments)    headache       History of Present Illness      78 y.o. y/o male with the above past medical history including CAD, hypertension, hyperlipidemia, stage III chronic kidney disease, thrombocytopenia, and ulcerative colitis.  He previously underwent  diagnostic catheterization in July 2021 showing chronic total occlusions of the mid right coronary artery and first diagonal with otherwise moderate, nonobstructive disease involving the LAD and OM1.  EF was 65% by ventriculography, and he was medically managed.   Billy Kim was admitted to [facility] in June 2024 with gait instability  and difficulty with word finding.  CT of the head showed acute intraparenchymal hemorrhage in the left thalamus without midline shift or mass effect.  CT angiogram of the head showed no aneurysm or vascular malformation.  MRI showed no mass or unexpected findings.  He was seen by neurosurgery and neurology with recommendation for conservative care.  2D echocardiogram showed normal LV function with grade 1 diastolic dysfunction, and no significant valvular disease.  At cardiology visit in September 2024, Billy Kim reported 2 episodes of palpitations.  Subsequent event monitoring in September 2024 showed predominantly sinus rhythm with rare PACs/PVCs, and 1, 4 beat run of SVT.  Triggered events were associated with sinus rhythm.  In February 2025, beta-blocker therapy was discontinued in the setting of bradycardia and first-degree AV block.     Billy Kim was last seen in cardiology clinic in July 2025 at which time he reported 2 very brief episodes of sharp chest discomfort that lasted just a few seconds and resolved spontaneously; he has not had any more episodes of this.  Denies any concerns today.  He has been increasing his frequency of exercise and now regularly walks 1.5 miles with his wife 5 times a week without symptoms or limitations.  Has adjusted diet to include mostly home-cooked foods and plenty of vegetables with avoidance of excess salt and sugar.  He denies chest pain, palpitations, dyspnea, pnd, orthopnea, n, v, dizziness, syncope, edema, weight gain, or early satiety.  Blood pressure today is 132/80; systolic in 120s when checked blood pressure at home.  Objective   Home Medications    Current Outpatient Medications  Medication Sig Dispense Refill   acetaminophen  (TYLENOL ) 325 MG tablet Take 2 tablets (650 mg total) by mouth every 4 (four) hours as needed for mild pain (or temp > 37.5 C (99.5 F)).     Ascorbic Acid  (VITAMIN C) 1000 MG tablet Take 1,000 mg by mouth daily.      cholecalciferol (VITAMIN D3) 25 MCG (1000 UNIT) tablet Take 2 tablets (2,000 Units total) by mouth daily. 30 tablet 0   ezetimibe  (ZETIA ) 10 MG tablet Take 1 tablet (10 mg total) by mouth daily. 90 tablet 3   famotidine  (PEPCID ) 20 MG tablet Take 1 tablet (20 mg total) by mouth daily as needed for heartburn or indigestion.     Garlic  500 MG CAPS Take 500 mg by mouth daily.     isosorbide  mononitrate (IMDUR ) 120 MG 24 hr tablet TAKE 1 TABLET BY MOUTH DAILY 90 tablet 3   losartan  (COZAAR ) 100 MG tablet Take 1 tablet (100 mg total) by mouth daily. 90 tablet 3   meclizine  (ANTIVERT ) 25 MG tablet Take 0.5-1 tablet (12.5 mg- 25 mg) by mouth every 6 hours as needed for dizziness 25 tablet 1   nitroGLYCERIN  (NITROSTAT ) 0.4 MG SL tablet Place 1 tablet (0.4 mg total) under the tongue every 5 (five) minutes as needed for chest pain. Maximum of 3 doses. 25 tablet 3   Omega-3 Fatty Acids (FISH OIL ) 1000 MG CAPS Take 1-2 capsules (1,000-2,000 mg total) by mouth daily.     tiZANidine  (ZANAFLEX ) 4 MG tablet Take 1/2 to one tablet prn muscle spasm  30 tablet 0   amLODipine  (NORVASC ) 2.5 MG tablet Take 1 tablet (2.5 mg total) by mouth daily. 90 tablet 3   No current facility-administered medications for this visit.     Physical Exam    VS:  BP 132/80   Pulse (!) 59   Ht 5' 8 (1.727 m)   Wt 182 lb 3.2 oz (82.6 kg)   SpO2 99%   BMI 27.70 kg/m  , BMI Body mass index is 27.7 kg/m.    Vitals:   11/28/23 1007 11/28/23 1155  BP: (!) 140/80 132/80  Pulse: (!) 59   SpO2: 99%           GEN: Well nourished, well developed, in no acute distress. HEENT: normal. Neck: Supple, no JVD, carotid bruits, or masses. Cardiac: RRR, no rubs or gallops. Grade 1/6 systolic murmur best heard at RUSB.  No clubbing, cyanosis, edema.  Radials 2+/PT 2+ and equal bilaterally.  Respiratory:  Respirations regular and unlabored, clear to auscultation bilaterally. GI: Soft, nontender, nondistended, BS + x 4. MS: no deformity or  atrophy. Skin: warm and dry, no rash. Neuro:  Strength and sensation are intact. Psych: Normal affect.  Accessory Clinical Findings    ECG personally reviewed by me today - EKG Interpretation Date/Time:  Thursday November 28 2023 10:14:56 EDT Ventricular Rate:  59 PR Interval:  194 QRS Duration:  124 QT Interval:  466 QTC Calculation: 461 R Axis:   -34  Text Interpretation: Sinus bradycardia Left axis deviation Right bundle branch block Confirmed by Vivienne Bruckner [phone]) on 11/28/2023 10:17:57 AM   - no acute changes.  Lab Results  Component Value Date   WBC 5.6 08/28/2023   HGB 14.5 08/28/2023   HCT 43.1 08/28/2023   MCV 91 08/28/2023   PLT 126 (L) 08/28/2023   Lab Results  Component Value Date   CREATININE 1.18 11/18/2023   BUN 15 11/18/2023   NA 141 11/18/2023   K 3.8 11/18/2023   CL 106 11/18/2023   CO2 29 11/18/2023   Lab Results  Component Value Date   ALT 19 11/18/2023   AST 21 11/18/2023   ALKPHOS 66 11/18/2023   BILITOT 1.9 (H) 11/18/2023   Lab Results  Component Value Date   CHOL 127 11/18/2023   HDL 30.00 (L) 11/18/2023   LDLCALC 54 11/18/2023   LDLDIRECT 62.0 11/08/2021   TRIG 216.0 (H) 11/18/2023   CHOLHDL 4 11/18/2023    Lab Results  Component Value Date   HGBA1C 5.3 07/26/2022   Lab Results  Component Value Date   TSH 3.400 08/28/2023       Assessment & Plan    1.  CAD/precordial pain - Long history of chest pain with moderate, nonobstructive LAD disease on catheterization 2021 with small vessel chronic total occlusions of the first diagonal and RCA - History of intermittent rest or exertional chest pain, but none since last visit despite regular exercise. - Medical therapy consists of calcium  channel blocker, long-acting nitrate, and Zetia .  No aspirin  in the setting of prior hemorrhagic stroke - Encouraged continuation of regular exercise and healthy eating  2.  Primary hypertension -Pressures at home have been trending in the  120 range.  Pressure today is 132/80 -Medical therapy consists of amlodipine , losartan , and Imdur  -Patient and wife will continue to follow blood pressures at home - No medication changes today  3.  Hyperlipidemia/hypertriglyceridemia -Statin intolerant due to myalgias.   - Currently on fish oil  and Zetia  - LDL 54  and triglycerides 216 in September 2025  4.  Stage III chronic kidney disease  -Creatinine of 1.18 in September 2025, down from 1.60 in July -Cont ARB  5.  History of hemorrhagic stroke -Diagnosed in June 2024 -Has recovered well -Avoiding aspirin   6.  Disposition -Follow-up in clinic in 6 months or sooner if necessary  Lonni Meager, NP 11/28/2023, 11:55 AM

## 2023-12-17 ENCOUNTER — Encounter

## 2024-02-17 ENCOUNTER — Other Ambulatory Visit: Payer: Self-pay | Admitting: Internal Medicine

## 2024-03-06 ENCOUNTER — Ambulatory Visit

## 2024-03-16 ENCOUNTER — Ambulatory Visit: Admitting: General Practice

## 2024-03-19 ENCOUNTER — Ambulatory Visit: Admitting: Dermatology

## 2024-03-30 ENCOUNTER — Ambulatory Visit: Admitting: Dermatology

## 2024-05-11 ENCOUNTER — Ambulatory Visit

## 2024-05-27 ENCOUNTER — Ambulatory Visit: Admitting: Internal Medicine

## 2024-11-23 ENCOUNTER — Other Ambulatory Visit

## 2024-11-27 ENCOUNTER — Encounter: Admitting: Family Medicine
